# Patient Record
Sex: Female | Born: 1980
Health system: Southern US, Community
[De-identification: ages and names within clinical notes are randomized; demographics above are authoritative.]

## PROBLEM LIST (undated history)

## (undated) DIAGNOSIS — R569 Unspecified convulsions: Secondary | ICD-10-CM

## (undated) DIAGNOSIS — I10 Essential (primary) hypertension: Secondary | ICD-10-CM

## (undated) DIAGNOSIS — G8929 Other chronic pain: Secondary | ICD-10-CM

## (undated) DIAGNOSIS — E039 Hypothyroidism, unspecified: Secondary | ICD-10-CM

## (undated) DIAGNOSIS — G4730 Sleep apnea, unspecified: Secondary | ICD-10-CM

## (undated) DIAGNOSIS — M1990 Unspecified osteoarthritis, unspecified site: Secondary | ICD-10-CM

## (undated) DIAGNOSIS — R002 Palpitations: Secondary | ICD-10-CM

## (undated) DIAGNOSIS — F32A Depression, unspecified: Secondary | ICD-10-CM

## (undated) DIAGNOSIS — M25562 Pain in left knee: Secondary | ICD-10-CM

## (undated) DIAGNOSIS — Z8489 Family history of other specified conditions: Secondary | ICD-10-CM

## (undated) DIAGNOSIS — J302 Other seasonal allergic rhinitis: Secondary | ICD-10-CM

## (undated) DIAGNOSIS — Z9889 Other specified postprocedural states: Secondary | ICD-10-CM

## (undated) DIAGNOSIS — E282 Polycystic ovarian syndrome: Secondary | ICD-10-CM

## (undated) DIAGNOSIS — E119 Type 2 diabetes mellitus without complications: Secondary | ICD-10-CM

## (undated) DIAGNOSIS — K829 Disease of gallbladder, unspecified: Secondary | ICD-10-CM

## (undated) DIAGNOSIS — E559 Vitamin D deficiency, unspecified: Secondary | ICD-10-CM

## (undated) DIAGNOSIS — R112 Nausea with vomiting, unspecified: Secondary | ICD-10-CM

## (undated) DIAGNOSIS — K219 Gastro-esophageal reflux disease without esophagitis: Secondary | ICD-10-CM

## (undated) DIAGNOSIS — F419 Anxiety disorder, unspecified: Secondary | ICD-10-CM

## (undated) DIAGNOSIS — M2550 Pain in unspecified joint: Secondary | ICD-10-CM

## (undated) DIAGNOSIS — M549 Dorsalgia, unspecified: Secondary | ICD-10-CM

## (undated) DIAGNOSIS — E669 Obesity, unspecified: Secondary | ICD-10-CM

## (undated) DIAGNOSIS — F329 Major depressive disorder, single episode, unspecified: Secondary | ICD-10-CM

## (undated) DIAGNOSIS — M109 Gout, unspecified: Secondary | ICD-10-CM

## (undated) HISTORY — PX: ESOPHAGOGASTRODUODENOSCOPY: SHX1529

## (undated) HISTORY — DX: Other chronic pain: G89.29

## (undated) HISTORY — DX: Pain in left knee: M25.562

## (undated) HISTORY — PX: CHOLECYSTECTOMY: SHX55

## (undated) HISTORY — DX: Disease of gallbladder, unspecified: K82.9

## (undated) HISTORY — DX: Vitamin D deficiency, unspecified: E55.9

## (undated) HISTORY — DX: Unspecified osteoarthritis, unspecified site: M19.90

## (undated) HISTORY — DX: Pain in unspecified joint: M25.50

## (undated) HISTORY — DX: Dorsalgia, unspecified: M54.9

---

## 2012-09-10 HISTORY — PX: GANGLION CYST EXCISION: SHX1691

## 2013-08-21 DIAGNOSIS — M19072 Primary osteoarthritis, left ankle and foot: Secondary | ICD-10-CM | POA: Insufficient documentation

## 2013-09-16 DIAGNOSIS — G4700 Insomnia, unspecified: Secondary | ICD-10-CM | POA: Insufficient documentation

## 2013-11-05 DIAGNOSIS — G4721 Circadian rhythm sleep disorder, delayed sleep phase type: Secondary | ICD-10-CM | POA: Insufficient documentation

## 2013-11-30 DIAGNOSIS — G2581 Restless legs syndrome: Secondary | ICD-10-CM | POA: Insufficient documentation

## 2014-09-17 DIAGNOSIS — R1314 Dysphagia, pharyngoesophageal phase: Secondary | ICD-10-CM | POA: Insufficient documentation

## 2014-09-17 DIAGNOSIS — R140 Abdominal distension (gaseous): Secondary | ICD-10-CM | POA: Insufficient documentation

## 2014-09-17 DIAGNOSIS — R194 Change in bowel habit: Secondary | ICD-10-CM | POA: Insufficient documentation

## 2014-09-17 DIAGNOSIS — K219 Gastro-esophageal reflux disease without esophagitis: Secondary | ICD-10-CM | POA: Insufficient documentation

## 2014-09-29 DIAGNOSIS — Z3400 Encounter for supervision of normal first pregnancy, unspecified trimester: Secondary | ICD-10-CM | POA: Insufficient documentation

## 2015-05-27 DIAGNOSIS — Z3490 Encounter for supervision of normal pregnancy, unspecified, unspecified trimester: Secondary | ICD-10-CM | POA: Insufficient documentation

## 2016-09-26 DIAGNOSIS — F32A Depression, unspecified: Secondary | ICD-10-CM | POA: Insufficient documentation

## 2016-11-05 DIAGNOSIS — F509 Eating disorder, unspecified: Secondary | ICD-10-CM | POA: Insufficient documentation

## 2016-12-13 DIAGNOSIS — G4733 Obstructive sleep apnea (adult) (pediatric): Secondary | ICD-10-CM | POA: Insufficient documentation

## 2016-12-13 DIAGNOSIS — G4761 Periodic limb movement disorder: Secondary | ICD-10-CM | POA: Insufficient documentation

## 2017-03-24 ENCOUNTER — Emergency Department
Admission: EM | Admit: 2017-03-24 | Discharge: 2017-03-24 | Disposition: A | Discharge status: Home / Self Care | Payer: 59 | Source: Home / Self Care | Attending: Family Medicine | Admitting: Family Medicine

## 2017-03-24 ENCOUNTER — Encounter: Payer: Self-pay | Admitting: Emergency Medicine

## 2017-03-24 DIAGNOSIS — M79675 Pain in left toe(s): Secondary | ICD-10-CM

## 2017-03-24 HISTORY — DX: Depression, unspecified: F32.A

## 2017-03-24 HISTORY — DX: Anxiety disorder, unspecified: F41.9

## 2017-03-24 HISTORY — DX: Essential (primary) hypertension: I10

## 2017-03-24 HISTORY — DX: Obesity, unspecified: E66.9

## 2017-03-24 HISTORY — DX: Major depressive disorder, single episode, unspecified: F32.9

## 2017-03-24 HISTORY — DX: Type 2 diabetes mellitus without complications: E11.9

## 2017-03-24 MED ORDER — COLCHICINE 0.6 MG PO TABS: ORAL_TABLET | ORAL | 0 refills | Status: DC

## 2017-03-24 NOTE — Discharge Instructions (Signed)
°  You may continue to take ibuprofen (Advil or Motrin) or naproxen (Aleve) as needed for pain along with the colchicine.

## 2017-03-24 NOTE — ED Triage Notes (Signed)
Patient presents to [facility] with C/O pain and redness with slight edema in the left great toe. Since last PM, denies injury.

## 2017-03-24 NOTE — ED Provider Notes (Signed)
CSN: [id number]     Arrival date & time 03/24/17  1124 History   First MD Initiated Contact with Patient 03/24/17 1159     Chief Complaint  Patient presents with  . Toe Pain   (Consider location/radiation/quality/duration/timing/severity/associated sxs/prior Treatment) HPI  Michaela [place] is a 36 y.o. female presenting to UC with c/o sudden onset Left great toe pain that started last night with mild edema, redness, and decrease ROM.  Symptoms have gradually improved after taking ibuprofen but states pain is still present, worse with even light touch.  No prior hx of gout. She is suppose to be taking synthroid but admits to being noncompliant and feels she has not been drinking enough water recently either.  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  She is also concerned for possible stress fracture but less concerned as pain was sudden in onset and already seeming to improve.  Denies known injury. Pt is new to area, requesting resources for PCPs in the area. Denies hx of kidney or liver problems.    Past Medical History:  Diagnosis Date  . Anxiety   . Depression   . Diabetes mellitus without complication (HCC)   . Hypertension   . Obesity    Past Surgical History:  Procedure Laterality Date  . CESAREAN SECTION    . CHOLECYSTECTOMY     History reviewed. No pertinent family history. Social History  Substance Use Topics  . Smoking status: Never Smoker  . Smokeless tobacco: Never Used  . Alcohol use Yes   OB History    No data available     Review of Systems  Constitutional: Negative for chills and fever.  Musculoskeletal: Positive for arthralgias, joint swelling and myalgias.  Skin: Positive for color change. Negative for rash and wound.  Neurological: Negative for weakness and numbness.    Allergies  Sulfa antibiotics and Other  Home Medications   Prior to Admission medications   Medication Sig Start Date End Date Taking? Authorizing Provider  levothyroxine (SYNTHROID,  LEVOTHROID) 25 MCG tablet Take 25 mcg by mouth daily before breakfast.   Yes [provider]  metFORMIN (GLUCOPHAGE) 500 MG tablet Take 1,500 mg by mouth AC breakfast.   Yes [provider]  thyroid (ARMOUR) 120 MG tablet Take 105 mg by mouth daily before breakfast.   Yes [provider]  colchicine 0.6 MG tablet Take 2 tabs (1.2mg ) followed by 1 tab (0.6mg ) 1 hour later. Then 1 tab (0.6mg ) daily until pain resolves 03/24/17   Lurene Shadow, PA-C   Meds Ordered and Administered this Visit  Medications - No data to display  BP 128/80 (BP Location: Left Arm)   Pulse 66   Temp 98.4 F (36.9 C) (Oral)   Resp 18   Ht 5\' 4"  (1.626 m)   Wt 298 lb 4 oz (135.3 kg)   LMP 03/21/2017   SpO2 99%   BMI 51.19 kg/m  No data found.   Physical Exam  Constitutional: She is oriented to person, place, and time. She appears well-developed and well-nourished.  HENT:  Head: Normocephalic and atraumatic.  Eyes: EOM are normal.  Neck: Normal range of motion.  Cardiovascular: Normal rate.   Pulmonary/Chest: Effort normal.  Musculoskeletal: Normal range of motion. She exhibits edema and tenderness.  Left great toe: mild edema, full ROM. Tenderness to light touch of proximal phalanx.   Neurological: She is alert and oriented to person, place, and time.  Skin: Skin is warm and dry.  Capillary refill takes less than 2 seconds. There is erythema.  Left great toe: skin in tact. Faint erythema of toe. Nailbed appears normal.   Psychiatric: She has a normal mood and affect. Her behavior is normal.  Nursing note and vitals reviewed.   Urgent Care Course     Procedures (including critical care time)  Labs Review Labs Reviewed - No data to display  Imaging Review No results found.   MDM   1. Great toe pain, left    Hx and exam most c/w gout. Discussed labs vs symptomatic treatment prior to labs.  Pt would like to try colchicine first. If not improving in about 1 week,  will return for labs and possible x-ray.  Rx: colchicine May take ibuprofen or naproxen as well for pain Resource guide for PCPs also provided     Lurene Shadowhelps, Lloyde Ludlam O, PA-C 03/24/17 1342

## 2017-04-09 ENCOUNTER — Ambulatory Visit (INDEPENDENT_AMBULATORY_CARE_PROVIDER_SITE_OTHER): Payer: 59

## 2017-04-09 ENCOUNTER — Ambulatory Visit (INDEPENDENT_AMBULATORY_CARE_PROVIDER_SITE_OTHER): Payer: 59 | Admitting: Osteopathic Medicine

## 2017-04-09 ENCOUNTER — Encounter: Payer: Self-pay | Admitting: Osteopathic Medicine

## 2017-04-09 VITALS — BP 130/85 | HR 83 | Ht 64.0 in | Wt 294.0 lb

## 2017-04-09 DIAGNOSIS — E282 Polycystic ovarian syndrome: Secondary | ICD-10-CM | POA: Diagnosis not present

## 2017-04-09 DIAGNOSIS — M25562 Pain in left knee: Secondary | ICD-10-CM

## 2017-04-09 DIAGNOSIS — F419 Anxiety disorder, unspecified: Secondary | ICD-10-CM

## 2017-04-09 DIAGNOSIS — K2100 Gastro-esophageal reflux disease with esophagitis, without bleeding: Secondary | ICD-10-CM | POA: Insufficient documentation

## 2017-04-09 DIAGNOSIS — E039 Hypothyroidism, unspecified: Secondary | ICD-10-CM

## 2017-04-09 DIAGNOSIS — G8929 Other chronic pain: Secondary | ICD-10-CM

## 2017-04-09 DIAGNOSIS — Z8669 Personal history of other diseases of the nervous system and sense organs: Secondary | ICD-10-CM | POA: Diagnosis not present

## 2017-04-09 MED ORDER — OMEPRAZOLE 40 MG PO CPDR: 40.0000 mg | DELAYED_RELEASE_CAPSULE | Freq: Every day | ORAL | 3 refills | Status: DC

## 2017-04-09 MED ORDER — CLONAZEPAM 0.5 MG PO TABS: 0.2500 mg | ORAL_TABLET | Freq: Two times a day (BID) | ORAL | 0 refills | Status: DC | PRN

## 2017-04-09 NOTE — Progress Notes (Signed)
HPI: Michaela Holland is a 36 y.o. female  who presents to [facility] today, 04/09/17,  for chief complaint of:  Chief Complaint  Patient presents with  . Establish Care  . Referral    womens, behavioral, sleep med, & endocrin    New patient here to establish care. Has several chronic issues to address and like to establish with specialists. Recently moved to the area, no records available currently.  Sleep medicine - recent diagnosis OSA just prior to moving, has not started yet on CPAP.  Anxiety - would like referral to behavioral health for anxiety issues. The only medication she is taking right now is clonazepam, prescription is written for 3 times a day as needed, she typically takes this once or twice per day. Clonazepam Rx - no data in [facility]  Endocrine - PCOS and Thyroid issues - currently taking metformin and levothyroxine as well as Armour Thyroid. Would like referral to endocrinology And to OB/GYN  GI: History of GERD, recent EGD was concerning for possible esophagitis, she states she would like to follow-up on this. Currently on PPI therapy as noted below. Requests referral to GI  Knee pain - new issue . Location: Deep in left knee . Quality: Soreness, occasional sharp pain . Duration: Has been ongoing on and off for many years, seems worse over the past few months . Context: Previous injuries from falls, sports injury from basketball as a teenager. 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 Assoc signs/symptoms: no instability or fall     Past medical, surgical, social and family history reviewed: There are no active problems to display for this patient.  Past Surgical History:  Procedure Laterality Date  . CESAREAN SECTION    . CHOLECYSTECTOMY     Social History  Substance Use Topics  . Smoking status: Never Smoker  . Smokeless tobacco: Never Used  . Alcohol use Yes   No family  history on file.   Current medication list and allergy/intolerance information reviewed:   Current Outpatient Prescriptions  Medication Sig Dispense Refill  . colchicine 0.6 MG tablet Take 2 tabs (1.2mg ) followed by 1 tab (0.6mg ) 1 hour later. Then 1 tab (0.6mg ) daily until pain resolves 8 tablet 0  . levothyroxine (SYNTHROID, LEVOTHROID) 25 MCG tablet Take 25 mcg by mouth daily before breakfast.    . metFORMIN (GLUCOPHAGE) 500 MG tablet Take 1,500 mg by mouth AC breakfast.    . thyroid (ARMOUR) 120 MG tablet Take 105 mg by mouth daily before breakfast.     No current facility-administered medications for this visit.    Allergies  Allergen Reactions  . Sulfa Antibiotics Hives and Shortness Of Breath  . Other     Nuts severe reaction difficulty breathing , throat swelling      Review of Systems:  Constitutional:  No  fever, no chills, No recent illness, +unintentional weight Gain. No significant fatigue.   HEENT: No  headache, no vision change, no hearing change, No sore throat, No  sinus pressure  Cardiac: No  chest pain, No  pressure, No palpitations,   Respiratory:  No  shortness of breath. No  Cough  Gastrointestinal: No  abdominal pain, No  nausea, No  vomiting,  No  blood in stool, No  diarrhea, No  Constipation, +GERD  Musculoskeletal: No new myalgia/arthralgia  Genitourinary: No  incontinence, No  abnormal genital bleeding, No abnormal  genital discharge  Skin: No  Rash  Hem/Onc: No  easy bruising/bleeding, No  abnormal lymph node  Endocrine: No cold intolerance,  No heat intolerance. No polyuria/polydipsia/polyphagia   Neurologic: No  weakness, No  dizziness,   Psychiatric: No  concerns with depression, No  concerns with anxiety, No sleep problems, No mood problems  Exam:  BP 130/85   Pulse 83   Ht 5\' 4"  (1.626 m)   Wt 294 lb (133.4 kg)   LMP 03/21/2017   BMI 50.46 kg/m   Constitutional: VS see above. General Appearance: alert, well-developed,  well-nourished, NAD  Eyes: Normal lids and conjunctive, non-icteric sclera  Ears, Nose, Mouth, Throat: MMM, Normal external inspection ears/nares/mouth/lips/gums. TM normal bilaterally. Pharynx/tonsils no erythema, no exudate. Nasal mucosa normal.   Neck: No masses, trachea midline. No thyroid enlargement.   Respiratory: Normal respiratory effort. no wheeze, no rhonchi, no rales  Cardiovascular: S1/S2 normal, no murmur, no rub/gallop auscultated. RRR. No lower extremity edema.  Musculoskeletal: Gait normal. No clubbing/cyanosis of digits. Mild crepitus to left knee, no instability on anterior/posterior drawer, varus/valgus, or McMurray test, no increased patellar glide  Neurological: Normal balance/coordination. No tremor.   Skin: warm, dry, intact.  Psychiatric: Normal judgment/insight. Normal mood and affect. Oriented x3.    Dg Knee Complete 4 Views Left  Result Date: 04/09/2017 CLINICAL DATA:  Left knee pain for 4 weeks EXAM: LEFT KNEE - COMPLETE 4+ VIEW COMPARISON:  None. FINDINGS: No acute bony abnormality. Specifically, no fracture, subluxation, or dislocation. Soft tissues are intact. No joint effusion. IMPRESSION: No acute bony abnormality. Electronically Signed   By: Charlett NoseKevin  Dover M.D.   On: 04/09/2017 10:36     ASSESSMENT/PLAN:   History of obstructive sleep apnea - Plan: Ambulatory referral to Sleep Studies  Hypothyroidism, unspecified type - Plan: Ambulatory referral to Endocrinology  PCOS (polycystic ovarian syndrome) - Plan: Ambulatory referral to Obstetrics / Gynecology, Ambulatory referral to Endocrinology  Anxiety - Refill of clonazepam for now, defer further management to psychiatry - Plan: Ambulatory referral to Behavioral Health, clonazePAM (KLONOPIN) 0.5 MG tablet  Chronic pain of left knee - Recommend physical therapy and follow-up with sports medicine if no improvement. Weight loss was also discussed. - Plan: DG Knee Complete 4 Views Left, Ambulatory  referral to Physical Therapy  Gastroesophageal reflux disease with esophagitis - Plan: omeprazole (PRILOSEC) 40 MG capsule, Ambulatory referral to Gastroenterology   Request records from previous PCP/specialists   Visit summary with medication list and pertinent instructions was printed for patient to review. All questions at time of visit were answered - patient instructed to contact office with any additional concerns. ER/RTC precautions were reviewed with the patient. Follow-up plan: Return for ANNUAL PHYSICAL WHEN DUE .

## 2017-04-17 ENCOUNTER — Encounter: Payer: Self-pay | Admitting: Physical Therapy

## 2017-04-17 ENCOUNTER — Ambulatory Visit (INDEPENDENT_AMBULATORY_CARE_PROVIDER_SITE_OTHER): Payer: 59 | Admitting: Physical Therapy

## 2017-04-17 DIAGNOSIS — M6281 Muscle weakness (generalized): Secondary | ICD-10-CM

## 2017-04-17 DIAGNOSIS — G8929 Other chronic pain: Secondary | ICD-10-CM

## 2017-04-17 DIAGNOSIS — R293 Abnormal posture: Secondary | ICD-10-CM | POA: Diagnosis not present

## 2017-04-17 DIAGNOSIS — M25562 Pain in left knee: Secondary | ICD-10-CM | POA: Diagnosis not present

## 2017-04-17 DIAGNOSIS — R2689 Other abnormalities of gait and mobility: Secondary | ICD-10-CM | POA: Diagnosis not present

## 2017-04-18 ENCOUNTER — Encounter: Payer: Self-pay | Admitting: Osteopathic Medicine

## 2017-04-18 NOTE — Patient Instructions (Signed)
[facility]  Quad sets SLR Lt with ER Prone quad stretch S/L hip abduction

## 2017-04-18 NOTE — Therapy (Signed)
[facility] [address] [place], [place], [address] Phone: [phone]   Fax:  [phone]  Physical Therapy Evaluation  Patient Details  Name: Michaela Holland MRN: [id number] Date of Birth: December 19, 1980 Referring Provider: Dr Sunnie Nielsen  Encounter Date: 04/17/2017      PT End of Session - 04/17/17 1417    Visit Number 1   Number of Visits 12   Date for PT Re-Evaluation 05/30/17   PT Start Time 1417   PT Stop Time 1514   PT Time Calculation (min) 57 min   Activity Tolerance Patient tolerated treatment well      Past Medical History:  Diagnosis Date  . Anxiety   . Depression   . Diabetes mellitus without complication (HCC)   . Hypertension   . Obesity     Past Surgical History:  Procedure Laterality Date  . CESAREAN SECTION    . CHOLECYSTECTOMY      There were no vitals filed for this visit.       Subjective Assessment - 04/17/17 1418    Subjective Pt reports she is new to the area, she has a h/o knee pain however it has flared up a couple months ago.  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    How long can you walk comfortably? immediate pain   Diagnostic tests x-ray (-)    Patient Stated Goals find out what is causing the pain and fix it.    Currently in Pain? Yes   Pain Score 3   varies from 1-8/10 during the day.    Pain Location Knee   Pain Orientation Left;Medial;Distal  and posterior   Pain Type Chronic pain   Pain Onset More than a month ago   Pain Frequency Constant   Aggravating Factors  all weight bearing, now interfering with sleep, bending knee   Pain Relieving Factors advil            [facility] PT Assessment - 04/18/17 1149      Assessment   Medical Diagnosis Lt  knee pain   Referring Provider Dr Sunnie Nielsen   Onset Date/Surgical Date 01/15/17   Hand Dominance Right   Next MD Visit PRN   Prior Therapy none     Precautions   Precautions None     Home Environment   Living Environment Private residence   Living Arrangements Children;Spouse/significant other   Home Layout One level  apartment     Prior Function   Level of Independence Independent   Vocation Full time employment   Freight forwarder in a [place]   Leisure play with daughter, walk with her     Observation/Other Assessments   Focus on Therapeutic Outcomes (FOTO)  70% limited     Functional Tests   Functional tests Squat;Step down;Single leg stance     Squat   Comments knees came in front of toes     Single Leg Stance   Comments Lt 2 sec, Rt 7 secs     Posture/Postural Control   Posture/Postural Control Postural limitations   Postural Limitations --  obesity, pes planus     Strength   Strength Assessment Site Hip;Knee;Ankle  Right/Left Hip Left;Right   Right Hip Flexion 5/5   Right Hip Extension 4+/5   Right Hip ABduction 4+/5   Left Hip Extension 5/5   Left Hip External Rotation 4-/5   Left Hip ABduction 4/5   Left Knee Flexion 4-/5  pain posterior knee and distal   Left Knee Extension 5/5     Flexibility   Soft Tissue Assessment /Muscle Length yes   Hamstrings WNL   Quadriceps prone knee flexion tight on Lt side as compared to Rt     Ambulation/Gait   Ambulation/Gait Yes   Ambulation/Gait Assistance 7: Independent   Ambulation Distance (Feet) --  observed in clinic   Assistive device None   Gait Pattern Decreased stance time - left;Antalgic;Lateral hip instability;Wide base of support   Gait velocity decreased            Objective measurements completed on examination: See above findings.          [facility] Adult PT Treatment/Exercise - 04/18/17 1149      Exercises   Exercises Knee/Hip     Knee/Hip Exercises: Stretches    Quad Stretch Both;1 rep;30 seconds  prone with strap     Knee/Hip Exercises: Supine   Quad Sets Strengthening;Left;1 set;10 reps  5 secs   Straight Leg Raise with External Rotation Strengthening;Left;2 sets;10 reps     Knee/Hip Exercises: Sidelying   Hip ABduction Strengthening;Both;2 sets;10 reps     Manual Therapy   Manual Therapy Taping   Kinesiotex Facilitate Muscle  dynamic tape to Lt knee to correct lateral tracking                PT Education - 04/18/17 1156    Education provided Yes   Education Details HEP   Person(s) Educated Patient   Methods Explanation;Demonstration;Handout   Comprehension Returned demonstration;Verbalized understanding             PT Long Term Goals - 04/18/17 1205      PT LONG TERM GOAL #1   Title I with advanced HEP (05/30/17)    Time 6   Period Weeks   Status New     PT LONG TERM GOAL #2   Title demo Lt knee and hip strength =/> 5-/5 to take pressure off knee ( 05/30/17)    Time 6   Period Weeks   Status New     PT LONG TERM GOAL #3   Title report =/> 75% reduction in Lt knee pain ( 05/30/17)    Time 6   Period Weeks   Status New     PT LONG TERM GOAL #4   Title demo normalized gait pattern on even surfaces ( 05/30/17)    Time 6   Period Weeks   Status New     PT LONG TERM GOAL #5   Title improve FOTO =/< 47% limited ( 05/30/17)    Time 6   Period Weeks   Status New                Plan - 04/18/17 1156    Clinical Impression Statement 36 yo female presents with c/o Lt knee pain that has gotten worse since moving to this area and taking a job where she is on her feet a lot more.  She has limited Lt Knee ROM, decreased lower body strength, gait abnormalities, tenderness in the Lt LE muscles and decreased proprioception.  She is morbidly obese and has DM.  Linzee would benefit from orthotics as  she has bilat LE valgus and pes planus.  Referred to sports medicine MD for this. Pt was evaluated 8/818,  documentation completed 04/18/17 due to system being down.    Clinical Presentation Evolving   Clinical Decision Making Low   Rehab Potential Good   PT Frequency 2x / week   PT Duration 6 weeks   PT Treatment/Interventions Moist Heat;Ultrasound;Therapeutic exercise;Dry needling;Taping;Vasopneumatic Device;Manual techniques;Neuromuscular re-education;Cryotherapy;Electrical Stimulation;Iontophoresis 4mg /ml Dexamethasone;Patient/family education   PT Next Visit Plan assess response to dynamic tape for lateral tracking and HEP.  Progress LE strengthening   Consulted and Agree with Plan of Care Patient      Patient will benefit from skilled therapeutic intervention in order to improve the following deficits and impairments:  Abnormal gait, Decreased range of motion, Increased muscle spasms, Pain, Obesity, Decreased strength  Visit Diagnosis: Chronic pain of left knee - Plan: PT plan of care cert/re-cert  Muscle weakness (generalized) - Plan: PT plan of care cert/re-cert  Abnormal posture - Plan: PT plan of care cert/re-cert  Other abnormalities of gait and mobility - Plan: PT plan of care cert/re-cert     Problem List Patient Active Problem List   Diagnosis Date Noted  . History of obstructive sleep apnea 04/09/2017  . Hypothyroidism 04/09/2017  . PCOS (polycystic ovarian syndrome) 04/09/2017  . Anxiety 04/09/2017  . Chronic pain of left knee 04/09/2017  . Gastroesophageal reflux disease with esophagitis 04/09/2017    Roderic ScarceSusan Noele Icenhour PT  04/18/2017, 12:12 PM  [facility] [address] [place], [place], [address] Phone: [phone]   Fax:  [phone]  Name: Garald Baldingnne K Hight MRN: [id number] Date of Birth: September 24, 1980

## 2017-04-23 ENCOUNTER — Encounter: Payer: Self-pay | Admitting: Family Medicine

## 2017-04-23 ENCOUNTER — Ambulatory Visit (INDEPENDENT_AMBULATORY_CARE_PROVIDER_SITE_OTHER): Payer: 59 | Admitting: Family Medicine

## 2017-04-23 ENCOUNTER — Ambulatory Visit (INDEPENDENT_AMBULATORY_CARE_PROVIDER_SITE_OTHER): Payer: 59 | Admitting: Physical Therapy

## 2017-04-23 VITALS — BP 126/84 | HR 76 | Wt 299.0 lb

## 2017-04-23 DIAGNOSIS — G8929 Other chronic pain: Secondary | ICD-10-CM

## 2017-04-23 DIAGNOSIS — M6281 Muscle weakness (generalized): Secondary | ICD-10-CM

## 2017-04-23 DIAGNOSIS — M25562 Pain in left knee: Secondary | ICD-10-CM

## 2017-04-23 DIAGNOSIS — R2689 Other abnormalities of gait and mobility: Secondary | ICD-10-CM

## 2017-04-23 DIAGNOSIS — R293 Abnormal posture: Secondary | ICD-10-CM | POA: Diagnosis not present

## 2017-04-23 MED ORDER — DICLOFENAC SODIUM 1 % TD GEL: 4.0000 g | Freq: Four times a day (QID) | TRANSDERMAL | 11 refills | Status: DC

## 2017-04-23 NOTE — Patient Instructions (Signed)
Short Arc [employer] CompanyQuad    Place a large can or rolled towel under left leg. Straighten leg. Hold _10___ seconds. Repeat __5-10__ times. Do _2-3___ sessions per day.  Straight Leg Raise: With External Leg Rotation    Lie on back with left leg straight, opposite leg bent. Rotate straight leg out and lift __8__ inches. Repeat __10__ times per set. Do ____ sets per session. Do ____ sessions per day.   Abduction: Side Leg Lift (Eccentric) - Side-Lying    Lie on side. Lift top leg slightly higher than shoulder level. Keep top leg straight with body, toes pointing forward. Slowly lower for 3-5 seconds. _10__ reps per set, __2_ sets per day, _3-4__ days per week.  Hamstring Step 1    Straighten knee with foot in strap. Hold __30_ seconds. Relax knee by returning foot to start. Repeat __3_ times.  Calf Stretch    Place hands on wall at shoulder height. Keeping back leg straight, bend front leg, feet pointing forward, heels flat on floor. Lean forward slightly until stretch is felt in calf of back leg. Hold stretch _30__ seconds, breathing slowly in and out. Repeat stretch with other leg back. Do _2__ sessions per day. Variation: Use chair or table for support.  [facility] [address] [place], [place] [address]  [phone] (office) [phone] (fax)

## 2017-04-23 NOTE — Patient Instructions (Signed)
Thank you for coming in today. Take ibuprofen for pain as needed.  Use voltaren gel for pain as needed 4x daily.  Recheck with me a few days after MRI. Continue exercises per PT.   Weight loss will help a lot.    Patellofemoral Pain Syndrome Patellofemoral pain syndrome is a condition that involves a softening or breakdown of the tissue (cartilage) on the underside of your kneecap (patella). This causes pain in the front of the knee. The condition is also called runner's knee or chondromalacia patella. Patellofemoral pain syndrome is most common in young adults who are active in sports. Your knee is the largest joint in your body. The patella covers the front of your knee and is attached to muscles above and below your knee. The underside of the patella is covered with a smooth type of cartilage (synovium). The smooth surface helps the patella glide easily when you move your knee. Patellofemoral pain syndrome causes swelling in the joint linings and bone surfaces in your knee. What are the causes? Patellofemoral pain syndrome can be caused by:  Overuse.  Poor alignment of your knee joints.  Weak leg muscles.  A direct blow to your kneecap.  What increases the risk? You may be at risk for patellofemoral pain syndrome if you:  Do a lot of activities that can wear down your kneecap. These include: ? Running. ? Squatting. ? Climbing stairs.  Start a new physical activity or exercise program.  Wear shoes that do not fit well.  Do not have good leg strength.  Are overweight.  What are the signs or symptoms? Knee pain is the most common symptom of patellofemoral pain syndrome. This may feel like a dull, aching pain underneath your patella, in the front of your knee. There may be a popping or cracking sound when you move your knee. Pain may get worse with:  Exercise.  Climbing stairs.  Running.  Jumping.  Squatting.  Kneeling.  Sitting for a long time.  Moving or  pushing on your patella.  How is this diagnosed? Your health care provider may be able to diagnose patellofemoral pain syndrome from your symptoms and medical history. You may be asked about your recent physical activities and which ones cause knee pain. Your health care provider may do a physical exam with certain tests to confirm the diagnosis. These may include:  Moving your patella back and forth.  Checking your range of knee motion.  Having you squat or jump to see if you have pain.  Checking the strength of your leg muscles.  An MRI of the knee may also be done. How is this treated? Patellofemoral pain syndrome can usually be treated at home with rest, ice, compression, and elevation (RICE). Other treatments may include:  Nonsteroidal anti-inflammatory drugs (NSAIDs).  Physical therapy to stretch and strengthen your leg muscles.  Shoe inserts (orthotics) to take stress off your knee.  A knee brace or knee support.  Surgery to remove damaged cartilage or move the patella to a better position. The need for surgery is rare.  Follow these instructions at home:  Take medicines only as directed by your health care provider.  Rest your knee. ? When resting, keep your knee raised above the level of your heart. ? Avoid activities that cause knee pain.  Apply ice to the injured area: ? Put ice in a plastic bag. ? Place a towel between your skin and the bag. ? Leave the ice on for 20 minutes, 2-3  times a day.  Use splints, braces, knee supports, or walking aids as directed by your health care provider.  Perform stretching and strengthening exercises as directed by your health care provider or physical therapist.  Keep all follow-up visits as directed by your health care provider. This is important. Contact a health care provider if:  Your symptoms get worse.  You are not improving with home care. This information is not intended to replace advice given to you by your  health care provider. Make sure you discuss any questions you have with your health care provider. Document Released: 08/15/2009 Document Revised: 02/02/2016 Document Reviewed: 11/16/2013 Elsevier Interactive Patient Education  2018 [employer].

## 2017-04-23 NOTE — Therapy (Signed)
[facility] [place] [address] [place], [place], [address] Phone: [phone]   Fax:  [phone]  Physical Therapy Treatment  Patient Details  Name: Michaela Holland MRN: [id number] Date of Birth: 02-Feb-1981 Referring Provider: Dr. Sunnie Nielsen  Encounter Date: 04/23/2017      PT End of Session - 04/23/17 1155    Visit Number 2   Number of Visits 12   Date for PT Re-Evaluation 05/30/17   PT Start Time 1155  pt arrived late   PT Stop Time 1250   PT Time Calculation (min) 55 min - icepack last 12 min   Activity Tolerance Patient tolerated treatment well   Behavior During Therapy [facility] for tasks assessed/performed      Past Medical History:  Diagnosis Date  . Anxiety   . Depression   . Diabetes mellitus without complication (HCC)   . Hypertension   . Obesity     Past Surgical History:  Procedure Laterality Date  . CESAREAN SECTION    . CHOLECYSTECTOMY      There were no vitals filed for this visit.      Subjective Assessment - 04/23/17 1200    Subjective Pt reports she continues to wake from pain in Lt knee.     Currently in Pain? Yes   Pain Score 4    Pain Location Knee   Pain Orientation Left   Pain Descriptors / Indicators Tightness;Throbbing;Sore   Aggravating Factors  all weight  bearing   Pain Relieving Factors OTC meds            [facility] PT Assessment - 04/23/17 0001      Assessment   Medical Diagnosis Lt knee pain   Referring Provider Dr. Sunnie Nielsen   Onset Date/Surgical Date 01/15/17   Hand Dominance Right   Next MD Visit PRN   Prior Therapy none     Flexibility   Hamstrings Lt 72 deg; Rt 90 deg   Quadriceps Lt knee 110 deg, Rt 123 deg          [facility] Adult PT Treatment/Exercise - 04/23/17 0001      Knee/Hip Exercises: Stretches   Passive Hamstring Stretch Right;Left;2 reps  45 sec   Passive Hamstring Stretch Limitations trial of seated and standing; had discomfort in LB -  encouraged pt to stretch in supine only.    Quad Stretch 30 seconds;3 reps;Right;Left  prone with strap   Quad Stretch Limitations towel above knee    Hip Flexor Stretch Right;Left;2 reps;20 seconds   Hip Flexor Stretch Limitations some discomfort in LB.    Gastroc Stretch Right;Left;2 reps;30 seconds     Knee/Hip Exercises: Supine   Quad Sets Strengthening;Left;1 set;5 reps  5 sec hold   Short Arc Quad Sets Strengthening;Left;1 set;10 reps  5 sec hold   Straight Leg Raises Left;1 set;10 reps   Straight Leg Raises Limitations VC to slow speed   Straight Leg Raise with External Rotation Left;1 set  8 reps, early fatigue     Knee/Hip Exercises: Sidelying   Hip ABduction Strengthening;Right;Left;1 set;15 reps     Modalities   Modalities Cryotherapy     Cryotherapy   Number Minutes Cryotherapy 12 Minutes   Cryotherapy Location Knee  Lt   Type of Cryotherapy Ice pack     Manual Therapy   Manual Therapy --   Manual therapy comments tape held; pt seeing MD today.    Kinesiotex --  PT Education - 04/23/17 1220    Education provided Yes   Education Details HEP   Person(s) Educated Patient   Methods Explanation   Comprehension Verbalized understanding;Returned demonstration             PT Long Term Goals - 04/23/17 1253      PT LONG TERM GOAL #1   Title I with advanced HEP (05/30/17)    Time 6   Period Weeks   Status On-going     PT LONG TERM GOAL #2   Title demo Lt knee and hip strength =/> 5-/5 to take pressure off knee ( 05/30/17)    Time 6   Period Weeks   Status On-going     PT LONG TERM GOAL #3   Title report =/> 75% reduction in Lt knee pain ( 05/30/17)    Time 6   Period Weeks   Status On-going     PT LONG TERM GOAL #4   Title demo normalized gait pattern on even surfaces ( 05/30/17)    Time 6   Period Weeks   Status On-going     PT LONG TERM GOAL #5   Title improve FOTO =/< 47% limited ( 05/30/17)    Time 6   Period Weeks    Status On-going               Plan - 04/23/17 1243    Clinical Impression Statement Pt had positive response to dynamic tape application to Lt knee; noticeable pain relief. Tape held due to appointment with Dr. Denyse Amassorey; will apply at next visit.   She had improved tolerance with SAQ with hold versus quad sets.  Modifications were made to exercises to avoid increased back discomfort.   Lt hamstring/quads tight compared to RLE; added stretches to HEP.  Progressing towards goals.    Rehab Potential Good   PT Frequency 2x / week   PT Duration 6 weeks   PT Treatment/Interventions Moist Heat;Ultrasound;Therapeutic exercise;Dry needling;Taping;Vasopneumatic Device;Manual techniques;Neuromuscular re-education;Cryotherapy;Electrical Stimulation;Iontophoresis 4mg /ml Dexamethasone;Patient/family education   PT Next Visit Plan Reapply dynamic tape for lateral tracking.  Continue progressive stretching /strengthening LLE.    Consulted and Agree with Plan of Care Patient      Patient will benefit from skilled therapeutic intervention in order to improve the following deficits and impairments:  Abnormal gait, Decreased range of motion, Increased muscle spasms, Pain, Obesity, Decreased strength  Visit Diagnosis: Chronic pain of left knee  Muscle weakness (generalized)  Abnormal posture  Other abnormalities of gait and mobility     Problem List Patient Active Problem List   Diagnosis Date Noted  . History of obstructive sleep apnea 04/09/2017  . Hypothyroidism 04/09/2017  . PCOS (polycystic ovarian syndrome) 04/09/2017  . Anxiety 04/09/2017  . Chronic pain of left knee 04/09/2017  . Gastroesophageal reflux disease with esophagitis 04/09/2017   Mayer CamelJennifer Carlson-Long, PTA 04/23/17 12:59 PM  [facility] [address] [place], [place], [address] Phone: [phone]   Fax:  [phone]  Name: Michaela Holland MRN: [id number] Date  of Birth: June 24, 1981

## 2017-04-23 NOTE — Progress Notes (Addendum)
Subjective:    I'm seeing this patient as a consultation for:  Michaela Nielsen, DO   CC: Left Knee Pain  HPI: Patient has a several year history of left anterior knee pain. Her pain has been worsened over the past several months after a twisting injury. She notes the pain has been worsening recently as well. 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 This job requires lots of walking which seems to exacerbate her pain. She takes ibuprofen or Tylenol most days. She was seen by her primary care provider recently who obtained x-rays which were unremarkable as well as referred to physical therapy. The physical therapy has been somewhat helpful. Her knee pain is complicated by morbid obesity. She knows that she needs to lose weight but has struggled to lose weight in the past.  Past medical history, Surgical history, Family history not pertinant except as noted below, Social history, Allergies, and medications have been entered into the medical record, reviewed, and no changes needed.   Review of Systems: No headache, visual changes, nausea, vomiting, diarrhea, constipation, dizziness, abdominal pain, skin rash, fevers, chills, night sweats, weight loss, swollen lymph nodes, body aches, joint swelling, muscle aches, chest pain, shortness of breath, mood changes, visual or auditory hallucinations.   Objective:    Vitals:   04/23/17 1326  BP: 126/84  Pulse: 76  Body mass index is 51.32 kg/m.  General: Morbidly obese, well nourished, and in no acute distress.  Neuro/Psych: Alert and oriented x3, extra-ocular muscles intact, able to move all 4 extremities, sensation grossly intact. Skin: Warm and dry, no rashes noted.  Respiratory: Not using accessory muscles, speaking in full sentences, trachea midline.  Cardiovascular: Pulses palpable, no extremity edema. Abdomen: Does not appear distended. MSK: Left knee obese difficult to tell effusion based on body habitus. Range of  motion 0-120 with no significant crepitations. Tender to palpation anterior knee along the patella tendon with palpable squeak. Additionally tender to palpation at the medial joint line. Negative anterior posterior drawer test. Negative valgus and varus stress test. Mildly positive lateral McMurray's test negative medial Antalgic gait is present  Study Result   CLINICAL DATA:  Left knee pain for 4 weeks  EXAM: LEFT KNEE - COMPLETE 4+ VIEW  COMPARISON:  None.  FINDINGS: No acute bony abnormality. Specifically, no fracture, subluxation, or dislocation. Soft tissues are intact. No joint effusion.  IMPRESSION: No acute bony abnormality.   Electronically Signed   By: Charlett Nose M.D.   On: 04/09/2017 10:36     Impression and Recommendations:    Assessment and Plan: 36 y.o. female with Left knee pain. Likely multifactorial. Patient certainly has a component of the patellar bursitis based on tenderness at the patella tendon assisted with palpable squeak. Additionally I believe she has patellofemoral pain syndrome. Lastly I'm concerned she has a meniscus injury. She has a positive McMurray exam test as well as is tender to palpation along the medial joint line. She has failed to improve despite adequate conservative management for over 6 weeks. Plan to obtain an MRI to evaluate for meniscus injury. Additionally recommend treatment with topical diclofenac gel. I also agree with physical therapy.  I spent time talking with the patient regarding her potential diagnosis as well as treatment options including weight loss and quadriceps strengthening. I recommended an exercise bike.  Recheck following MRI.   Orders Placed This Encounter  Procedures  . MR Knee Left  Wo Contrast    Standing Status:  Future    Standing Expiration Date:   06/23/2018    Order Specific Question:   What is the patient's sedation requirement?    Answer:   No Sedation    Order Specific Question:    Does the patient have a pacemaker or implanted devices?    Answer:   No    Order Specific Question:   Preferred [facility]?    [facility]    Order Specific Question:   Radiology Contrast Protocol - do NOT remove file path    Answer:   \\[email or web address]   Meds ordered this encounter  Medications  . diclofenac sodium (VOLTAREN) 1 % GEL    Sig: Apply 4 g topically 4 (four) times daily. To affected joint.    Dispense:  100 g    Refill:  11    Discussed warning signs or symptoms. Please see discharge instructions. Patient expresses understanding.

## 2017-04-25 ENCOUNTER — Encounter: Payer: 59 | Admitting: Physical Therapy

## 2017-04-25 ENCOUNTER — Ambulatory Visit (INDEPENDENT_AMBULATORY_CARE_PROVIDER_SITE_OTHER): Payer: 59 | Admitting: Physical Therapy

## 2017-04-25 DIAGNOSIS — R293 Abnormal posture: Secondary | ICD-10-CM

## 2017-04-25 DIAGNOSIS — M6281 Muscle weakness (generalized): Secondary | ICD-10-CM

## 2017-04-25 DIAGNOSIS — M25562 Pain in left knee: Secondary | ICD-10-CM

## 2017-04-25 DIAGNOSIS — G8929 Other chronic pain: Secondary | ICD-10-CM

## 2017-04-25 NOTE — Therapy (Signed)
[facility] [address] [place], [place], [address] Phone: [phone]   Fax:  [phone]  Physical Therapy Treatment  Patient Details  Name: Michaela Holland MRN: [id number] Date of Birth: 26-Apr-1981 Referring Provider: Dr. Sunnie NielsenNatalie Alexander  Encounter Date: 04/25/2017      PT End of Session - 04/25/17 1737    PT Start Time 1530   PT Stop Time 1535   PT Time Calculation (min) 5 min      Past Medical History:  Diagnosis Date  . Anxiety   . Depression   . Diabetes mellitus without complication (HCC)   . Hypertension   . Obesity     Past Surgical History:  Procedure Laterality Date  . CESAREAN SECTION    . CHOLECYSTECTOMY      There were no vitals filed for this visit.      Subjective Assessment - 04/25/17 1734    Subjective Pt reports she has been in pain since prior to last visit.  She is unable to stay for full therapy session due to another appointment.  She is requesting to have her Lt knee taped today since she had some pain relief with last application.    Currently in Pain? Yes   Pain Score 5    Pain Location Knee   Pain Orientation Left   Pain Descriptors / Indicators Throbbing;Tightness   Aggravating Factors  all weight bearing    Pain Relieving Factors OTC meds, tape on knee            [facility] PT Assessment - 04/25/17 0001      Assessment   Medical Diagnosis Lt knee pain   Referring Provider Dr. Sunnie NielsenNatalie Alexander   Onset Date/Surgical Date 01/15/17   Hand Dominance Right   Next MD Visit PRN                     [facility] Treatment/Exercise - 04/25/17 0001      Manual Therapy   Manual therapy comments K shape placed around Lt knee (lateral border) and perpendicular strip to pull patella more medially.    Kinesiotex Facilitate Muscle  dynamic tape to Lt knee to correct lateral tracking                     PT Long Term Goals - 04/23/17 1253      PT  LONG TERM GOAL #1   Title I with advanced HEP (05/30/17)    Time 6   Period Weeks   Status On-going     PT LONG TERM GOAL #2   Title demo Lt knee and hip strength =/> 5-/5 to take pressure off knee ( 05/30/17)    Time 6   Period Weeks   Status On-going     PT LONG TERM GOAL #3   Title report =/> 75% reduction in Lt knee pain ( 05/30/17)    Time 6   Period Weeks   Status On-going     PT LONG TERM GOAL #4   Title demo normalized gait pattern on even surfaces ( 05/30/17)    Time 6   Period Weeks   Status On-going     PT LONG TERM GOAL #5   Title improve FOTO =/< 47% limited ( 05/30/17)    Time 6   Period Weeks   Status On-going               Plan - 04/25/17 1739  Clinical Impression Statement Pt unable to stay for therapy session but tape was applied to Lt knee.  Pt was informed this was performed as a courtesy, but next visit will have to be full session which may include instruction for self - application.    Rehab Potential Good   PT Frequency 2x / week   PT Duration 6 weeks   PT Treatment/Interventions Moist Heat;Ultrasound;Therapeutic exercise;Dry needling;Taping;Vasopneumatic Device;Manual techniques;Neuromuscular re-education;Cryotherapy;Electrical Stimulation;Iontophoresis 4mg /ml Dexamethasone;Patient/family education   Consulted and Agree with Plan of Care Patient      Patient will benefit from skilled therapeutic intervention in order to improve the following deficits and impairments:  Abnormal gait, Decreased range of motion, Increased muscle spasms, Pain, Obesity, Decreased strength  Visit Diagnosis: Chronic pain of left knee  Muscle weakness (generalized)  Abnormal posture     Problem List Patient Active Problem List   Diagnosis Date Noted  . History of obstructive sleep apnea 04/09/2017  . Hypothyroidism 04/09/2017  . PCOS (polycystic ovarian syndrome) 04/09/2017  . Anxiety 04/09/2017  . Chronic pain of left knee 04/09/2017  .  Gastroesophageal reflux disease with esophagitis 04/09/2017   Mayer Camel, PTA 04/25/17 5:42 PM  [facility] [address] [place], [place], [address] Phone: [phone]   Fax:  [phone]  Name: Michaela Holland MRN: [id number] Date of Birth: Jan 25, 1981

## 2017-05-03 ENCOUNTER — Ambulatory Visit (INDEPENDENT_AMBULATORY_CARE_PROVIDER_SITE_OTHER): Payer: 59 | Admitting: Physical Therapy

## 2017-05-03 DIAGNOSIS — M25562 Pain in left knee: Secondary | ICD-10-CM | POA: Diagnosis not present

## 2017-05-03 DIAGNOSIS — M6281 Muscle weakness (generalized): Secondary | ICD-10-CM | POA: Diagnosis not present

## 2017-05-03 DIAGNOSIS — G8929 Other chronic pain: Secondary | ICD-10-CM | POA: Diagnosis not present

## 2017-05-03 NOTE — Therapy (Signed)
[facility] [place] [address] [place], [place], [address] Phone: [phone]   Fax:  [phone]  Physical Therapy Treatment  Patient Details  Name: Michaela Holland MRN: [id number] Date of Birth: [birth year] Referring Provider: Dr. Sunnie Nielsen  Encounter Date: 05/03/2017      PT End of Session - 05/03/17 1353    Visit Number 3   Number of Visits 12   Date for PT Re-Evaluation 05/30/17   PT Start Time 1333   PT Stop Time 1414   PT Time Calculation (min) 41 min      Past Medical History:  Diagnosis Date  . Anxiety   . Depression   . Diabetes mellitus without complication (HCC)   . Hypertension   . Obesity     Past Surgical History:  Procedure Laterality Date  . CESAREAN SECTION    . CHOLECYSTECTOMY      There were no vitals filed for this visit.      Subjective Assessment - 05/03/17 1620    Subjective Pt reports she has noticed the tape applied to knee has given her some pain relief.  She has been working on stretching and a more normal gait.  She would like to know how to apply tape to her knee.  She is having MRI tomorrow.   Pain is no longer waking her at night.    Patient Stated Goals find out what is causing the pain and fix it.    Currently in Pain? Yes   Pain Score 4    Pain Location Knee   Pain Orientation Left   Pain Descriptors / Indicators Sharp;Tightness   Aggravating Factors  weight bearing    Pain Relieving Factors OTC med, tape on knee            [facility] PT Assessment - 05/03/17 0001      Assessment   Medical Diagnosis Lt knee pain   Referring Provider Dr. Sunnie Nielsen   Onset Date/Surgical Date 01/15/17   Hand Dominance Right   Next MD Visit PRN   Prior Therapy none     Flexibility   Hamstrings Lt 82 deg         [facility] Adult PT Treatment/Exercise - 05/03/17 0001      Self-Care   Self-Care Other Self-Care Comments   Other Self-Care Comments  Pt educated on self  application of dynamic tape for improved patella tracking.      Knee/Hip Exercises: Stretches   Passive Hamstring Stretch Left;2 reps;60 seconds   Gastroc Stretch Right;Left;2 reps;30 seconds     Modalities   Modalities Vasopneumatic     Manual Therapy   Manual therapy comments K shape placed around Lt knee (lateral border) and perpendicular strip to pull patella more medially.    Kinesiotex Facilitate Muscle  dynamic tape to Lt knee to correct lateral tracking           PT Long Term Goals - 05/03/17 1624      PT LONG TERM GOAL #1   Title I with advanced HEP (05/30/17)    Time 6   Period Weeks   Status On-going     PT LONG TERM GOAL #2   Title demo Lt knee and hip strength =/> 5-/5 to take pressure off knee ( 05/30/17)    Time 6   Period Weeks   Status On-going     PT LONG TERM GOAL #3   Title report =/> 75% reduction in Lt knee pain (  05/30/17)    Time 6   Period Weeks   Status On-going     PT LONG TERM GOAL #4   Title demo normalized gait pattern on even surfaces ( 05/30/17)    Time 6   Period Weeks   Status On-going     PT LONG TERM GOAL #5   Title improve FOTO =/< 47% limited ( 05/30/17)    Time 6   Period Weeks   Status On-going               Plan - 05/03/17 1409    Clinical Impression Statement Improving Lt hamstring flexibility.  Pt verbalized understanding of application of dynamic tape for improved patella tracking.   Pt making gradual progress towards goals.    Rehab Potential Good   PT Frequency 2x / week   PT Duration 6 weeks   PT Treatment/Interventions Moist Heat;Ultrasound;Therapeutic exercise;Dry needling;Taping;Vasopneumatic Device;Manual techniques;Neuromuscular re-education;Cryotherapy;Electrical Stimulation;Iontophoresis 4mg /ml Dexamethasone;Patient/family education   PT Next Visit Plan Progress LE strengthening.  Assess need for further education regarding tape application.    Consulted and Agree with Plan of Care Patient       Patient will benefit from skilled therapeutic intervention in order to improve the following deficits and impairments:  Abnormal gait, Decreased range of motion, Increased muscle spasms, Pain, Obesity, Decreased strength  Visit Diagnosis: Chronic pain of left knee  Muscle weakness (generalized)     Problem List Patient Active Problem List   Diagnosis Date Noted  . History of obstructive sleep apnea 04/09/2017  . Hypothyroidism 04/09/2017  . PCOS (polycystic ovarian syndrome) 04/09/2017  . Anxiety 04/09/2017  . Chronic pain of left knee 04/09/2017  . Gastroesophageal reflux disease with esophagitis 04/09/2017   Mayer Camel, PTA 05/03/17 4:27 PM  [facility] [place] [address] [place], [place], [address] Phone: [phone]   Fax:  [phone]  Name: Michaela Holland MRN: [id number] Date of Birth: 02-21-1981

## 2017-05-04 ENCOUNTER — Ambulatory Visit (HOSPITAL_BASED_OUTPATIENT_CLINIC_OR_DEPARTMENT_OTHER)
Admission: RE | Admit: 2017-05-04 | Discharge: 2017-05-04 | Disposition: A | Discharge status: Home / Self Care | Payer: 59 | Source: Ambulatory Visit | Attending: Family Medicine | Admitting: Family Medicine

## 2017-05-04 DIAGNOSIS — R600 Localized edema: Secondary | ICD-10-CM | POA: Diagnosis not present

## 2017-05-04 DIAGNOSIS — S83512A Sprain of anterior cruciate ligament of left knee, initial encounter: Secondary | ICD-10-CM | POA: Diagnosis not present

## 2017-05-04 DIAGNOSIS — M94262 Chondromalacia, left knee: Secondary | ICD-10-CM | POA: Diagnosis not present

## 2017-05-04 DIAGNOSIS — M25562 Pain in left knee: Secondary | ICD-10-CM | POA: Insufficient documentation

## 2017-05-04 DIAGNOSIS — X58XXXA Exposure to other specified factors, initial encounter: Secondary | ICD-10-CM | POA: Diagnosis not present

## 2017-05-04 DIAGNOSIS — G8929 Other chronic pain: Secondary | ICD-10-CM

## 2017-05-06 ENCOUNTER — Encounter: Payer: Self-pay | Admitting: Family Medicine

## 2017-05-06 NOTE — Progress Notes (Unsigned)
Pt called left a vm that she needs to schedule an appt for Mri result f/up but wanted to know if there were any restrictions since she has to go to work before she comes for an appointment. Pt sent a message via my chart as well. Thanks

## 2017-05-08 ENCOUNTER — Ambulatory Visit (INDEPENDENT_AMBULATORY_CARE_PROVIDER_SITE_OTHER): Payer: 59 | Admitting: Family Medicine

## 2017-05-08 ENCOUNTER — Telehealth: Payer: Self-pay | Admitting: Osteopathic Medicine

## 2017-05-08 VITALS — BP 128/93 | HR 83 | Temp 97.7°F | Wt 297.0 lb

## 2017-05-08 DIAGNOSIS — S83519A Sprain of anterior cruciate ligament of unspecified knee, initial encounter: Secondary | ICD-10-CM | POA: Insufficient documentation

## 2017-05-08 DIAGNOSIS — E669 Obesity, unspecified: Secondary | ICD-10-CM | POA: Insufficient documentation

## 2017-05-08 DIAGNOSIS — M23001 Cystic meniscus, unspecified lateral meniscus, left knee: Secondary | ICD-10-CM | POA: Diagnosis not present

## 2017-05-08 DIAGNOSIS — S83512A Sprain of anterior cruciate ligament of left knee, initial encounter: Secondary | ICD-10-CM

## 2017-05-08 DIAGNOSIS — M25562 Pain in left knee: Secondary | ICD-10-CM

## 2017-05-08 DIAGNOSIS — G8929 Other chronic pain: Secondary | ICD-10-CM

## 2017-05-08 NOTE — Patient Instructions (Signed)
Thank you for coming in today. Continue PT.  I recommend Dr Dalbert GarnetBeasley for weight loss.  In 1 month if not significantly better we will refer to Orthopedics.  I would recommend Dr Magnus IvanBlackman.  Recheck with me as needed.  Call or go to the ER if you develop a large red swollen joint with extreme pain or oozing puss.

## 2017-05-08 NOTE — Telephone Encounter (Signed)
Pt stopped by check out. She wants to know if she should be fitted for Orthotics due the pain she's been having. Please advise.  Thank you

## 2017-05-08 NOTE — Progress Notes (Signed)
Michaela Holland is a 36 y.o. female who presents to [facility] today for left knee pain follow-up. Patient has left knee pain now for several months. She was seen on the 14th and had MRI to evaluate for potential meniscus injury. She's here today to follow-up the MRI results. She notes continued pain. She's had a few episodes of physical therapy which been mildly helpful. She notes that she benefited from kinesiotaping but is allergic to tape.   Past Medical History:  Diagnosis Date  . Anxiety   . Depression   . Diabetes mellitus without complication (HCC)   . Hypertension   . Obesity    Past Surgical History:  Procedure Laterality Date  . CESAREAN SECTION    . CHOLECYSTECTOMY     Social History  Substance Use Topics  . Smoking status: Never Smoker  . Smokeless tobacco: Never Used  . Alcohol use Yes     ROS:  As above   Medications: Current Outpatient Prescriptions  Medication Sig Dispense Refill  . clonazePAM (KLONOPIN) 0.5 MG tablet Take 0.5-1 tablets (0.25-0.5 mg total) by mouth 2 (two) times daily as needed for anxiety (use sparingly to prevent tolerance/dependence). 30 tablet 0  . colchicine 0.6 MG tablet Take 2 tabs (1.2mg ) followed by 1 tab (0.6mg ) 1 hour later. Then 1 tab (0.6mg ) daily until pain resolves 8 tablet 0  . diclofenac sodium (VOLTAREN) 1 % GEL Apply 4 g topically 4 (four) times daily. To affected joint. 100 g 11  . levothyroxine (SYNTHROID, LEVOTHROID) 25 MCG tablet Take 25 mcg by mouth daily before breakfast.    . metFORMIN (GLUCOPHAGE) 500 MG tablet Take 1,500 mg by mouth at bedtime.     Marland Kitchen omeprazole (PRILOSEC) 40 MG capsule Take 1 capsule (40 mg total) by mouth daily. 30 capsule 3  . thyroid (ARMOUR) 120 MG tablet Take 105 mg by mouth daily before breakfast.     No current facility-administered medications for this visit.    Allergies  Allergen Reactions  . Sulfa Antibiotics Hives and Shortness Of Breath    . Other     Nuts severe reaction difficulty breathing , throat swelling     Exam:  BP (!) 128/93 (BP Location: Left Arm, Patient Position: Sitting, Cuff Size: Large)   Pulse 83   Temp 97.7 F (36.5 C) (Oral)   Wt 297 lb (134.7 kg)   SpO2 98%   BMI 50.98 kg/m  General: Well Developed, well nourished, and in no acute distress.  Neuro/Psych: Alert and oriented x3, extra-ocular muscles intact, able to move all 4 extremities, sensation grossly intact. Skin: Warm and dry, no rashes noted.  Respiratory: Not using accessory muscles, speaking in full sentences, trachea midline.  Cardiovascular: Pulses palpable, no extremity edema. Abdomen: Does not appear distended. MSK:  Left knee no erythema or effusion. Nontender normal motion. Stable ligamentous exam. Negative anterior drawer test.   Procedure: Real-time Ultrasound Guided Injection of left knee Device: GE Logiq E  Images permanently stored and available for review in the ultrasound unit. Verbal informed consent obtained. Discussed risks and benefits of procedure. Warned about infection bleeding damage to structures skin hypopigmentation and fat atrophy among others. Patient expresses understanding and agreement Time-out conducted.  Noted no overlying erythema, induration, or other signs of local infection.  Skin prepped in a sterile fashion.  Local anesthesia: Topical Ethyl chloride.  With sterile technique and under real time ultrasound guidance: 40mg  kenalog and 4ml marcaine injected easily.  Completed without difficulty  Pain immediately resolved suggesting accurate placement of the medication.  Advised to call if fevers/chills, erythema, induration, drainage, or persistent bleeding.  Images permanently stored and available for review in the ultrasound unit.  Impression: Technically successful ultrasound guided injection.   Study Result   CLINICAL DATA:  Diffuse left knee pain x2 months after walking  on uneven ground.  EXAM: MRI OF THE LEFT KNEE WITHOUT CONTRAST  TECHNIQUE: Multiplanar, multisequence MR imaging of the knee was performed. No intravenous contrast was administered.  COMPARISON:  None.  FINDINGS: MENISCI  Medial meniscus:  Intact.  Lateral meniscus: There is a small cluster of cysts adjacent to the anteromesial corner of the lateral meniscus, likely representing parameniscal cysts and inferring a meniscal tear, likely off the anterior horn no discrete tear is not definitively identified in this location given the presence normal meniscal fascicles.  LIGAMENTS  Cruciates: Intrasubstance tear of the proximal fibers of the ACL, series 9, image 10 and series 5, image 11.  Collaterals: Medial collateral ligament is intact. Lateral collateral ligament complex is intact.  CARTILAGE  Patellofemoral: Mild fraying of the cartilage overlying the lateral patellar facet.  Medial: Mild partial-thickness cartilage loss of the medial femorotibial compartment.  Lateral: Moderate-to-marked chondromalacia of the lateral femorotibial compartment down to bone.  Joint:  No joint effusion. Normal Hoffa's fat. No plical thickening.  Popliteal Fossa: Trace fluid in the expected location of a Baker's cyst. Intact popliteus tendon.  Extensor Mechanism:  Intact quadriceps tendon and patellar tendon.  Bones:  Marrow edema of the anterior tibial plateau.  Other: Prepatellar tendon soft tissue edema.  IMPRESSION: 1. Partial intrasubstance tear of the proximal fibers of the ACL. 2. Anteromesial cluster of parameniscal cysts adjacent to the anterior horn of the lateral meniscus inferring a tear. Given the presence of meniscal fascicles, a discrete tear is difficult to identify. 3. Bone marrow edema of the anteromesial tibial plateau. 4. Chondromalacia of the femorotibial compartment more so laterally.   Electronically Signed   By: Tollie Ethavid  Kwon  M.D.   On: 05/04/2017 20:11      Assessment and Plan: 36 y.o. female with Left knee pain. Patient has multiple abnormalities on MRI. I think the majority of the pain is medial to patellofemoral which she does not have a significant amount of abnormalities on her MRI in these areas. We discussed options. Plan for trial of conservative management with steroid injection and continue physical therapy. Additionally we'll recommend a patellar stabilizer brace. Will check back in a month and if not better will refer for surgery.  Additionally recommend weight loss and quad strengthening. Patient will start with Dr. Dalbert GarnetBeasley.    No orders of the defined types were placed in this encounter.  No orders of the defined types were placed in this encounter.   Discussed warning signs or symptoms. Please see discharge instructions. Patient expresses understanding.

## 2017-05-08 NOTE — Telephone Encounter (Signed)
I'm not sure orthotics are in a helped very much for the knee pain. They may help for foot pain. I'm happy to make them regardless if she liked. Please bring tennis shoes that you wear at work and regular shoes when we make orthotics.

## 2017-05-09 ENCOUNTER — Ambulatory Visit (INDEPENDENT_AMBULATORY_CARE_PROVIDER_SITE_OTHER): Payer: 59 | Admitting: Physical Therapy

## 2017-05-09 DIAGNOSIS — M25562 Pain in left knee: Secondary | ICD-10-CM

## 2017-05-09 DIAGNOSIS — R293 Abnormal posture: Secondary | ICD-10-CM

## 2017-05-09 DIAGNOSIS — G8929 Other chronic pain: Secondary | ICD-10-CM

## 2017-05-09 DIAGNOSIS — M6281 Muscle weakness (generalized): Secondary | ICD-10-CM | POA: Diagnosis not present

## 2017-05-09 DIAGNOSIS — R2689 Other abnormalities of gait and mobility: Secondary | ICD-10-CM | POA: Diagnosis not present

## 2017-05-09 NOTE — Therapy (Signed)
[facility] [address] [place], [place], [address] Phone: [phone]   Fax:  [phone]  Physical Therapy Treatment  Patient Details  Name: Michaela Holland K Klinck MRN: [id number] Date of Birth: [birth year] Referring Provider: Dr. Sunnie NielsenNatalie Alexander   Encounter Date: 05/09/2017      PT End of Session - 05/09/17 1408    Visit Number 4   Number of Visits 12   Date for PT Re-Evaluation 05/30/17   PT Start Time 1408   PT Stop Time 1446   PT Time Calculation (min) 38 min   Activity Tolerance Patient tolerated treatment well   Behavior During Therapy [facility] for tasks assessed/performed      Past Medical History:  Diagnosis Date  . Anxiety   . Depression   . Diabetes mellitus without complication (HCC)   . Hypertension   . Obesity     Past Surgical History:  Procedure Laterality Date  . CESAREAN SECTION    . CHOLECYSTECTOMY      There were no vitals filed for this visit.      Subjective Assessment - 05/09/17 1410    Subjective Pt reports she had a reaction to the tape applied to knee last session. She received results from MRI. She also had injection from Dr. Denyse Amassorey for knee, this has reduced pain.   She would like to continue PT to increase strength in LE and reduce pain.    Currently in Pain? Yes   Pain Score 2    Pain Location Knee   Pain Orientation Left;Posterior   Pain Descriptors / Indicators Tightness   Aggravating Factors  stairs, prolonged walking   Pain Relieving Factors ??            [facility] PT Assessment - 05/09/17 0001      Assessment   Medical Diagnosis Lt knee pain   Referring Provider Dr. Sunnie NielsenNatalie Alexander    Onset Date/Surgical Date 01/15/17   Hand Dominance Right   Next MD Visit PRN   Prior Therapy none     Flexibility   Hamstrings Lt 87 deg.    Quadriceps Lt knee 114 deg,            [facility] Adult PT Treatment/Exercise - 05/09/17 0001      Knee/Hip Exercises: Stretches   Passive  Hamstring Stretch Left;2 reps;60 seconds   Quad Stretch Right;Left;2 reps;60 seconds   Gastroc Stretch Right;Left;2 reps;30 seconds   Soleus Stretch Right;Left;2 reps;30 seconds     Knee/Hip Exercises: Standing   Wall Squat 5 reps;5 seconds  limited depth   Other Standing Knee Exercises warrior 2 pose isometric hold x 30 sec, then small pulses x 10 sec - 2 reps      Knee/Hip Exercises: Supine   Straight Leg Raises Left;2 sets;10 reps  VC to keep knee straight     Modalities   Modalities --  declined, will ice at home due time constraints     Manual Therapy   Manual therapy comments 2- I strips of sensitive skin rock tape placed with 50% stretch on Lt knee, one piece medial joint to fat pad, then other piece distal to patella on lateral portion of knee.  All scab areas from previous irritation were avoided.                 PT Education - 05/09/17 1446    Education provided Yes   Education Details HEP    Person(s) Educated Patient   Methods Explanation;Handout;Demonstration;Verbal  cues   Comprehension Verbalized understanding;Returned demonstration             PT Long Term Goals - 05/09/17 1455      PT LONG TERM GOAL #1   Title I with advanced HEP (05/30/17)    Time 6   Period Weeks   Status On-going     PT LONG TERM GOAL #2   Title demo Lt knee and hip strength =/> 5-/5 to take pressure off knee ( 05/30/17)    Time 6   Period Weeks   Status On-going     PT LONG TERM GOAL #3   Title report =/> 75% reduction in Lt knee pain ( 05/30/17)    Time 6   Period Weeks   Status On-going     PT LONG TERM GOAL #4   Title demo normalized gait pattern on even surfaces ( 05/30/17)    Time 6   Period Weeks   Status On-going  improved since injection     PT LONG TERM GOAL #5   Title improve FOTO =/< 47% limited ( 05/30/17)    Time 6   Period Weeks   Status On-going               Plan - 05/09/17 1802    Clinical Impression Statement Pt demonstrated  improved Lt hamstring quad and hamstring flexibility.  Her overall pain level has decreased and gait has visible improved since injection.  Pt making gradual progress towards goals.    Rehab Potential Good   PT Frequency 2x / week   PT Duration 6 weeks   PT Treatment/Interventions Moist Heat;Ultrasound;Therapeutic exercise;Dry needling;Taping;Vasopneumatic Device;Manual techniques;Neuromuscular re-education;Cryotherapy;Electrical Stimulation;Iontophoresis 4mg /ml Dexamethasone;Patient/family education   PT Next Visit Plan assess response to new HEP and new tape application.    Consulted and Agree with Plan of Care Patient      Patient will benefit from skilled therapeutic intervention in order to improve the following deficits and impairments:  Abnormal gait, Decreased range of motion, Increased muscle spasms, Pain, Obesity, Decreased strength  Visit Diagnosis: Chronic pain of left knee  Muscle weakness (generalized)  Abnormal posture  Other abnormalities of gait and mobility     Problem List Patient Active Problem List   Diagnosis Date Noted  . Cyst of lateral meniscus of left knee 05/08/2017  . Anterior cruciate ligament sprain 05/08/2017  . Morbid obesity (HCC) 05/08/2017  . History of obstructive sleep apnea 04/09/2017  . Hypothyroidism 04/09/2017  . PCOS (polycystic ovarian syndrome) 04/09/2017  . Anxiety 04/09/2017  . Chronic pain of left knee 04/09/2017  . Gastroesophageal reflux disease with esophagitis 04/09/2017   Mayer Camel, PTA 05/09/17 6:04 PM  [facility] [place] [address] [place], [place], [address] Phone: [phone]   Fax:  [phone]  Name: DAYLAN BOGGESS MRN: [id number] Date of Birth: 07-24-1981

## 2017-05-09 NOTE — Patient Instructions (Signed)
Calf Stretch    Place hands on wall at shoulder height. Keeping back leg straight, bend front leg, feet pointing forward, heels flat on floor. Lean forward slightly until stretch is felt in calf of back leg. Hold stretch __30_ seconds, breathing slowly in and out, 2 sets. Repeat stretch with other leg back. Do _2_ sessions per day. Variation: Use chair or table for support.  Hamstring Step 1    Straighten left knee. Lift leg until a stretch is felt. Hold _30__ seconds. Relax knee by returning foot to start. Repeat _2__ times.  Strengthening: Wall Slide    Leaning on wall, slowly lower buttocks until thighs are parallel to floor. Hold __10__ seconds. Tighten thigh muscles and return. Repeat __5__ times per set. Do _1-2___ sets per session. Do __3__ sessions per week.   Warrior II Pose    In wide stance, turn left foot and bend knee. Extend right leg to left, foot facing forward. Extend arms at shoulder level, palms down. Turn head to the left, keep torso facing forward. Hold for _15-30__ seconds. Repeat on other side. Repeat _5__ times, alternating sides. Do __1_ times per day.  KNEE: Quadriceps - Prone    Place strap around ankle. Bring ankle toward buttocks. Press hip into surface. Hold _30-60__ seconds. _2__ reps per set, _1__ sets per day, _7__ days per week  [facility] [address] [place], [place] [address]  [phone] (office) [phone] (fax)

## 2017-05-10 ENCOUNTER — Ambulatory Visit: Payer: 59 | Admitting: Family Medicine

## 2017-05-14 ENCOUNTER — Ambulatory Visit (INDEPENDENT_AMBULATORY_CARE_PROVIDER_SITE_OTHER): Payer: 59 | Admitting: Physical Therapy

## 2017-05-14 DIAGNOSIS — G8929 Other chronic pain: Secondary | ICD-10-CM

## 2017-05-14 DIAGNOSIS — R293 Abnormal posture: Secondary | ICD-10-CM | POA: Diagnosis not present

## 2017-05-14 DIAGNOSIS — M6281 Muscle weakness (generalized): Secondary | ICD-10-CM | POA: Diagnosis not present

## 2017-05-14 DIAGNOSIS — M25562 Pain in left knee: Secondary | ICD-10-CM

## 2017-05-14 NOTE — Therapy (Signed)
[facility] [place], [place], [address] Phone: [phone]   Fax:  [phone]  Physical Therapy Treatment  Patient Details  Name: Michaela Holland MRN: [id number] Date of Birth: [birth year] Referring Provider: Dr. Emeterio Reeve  Encounter Date: 05/14/2017      PT End of Session - 05/14/17 1121    Visit Number 5   Number of Visits 12   Date for PT Re-Evaluation 05/30/17   PT Start Time 1111  pt arrived late   PT Stop Time 1147   PT Time Calculation (min) 36 min   Activity Tolerance Patient tolerated treatment well   Behavior During Therapy [facility] for tasks assessed/performed      Past Medical History:  Diagnosis Date  . Anxiety   . Depression   . Diabetes mellitus without complication ([place])   . Hypertension   . Obesity     Past Surgical History:  Procedure Laterality Date  . CESAREAN SECTION    . CHOLECYSTECTOMY      There were no vitals filed for this visit.      Subjective Assessment - 05/14/17 1114    Subjective Pt reports one piece of the tape rolled off a few hours later, the other piece lasted 4 days.  She feels she is no longer limping. She still has had relief from shot.    Currently in Pain? No/denies   Pain Score 0-No pain            [facility] PT Assessment - 05/14/17 0001      Assessment   Medical Diagnosis Lt knee pain   Referring Provider Dr. Emeterio Reeve   Onset Date/Surgical Date 01/15/17   Hand Dominance Right   Next MD Visit PRN     Strength   Right/Left Hip Right;Left   Left Hip Flexion 5/5   Left Hip Extension 5/5   Left Hip ABduction --  5-/5   Left Knee Flexion 5/5   Left Knee Extension 5/5     Flexibility   Quadriceps Lt knee 115, Rt knee 124           [facility] Adult PT Treatment/Exercise - 05/14/17 0001      Knee/Hip Exercises: Stretches   Passive Hamstring Stretch Right;Left;2 reps;30 seconds   Quad Stretch Right;Left;2 reps;30 seconds   Gastroc Stretch Right;Left;30 seconds     Knee/Hip Exercises: Aerobic   Recumbent Bike L2: 4.5 min      Knee/Hip Exercises: Standing   Heel Raises 10 reps;Both;2 sets   Wall Squat 5 seconds;10 reps  limited depth   Other Standing Knee Exercises warrior 2 pose isometric hold x 30 sec, then small pulses x 10 sec - 4 reps - each side     Cryotherapy   Number Minutes Cryotherapy --   Cryotherapy Location --   Type of Cryotherapy --     Manual Therapy   Manual therapy comments 2- I strips of regular rock tape placed with 50% stretch on Lt knee, one piece medial joint to fat pad, then other piece distal to patella on lateral portion of knee.  All scab areas from previous irritation were avoided.                      PT Long Term Goals - 05/14/17 1126      PT LONG TERM GOAL #1   Title I with advanced HEP (05/30/17)    Time 6   Period Weeks  Status On-going     PT LONG TERM GOAL #2   Title demo Lt knee and hip strength =/> 5-/5 to take pressure off knee ( 05/30/17)    Time 6   Period Weeks   Status On-going     PT LONG TERM GOAL #3   Title report =/> 75% reduction in Lt knee pain ( 05/30/17)    Time 6   Period Weeks   Status Achieved  reports 75% reduction     PT LONG TERM GOAL #4   Title demo normalized gait pattern on even surfaces ( 05/30/17)    Time 6   Period Weeks   Status Achieved     PT LONG TERM GOAL #5   Title improve FOTO =/< 47% limited ( 05/30/17)    Time 6   Period Weeks   Status On-going               Plan - 05/14/17 1129    Clinical Impression Statement Pt continues to have pain relief from recent injection; 75% reduction.  Strength has improved; pt reported she's no longer scared of leg motion being painful.  She tolerated all exercised well, with no symptoms other than fatigue.  she has met LTG#3 and 4.   PT Treatment/Interventions Moist Heat;Ultrasound;Therapeutic exercise;Dry needling;Taping;Vasopneumatic Device;Manual  techniques;Neuromuscular re-education;Cryotherapy;Electrical Stimulation;Iontophoresis 61m/ml Dexamethasone;Patient/family education   PT Next Visit Plan Assess response to reg Rock tape application; progress HEP.    Consulted and Agree with Plan of Care Patient      Patient will benefit from skilled therapeutic intervention in order to improve the following deficits and impairments:  Abnormal gait, Decreased range of motion, Increased muscle spasms, Pain, Obesity, Decreased strength  Visit Diagnosis: Chronic pain of left knee  Muscle weakness (generalized)  Abnormal posture     Problem List Patient Active Problem List   Diagnosis Date Noted  . Cyst of lateral meniscus of left knee 05/08/2017  . Anterior cruciate ligament sprain 05/08/2017  . Morbid obesity ([facility] 05/08/2017  . History of obstructive sleep apnea 04/09/2017  . Hypothyroidism 04/09/2017  . PCOS (polycystic ovarian syndrome) 04/09/2017  . Anxiety 04/09/2017  . Chronic pain of left knee 04/09/2017  . Gastroesophageal reflux disease with esophagitis 04/09/2017   JKerin Perna PTA 05/14/17 12:43 PM  [facility] [address]Phone: [phone]  Fax:  [phone] Name: Michaela WAIGHTMRN: [id number] of Birth: 908-06-1981

## 2017-05-22 ENCOUNTER — Encounter: Payer: 59 | Admitting: Physical Therapy

## 2017-05-29 ENCOUNTER — Ambulatory Visit (INDEPENDENT_AMBULATORY_CARE_PROVIDER_SITE_OTHER): Payer: 59 | Admitting: Neurology

## 2017-05-29 ENCOUNTER — Ambulatory Visit (INDEPENDENT_AMBULATORY_CARE_PROVIDER_SITE_OTHER): Payer: 59 | Admitting: Physical Therapy

## 2017-05-29 ENCOUNTER — Encounter: Payer: Self-pay | Admitting: Neurology

## 2017-05-29 VITALS — BP 129/89 | HR 76 | Ht 64.0 in | Wt 298.0 lb

## 2017-05-29 DIAGNOSIS — G2581 Restless legs syndrome: Secondary | ICD-10-CM

## 2017-05-29 DIAGNOSIS — M6281 Muscle weakness (generalized): Secondary | ICD-10-CM | POA: Diagnosis not present

## 2017-05-29 DIAGNOSIS — G4733 Obstructive sleep apnea (adult) (pediatric): Secondary | ICD-10-CM

## 2017-05-29 DIAGNOSIS — G4719 Other hypersomnia: Secondary | ICD-10-CM | POA: Diagnosis not present

## 2017-05-29 DIAGNOSIS — G8929 Other chronic pain: Secondary | ICD-10-CM

## 2017-05-29 DIAGNOSIS — M25562 Pain in left knee: Secondary | ICD-10-CM | POA: Diagnosis not present

## 2017-05-29 DIAGNOSIS — G478 Other sleep disorders: Secondary | ICD-10-CM | POA: Diagnosis not present

## 2017-05-29 DIAGNOSIS — G4761 Periodic limb movement disorder: Secondary | ICD-10-CM | POA: Diagnosis not present

## 2017-05-29 NOTE — Patient Instructions (Addendum)
HIP / KNEE: Extension - Sit to Stand    Sitting, lean chest forward, raise hips up from surface. Straighten hips and knees. Weight bear equally on left and right sides. Backs of legs should not push off surface.  SLOWLY LOWERING _10__ reps per set, __2_ sets per day, _5__ days per week Use assistive device as needed.Strengthening: Straight Leg Raise (Phase 3)    Resting on hands, tighten muscles on front of left thigh, then lift leg _2___ inches from surface, keeping knee locked. Repeat __5__ times per set. Do __1-3__ sets per session. Do _3___ sessions per week. Strengthening: Wall Slide    Leaning on wall, slowly lower buttocks until thighs are parallel to floor. Hold __3-5__ seconds. Tighten thigh muscles and return.  Can hold weight in/out at waist level.- no more than 5 lbs.  Repeat __5-10__ times per set. Do _2__ sets per session. Do __3__ sessions per week.   Balance: Unilateral    Attempt to balance on left leg, eyes open. Hold _30___ seconds. Repeat ___2_ times per set. Do _1___ sets per session. Do __1-2__ sessions per day. Perform exercise with eyes closed.[facility] [address] [place], [place] [address]  [phone] (office) [phone] (fax)

## 2017-05-29 NOTE — Therapy (Signed)
[facility], [place], [address] Phone: [phone]   Fax:  [phone]  Physical Therapy Treatment  Patient Details  Name: Michaela Holland MRN: [id number] Date of Birth: 07/16/1981 Referring Provider: Dr. Emeterio Reeve  Encounter Date: 05/29/2017      PT End of Session - 05/29/17 1443    Visit Number 6   Number of Visits 12   Date for PT Re-Evaluation 05/30/17   PT Start Time 1435   PT Stop Time 8366   PT Time Calculation (min) 42 min   Activity Tolerance Patient tolerated treatment well;No increased pain   Behavior During Therapy WFL for tasks assessed/performed      Past Medical History:  Diagnosis Date  . Anxiety   . Depression   . Diabetes mellitus without complication ([facility])   . Hypertension   . Obesity     Past Surgical History:  Procedure Laterality Date  . CESAREAN SECTION    . CHOLECYSTECTOMY      There were no vitals filed for this visit.      Subjective Assessment - 05/29/17 1440    Subjective Pt reports she has not been doing her exercises (was on vacation), but she has been more active with family.  She continues to have pain relief from injection.  she'd like to review some exercises, then be placed on hold for 2 wks while she continues to work on [employer].    Currently in Pain? No/denies   Pain Score 0-No pain            [facility] PT Assessment - 05/29/17 0001      Assessment   Medical Diagnosis Lt knee pain   Referring Provider Dr. Emeterio Reeve   Onset Date/Surgical Date 01/15/17   Hand Dominance Right   Next MD Visit PRN   Prior Therapy none     Observation/Other Assessments   Focus on Therapeutic Outcomes (FOTO)  28% limited     Single Leg Stance   Comments Lt 40 sec, Rt 24 sec      Strength   Left Hip Flexion 5/5   Left Hip Extension 5/5   Left Hip ABduction 5/5   Left Knee Flexion --  5-/5           [facility] Adult PT Treatment/Exercise -  05/29/17 0001      Knee/Hip Exercises: Stretches   Passive Hamstring Stretch Right;Left;2 reps;60 seconds   Quad Stretch Right;Left;2 reps;60 seconds   Gastroc Stretch Right;Left;30 seconds;2 reps     Knee/Hip Exercises: Standing   Wall Squat 10 reps;3 seconds  limited depth. 5# weight in/out from waist.    SLS Rt/Lt SLS x 2 trials each leg.      Knee/Hip Exercises: Seated   Sit to Sand 10 reps;without UE support  eccentric lowering      Long sitting SLR x 5 reps each side (very challenging) Reviewed all previous HEP exercises verbally.       PT Long Term Goals - 05/29/17 1444      PT LONG TERM GOAL #1   Title I with advanced HEP (05/30/17)    Time 6   Period Weeks   Status On-going     PT LONG TERM GOAL #2   Title demo Lt knee and hip strength =/> 5-/5 to take pressure off knee ( 05/30/17)    Time 6   Period Weeks Achieved     PT LONG TERM GOAL #3  Title report =/> 75% reduction in Lt knee pain ( 05/30/17)    Time 6   Period Weeks   Status Achieved     PT LONG TERM GOAL #4   Title demo normalized gait pattern on even surfaces ( 05/30/17)    Time 6   Period Weeks   Status Achieved     PT LONG TERM GOAL #5   Title improve FOTO =/< 47% limited ( 05/30/17)    Time 6   Period Weeks   Status Achieved  28% limited               Plan - 05/29/17 1701    Clinical Impression Statement Pt continues to have pain relief from injection.  She fatigued quickly with exercise today, but no pain.  Her LE strength has improved.  She is interested in holding therapy for 2 wks while she works on [employer].  She partially met her goals.    Rehab Potential Good   PT Frequency 2x / week   PT Duration 6 weeks   PT Treatment/Interventions Moist Heat;Ultrasound;Therapeutic exercise;Dry needling;Taping;Vasopneumatic Device;Manual techniques;Neuromuscular re-education;Cryotherapy;Electrical Stimulation;Iontophoresis 70m/ml Dexamethasone;Patient/family education   PT Next Visit Plan Will  hold for 2 wks, until 10/4.  If pt doesn't return, will d/c to HEP.    Consulted and Agree with Plan of Care Patient      Patient will benefit from skilled therapeutic intervention in order to improve the following deficits and impairments:  Abnormal gait, Decreased range of motion, Increased muscle spasms, Pain, Obesity, Decreased strength  Visit Diagnosis: Chronic pain of left knee  Muscle weakness (generalized)     Problem List Patient Active Problem List   Diagnosis Date Noted  . Cyst of lateral meniscus of left knee 05/08/2017  . Anterior cruciate ligament sprain 05/08/2017  . Morbid obesity ([place] 05/08/2017  . History of obstructive sleep apnea 04/09/2017  . Hypothyroidism 04/09/2017  . PCOS (polycystic ovarian syndrome) 04/09/2017  . Anxiety 04/09/2017  . Chronic pain of left knee 04/09/2017  . Gastroesophageal reflux disease with esophagitis 04/09/2017   JKerin Perna PTA 05/29/17 5:06 PM  [address] [address]Phone: [phone]  Fax:  [phone] Name: Michaela LICHTMRN: [id number] of Birth: 9May 15, 1982

## 2017-05-29 NOTE — Progress Notes (Signed)
Subjective:    Patient ID: Michaela Holland is a 36 y.o. female.  HPI     Michaela Foley, MD, PhD [facility] [address] [address] [place], [place] [address]  Dear Dr. Lyn Hollingshead,   I saw your patient, Michaela Holland, upon your kind request in my neurologic clinic today for initial consultation of her sleep disorder, in particular, her recent diagnosis of OSA. The patient is unaccompanied today. As you know, Michaela Holland is a 36 year old right-handed woman with an underlying medical history of hypothyroidism, PCO S, anxiety, left knee pain, reflux disease, and morbid obesity with a BMI of over 50, who was recently diagnosed with obstructive sleep apnea. She had a sleep study out of state in [place]. I reviewed her sleep study results from 12/10/2016. Sleep efficiency was 86%, sleep latency 9.5 minutes, REM percentage 19%. Total AHI was 7 per hour, supine AHI 20.6 per hour, average oxygen saturation 94%, nadir was 89%. She was noted to snore mildly. PLM index was 58 per hour with an associated arousal index of 15.4 per hour. I reviewed your office note from 04/09/2017. Her Epworth sleepiness score is 8 out of 24, fatigue score is 44 out of 63. She works at [employer]. She lives with her husband, they have 1 child. She is a nonsmoker, does not drink alcohol on a regular basis, does not use illicit drugs and drinks caffeine in the form of coffee, 1-2 cups a day. She had restless leg syndrome quite significantly during her pregnancy and was also diagnosed with iron deficiency at the time. She is not aware of her leg movements at night but did have severe PLMS during her sleep study interestingly in April of this year. 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 She has 37-year-old daughter and her husband has a flexible schedule thankfully. Bedtime is around 11 and wake up time during workdays is 5:15 AM, otherwise 7 or 7:30 AM. She does not have night to night nocturia. She does not  typically have morning headaches. She does not have a family history of sleep apnea but suspects that her father may have it. Her original complaint that prompted sleep study testing was daytime somnolence and nonrestorative sleep.   Her Past Medical History Is Significant For: Past Medical History:  Diagnosis Date  . Anxiety   . Depression   . Diabetes mellitus without complication ([facility])   . Hypertension   . Obesity     Her Past Surgical History Is Significant For: Past Surgical History:  Procedure Laterality Date  . CESAREAN SECTION    . CHOLECYSTECTOMY      Her Family History Is Significant For: No family history on file.  Her Social History Is Significant For: Social History   Social History  . Marital status: Married    Spouse name: N/A  . Number of children: N/A  . Years of education: N/A   Social History Main Topics  . Smoking status: Never Smoker  . Smokeless tobacco: Never Used  . Alcohol use Yes  . Drug use: No  . Sexual activity: Not Asked   Other Topics Concern  . None   Social History Narrative  . None    Her Allergies Are:  Allergies  Allergen Reactions  . Sulfa Antibiotics Hives and Shortness Of Breath  . Other     Nuts severe reaction difficulty breathing , throat swelling  :   Her Current Medications Are:  Outpatient Encounter Prescriptions  as of 05/29/2017  Medication Sig  . clonazePAM (KLONOPIN) 0.5 MG tablet Take 0.5-1 tablets (0.25-0.5 mg total) by mouth 2 (two) times daily as needed for anxiety (use sparingly to prevent tolerance/dependence).  . colchicine 0.6 MG tablet Take 2 tabs (1.2mg ) followed by 1 tab (0.6mg ) 1 hour later. Then 1 tab (0.6mg ) daily until pain resolves  . diclofenac sodium (VOLTAREN) 1 % GEL Apply 4 g topically 4 (four) times daily. To affected joint.  Marland Kitchen levothyroxine (SYNTHROID, LEVOTHROID) 25 MCG tablet Take 25 mcg by mouth daily before breakfast.  . metFORMIN (GLUCOPHAGE) 500 MG tablet Take 1,500 mg by mouth at  bedtime.   Marland Kitchen omeprazole (PRILOSEC) 40 MG capsule Take 1 capsule (40 mg total) by mouth daily.  . Thyroid (LEVOTHYROXINE-LIOTHYRONINE) 15 MG TABS Take 15 mg by mouth daily.  . Thyroid (LEVOTHYROXINE-LIOTHYRONINE) 90 MG TABS Take 90 mg by mouth daily.  . [DISCONTINUED] thyroid (ARMOUR) 120 MG tablet Take 105 mg by mouth daily before breakfast.   No facility-administered encounter medications on file as of 05/29/2017.   :  Review of Systems:  Out of a complete 14 point review of systems, all are reviewed and negative with the exception of these symptoms as listed below: Review of Systems  Neurological:       Pt says that she had a sleep study done in the Spring of 2018 in [place], [place]. Results were positive for sleep apnea, but she moved before getting set up on any type of CPAP.   Epworth Sleepiness Scale 0= would never doze 1= slight chance of dozing 2= moderate chance of dozing 3= high chance of dozing  Sitting and reading:1 Watching TV:1 Sitting inactive in a public place (ex. Theater or meeting):1 As a passenger in a car for an hour without a break:1 Lying down to rest in the afternoon:3 Sitting and talking to someone:0 Sitting quietly after lunch (no alcohol):1 In a car, while stopped in traffic:0 Total: 8     Objective:  Neurological Exam  Physical Exam Physical Examination:   Vitals:   05/29/17 1606  BP: 129/89  Pulse: 76    General Examination: The patient is a very pleasant 36 y.o. female in no acute distress. She appears well-developed and well-nourished and well groomed.   HEENT: Normocephalic, atraumatic, pupils are equal, round and reactive to light and accommodation. Funduscopic exam is normal with sharp disc margins noted. Extraocular tracking is good without limitation to gaze excursion or nystagmus noted. Normal smooth pursuit is noted. Hearing is grossly intact. Tympanic membranes are clear bilaterally. Face is symmetric with normal facial animation and  normal facial sensation. Speech is clear with no dysarthria noted. There is no hypophonia. There is no lip, neck/head, jaw or voice tremor. Neck is supple with full range of passive and active motion. There are no carotid bruits on auscultation. Oropharynx exam reveals: mild mouth dryness, good dental hygiene and moderate airway crowding, due to smaller airway, larger uvula, tonsils of 1+. Mallampati is class II. Tongue protrudes centrally and palate elevates symmetrically. Neck size is 15 1/8 inches.   Chest: Clear to auscultation without wheezing, rhonchi or crackles noted.  Heart: S1+S2+0, regular and normal without murmurs, rubs or gallops noted.   Abdomen: Soft, non-tender and non-distended with normal bowel sounds appreciated on auscultation.  Extremities: There is no pitting edema in the distal lower extremities bilaterally. Pedal pulses are intact.  Skin: Warm and dry without trophic changes noted.  Musculoskeletal: exam reveals no obvious joint deformities, tenderness  or joint swelling or erythema.   Neurologically:  Mental status: The patient is awake, alert and oriented in all 4 spheres. Her immediate and remote memory, attention, language skills and fund of knowledge are appropriate. There is no evidence of aphasia, agnosia, apraxia or anomia. Speech is clear with normal prosody and enunciation. Thought process is linear. Mood is normal and affect is normal.  Cranial nerves II - XII are as described above under HEENT exam. In addition: shoulder shrug is normal with equal shoulder height noted. Motor exam: Normal bulk, strength and tone is noted. There is no drift, tremor or rebound. Romberg is negative. Reflexes are 1-2+ throughout. Fine motor skills and coordination: intact with normal finger taps, normal hand movements, normal rapid alternating patting, normal foot taps and normal foot agility.  Cerebellar testing: No dysmetria or intention tremor. There is no truncal or gait ataxia.   Sensory exam: intact to light touch in the upper and lower extremities.  Gait, station and balance: She stands easily. No veering to one side is noted. No leaning to one side is noted. Posture is age-appropriate and stance is narrow based. Gait shows normal stride length and normal pace. No problems turning are noted. Tandem walk is unremarkable.   Assessment and Plan:  In summary, Michaela Holland is a very pleasant 36 y.o.-year old female with an underlying medical history of hypothyroidism, PCO S, anxiety, left knee pain, reflux disease, and morbid obesity with a BMI of over 50, who was diagnosed with mild to moderate obstructive sleep apnea with a baseline sleep study out of state in April 2018. She also had significant PLMS with arousals during the study, has a history of restless leg syndrome as well.  I had a long chat with the patient about my findings and the diagnosis of OSA, its prognosis and treatment options. We talked about medical treatments, surgical interventions and non-pharmacological approaches. I explained in particular the risks and ramifications of untreated moderate to severe OSA, especially with respect to developing cardiovascular disease down the Road, including congestive heart failure, difficult to treat hypertension, cardiac arrhythmias, or stroke. Even type 2 diabetes has, in part, been linked to untreated OSA. Symptoms of untreated OSA include daytime sleepiness, memory problems, mood irritability and mood disorder such as depression and anxiety, lack of energy, as well as recurrent headaches, especially morning headaches. We talked about trying to maintain a healthy lifestyle in general, as well as the importance of weight control. I encouraged the patient to eat healthy, exercise daily and keep well hydrated, to keep a scheduled bedtime and wake time routine, to not skip any meals and eat healthy snacks in between meals. I advised the patient not to drive when feeling sleepy. I  recommended the following at this time: sleep study with CPAP titration. (We will score hypopneas at 3%).   I explained the sleep test procedure to the patient and also outlined possible surgical and non-surgical treatment options of OSA, including the use of a custom-made dental device (which would require a referral to a specialist dentist or oral surgeon), upper airway surgical options, such as pillar implants, radiofrequency surgery, tongue base surgery, and UPPP (which would involve a referral to an ENT surgeon). Rarely, jaw surgery such as mandibular advancement may be considered.  I also explained the CPAP treatment option to the patient, who indicated that she would be willing to use CPAP. I explained the importance of being compliant with PAP treatment, not only for insurance purposes but  primarily to improve Her symptoms, and for the patient's long term health benefit, including to reduce Her cardiovascular risks. I answered all her questions today and the patient was in agreement. I would like to see her back after the sleep study is completed and encouraged her to call with any interim questions, concerns, problems or updates.   Thank you very much for allowing me to participate in the care of this nice patient. If I can be of any further assistance to you please do not hesitate to call me at [phone].  Sincerely,   Star Age, MD, PhD

## 2017-05-29 NOTE — Patient Instructions (Addendum)
Thank you for choosing [facility] for your sleep related care! It was nice to meet you today! I appreciate that you entrust me with your sleep related healthcare concerns. I hope, I was able to address at least some of your concerns today, and that I can help you feel reassured and also get better.    Here is what we discussed today and what we came up with as our plan for you:   I would like for you to consider CPAP therapy.  We will arrange for a CPAP titration sleep study.   Please remember, the risks and ramifications of moderate to severe obstructive sleep apnea or OSA are: Cardiovascular disease, including congestive heart failure, stroke, difficult to control hypertension, arrhythmias, and even type 2 diabetes has been linked to untreated OSA. Sleep apnea causes disruption of sleep and sleep deprivation in most cases, which, in turn, can cause recurrent headaches, problems with memory, mood, concentration, focus, and vigilance. Most people with untreated sleep apnea report excessive daytime sleepiness, which can affect their ability to drive. Please do not drive if you feel sleepy.   Our sleep lab administrative assistant, Alvis Lemmings will call you to schedule your sleep study. If you don't hear back from her by about 2 weeks from now, please feel free to call her at [phone]. You can leave a message with your phone number and concerns, if you get the voicemail box. She will call back as soon as possible.

## 2017-06-04 ENCOUNTER — Encounter: Payer: Self-pay | Admitting: Internal Medicine

## 2017-06-10 ENCOUNTER — Encounter: Payer: Self-pay | Admitting: Osteopathic Medicine

## 2017-06-10 ENCOUNTER — Ambulatory Visit (INDEPENDENT_AMBULATORY_CARE_PROVIDER_SITE_OTHER): Payer: 59 | Admitting: Family Medicine

## 2017-06-10 ENCOUNTER — Encounter: Payer: Self-pay | Admitting: Family Medicine

## 2017-06-10 VITALS — BP 140/79 | HR 76 | Wt 284.0 lb

## 2017-06-10 DIAGNOSIS — S83512A Sprain of anterior cruciate ligament of left knee, initial encounter: Secondary | ICD-10-CM | POA: Diagnosis not present

## 2017-06-10 DIAGNOSIS — M23001 Cystic meniscus, unspecified lateral meniscus, left knee: Secondary | ICD-10-CM

## 2017-06-10 DIAGNOSIS — M25562 Pain in left knee: Secondary | ICD-10-CM

## 2017-06-10 NOTE — Patient Instructions (Addendum)
Thank you for coming in today. You should hear from [facility] soon about appointment.  You can also try calling yourself.   I can do a shot anytime if you just are miserable.

## 2017-06-11 ENCOUNTER — Ambulatory Visit: Payer: Self-pay | Admitting: Family Medicine

## 2017-06-11 NOTE — Progress Notes (Signed)
Michaela Holland is a 36 y.o. female who presents to [facility] today for severe left knee pain. Michaela Holland is seen today for left anterior medial knee pain. She was seen last about a month ago for left knee pain thought to be related to degenerative joint disease and meniscal cysts with likely chronic meniscal tear. She had excellent results with a steroid injection and physical therapy. She was in her normal state of health when she was walking and felt a pop and had immediate onset of severe pain over the weekend. She notes that the pain is improving slowly but still quite bad. The patient is currently interfering with her ability to work.  Additionally patient has managed to lose over 10 pounds in the last month with increased exercise.    Past Medical History:  Diagnosis Date  . Anxiety   . Depression   . Diabetes mellitus without complication (HCC)   . Hypertension   . Obesity    Past Surgical History:  Procedure Laterality Date  . CESAREAN SECTION    . CHOLECYSTECTOMY     Social History  Substance Use Topics  . Smoking status: Never Smoker  . Smokeless tobacco: Never Used  . Alcohol use Yes     ROS:  As above   Medications: Current Outpatient Prescriptions  Medication Sig Dispense Refill  . clonazePAM (KLONOPIN) 0.5 MG tablet Take 0.5-1 tablets (0.25-0.5 mg total) by mouth 2 (two) times daily as needed for anxiety (use sparingly to prevent tolerance/dependence). 30 tablet 0  . colchicine 0.6 MG tablet Take 2 tabs (1.2mg ) followed by 1 tab (0.6mg ) 1 hour later. Then 1 tab (0.6mg ) daily until pain resolves 8 tablet 0  . diclofenac sodium (VOLTAREN) 1 % GEL Apply 4 g topically 4 (four) times daily. To affected joint. 100 g 11  . levothyroxine (SYNTHROID, LEVOTHROID) 25 MCG tablet Take 25 mcg by mouth daily before breakfast.    . metFORMIN (GLUCOPHAGE) 500 MG tablet Take 1,500 mg by mouth at bedtime.     Marland Kitchen omeprazole (PRILOSEC) 40  MG capsule Take 1 capsule (40 mg total) by mouth daily. 30 capsule 3  . Thyroid (LEVOTHYROXINE-LIOTHYRONINE) 15 MG TABS Take 15 mg by mouth daily.    . Thyroid (LEVOTHYROXINE-LIOTHYRONINE) 90 MG TABS Take 90 mg by mouth daily.     No current facility-administered medications for this visit.    Allergies  Allergen Reactions  . Sulfa Antibiotics Hives and Shortness Of Breath  . Other     Nuts severe reaction difficulty breathing , throat swelling     Exam:  BP 140/79   Pulse 76   Wt 284 lb (128.8 kg)   BMI 48.75 kg/m   Wt Readings from Last 5 Encounters:  06/10/17 284 lb (128.8 kg)  05/29/17 298 lb (135.2 kg)  05/08/17 297 lb (134.7 kg)  04/23/17 299 lb (135.6 kg)  04/09/17 294 lb (133.4 kg)    General: Well Developed, well nourished, and in no acute distress.  Neuro/Psych: Alert and oriented x3, extra-ocular muscles intact, able to move all 4 extremities, sensation grossly intact. Skin: Warm and dry, no rashes noted.  Respiratory: Not using accessory muscles, speaking in full sentences, trachea midline.  Cardiovascular: Pulses palpable, no extremity edema. Abdomen: Does not appear distended. MSK: Left knee no effusion. Range of motion 0-120. Tender palpation medial and lateral joint line. Negative anterior posterior drawer test. Negative valgus and varus stress test. Pain but no palpable pop with  McMurray's test. Intact flexion and extension strength.  Study Result   CLINICAL DATA:  Diffuse left knee pain x2 months after walking on uneven ground.  EXAM: MRI OF THE LEFT KNEE WITHOUT CONTRAST  TECHNIQUE: Multiplanar, multisequence MR imaging of the knee was performed. No intravenous contrast was administered.  COMPARISON:  None.  FINDINGS: MENISCI  Medial meniscus:  Intact.  Lateral meniscus: There is a small cluster of cysts adjacent to the anteromesial corner of the lateral meniscus, likely representing parameniscal cysts and inferring a meniscal  tear, likely off the anterior horn no discrete tear is not definitively identified in this location given the presence normal meniscal fascicles.  LIGAMENTS  Cruciates: Intrasubstance tear of the proximal fibers of the ACL, series 9, image 10 and series 5, image 11.  Collaterals: Medial collateral ligament is intact. Lateral collateral ligament complex is intact.  CARTILAGE  Patellofemoral: Mild fraying of the cartilage overlying the lateral patellar facet.  Medial: Mild partial-thickness cartilage loss of the medial femorotibial compartment.  Lateral: Moderate-to-marked chondromalacia of the lateral femorotibial compartment down to bone.  Joint:  No joint effusion. Normal Hoffa's fat. No plical thickening.  Popliteal Fossa: Trace fluid in the expected location of a Baker's cyst. Intact popliteus tendon.  Extensor Mechanism:  Intact quadriceps tendon and patellar tendon.  Bones:  Marrow edema of the anterior tibial plateau.  Other: Prepatellar tendon soft tissue edema.  IMPRESSION: 1. Partial intrasubstance tear of the proximal fibers of the ACL. 2. Anteromesial cluster of parameniscal cysts adjacent to the anterior horn of the lateral meniscus inferring a tear. Given the presence of meniscal fascicles, a discrete tear is difficult to identify. 3. Bone marrow edema of the anteromesial tibial plateau. 4. Chondromalacia of the femorotibial compartment more so laterally.   Electronically Signed   By: Tollie Eth M.D.   On: 05/04/2017 20:11        No results found for this or any previous visit (from the past 48 hour(s)). No results found.    Assessment and Plan: 36 y.o. female with left knee pain. Patient had sudden onset of pain that is now improving. I suspect she may have dislodged a loose body or pinched one of the meniscal cyst seen on MRI. I think there is a good chance that the pain settles down and she resumes her normal activity. I also  think there is a good chance that her pain and potential mechanical symptoms persist. Because her symptoms are bad enough that they interfere with her ability to work at time and she's had what we consider an adequate trial of conservative management I think it's reasonable to refer to orthopedic surgery to evaluate for arthroscopic debridement.  Work note provided refer to orthopedics. Reschedule anytime for injection if needed.    Orders Placed This Encounter  Procedures  . Ambulatory referral to Orthopedic Surgery    Referral Priority:   Routine    Referral Type:   Surgical    Referral Reason:   Specialty Services Required    Requested Specialty:   Orthopedic Surgery    Number of Visits Requested:   1   No orders of the defined types were placed in this encounter.   Discussed warning signs or symptoms. Please see discharge instructions. Patient expresses understanding.

## 2017-06-14 ENCOUNTER — Encounter: Payer: Self-pay | Admitting: Gastroenterology

## 2017-06-14 ENCOUNTER — Encounter: Payer: Self-pay | Admitting: Family Medicine

## 2017-06-14 ENCOUNTER — Ambulatory Visit (INDEPENDENT_AMBULATORY_CARE_PROVIDER_SITE_OTHER): Payer: 59 | Admitting: Family Medicine

## 2017-06-14 VITALS — BP 136/89 | HR 75

## 2017-06-14 DIAGNOSIS — M25562 Pain in left knee: Secondary | ICD-10-CM | POA: Diagnosis not present

## 2017-06-14 NOTE — Progress Notes (Signed)
Patient presents to clinic today for previously arranged left knee steroid injection. She has a follow-up appointment with Dr. Magnus Ivan for surgical evaluation on Monday. She notes the knee pain has been quite bad and she would like an injection today that was offered on Monday.  Procedure: Real-time Ultrasound Guided Injection of left knee  Device: GE Logiq E  Images permanently stored and available for review in the ultrasound unit. Verbal informed consent obtained. Discussed risks and benefits of procedure. Warned about infection bleeding damage to structures skin hypopigmentation and fat atrophy among others. Patient expresses understanding and agreement Time-out conducted.  Noted no overlying erythema, induration, or other signs of local infection.  Skin prepped in a sterile fashion.  Local anesthesia: Topical Ethyl chloride.  With sterile technique and under real time ultrasound guidance:  kenalog and 4ml marcaine injected easily.  Completed without difficulty  Pain immediately resolved suggesting accurate placement of the medication.  Advised to call if fevers/chills, erythema, induration, drainage, or persistent bleeding.  Images permanently stored and available for review in the ultrasound unit.  Impression: Technically successful ultrasound guided injection.

## 2017-06-17 ENCOUNTER — Ambulatory Visit (INDEPENDENT_AMBULATORY_CARE_PROVIDER_SITE_OTHER): Payer: 59 | Admitting: Orthopaedic Surgery

## 2017-06-17 DIAGNOSIS — S83282A Other tear of lateral meniscus, current injury, left knee, initial encounter: Secondary | ICD-10-CM

## 2017-06-17 DIAGNOSIS — M23007 Cystic meniscus, unspecified meniscus, left knee: Secondary | ICD-10-CM | POA: Diagnosis not present

## 2017-06-17 DIAGNOSIS — M25562 Pain in left knee: Secondary | ICD-10-CM

## 2017-06-17 NOTE — Progress Notes (Signed)
Office Visit Note   Patient: Michaela Holland           Date of Birth: 1981-02-08           MRN: [id number] Visit Date: 06/17/2017              Requested by: Rodolph Bong, MD [address] [place], [place] [address] PCP: Sunnie Nielsen, DO   Assessment & Plan: Visit Diagnoses:  1. Acute pain of left knee   2. Meniscal cyst, left   3. Other tear of lateral meniscus of left knee as current injury, initial encounter     Plan: I went over a knee model with her and talked her in detail about the possibility of arthroscopic intervention for her knee and she agrees with this as well. Given the meniscal cyst and the mechanical symptoms the lateral compartment that is indicative of a meniscal tear we would proceed with a knee arthroscopy to decompress the cyst and to take care of any meniscal tearing as well as assessment lateral compartment cartilage. I showed her knee modeling spent in detail with the surgery involves as well as a discussion of the risk and benefits of surgery. 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 She may bring his short-term disability have her to fill out. Preoperatively we may keep her out of work as well depending on how far out our surgery is booked and she understands this as well. I still talked about quad strength and exercises and weight loss. We will see her back in one week postoperative for suture removal. All questions and concerns were answered and addressed.  Meniscal cyst as well as mechanical symptoms  Follow-Up Instructions: Return for 1 week post-op.   Orders:  No orders of the defined types were placed in this encounter.  No orders of the defined types were placed in this encounter.     Procedures: No procedures performed   Clinical Data: No additional findings.   Subjective: No chief complaint on file. 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 She said acute on chronic knee problems with her left knee. She actually has plain films and MRI for me to review. She had an injection earlier this year of steroid in the left knee that helped greatly. She then had a traumatic event about a week or so ago when the knee popped and she had difficulty putting weight on her knee and got another injection. Her knee has not felt much better since that injection. She points to her left knee as the source for pain and does feel like the knee is unstable to her. She does work 12 hour shifts had a call out last week due to her knee discomfort.  HPI  Review of Systems She denies any headache, chest pain, short of breath, fever, chills, nausea, vomiting.  Objective: Vital Signs: There were no vitals taken for this visit.  Physical Exam She is Lavoie 3 and in no acute distress. She does walk with a limp. Ortho Exam On examination both knees hyperextend. She does have lateral joint line tenderness on the left knee and it hurts quite a bit with flexion past 90. The knee feels ligamentously stable. There is slight valgus malalignment when she stands but now when she sits. She  has a positive Murray sign the lateral compartment. Specialty Comments:  No specialty comments available.  Imaging: No results found. I was able to independently review plain films and the MRI of her left knee. On plain films or joint space is well-maintained. On MRI there is a large lateral para-meniscal cyst anteriorly indicative of possibly a lateral meniscal tear. There is areas of moderate cartilage loss in the lateral compartment of the knee as well. There is evidence that she may have had a partial anterior cruciate ligament tear at some point.  PMFS History: Patient Active Problem List   Diagnosis Date Noted  . Acute pain of left knee 06/17/2017  . Meniscal cyst, left 06/17/2017  . Cyst of lateral meniscus of  left knee 05/08/2017  . Anterior cruciate ligament sprain 05/08/2017  . Morbid obesity (HCC) 05/08/2017  . History of obstructive sleep apnea 04/09/2017  . Hypothyroidism 04/09/2017  . PCOS (polycystic ovarian syndrome) 04/09/2017  . Anxiety 04/09/2017  . Chronic pain of left knee 04/09/2017  . Gastroesophageal reflux disease with esophagitis 04/09/2017   Past Medical History:  Diagnosis Date  . Anxiety   . Depression   . Diabetes mellitus without complication (HCC)   . Hypertension   . Obesity     No family history on file.  Past Surgical History:  Procedure Laterality Date  . CESAREAN SECTION    . CHOLECYSTECTOMY     Social History   Occupational History  . Not on file.   Social History Main Topics  . Smoking status: Never Smoker  . Smokeless tobacco: Never Used  . Alcohol use Yes  . Drug use: No  . Sexual activity: Not on file

## 2017-06-21 NOTE — Progress Notes (Signed)
Please place orders in EPIC as patient is being scheduled for a pre-op appointment! Thank you! 

## 2017-06-24 ENCOUNTER — Other Ambulatory Visit (INDEPENDENT_AMBULATORY_CARE_PROVIDER_SITE_OTHER): Payer: Self-pay | Admitting: Orthopaedic Surgery

## 2017-06-24 ENCOUNTER — Telehealth: Payer: Self-pay | Admitting: Osteopathic Medicine

## 2017-06-24 ENCOUNTER — Encounter (INDEPENDENT_AMBULATORY_CARE_PROVIDER_SITE_OTHER): Payer: Self-pay | Admitting: Orthopaedic Surgery

## 2017-06-24 DIAGNOSIS — S83282A Other tear of lateral meniscus, current injury, left knee, initial encounter: Secondary | ICD-10-CM

## 2017-06-24 NOTE — Telephone Encounter (Signed)
Received [employer] forms, I assume these are to get her time off due to surgery. She needs to make sure her surgeon has these forms, I am not the appropriate person to complete this paperwork.

## 2017-06-24 NOTE — Progress Notes (Signed)
ECHO 01-13-16 epic care everywhere

## 2017-06-24 NOTE — Patient Instructions (Addendum)
Ashanta Amoroso Freelove  06/24/2017   Your procedure is scheduled on: 06-28-17  Report to [facility] Main  Entrance    Report to admitting at 11:45AM   Call this number if you have problems the morning of surgery  [phone]   Remember: ONLY 1 PERSON MAY GO WITH YOU TO SHORT STAY TO GET  READY MORNING OF YOUR SURGERY.  Do not eat food After Midnight. You may have clear liquids from midnight until 8:15am day of surgery. Nothing by mouth after 8:15am!!     Take these medicines the morning of surgery with A SIP OF WATER: clonazepam(klonopin) if needed, levothyroxine, thyroid armour ,                                 You may not have any metal on your body including hair pins and              piercings  Do not wear jewelry, make-up, lotions, powders or perfumes, deodorant             Do not wear nail polish.  Do not shave  48 hours prior to surgery.          Do not bring valuables to the hospital. [facility] IS NOT             RESPONSIBLE   FOR VALUABLES.  Contacts, dentures or bridgework may not be worn into surgery.      Patients discharged the day of surgery will not be allowed to drive home.  Name and phone number of your driver:  Special Instructions: N/A              Please read over the following fact sheets you were given: _____________________________________________________________________   CLEAR LIQUID DIET   Foods Allowed                                                                     Foods Excluded  Coffee and tea, regular and decaf                             liquids that you cannot  Plain Jell-O in any flavor                                             see through such as: Fruit ices (not with fruit pulp)                                     milk, soups, orange juice  Iced Popsicles                                    All solid food Carbonated beverages, regular and diet  Cranberry, grape and apple  juices Sports drinks like Gatorade Lightly seasoned clear broth or consume(fat free) Sugar, honey syrup  Sample Menu Breakfast                                Lunch                                     Supper Cranberry juice                    Beef broth                            Chicken broth Jell-O                                     Grape juice                           Apple juice Coffee or tea                        Jell-O                                      Popsicle                                                Coffee or tea                        Coffee or tea  _____________________________________________________________________   [facility] - Preparing for Surgery Before surgery, you can play an important role.  Because skin is not sterile, your skin needs to be as free of germs as possible.  You can reduce the number of germs on your skin by washing with CHG (chlorahexidine gluconate) soap before surgery.  CHG is an antiseptic cleaner which kills germs and bonds with the skin to continue killing germs even after washing. Please DO NOT use if you have an allergy to CHG or antibacterial soaps.  If your skin becomes reddened/irritated stop using the CHG and inform your nurse when you arrive at Short Stay. Do not shave (including legs and underarms) for at least 48 hours prior to the first CHG shower.  You may shave your face/neck. Please follow these instructions carefully:  1.  Shower with CHG Soap the night before surgery and the  morning of Surgery.  2.  If you choose to wash your hair, wash your hair first as usual with your  normal  shampoo.  3.  After you shampoo, rinse your hair and body thoroughly to remove the  shampoo.                           4.  Use CHG as you would any other liquid soap.  You can apply chg directly  to the skin and wash  Gently with a scrungie or clean washcloth.  5.  Apply the CHG Soap to your body ONLY FROM THE NECK DOWN.   Do not  use on face/ open                           Wound or open sores. Avoid contact with eyes, ears mouth and genitals (private parts).                       Wash face,  Genitals (private parts) with your normal soap.             6.  Wash thoroughly, paying special attention to the area where your surgery  will be performed.  7.  Thoroughly rinse your body with warm water from the neck down.  8.  DO NOT shower/wash with your normal soap after using and rinsing off  the CHG Soap.                9.  Pat yourself dry with a clean towel.            10.  Wear clean pajamas.            11.  Place clean sheets on your bed the night of your first shower and do not  sleep with pets. Day of Surgery : Do not apply any lotions/deodorants the morning of surgery.  Please wear clean clothes to the hospital/surgery center.  FAILURE TO FOLLOW THESE INSTRUCTIONS MAY RESULT IN THE CANCELLATION OF YOUR SURGERY PATIENT SIGNATURE_________________________________  NURSE SIGNATURE__________________________________  ________________________________________________________________________

## 2017-06-24 NOTE — Telephone Encounter (Signed)
Patient has been informed. Tamara Monteith,CMA  

## 2017-06-25 ENCOUNTER — Encounter (HOSPITAL_COMMUNITY): Payer: Self-pay

## 2017-06-25 ENCOUNTER — Other Ambulatory Visit (INDEPENDENT_AMBULATORY_CARE_PROVIDER_SITE_OTHER): Payer: Self-pay | Admitting: Orthopaedic Surgery

## 2017-06-25 ENCOUNTER — Encounter (HOSPITAL_COMMUNITY)
Admission: RE | Admit: 2017-06-25 | Discharge: 2017-06-25 | Disposition: A | Discharge status: Home / Self Care | Payer: 59 | Source: Ambulatory Visit | Attending: Orthopaedic Surgery | Admitting: Orthopaedic Surgery

## 2017-06-25 ENCOUNTER — Other Ambulatory Visit: Payer: Self-pay

## 2017-06-25 DIAGNOSIS — Z79899 Other long term (current) drug therapy: Secondary | ICD-10-CM | POA: Diagnosis not present

## 2017-06-25 DIAGNOSIS — F419 Anxiety disorder, unspecified: Secondary | ICD-10-CM | POA: Diagnosis not present

## 2017-06-25 DIAGNOSIS — E039 Hypothyroidism, unspecified: Secondary | ICD-10-CM | POA: Diagnosis not present

## 2017-06-25 DIAGNOSIS — G4730 Sleep apnea, unspecified: Secondary | ICD-10-CM | POA: Diagnosis not present

## 2017-06-25 DIAGNOSIS — I10 Essential (primary) hypertension: Secondary | ICD-10-CM | POA: Diagnosis not present

## 2017-06-25 DIAGNOSIS — Z6841 Body Mass Index (BMI) 40.0 and over, adult: Secondary | ICD-10-CM | POA: Diagnosis not present

## 2017-06-25 DIAGNOSIS — M94262 Chondromalacia, left knee: Secondary | ICD-10-CM | POA: Diagnosis not present

## 2017-06-25 DIAGNOSIS — M659 Synovitis and tenosynovitis, unspecified: Secondary | ICD-10-CM | POA: Diagnosis not present

## 2017-06-25 DIAGNOSIS — Z7984 Long term (current) use of oral hypoglycemic drugs: Secondary | ICD-10-CM | POA: Diagnosis not present

## 2017-06-25 DIAGNOSIS — F329 Major depressive disorder, single episode, unspecified: Secondary | ICD-10-CM | POA: Diagnosis not present

## 2017-06-25 DIAGNOSIS — M109 Gout, unspecified: Secondary | ICD-10-CM | POA: Diagnosis not present

## 2017-06-25 DIAGNOSIS — K219 Gastro-esophageal reflux disease without esophagitis: Secondary | ICD-10-CM | POA: Diagnosis not present

## 2017-06-25 DIAGNOSIS — M25562 Pain in left knee: Secondary | ICD-10-CM | POA: Diagnosis present

## 2017-06-25 HISTORY — DX: Hypothyroidism, unspecified: E03.9

## 2017-06-25 HISTORY — DX: Other specified postprocedural states: R11.2

## 2017-06-25 HISTORY — DX: Gout, unspecified: M10.9

## 2017-06-25 HISTORY — DX: Unspecified convulsions: R56.9

## 2017-06-25 HISTORY — DX: Family history of other specified conditions: Z84.89

## 2017-06-25 HISTORY — DX: Other seasonal allergic rhinitis: J30.2

## 2017-06-25 HISTORY — DX: Gastro-esophageal reflux disease without esophagitis: K21.9

## 2017-06-25 HISTORY — DX: Other specified postprocedural states: Z98.890

## 2017-06-25 HISTORY — DX: Polycystic ovarian syndrome: E28.2

## 2017-06-25 HISTORY — DX: Palpitations: R00.2

## 2017-06-25 HISTORY — DX: Sleep apnea, unspecified: G47.30

## 2017-06-25 LAB — CBC
HCT: 41.2 % (ref 36.0–46.0)
Hemoglobin: 13.2 g/dL (ref 12.0–15.0)
MCH: 27.6 pg (ref 26.0–34.0)
MCHC: 32 g/dL (ref 30.0–36.0)
MCV: 86.2 fL (ref 78.0–100.0)
PLATELETS: 327 10*3/uL (ref 150–400)
RBC: 4.78 MIL/uL (ref 3.87–5.11)
RDW: 13.2 % (ref 11.5–15.5)
WBC: 8.4 10*3/uL (ref 4.0–10.5)

## 2017-06-25 LAB — BASIC METABOLIC PANEL
Anion gap: 6 (ref 5–15)
BUN: 9 mg/dL (ref 6–20)
CO2: 29 mmol/L (ref 22–32)
CREATININE: 0.59 mg/dL (ref 0.44–1.00)
Calcium: 8.9 mg/dL (ref 8.9–10.3)
Chloride: 104 mmol/L (ref 101–111)
GFR calc Af Amer: >60 mL/min (ref >60)
GLUCOSE: 92 mg/dL (ref 65–99)
POTASSIUM: 4.2 mmol/L (ref 3.5–5.1)
Sodium: 139 mmol/L (ref 135–145)

## 2017-06-25 LAB — HCG, SERUM, QUALITATIVE: Preg, Serum: NEGATIVE

## 2017-06-26 ENCOUNTER — Ambulatory Visit (INDEPENDENT_AMBULATORY_CARE_PROVIDER_SITE_OTHER): Payer: Self-pay | Admitting: Psychiatry

## 2017-06-26 ENCOUNTER — Encounter (HOSPITAL_COMMUNITY): Payer: Self-pay | Admitting: Psychiatry

## 2017-06-26 VITALS — BP 128/74 | HR 88 | Ht 64.0 in | Wt 300.6 lb

## 2017-06-26 DIAGNOSIS — M549 Dorsalgia, unspecified: Secondary | ICD-10-CM

## 2017-06-26 DIAGNOSIS — R4584 Anhedonia: Secondary | ICD-10-CM

## 2017-06-26 DIAGNOSIS — K5900 Constipation, unspecified: Secondary | ICD-10-CM

## 2017-06-26 DIAGNOSIS — R632 Polyphagia: Secondary | ICD-10-CM

## 2017-06-26 DIAGNOSIS — F411 Generalized anxiety disorder: Secondary | ICD-10-CM

## 2017-06-26 DIAGNOSIS — F4010 Social phobia, unspecified: Secondary | ICD-10-CM

## 2017-06-26 DIAGNOSIS — Z566 Other physical and mental strain related to work: Secondary | ICD-10-CM

## 2017-06-26 DIAGNOSIS — R450 Nervousness: Secondary | ICD-10-CM

## 2017-06-26 DIAGNOSIS — R5383 Other fatigue: Secondary | ICD-10-CM

## 2017-06-26 DIAGNOSIS — G4710 Hypersomnia, unspecified: Secondary | ICD-10-CM

## 2017-06-26 DIAGNOSIS — F331 Major depressive disorder, recurrent, moderate: Secondary | ICD-10-CM

## 2017-06-26 DIAGNOSIS — R4581 Low self-esteem: Secondary | ICD-10-CM

## 2017-06-26 DIAGNOSIS — R12 Heartburn: Secondary | ICD-10-CM

## 2017-06-26 DIAGNOSIS — R5381 Other malaise: Secondary | ICD-10-CM

## 2017-06-26 DIAGNOSIS — R454 Irritability and anger: Secondary | ICD-10-CM

## 2017-06-26 MED ORDER — FLUOXETINE HCL 10 MG PO CAPS: 10.0000 mg | ORAL_CAPSULE | Freq: Every day | ORAL | 0 refills | Status: DC

## 2017-06-26 MED ORDER — FLUOXETINE HCL 20 MG PO CAPS: 20.0000 mg | ORAL_CAPSULE | Freq: Every day | ORAL | 1 refills | Status: DC

## 2017-06-26 MED FILL — FLUoxetine HCL 10 MG CAPS: 10 | 7 days supply | Qty: 7 | Fill #0

## 2017-06-26 MED FILL — FLUoxetine HCL 20 MG CAPS: 20 | 90 days supply | Qty: 90 | Fill #0

## 2017-06-26 NOTE — Progress Notes (Signed)
Psychiatric Initial Adult Assessment   Patient Identification: Michaela Holland MRN:  [id number] Date of Evaluation:  06/26/2017 Referral Source: self Chief Complaint:  stress eating, anxiety, depression Visit Diagnosis:    ICD-10-CM   1. Moderate episode of recurrent major depressive disorder (HCC) F33.1 FLUoxetine (PROZAC) 10 MG capsule    FLUoxetine (PROZAC) 20 MG capsule  2. Generalized anxiety disorder F41.1   3. 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  She is interested in therapy as well.  Reports that she has suffered with bouts of depression several times over the years. Has had a fair response to bupropion in the past but it tended to exacerbated anxiety. Reports that the past 6 months she has been more down, poor self esteem, low energy, rumination, and depression.  Tends to feel more irritable towards husband.  Has bouts of stress eating when she is more upset, more sleep when down.   She denies any current SI, but has had passive SI in the past.  She has no prior attempts. She has tried to increase her exercise, listen to music, spend time with family, go on walks, but her mood has continued to decline.  She attributes some of her mood to adjustment related to new job, recent move from [place], and adjusting to managing full time work and mom to 36 year old daughter.  She has associated worry and period symptoms of panic and increased anxiety. These tend to be predictable and associated with work stressors.    Spent time discussing medication management options and education patient on benefits of individual therapy.  Of note, she admits approximately 30 % adherence to her Thyroid supplementation. Education her on thyroid disease and how this can mimic mdd symptoms as well.  She was receptive to suggestions on improving adherence.  Associated Signs/Symptoms: Depression  Symptoms:  depressed mood, anhedonia, hypersomnia, psychomotor retardation, fatigue, difficulty concentrating, hopelessness, anxiety, weight gain, increased appetite, (Hypo) Manic Symptoms:  Irritable Mood, Anxiety Symptoms:  Excessive Worry, Social Anxiety, Psychotic Symptoms:  none PTSD Symptoms: Negative  Past Psychiatric History: no past hospitalizations, previously psychiatric med management in [place]   Previous Psychotropic Medications: Yes   Substance Abuse History in the last 12 months:  No.  Consequences of Substance Abuse: Negative  Past Medical History:  Past Medical History:  Diagnosis Date  . Anxiety   . Depression   . Diabetes mellitus without complication (HCC)    denies this diagnosis   . Family history of adverse reaction to anesthesia    per patient ; father was having abdominal surgery and had to be placed on medicine to keep his blood pressure up; he never had any issues with low BP before the surgery "   . GERD (gastroesophageal reflux disease)   . Gout    believes only occurred once in feet;   . Hypertension    denies this diagnosis   . Hypothyroidism   . Obesity   . Palpitations   . PCOS (polycystic ovarian syndrome)   . PONV (postoperative nausea and vomiting)    with c section; no issues with followwing procedures   . Seasonal allergies   . Seizures (HCC)    has had "pre-seizure" activity in the brain but deneis any actual seizures   . Sleep apnea    does not currently use due to insurance  Past Surgical History:  Procedure Laterality Date  . CESAREAN SECTION    . CHOLECYSTECTOMY    . ESOPHAGOGASTRODUODENOSCOPY  2001 and 2018  . GANGLION CYST EXCISION Left 2014   foot    Family Psychiatric History: none  Family History: History reviewed. No pertinent family history.  Social History:   Social History   Social History  . Marital status: Married    Spouse name: N/A  . Number of children: N/A  . Years of education: N/A    Social History Main Topics  . Smoking status: Never Smoker  . Smokeless tobacco: Never Used  . Alcohol use Yes     Comment: occasioanlly   . Drug use: No  . Sexual activity: Yes    Birth control/ protection: None   Other Topics Concern  . None   Social History Narrative  . None    Additional Social History: RN as Administrator, Civil Service, husband is a Education officer, environmental, 77 year old daughter, family lives in [place]   Allergies:   Allergies  Allergen Reactions  . Sulfa Antibiotics Hives and Shortness Of Breath  . Other     Nuts severe reaction difficulty breathing , throat swelling; mostly tree nuts but not all tree nuts     Metabolic Disorder Labs: No results found for: HGBA1C, MPG No results found for: PROLACTIN No results found for: CHOL, TRIG, HDL, CHOLHDL, VLDL, LDLCALC   Current Medications: Current Outpatient Prescriptions  Medication Sig Dispense Refill  . clonazePAM (KLONOPIN) 0.5 MG tablet Take 0.5-1 tablets (0.25-0.5 mg total) by mouth 2 (two) times daily as needed for anxiety (use sparingly to prevent tolerance/dependence). (Patient taking differently: Take 0.5 mg by mouth 2 (two) times daily as needed for anxiety (use sparingly to prevent tolerance/dependence). ) 30 tablet 0  . colchicine 0.6 MG tablet Take 2 tabs (1.2mg ) followed by 1 tab (0.6mg ) 1 hour later. Then 1 tab (0.6mg ) daily until pain resolves (Patient taking differently: Take 0.6-1.2 mg by mouth 2 (two) times daily as needed (for gout flare up). ) 8 tablet 0  . diclofenac sodium (VOLTAREN) 1 % GEL Apply 4 g topically 4 (four) times daily. To affected joint. (Patient taking differently: Apply 4 g topically 4 (four) times daily as needed (for joint pain.). ) 100 g 11  . ibuprofen (ADVIL,MOTRIN) 200 MG tablet Take 800 mg by mouth every 8 (eight) hours as needed (for pain.).    Marland Kitchen levothyroxine (SYNTHROID, LEVOTHROID) 50 MCG tablet Take 50 mcg by mouth daily before breakfast.    . metFORMIN (GLUCOPHAGE-XR) 500 MG 24 hr  tablet Take 1,500 mg by mouth at bedtime.    Marland Kitchen omeprazole (PRILOSEC) 40 MG capsule Take 1 capsule (40 mg total) by mouth daily. (Patient taking differently: Take 40 mg by mouth every evening. ) 30 capsule 3  . ranitidine (ZANTAC) 150 MG tablet Take 150 mg by mouth daily as needed for heartburn.     . thyroid (ARMOUR THYROID) 90 MG tablet Take 90 mg by mouth daily.    Marland Kitchen thyroid (ARMOUR) 15 MG tablet Take 15 mg by mouth daily.    Marland Kitchen FLUoxetine (PROZAC) 10 MG capsule Take 1 capsule (10 mg total) by mouth daily. 10 mg daily x 1 week, then increase to 20 mg 7 capsule 0  . FLUoxetine (PROZAC) 20 MG capsule Take 1 capsule (20 mg total) by mouth daily. 90 capsule 1   No current facility-administered medications for this visit.     Neurologic: Headache: Negative Seizure: Negative Paresthesias:Negative  Musculoskeletal: Strength & Muscle Tone: within normal limits Gait & Station: normal Patient leans: N/A  Psychiatric Specialty Exam: Review of Systems  Constitutional: Positive for malaise/fatigue.  HENT: Negative.   Eyes: Negative.   Respiratory: Negative.   Cardiovascular: Negative.   Gastrointestinal: Positive for constipation and heartburn.  Musculoskeletal: Positive for back pain.  Neurological: Negative.   Endo/Heme/Allergies: Negative.   Psychiatric/Behavioral: Positive for depression. The patient is nervous/anxious.     Blood pressure 128/74, pulse 88, height 5\' 4"  (1.626 m), weight (!) 300 lb 9.6 oz (136.4 kg), last menstrual period 06/12/2017.Body mass index is 51.6 kg/m.  General Appearance: Casual and Fairly Groomed  Eye Contact:  Fair  Speech:  Clear and Coherent  Volume:  Normal  Mood:  Anxious and Depressed  Affect:  Congruent and Depressed  Thought Process:  Goal Directed and Descriptions of Associations: Intact  Orientation:  Full (Time, Place, and Person)  Thought Content:  Logical  Suicidal Thoughts:  No  Homicidal Thoughts:  No  Memory:  Recent;   Good   Judgement:  Fair  Insight:  Fair  Psychomotor Activity:  Normal  Concentration:  Attention Span: Good  Recall:  Fair  Fund of Knowledge:Good  Language: Good  Akathisia:  Negative  Handed:  Right  AIMS (if indicated):  0  Assets:  Communication Skills Desire for Improvement Financial Resources/Insurance Housing Intimacy Leisure Time Resilience Social Support Talents/Skills Transportation Vocational/Educational  ADL's:  Intact  Cognition: WNL  Sleep:  7-10 hours    Treatment Plan Summary: Michaela Holland is a 36 year old female with thyroid disease, nonadherent to thyroid supplementation, and a history of mdd.  Presents with worsening depression over the past 6 months in the context of recent move, job change, and adjusting to full time work +parenting. She does not have any acute safety issues. I spent time today educating her on medication management, SSRI therapy, thyroid disease and depression interaction, and discussing therapy options.  She agrees to proceed as below and RTC in 8 weeks.  1. Moderate episode of recurrent major depressive disorder (HCC)   2. Generalized anxiety disorder   3. Overeating     Status of current problems: gradually worsening  Labs Ordered: No orders of the defined types were placed in this encounter.  Labs Reviewed:  Lab Results  Component Value Date   WBC 8.4 06/25/2017   HGB 13.2 06/25/2017   HCT 41.2 06/25/2017   MCV 86.2 06/25/2017   PLT 327 06/25/2017     Collateral Obtained/Records Reviewed: none  Plan:  Initiate prozac 10 mg x 1 week, then increase to 20 mg. Reviewed common side effects including GI, headache, dry mouth Encourage adherance to thyroid therapies Discourage clonazepam use RTC 8 weeks  I spent 45 minutes with the patient in direct face-to-face clinical care.  Greater than 50% of this time was spent in counseling and coordination of care with the patient.   Burnard Leigh, MD 10/17/20182:57 PM

## 2017-06-27 ENCOUNTER — Telehealth (INDEPENDENT_AMBULATORY_CARE_PROVIDER_SITE_OTHER): Payer: Self-pay | Admitting: Orthopaedic Surgery

## 2017-06-27 MED ORDER — CEFAZOLIN SODIUM 10 G IJ SOLR
3.0000 g | INTRAMUSCULAR | Status: DC
  Filled 2017-06-27: qty 3000

## 2017-06-27 NOTE — Telephone Encounter (Signed)
Patient lmvm stating she signed release for records to be [facility] and needs records faxed as soon as possible. I called her back and lmvm advising that I do not have a release from her to fax records. Said that if she had forms, the forms and the auth she signed [facility]. Left her ph#  to contact [facility]

## 2017-06-28 ENCOUNTER — Encounter (HOSPITAL_COMMUNITY)
Admission: RE | Disposition: A | Discharge status: Home / Self Care | Payer: Self-pay | Source: Ambulatory Visit | Attending: Orthopaedic Surgery

## 2017-06-28 ENCOUNTER — Ambulatory Visit (HOSPITAL_COMMUNITY)
Admission: RE | Admit: 2017-06-28 | Discharge: 2017-06-28 | Disposition: A | Discharge status: Home / Self Care | Payer: 59 | Source: Ambulatory Visit | Attending: Orthopaedic Surgery | Admitting: Orthopaedic Surgery

## 2017-06-28 ENCOUNTER — Encounter (HOSPITAL_COMMUNITY): Payer: Self-pay | Admitting: Anesthesiology

## 2017-06-28 ENCOUNTER — Ambulatory Visit (HOSPITAL_COMMUNITY): Payer: 59 | Admitting: Anesthesiology

## 2017-06-28 DIAGNOSIS — F329 Major depressive disorder, single episode, unspecified: Secondary | ICD-10-CM | POA: Insufficient documentation

## 2017-06-28 DIAGNOSIS — Z6841 Body Mass Index (BMI) 40.0 and over, adult: Secondary | ICD-10-CM | POA: Insufficient documentation

## 2017-06-28 DIAGNOSIS — M659 Synovitis and tenosynovitis, unspecified: Secondary | ICD-10-CM | POA: Diagnosis not present

## 2017-06-28 DIAGNOSIS — M23007 Cystic meniscus, unspecified meniscus, left knee: Secondary | ICD-10-CM | POA: Diagnosis not present

## 2017-06-28 DIAGNOSIS — E039 Hypothyroidism, unspecified: Secondary | ICD-10-CM | POA: Diagnosis not present

## 2017-06-28 DIAGNOSIS — F419 Anxiety disorder, unspecified: Secondary | ICD-10-CM | POA: Insufficient documentation

## 2017-06-28 DIAGNOSIS — K219 Gastro-esophageal reflux disease without esophagitis: Secondary | ICD-10-CM | POA: Diagnosis not present

## 2017-06-28 DIAGNOSIS — Z79899 Other long term (current) drug therapy: Secondary | ICD-10-CM | POA: Insufficient documentation

## 2017-06-28 DIAGNOSIS — G4730 Sleep apnea, unspecified: Secondary | ICD-10-CM | POA: Insufficient documentation

## 2017-06-28 DIAGNOSIS — Z7984 Long term (current) use of oral hypoglycemic drugs: Secondary | ICD-10-CM | POA: Insufficient documentation

## 2017-06-28 DIAGNOSIS — R269 Unspecified abnormalities of gait and mobility: Secondary | ICD-10-CM | POA: Diagnosis not present

## 2017-06-28 DIAGNOSIS — S83282A Other tear of lateral meniscus, current injury, left knee, initial encounter: Secondary | ICD-10-CM

## 2017-06-28 DIAGNOSIS — K210 Gastro-esophageal reflux disease with esophagitis: Secondary | ICD-10-CM | POA: Diagnosis not present

## 2017-06-28 DIAGNOSIS — I10 Essential (primary) hypertension: Secondary | ICD-10-CM | POA: Insufficient documentation

## 2017-06-28 DIAGNOSIS — M94262 Chondromalacia, left knee: Secondary | ICD-10-CM | POA: Insufficient documentation

## 2017-06-28 DIAGNOSIS — M23001 Cystic meniscus, unspecified lateral meniscus, left knee: Secondary | ICD-10-CM | POA: Diagnosis not present

## 2017-06-28 DIAGNOSIS — M109 Gout, unspecified: Secondary | ICD-10-CM | POA: Insufficient documentation

## 2017-06-28 HISTORY — PX: KNEE ARTHROSCOPY: SHX127

## 2017-06-28 LAB — GLUCOSE, CAPILLARY: Glucose-Capillary: 82 mg/dL (ref 65–99)

## 2017-06-28 SURGERY — ARTHROSCOPY, KNEE
Anesthesia: General | Site: Knee | Laterality: Left

## 2017-06-28 MED ORDER — HYDROMORPHONE HCL-NACL 0.5-0.9 MG/ML-% IV SOSY: 0.2500 mg | PREFILLED_SYRINGE | INTRAVENOUS | Status: DC | PRN

## 2017-06-28 MED ORDER — BUPIVACAINE HCL (PF) 0.5 % IJ SOLN
INTRAMUSCULAR | Status: DC | PRN
  Administered 2017-06-28: 30 mL

## 2017-06-28 MED ORDER — LIDOCAINE 2% (20 MG/ML) 5 ML SYRINGE
INTRAMUSCULAR | Status: DC | PRN
  Administered 2017-06-28: 100 mg via INTRAVENOUS

## 2017-06-28 MED ORDER — MORPHINE SULFATE (PF) 4 MG/ML IV SOLN
INTRAVENOUS | Status: DC | PRN
  Administered 2017-06-28: 4 mg via INTRAMUSCULAR

## 2017-06-28 MED ORDER — PROPOFOL 10 MG/ML IV BOLUS
INTRAVENOUS | Status: DC | PRN
  Administered 2017-06-28: 200 mg via INTRAVENOUS
  Administered 2017-06-28: 100 mg via INTRAVENOUS

## 2017-06-28 MED ORDER — HYDROCODONE-ACETAMINOPHEN 7.5-325 MG PO TABS: 1.0000 | ORAL_TABLET | Freq: Once | ORAL | Status: DC | PRN

## 2017-06-28 MED ORDER — SCOPOLAMINE 1 MG/3DAYS TD PT72
1.0000 | MEDICATED_PATCH | Freq: Once | TRANSDERMAL | Status: DC
  Administered 2017-06-28: 1.5 mg via TRANSDERMAL

## 2017-06-28 MED ORDER — MEPERIDINE HCL 50 MG/ML IJ SOLN: 6.2500 mg | INTRAMUSCULAR | Status: DC | PRN

## 2017-06-28 MED ORDER — DEXAMETHASONE SODIUM PHOSPHATE 10 MG/ML IJ SOLN
INTRAMUSCULAR | Status: DC | PRN
  Administered 2017-06-28: 10 mg via INTRAVENOUS

## 2017-06-28 MED ORDER — ONDANSETRON HCL 4 MG/2ML IJ SOLN
INTRAMUSCULAR | Status: DC | PRN
  Administered 2017-06-28: 4 mg via INTRAVENOUS

## 2017-06-28 MED ORDER — OXYCODONE-ACETAMINOPHEN 5-325 MG PO TABS: 1.0000 | ORAL_TABLET | ORAL | 0 refills | Status: DC | PRN

## 2017-06-28 MED ORDER — OXYCODONE HCL 5 MG PO TABS
5.0000 mg | ORAL_TABLET | ORAL | Status: AC | PRN
  Administered 2017-06-28 (×2): 5 mg via ORAL
  Filled 2017-06-28: qty 1

## 2017-06-28 MED ORDER — SCOPOLAMINE 1 MG/3DAYS TD PT72
MEDICATED_PATCH | TRANSDERMAL | Status: AC
  Filled 2017-06-28: qty 1

## 2017-06-28 MED ORDER — MORPHINE SULFATE (PF) 4 MG/ML IV SOLN
INTRAVENOUS | Status: AC
  Filled 2017-06-28: qty 1

## 2017-06-28 MED ORDER — OXYCODONE HCL 5 MG PO TABS
ORAL_TABLET | ORAL | Status: AC
  Filled 2017-06-28: qty 1

## 2017-06-28 MED ORDER — PROPOFOL 10 MG/ML IV BOLUS
INTRAVENOUS | Status: AC
  Filled 2017-06-28: qty 40

## 2017-06-28 MED ORDER — MIDAZOLAM HCL 5 MG/5ML IJ SOLN
INTRAMUSCULAR | Status: DC | PRN
  Administered 2017-06-28: 2 mg via INTRAVENOUS

## 2017-06-28 MED ORDER — LACTATED RINGERS IV SOLN
INTRAVENOUS | Status: DC
  Administered 2017-06-28: 1000 mL via INTRAVENOUS

## 2017-06-28 MED ORDER — MIDAZOLAM HCL 2 MG/2ML IJ SOLN
INTRAMUSCULAR | Status: AC
  Filled 2017-06-28: qty 2

## 2017-06-28 MED ORDER — FENTANYL CITRATE (PF) 100 MCG/2ML IJ SOLN
INTRAMUSCULAR | Status: AC
  Filled 2017-06-28: qty 2

## 2017-06-28 MED ORDER — DEXAMETHASONE SODIUM PHOSPHATE 10 MG/ML IJ SOLN
INTRAMUSCULAR | Status: AC
  Filled 2017-06-28: qty 1

## 2017-06-28 MED ORDER — METOCLOPRAMIDE HCL 5 MG/ML IJ SOLN: 10.0000 mg | Freq: Once | INTRAMUSCULAR | Status: DC | PRN

## 2017-06-28 MED ORDER — ONDANSETRON HCL 4 MG/2ML IJ SOLN
INTRAMUSCULAR | Status: AC
  Filled 2017-06-28: qty 2

## 2017-06-28 MED ORDER — LIDOCAINE 2% (20 MG/ML) 5 ML SYRINGE
INTRAMUSCULAR | Status: AC
  Filled 2017-06-28: qty 5

## 2017-06-28 MED ORDER — FENTANYL CITRATE (PF) 100 MCG/2ML IJ SOLN
INTRAMUSCULAR | Status: DC | PRN
  Administered 2017-06-28: 50 ug via INTRAVENOUS
  Administered 2017-06-28: 25 ug via INTRAVENOUS
  Administered 2017-06-28 (×2): 50 ug via INTRAVENOUS
  Administered 2017-06-28: 25 ug via INTRAVENOUS

## 2017-06-28 MED ORDER — LACTATED RINGERS IV SOLN
INTRAVENOUS | Status: DC | PRN
  Administered 2017-06-28: 12:00:00 via INTRAVENOUS

## 2017-06-28 MED ORDER — BUPIVACAINE HCL (PF) 0.5 % IJ SOLN
INTRAMUSCULAR | Status: AC
  Filled 2017-06-28: qty 30

## 2017-06-28 MED FILL — OXYCOD/ACETAMINOPHEN 5-325M: 5-325 | 5 days supply | Qty: 60 | Fill #0

## 2017-06-28 SURGICAL SUPPLY — 30 items
BANDAGE ACE 6X5 VEL STRL LF (GAUZE/BANDAGES/DRESSINGS) ×2 IMPLANT
BLADE CUDA SHAVER 3.5 (BLADE) ×2 IMPLANT
BNDG GAUZE ELAST 4 BULKY (GAUZE/BANDAGES/DRESSINGS) ×2 IMPLANT
COVER SURGICAL LIGHT HANDLE (MISCELLANEOUS) ×2 IMPLANT
CUFF TOURN SGL QUICK 34 (TOURNIQUET CUFF)
CUFF TRNQT CYL 34X4X40X1 (TOURNIQUET CUFF) IMPLANT
DRAPE U-SHAPE 47X51 STRL (DRAPES) ×2 IMPLANT
DRSG PAD ABDOMINAL 8X10 ST (GAUZE/BANDAGES/DRESSINGS) ×2 IMPLANT
DURAPREP 26ML APPLICATOR (WOUND CARE) ×2 IMPLANT
GAUZE SPONGE 4X4 12PLY STRL (GAUZE/BANDAGES/DRESSINGS) ×2 IMPLANT
GAUZE XEROFORM 1X8 LF (GAUZE/BANDAGES/DRESSINGS) ×2 IMPLANT
GLOVE BIO SURGEON STRL SZ7.5 (GLOVE) ×2 IMPLANT
GLOVE BIOGEL PI IND STRL 8 (GLOVE) ×2 IMPLANT
GLOVE BIOGEL PI INDICATOR 8 (GLOVE) ×2
GLOVE ECLIPSE 8.0 STRL XLNG CF (GLOVE) ×2 IMPLANT
GOWN STRL REUS W/TWL XL LVL3 (GOWN DISPOSABLE) ×4 IMPLANT
IV LACTATED RINGER IRRG 3000ML (IV SOLUTION) ×2
IV LR IRRIG 3000ML ARTHROMATIC (IV SOLUTION) ×2 IMPLANT
KIT BASIN OR (CUSTOM PROCEDURE TRAY) ×2 IMPLANT
MANIFOLD NEPTUNE II (INSTRUMENTS) ×2 IMPLANT
NEEDLE HYPO 22GX1.5 SAFETY (NEEDLE) ×2 IMPLANT
PACK ARTHROSCOPY WL (CUSTOM PROCEDURE TRAY) ×2 IMPLANT
PAD ABD 8X10 STRL (GAUZE/BANDAGES/DRESSINGS) ×2 IMPLANT
PADDING CAST COTTON 6X4 STRL (CAST SUPPLIES) ×2 IMPLANT
SUT ETHILON 4 0 PS 2 18 (SUTURE) ×2 IMPLANT
SYR CONTROL 10ML LL (SYRINGE) ×2 IMPLANT
TOWEL OR 17X26 10 PK STRL BLUE (TOWEL DISPOSABLE) ×2 IMPLANT
TUBING ARTHRO INFLOW-ONLY STRL (TUBING) ×2 IMPLANT
WAND HAND CNTRL MULTIVAC 90 (MISCELLANEOUS) IMPLANT
WRAP KNEE MAXI GEL POST OP (GAUZE/BANDAGES/DRESSINGS) ×2 IMPLANT

## 2017-06-28 NOTE — Anesthesia Postprocedure Evaluation (Signed)
Anesthesia Post Note  Patient: Michaela Holland  Procedure(s) Performed: LEFT KNEE ARTHROSCOPY with debridement (Left Knee)     Patient location during evaluation: PACU Anesthesia Type: General Level of consciousness: awake and alert Pain management: pain level controlled Vital Signs Assessment: post-procedure vital signs reviewed and stable Respiratory status: spontaneous breathing, nonlabored ventilation and respiratory function stable Cardiovascular status: blood pressure returned to baseline and stable Postop Assessment: no apparent nausea or vomiting Anesthetic complications: no    Last Vitals:  Vitals:   06/28/17 1445 06/28/17 1505  BP: 139/84 (!) 136/101  Pulse: 79 81  Resp: 13 16  Temp:  36.6 C  SpO2: 100% 100%    Last Pain:  Vitals:   06/28/17 1516  TempSrc:   PainSc: 5                  Sherryll Skoczylas A.

## 2017-06-28 NOTE — H&P (Signed)
Michaela Holland is an 36 y.o. female.   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  With the failure of conservative treatment including activity modification and a steroid injection, A MRI was obtained of the left knee showing a lateral parameniscal cyst indicative of a lateral meniscal tear.  Surgery at this point has been recommended.  Past Medical History:  Diagnosis Date  . Anxiety   . Depression   . Diabetes mellitus without complication (HCC)    denies this diagnosis   . Family history of adverse reaction to anesthesia    per patient ; father was having abdominal surgery and had to be placed on medicine to keep his blood pressure up; he never had any issues with low BP before the surgery "   . GERD (gastroesophageal reflux disease)   . Gout    believes only occurred once in feet;   . Hypertension    denies this diagnosis   . Hypothyroidism   . Obesity   . Palpitations   . PCOS (polycystic ovarian syndrome)   . PONV (postoperative nausea and vomiting)    with c section; no issues with followwing procedures   . Seasonal allergies   . Seizures (HCC)    has had "pre-seizure" activity in the brain but deneis any actual seizures   . Sleep apnea    does not currently use due to insurance     Past Surgical History:  Procedure Laterality Date  . CESAREAN SECTION    . CHOLECYSTECTOMY    . ESOPHAGOGASTRODUODENOSCOPY  2001 and 2018  . GANGLION CYST EXCISION Left 2014   foot    History reviewed. No pertinent family history. Social History:  reports that she has never smoked. She has never used smokeless tobacco. She reports that she drinks alcohol. She reports that she does not use drugs.  Allergies:  Allergies  Allergen Reactions  . Sulfa Antibiotics Hives and Shortness Of Breath  . Other     Nuts severe reaction difficulty breathing , throat swelling; mostly tree  nuts but not all tree nuts     Medications Prior to Admission  Medication Sig Dispense Refill  . clonazePAM (KLONOPIN) 0.5 MG tablet Take 0.5-1 tablets (0.25-0.5 mg total) by mouth 2 (two) times daily as needed for anxiety (use sparingly to prevent tolerance/dependence). (Patient taking differently: Take 0.5 mg by mouth 2 (two) times daily as needed for anxiety (use sparingly to prevent tolerance/dependence). ) 30 tablet 0  . diclofenac sodium (VOLTAREN) 1 % GEL Apply 4 g topically 4 (four) times daily. To affected joint. (Patient taking differently: Apply 4 g topically 4 (four) times daily as needed (for joint pain.). ) 100 g 11  . FLUoxetine (PROZAC) 10 MG capsule Take 1 capsule (10 mg total) by mouth daily. 10 mg daily x 1 week, then increase to 20 mg 7 capsule 0  . ibuprofen (ADVIL,MOTRIN) 200 MG tablet Take 800 mg by mouth every 8 (eight) hours as needed (for pain.).    Marland Kitchen levothyroxine (SYNTHROID, LEVOTHROID) 50 MCG tablet Take 50 mcg by mouth daily before breakfast.    . omeprazole (PRILOSEC) 40 MG capsule Take 1 capsule (40 mg total) by mouth daily. (Patient taking differently: Take 40 mg by mouth every evening. ) 30 capsule 3  . ranitidine (ZANTAC) 150 MG tablet Take 150 mg by mouth daily as needed for  heartburn.     . thyroid (ARMOUR THYROID) 90 MG tablet Take 90 mg by mouth daily.    Marland Kitchen. thyroid (ARMOUR) 15 MG tablet Take 15 mg by mouth daily.    . colchicine 0.6 MG tablet Take 2 tabs (1.2mg ) followed by 1 tab (0.6mg ) 1 hour later. Then 1 tab (0.6mg ) daily until pain resolves (Patient taking differently: Take 0.6-1.2 mg by mouth 2 (two) times daily as needed (for gout flare up). ) 8 tablet 0  . FLUoxetine (PROZAC) 20 MG capsule Take 1 capsule (20 mg total) by mouth daily. 90 capsule 1  . metFORMIN (GLUCOPHAGE-XR) 500 MG 24 hr tablet Take 1,500 mg by mouth at bedtime.      No results found for this or any previous visit (from the past 48 hour(s)). No results found.  Review of Systems   Musculoskeletal: Positive for joint pain.  All other systems reviewed and are negative.   Blood pressure 137/87, pulse 79, resp. rate 18, height 5\' 4"  (1.626 m), weight 299 lb (135.6 kg), last menstrual period 06/12/2017, SpO2 100 %. Physical Exam  Constitutional: She is oriented to person, place, and time. She appears well-developed and well-nourished.  HENT:  Head: Normocephalic and atraumatic.  Eyes: Pupils are equal, round, and reactive to light. EOM are normal.  Neck: Normal range of motion. Neck supple.  Cardiovascular: Normal rate and regular rhythm.   Respiratory: Effort normal and breath sounds normal.  GI: Soft. Bowel sounds are normal.  Musculoskeletal:       Left knee: She exhibits decreased range of motion, swelling, effusion and abnormal alignment. Tenderness found. Lateral joint line tenderness noted.  Neurological: She is alert and oriented to person, place, and time.  Skin: Skin is warm and dry.  Psychiatric: She has a normal mood and affect.     Assessment/Plan Left knee with valgus malalignment and a lateral meniscal tear with a parameniscal cyst  1)  To the OR for a left knee arthroscopy with a partial lateral meniscectomy.  Risks and benefits of surgery have been discussed in detail and informed consent is obtained.  Kathryne Hitchhristopher Y Blackman, MD 06/28/2017, 1:04 PM

## 2017-06-28 NOTE — Anesthesia Procedure Notes (Signed)
Procedure Name: LMA Insertion Date/Time: 06/28/2017 1:32 PM Performed by: Ciaira Natividad Pre-anesthesia Checklist: Patient identified, Emergency Drugs available, Suction available, Patient being monitored and Timeout performed Patient Re-evaluated:Patient Re-evaluated prior to induction Oxygen Delivery Method: Circle system utilized Preoxygenation: Pre-oxygenation with 100% oxygen Induction Type: IV induction Ventilation: Mask ventilation without difficulty LMA: LMA inserted LMA Size: 4.0 Tube size: 4.0 mm Number of attempts: 1 Placement Confirmation: positive ETCO2 and breath sounds checked- equal and bilateral Tube secured with: Tape Dental Injury: Teeth and Oropharynx as per pre-operative assessment

## 2017-06-28 NOTE — Anesthesia Preprocedure Evaluation (Signed)
Anesthesia Evaluation  Patient identified by MRN, date of birth, ID band Patient awake    Reviewed: Allergy & Precautions, NPO status , Patient's Chart, lab work & pertinent test results  History of Anesthesia Complications (+) PONV, Family history of anesthesia reaction and history of anesthetic complications  Airway Mallampati: III  TM Distance: >3 FB Neck ROM: Full    Dental no notable dental hx. (+) Teeth Intact   Pulmonary sleep apnea ,    Pulmonary exam normal breath sounds clear to auscultation       Cardiovascular hypertension, Normal cardiovascular exam Rhythm:Regular Rate:Normal     Neuro/Psych Seizures -, Well Controlled,  PSYCHIATRIC DISORDERS Anxiety Depression    GI/Hepatic Neg liver ROS, GERD  Medicated and Controlled,  Endo/Other  diabetes, Well Controlled, Type 2, Oral Hypoglycemic AgentsHypothyroidism Morbid obesity  Renal/GU negative Renal ROS  negative genitourinary   Musculoskeletal Lateral meniscus tear left knee Para meniscal cyst left knee   Abdominal (+) + obese,   Peds  Hematology negative hematology ROS (+)   Anesthesia Other Findings   Reproductive/Obstetrics                             Anesthesia Physical Anesthesia Plan  ASA: III  Anesthesia Plan: General   Post-op Pain Management:    Induction: Intravenous  PONV Risk Score and Plan: 4 or greater and Ondansetron, Dexamethasone, Midazolam, Scopolamine patch - Pre-op and Propofol infusion  Airway Management Planned: LMA  Additional Equipment:   Intra-op Plan:   Post-operative Plan: Extubation in OR  Informed Consent: I have reviewed the patients History and Physical, chart, labs and discussed the procedure including the risks, benefits and alternatives for the proposed anesthesia with the patient or authorized representative who has indicated his/her understanding and acceptance.   Dental advisory  given  Plan Discussed with: Anesthesiologist, CRNA and Surgeon  Anesthesia Plan Comments:         Anesthesia Quick Evaluation

## 2017-06-28 NOTE — Transfer of Care (Signed)
Immediate Anesthesia Transfer of Care Note  Patient: Michaela Holland  Procedure(s) Performed: Procedure(s): LEFT KNEE ARTHROSCOPY with debridement (Left)  Patient Location: PACU  Anesthesia Type:General  Level of Consciousness:  sedated, patient cooperative and responds to stimulation  Airway & Oxygen Therapy:Patient Spontanous Breathing and Patient connected to face mask oxgen  Post-op Assessment:  Report given to PACU RN and Post -op Vital signs reviewed and stable  Post vital signs:  Reviewed and stable  Last Vitals:  Vitals:   06/28/17 1227 06/28/17 1228  BP:  137/87  Pulse: 79   Resp: 18   SpO2: 100%     Complications: No apparent anesthesia complications

## 2017-06-28 NOTE — Brief Op Note (Signed)
06/28/2017  2:11 PM  PATIENT:  Michaela Holland  36 y.o. female  PRE-OPERATIVE DIAGNOSIS:  left knee parameniscal cyst and lateral meniscal tear  POST-OPERATIVE DIAGNOSIS:  left knee parameniscal cyst and lateral meniscal tear  PROCEDURE:  Procedure(s): LEFT KNEE ARTHROSCOPY with debridement (Left)  SURGEON:  Surgeon(s) and Role:    Kathryne Hitch* Infant Zink Y, MD - Primary  PHYSICIAN ASSISTANT: Rexene EdisonGil Clark, PA-C  ANESTHESIA:   local and general  EBL:  minimal  COUNTS:  YES  TOURNIQUET:  none  DICTATION: .Other Dictation: Dictation Number [id number]  PLAN OF CARE: Discharge to home after PACU  PATIENT DISPOSITION:  PACU - hemodynamically stable.   Delay start of Pharmacological VTE agent (>24hrs) due to surgical blood loss or risk of bleeding: no

## 2017-06-28 NOTE — Discharge Instructions (Signed)
Increase your activities as comfort allows. Ice and elevation as needed for swelling. You can remove your dressings in 24 hours and get your incisions wet daily in the shower. Place band-aids over your incisions daily. Full weight bearing as tolerated. You can take a 325 mg aspirin daily for one week. Pump your feet occasionally through out the day.    General Anesthesia, Adult, Care After These instructions provide you with information about caring for yourself after your procedure. Your health care provider may also give you more specific instructions. Your treatment has been planned according to current medical practices, but problems sometimes occur. Call your health care provider if you have any problems or questions after your procedure. What can I expect after the procedure? After the procedure, it is common to have:  Vomiting.  A sore throat.  Mental slowness.  It is common to feel:  Nauseous.  Cold or shivery.  Sleepy.  Tired.  Sore or achy, even in parts of your body where you did not have surgery.  Follow these instructions at home: For at least 24 hours after the procedure:  Do not: ? Participate in activities where you could fall or become injured. ? Drive. ? Use heavy machinery. ? Drink alcohol. ? Take sleeping pills or medicines that cause drowsiness. ? Make important decisions or sign legal documents. ? Take care of children on your own.  Rest. Eating and drinking  If you vomit, drink water, juice, or soup when you can drink without vomiting.  Drink enough fluid to keep your urine clear or pale yellow.  Make sure you have little or no nausea before eating solid foods.  Follow the diet recommended by your health care provider. General instructions  Have a responsible adult stay with you until you are awake and alert.  Return to your normal activities as told by your health care provider. Ask your health care provider what activities are safe  for you.  Take over-the-counter and prescription medicines only as told by your health care provider.  If you smoke, do not smoke without supervision.  Keep all follow-up visits as told by your health care provider. This is important. Contact a health care provider if:  You continue to have nausea or vomiting at home, and medicines are not helpful.  You cannot drink fluids or start eating again.  You cannot urinate after 8-12 hours.  You develop a skin rash.  You have fever.  You have increasing redness at the site of your procedure. Get help right away if:  You have difficulty breathing.  You have chest pain.  You have unexpected bleeding.  You feel that you are having a life-threatening or urgent problem. This information is not intended to replace advice given to you by your health care provider. Make sure you discuss any questions you have with your health care provider. Document Released: 12/03/2000 Document Revised: 01/30/2016 Document Reviewed: 08/11/2015 Elsevier Interactive Patient Education  2018 [employer].   Scopolamine skin patches What is this medicine? SCOPOLAMINE (skoe POL a meen) is used to prevent nausea and vomiting caused by motion sickness, anesthesia and surgery. This medicine may be used for other purposes; ask your health care provider or pharmacist if you have questions. COMMON BRAND NAME(S): Transderm Scop What should I tell my health care provider before I take this medicine? They need to know if you have any of these conditions: -glaucoma -kidney or liver disease -an unusual or allergic reaction (especially skin allergy) to scopolamine,  atropine, other medicines, foods, dyes, or preservatives -pregnant or trying to get pregnant -breast-feeding How should I use this medicine? This medicine is for external use only. Follow the directions on the prescription label. One patch contains enough medicine to prevent motion sickness for up to 3  days. Apply the patch at least 4 hours before you need it and only wear one disc at a time. Choose an area behind the ear, that is clean, dry, hairless and free from any cuts or irritation. Wipe the area with a clean dry tissue. Peel off the plastic backing of the skin patch, trying not to touch the adhesive side with your hands. Do not cut the patches. Firmly apply to the area you have chosen, with the metallic side of the patch to the skin and the tan-colored side showing. Once firmly in place, wash your hands well with soap and water. Remove the disc after 3 days, or sooner if you no longer need it. After removing the patch, wash your hands and the area behind your ear thoroughly with soap and water. The patch will still contain some medicine after use. To avoid accidental contact or ingestion by children or pets, fold the used patch in half with the sticky side together and throw away in the trash out of the reach of children and pets. If you need to use a second patch after you remove the first, place it behind the other ear. Talk to your pediatrician regarding the use of this medicine in children. Special care may be needed. Overdosage: If you think you have taken too much of this medicine contact a poison control center or emergency room at once. NOTE: This medicine is only for you. Do not share this medicine with others. What if I miss a dose? Make sure you apply the patch at least 4 hours before you need it. You can apply it the night before traveling. What may interact with this medicine? -benztropine -bethanechol -medicines for anxiety or sleeping problems like diazepam or temazepam -medicines for hay fever and other allergies -medicines for mental depression -muscle relaxants This list may not describe all possible interactions. Give your health care provider a list of all the medicines, herbs, non-prescription drugs, or dietary supplements you use. Also tell them if you smoke, drink  alcohol, or use illegal drugs. Some items may interact with your medicine. What should I watch for while using this medicine? Keep the patch dry, if possible, to prevent it from falling off. Limited contact with water, however, as in bathing or swimming, will not affect the system. If the patch falls off, throw it away and put a new one behind the other ear. You may get drowsy or dizzy. Do not drive, use machinery, or do anything that needs mental alertness until you know how this medicine affects you. Do not stand or sit up quickly, especially if you are an older patient. This reduces the risk of dizzy or fainting spells. Alcohol may interfere with the effect of this medicine. Avoid alcoholic drinks. Your mouth may get dry. Chewing sugarless gum or sucking hard candy, and drinking plenty of water may help. Contact your doctor if the problem does not go away or is severe. This medicine may cause dry eyes and blurred vision. If you wear contact lenses you may feel some discomfort. Lubricating drops may help. See your eye doctor if the problem does not go away or is severe. If you are going to have a magnetic resonance imaging (  MRI) procedure, tell your MRI technician if you have this patch on your body. It must be removed before a MRI. What side effects may I notice from receiving this medicine? Side effects that you should report to your doctor or health care professional as soon as possible: -agitation, nervousness, confusion -blurred vision and other eye problems -dizziness, drowsiness -eye pain or redness in the whites of the eye -hallucinations -pain or difficulty passing urine -skin rash, itching -vomiting Side effects that usually do not require medical attention (report to your doctor or health care professional if they continue or are bothersome): -headache -nausea This list may not describe all possible side effects. Call your doctor for medical advice about side effects. You may report  side effects to FDA at 1-800-FDA-1088. Where should I keep my medicine? Keep out of the reach of children. Store at room temperature between 20 and 25 degrees C (68 and 77 degrees F). Throw away any unused medicine after the expiration date. When you remove a patch, fold it and throw it in the trash as described above. NOTE: This sheet is a summary. It may not cover all possible information. If you have questions about this medicine, talk to your doctor, pharmacist, or health care provider.  2018 Elsevier/Gold Standard (2012-01-24 13:31:48)

## 2017-06-29 ENCOUNTER — Encounter (HOSPITAL_COMMUNITY): Payer: Self-pay | Admitting: Orthopaedic Surgery

## 2017-06-29 NOTE — Op Note (Signed)
NAMVena Rua:  Snedeker, Norell                  ACCOUNT NO.:  [id number]  MEDICAL RECORD NO.:  [id number]  LOCATION:  PERIO                        [facility]  PHYSICIAN:  Vanita PandaChristopher Y. Magnus IvanBlackman, M.D.DATE OF BIRTH:  07-26-1981  DATE OF PROCEDURE:  06/28/2017 DATE OF DISCHARGE:                              OPERATIVE REPORT   PREOPERATIVE DIAGNOSIS:  Left knee with lateral parameniscal cyst concerning for lateral meniscal tear with synovitis and chondromalacia.  POSTOPERATIVE DIAGNOSES:  Synovitis and chondromalacia only, left knee with no meniscal tear.  PROCEDURE:  Left knee arthroscopy with debridement and partial synovectomy and chondroplasty of the medial femoral condyle.  SURGEON:  Vanita PandaChristopher Y. Magnus IvanBlackman, M.D.  ASSISTANT:  Richardean CanalGilbert Clark, PA-C.  ANESTHESIA: 1. General. 2. Local with a mixture of morphine and Marcaine.  BLOOD LOSS:  Minimal.  COMPLICATIONS:  None.  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  She has tried and failed all forms of conservative treatment.  She is a moderately obese individual, and after failure of conservative treatment, did wish to proceed with an arthroscopic intervention once we had obtained an MRI showing a lateral parameniscal cyst concerning for lateral meniscal tear and some chondromalacia in the knee.  Given the failure of conservative treatment and considerable pain she was having in her knee, an arthroscopic intervention was warranted at this point.  The risks and benefits of surgery were explained to her in detail and she did wish to proceed.  PROCEDURE DESCRIPTION:  After informed consent was obtained, appropriate left knee was marked.  She was brought to the operating room, placed supine on the operating table.  General anesthesia was then obtained. Her left thigh, knee, and leg and ankle were prepped and draped with DuraPrep and sterile drapes and a sterile  stockinette.  We then had the bed raised and a lateral leg post utilized.  A time-out was called, and she was identified as correct patient and correct left knee.  I then made an anterolateral arthroscopy portal, inserted a cannula in the knee, and did not find any significant effusion at all.  I went to the medial compartment, and I made an anteromedial incision.  Right away, we found area of grade 2 chondromalacia on the medial femoral condyle, but not on the large weightbearing surface.  I was able to easily reduce back to a stable margin.  There was no exposed bone.  The medial meniscus was assessed and found to be intact.  The ACL and PCL were also intact and pristine.  There was definitely inflamed Hoffa fat pad, and we debrided this back as well.  With the knee in a flexed position and extended position, the ACL was pristine and intact.  Finally with the knee in a figure-of-4 position, we assessed the lateral compartment and did not find a lateral meniscal tear.  We found normal cartilage on the lateral compartment and a normal popliteus tendon.  Finally with the knee in extended position, we did find a medial plica which we debrided and some synovitis in this area, but overall the cartilage looked really good in her knee especially at  the patellofemoral joint as well.  We then allowed fluid to lavage the knee and then drained all fluid from the knee.  We closed the portal sites with interrupted nylon suture and inserted a mixture of morphine and Marcaine into her knee joint.  She was awakened, extubated, and taken to the recovery room in stable condition.  All final counts were correct, and there were no complications noted.  Postoperatively, we allowed her to weight bear as tolerated and discharged her from the PACU.  We will see her back in the office in a week.     Vanita Panda. Magnus Ivan, M.D.     CYB/MEDQ  D:  06/28/2017  T:  06/29/2017  Job:  [id number]

## 2017-07-02 ENCOUNTER — Ambulatory Visit (HOSPITAL_COMMUNITY): Payer: Self-pay | Admitting: Licensed Clinical Social Worker

## 2017-07-02 ENCOUNTER — Telehealth (INDEPENDENT_AMBULATORY_CARE_PROVIDER_SITE_OTHER): Payer: Self-pay | Admitting: Orthopaedic Surgery

## 2017-07-02 NOTE — Telephone Encounter (Signed)
06/28/2017 OP Note faxed to [employer] [phone]

## 2017-07-04 ENCOUNTER — Telehealth: Payer: Self-pay | Admitting: Neurology

## 2017-07-04 NOTE — Telephone Encounter (Signed)
We have attempted to call the patient 2 times to schedule sleep study. Patient has been unavailable at the phone numbers we have on file and has not returned our calls. At this point we will send a letter asking pt to please contact the sleep lab to schedule their sleep study. If patient calls back we will schedule them for their sleep study. ° °

## 2017-07-08 ENCOUNTER — Ambulatory Visit (INDEPENDENT_AMBULATORY_CARE_PROVIDER_SITE_OTHER): Payer: 59 | Admitting: Orthopaedic Surgery

## 2017-07-08 DIAGNOSIS — Z9889 Other specified postprocedural states: Secondary | ICD-10-CM

## 2017-07-08 NOTE — Progress Notes (Signed)
[name] is just several weeks status post a left knee arthroscopy.  We found some chondromalacia of the medial femoral condyle and were able to decompress a lateral meniscal cyst but fortunately not find any meniscal tears of her knee.  Her ACL and PCL were pristine and the cartilage throughout her knee looked great except for a little area on the medial femoral condyle.  She also had a little bit of effusion in the knee.  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  She does work 12-hour shifts.  We have kept her out of work since the surgery and will likely keep her out of work for at least 1-2 more weeks as she recovers.  On exam her incisions look great to remove the sutures.  There is no significant knee effusion she had excellent range of motion of his knee in a different type of pain that she had preoperative is what she reports.  She says she is working on weight loss and quad strengthening exercises.  I went over arthroscopy pictures.  I do feel that she is a candidate for a ironic acid in the future but certainly trying medications such as glucosamine or generic be helpful.  At this point I will give her a note to return to work starting Friday, November 9 but 1/2 shifts only the first 3 weeks.  She can return to full shift starting December 1st.  We will see her back in 4 weeks to see how she is doing overall.

## 2017-07-10 ENCOUNTER — Encounter (INDEPENDENT_AMBULATORY_CARE_PROVIDER_SITE_OTHER): Payer: Self-pay | Admitting: Orthopaedic Surgery

## 2017-07-11 ENCOUNTER — Encounter: Payer: Self-pay | Admitting: Endocrinology

## 2017-07-11 ENCOUNTER — Ambulatory Visit (INDEPENDENT_AMBULATORY_CARE_PROVIDER_SITE_OTHER): Payer: 59 | Admitting: Endocrinology

## 2017-07-11 VITALS — BP 122/78 | HR 79 | Wt 302.6 lb

## 2017-07-11 DIAGNOSIS — E282 Polycystic ovarian syndrome: Secondary | ICD-10-CM | POA: Diagnosis not present

## 2017-07-11 DIAGNOSIS — E039 Hypothyroidism, unspecified: Secondary | ICD-10-CM

## 2017-07-11 LAB — BASIC METABOLIC PANEL
BUN: 12 mg/dL (ref 6–23)
CHLORIDE: 102 meq/L (ref 96–112)
CO2: 28 meq/L (ref 19–32)
CREATININE: 0.53 mg/dL (ref 0.40–1.20)
Calcium: 9.2 mg/dL (ref 8.4–10.5)
GFR: 138.63 mL/min (ref >60.00)
Glucose, Bld: 92 mg/dL (ref 70–99)
Potassium: 4.1 mEq/L (ref 3.5–5.1)
Sodium: 137 mEq/L (ref 135–145)

## 2017-07-11 LAB — T3, FREE: T3, Free: 3.7 pg/mL (ref 2.3–4.2)

## 2017-07-11 LAB — HEMOGLOBIN A1C: Hgb A1c MFr Bld: 5.1 % (ref 4.6–6.5)

## 2017-07-11 LAB — T4, FREE: FREE T4: 0.81 ng/dL (ref 0.60–1.60)

## 2017-07-11 LAB — TSH: TSH: 1.94 u[IU]/mL (ref 0.35–4.50)

## 2017-07-11 MED ORDER — METFORMIN HCL ER 500 MG PO TB24: 500.0000 mg | ORAL_TABLET | Freq: Every day | ORAL | 3 refills | Status: DC

## 2017-07-11 MED ORDER — LEVOTHYROXINE SODIUM 175 MCG PO TABS: 175.0000 ug | ORAL_TABLET | Freq: Every day | ORAL | 3 refills | Status: DC

## 2017-07-11 NOTE — Progress Notes (Signed)
Subjective:    Patient ID: Michaela Holland, female    DOB: 19-Feb-1981, 36 y.o.   MRN: [id number]  HPI Pt is referred by Dr Lyn HollingsheadAlexander, for polycystic ovary syndrome.  Pt had menarche at age 36.  She had irregular menses as teenager. She is G1P1 (2016--she did not require infertility rx).  She reports as a teenager, she developed moderate hair on the face, and assoc fatigue.  She wants another pregnancy.  She intermittently took oral contraceptives from age [age].  Hypothyroidism was dx'ed as a teenager.  She has taken metformin since age 36, but has not recently taken (she did not tolerate more than 500 mg qd).  She also took aldactone x a few years, as a teenager.  She takes armour thyroid and levothyroxine, but often misses.   Past Medical History:  Diagnosis Date  . Anxiety   . Depression   . Diabetes mellitus without complication (HCC)    denies this diagnosis   . Family history of adverse reaction to anesthesia    per patient ; father was having abdominal surgery and had to be placed on medicine to keep his blood pressure up; he never had any issues with low BP before the surgery "   . GERD (gastroesophageal reflux disease)   . Gout    believes only occurred once in feet;   . Hypertension    denies this diagnosis   . Hypothyroidism   . Obesity   . Palpitations   . PCOS (polycystic ovarian syndrome)   . PONV (postoperative nausea and vomiting)    with c section; no issues with followwing procedures   . Seasonal allergies   . Seizures (HCC)    has had "pre-seizure" activity in the brain but deneis any actual seizures   . Sleep apnea    does not currently use due to insurance     Past Surgical History:  Procedure Laterality Date  . CESAREAN SECTION    . CHOLECYSTECTOMY    . ESOPHAGOGASTRODUODENOSCOPY  2001 and 2018  . GANGLION CYST EXCISION Left 2014   foot  . KNEE ARTHROSCOPY Left 06/28/2017   Procedure: LEFT KNEE ARTHROSCOPY with debridement;  Surgeon: Kathryne HitchBlackman, Christopher  Y, MD;  Location: [facility];  Service: Orthopedics;  Laterality: Left;    Social History   Social History  . Marital status: Married    Spouse name: N/A  . Number of children: N/A  . Years of education: N/A   Occupational History  . Not on file.   Social History Main Topics  . Smoking status: Never Smoker  . Smokeless tobacco: Never Used  . Alcohol use Yes     Comment: occasioanlly   . Drug use: No  . Sexual activity: Yes    Birth control/ protection: None   Other Topics Concern  . Not on file   Social History Narrative  . No narrative on file    Current Outpatient Prescriptions on File Prior to Visit  Medication Sig Dispense Refill  . clonazePAM (KLONOPIN) 0.5 MG tablet Take 0.5-1 tablets (0.25-0.5 mg total) by mouth 2 (two) times daily as needed for anxiety (use sparingly to prevent tolerance/dependence). (Patient taking differently: Take 0.5 mg by mouth 2 (two) times daily as needed for anxiety (use sparingly to prevent tolerance/dependence). ) 30 tablet 0  . colchicine 0.6 MG tablet Take 2 tabs (1.2mg ) followed by 1 tab (0.6mg ) 1 hour later. Then 1 tab (0.6mg ) daily until pain resolves (Patient taking differently: Take 0.6-1.2  mg by mouth 2 (two) times daily as needed (for gout flare up). ) 8 tablet 0  . diclofenac sodium (VOLTAREN) 1 % GEL Apply 4 g topically 4 (four) times daily. To affected joint. (Patient taking differently: Apply 4 g topically 4 (four) times daily as needed (for joint pain.). ) 100 g 11  . FLUoxetine (PROZAC) 10 MG capsule Take 1 capsule (10 mg total) by mouth daily. 10 mg daily x 1 week, then increase to 20 mg 7 capsule 0  . FLUoxetine (PROZAC) 20 MG capsule Take 1 capsule (20 mg total) by mouth daily. 90 capsule 1  . ibuprofen (ADVIL,MOTRIN) 200 MG tablet Take 800 mg by mouth every 8 (eight) hours as needed (for pain.).    Marland Kitchen omeprazole (PRILOSEC) 40 MG capsule Take 1 capsule (40 mg total) by mouth daily. (Patient taking differently: Take 40 mg by mouth  every evening. ) 30 capsule 3  . oxyCODONE-acetaminophen (ROXICET) 5-325 MG tablet Take 1-2 tablets by mouth every 4 (four) hours as needed. 60 tablet 0  . ranitidine (ZANTAC) 150 MG tablet Take 150 mg by mouth daily as needed for heartburn.      No current facility-administered medications on file prior to visit.     Allergies  Allergen Reactions  . Sulfa Antibiotics Hives and Shortness Of Breath  . Other     Nuts severe reaction difficulty breathing , throat swelling; mostly tree nuts but not all tree nuts     Family History  Problem Relation Age of Onset  . Polycystic ovary syndrome Mother   . Hypothyroidism Mother   . Polycystic ovary syndrome Sister   . Hypothyroidism Sister     BP 122/78   Pulse 79   Wt (!) 302 lb 9.6 oz (137.3 kg)   LMP 06/12/2017 (Exact Date)   SpO2 98%   BMI 51.94 kg/m    Review of Systems Denies galactorrhea, sob, excessive sweating, hair loss on the head, diplopia, edema, cold intolerance, numbness, voice change, fever, insomnia, weight gain, easy bruising, pelvic cramps, and dry skin. She has darkening of skin on the back.  She has decreased libido, chronic anxiety, and depression.       Objective:   Physical Exam VS: see vs page GEN: no distress HEAD: head: no deformity.  Moderate terminal hair on the face. eyes: no periorbital swelling, no proptosis external nose and ears are normal mouth: no lesion seen NECK: supple, thyroid is not enlarged CHEST WALL: no deformity LUNGS: clear to auscultation CV: reg rate and rhythm, no murmur ABD: abdomen is soft, nontender.  no hepatosplenomegaly.  not distended.  no hernia.  No striae.   MUSCULOSKELETAL: muscle bulk and strength are grossly normal.  no obvious joint swelling.  gait is normal and steady EXTEMITIES: no deformity.  no ulcer on the feet.  feet are of normal color and temp.  no edema PULSES: dorsalis pedis intact bilat.  no carotid bruit NEURO:  cn 2-12 grossly intact.   readily moves  all 4's.  sensation is intact to touch on the feet SKIN:  Normal texture and temperature.  No rash or suspicious lesion is visible.  Normal skin tone.  No acne NODES:  None palpable at the neck PSYCH: alert, well-oriented.  Does not appear anxious nor depressed.  I have reviewed outside records, and summarized: Pt was noted to have PCO, and referred here.  She was noted to be noncompliant with medication.    Lab Results  Component Value Date  TSH 1.94 07/11/2017   Lab Results  Component Value Date   TESTOSTERONE 17 07/11/2017      Assessment & Plan:  PCO, new to me Hypothyroidism: levothyroxine is favored, especially in a pregnancy Obesity: not thyroid-related.   Patient Instructions  blood tests are requested for you today.  We'll let you know about the results. Please resume the metformin, as it helps PCO, and reduces your risk of diabetes.  I have sent a prescription to your pharmacy.   I have also sent a prescription to your pharmacy, to change both thyroid medications to levothyroxine.   Please see a weight loss specialist.  you will receive a phone call, about a day and time for an appointment Please come back for a follow-up appointment in 6 months.

## 2017-07-11 NOTE — Patient Instructions (Addendum)
blood tests are requested for you today.  We'll let you know about the results. Please resume the metformin, as it helps PCO, and reduces your risk of diabetes.  I have sent a prescription to your pharmacy.   I have also sent a prescription to your pharmacy, to change both thyroid medications to levothyroxine.   Please see a weight loss specialist.  you will receive a phone call, about a day and time for an appointment Please come back for a follow-up appointment in 6 months.

## 2017-07-12 ENCOUNTER — Telehealth (INDEPENDENT_AMBULATORY_CARE_PROVIDER_SITE_OTHER): Payer: Self-pay | Admitting: Orthopaedic Surgery

## 2017-07-12 ENCOUNTER — Encounter (INDEPENDENT_AMBULATORY_CARE_PROVIDER_SITE_OTHER): Payer: Self-pay | Admitting: Orthopaedic Surgery

## 2017-07-12 LAB — TESTOSTERONE,FREE AND TOTAL
TESTOSTERONE FREE: 1.2 pg/mL (ref 0.0–4.2)
Testosterone: 17 ng/dL (ref 8–48)

## 2017-07-12 LAB — PROLACTIN: Prolactin: 5.2 ng/mL

## 2017-07-12 NOTE — Telephone Encounter (Signed)
07/08/2017 OV NOTE FAXED TO [employer] [phone]

## 2017-07-17 ENCOUNTER — Encounter (INDEPENDENT_AMBULATORY_CARE_PROVIDER_SITE_OTHER): Payer: Self-pay | Admitting: Physician Assistant

## 2017-07-17 ENCOUNTER — Ambulatory Visit (INDEPENDENT_AMBULATORY_CARE_PROVIDER_SITE_OTHER): Payer: 59 | Admitting: Physician Assistant

## 2017-07-17 DIAGNOSIS — Z9889 Other specified postprocedural states: Secondary | ICD-10-CM

## 2017-07-18 ENCOUNTER — Encounter (INDEPENDENT_AMBULATORY_CARE_PROVIDER_SITE_OTHER): Payer: Self-pay | Admitting: Physician Assistant

## 2017-07-18 DIAGNOSIS — Z9889 Other specified postprocedural states: Secondary | ICD-10-CM | POA: Insufficient documentation

## 2017-07-18 NOTE — Progress Notes (Signed)
[name] is now 3 weeks s/p left knee arthroscopy. Returns today due to increased pain with activity. States after Trick or Treating with her daughter her knee pain became severe.  Wondering if she can have a injection in the knee today.  Physical exam: Left knee port sites are well-healed.  No signs of infection.  No abnormal warmth.  She has overall good range of motion of the knee.  No effusion.  Impression: 3 weeks status post left knee arthroscopy  Plan: She will continue to work on quad strengthening knee.  Talked to her about possible physical therapy she has been to therapy in the past for knee really feel she has a good concept of what she needs to be doing her home as far as exercises.  Offered her a cortisone injection in the knee she only wants it done after ultrasound.  Therefore she will contact her physician who is injected her knee in the past with ultrasound.  We will see her back in 1 month.

## 2017-07-18 NOTE — Telephone Encounter (Signed)
Patient would like a response this afternoon.  Thank you.

## 2017-07-19 ENCOUNTER — Encounter (INDEPENDENT_AMBULATORY_CARE_PROVIDER_SITE_OTHER): Payer: Self-pay

## 2017-07-22 ENCOUNTER — Ambulatory Visit (INDEPENDENT_AMBULATORY_CARE_PROVIDER_SITE_OTHER): Payer: 59 | Admitting: Family Medicine

## 2017-07-22 VITALS — BP 142/88 | Ht 64.0 in | Wt 309.0 lb

## 2017-07-22 DIAGNOSIS — M25562 Pain in left knee: Secondary | ICD-10-CM

## 2017-07-22 NOTE — Patient Instructions (Addendum)
Thank you for coming in today. Call or go to the ER if you develop a large red swollen joint with extreme pain or oozing puss.  Recheck as needed.  Return to work Friday.  Let me know if the return to work is not going to happen like we planned.

## 2017-07-22 NOTE — Progress Notes (Signed)
Michaela Holland is a 36 y.o. female who presents to [facility] today for left knee pain. [name] has had persistent pain of her left knee since August. She's had 2 injections one of which made any difference. She subsequently had an MRI which showed concern for a meniscus tear with meniscus cysts. She's had subsequent arthroscopic surgery on October 19. Unfortunately her pain is been worsening recently just before she scheduled to return to work. She was seen by the PA at orthopedics on November 7 who offered a steroid injection. She would like to have this done at my office with ultrasound guidance. She notes persistent pain in his quite frustrated with how her knee is doing.   Past Medical History:  Diagnosis Date  . Anxiety   . Depression   . Diabetes mellitus without complication (HCC)    denies this diagnosis   . Family history of adverse reaction to anesthesia    per patient ; father was having abdominal surgery and had to be placed on medicine to keep his blood pressure up; he never had any issues with low BP before the surgery "   . GERD (gastroesophageal reflux disease)   . Gout    believes only occurred once in feet;   . Hypertension    denies this diagnosis   . Hypothyroidism   . Obesity   . Palpitations   . PCOS (polycystic ovarian syndrome)   . PONV (postoperative nausea and vomiting)    with c section; no issues with followwing procedures   . Seasonal allergies   . Seizures (HCC)    has had "pre-seizure" activity in the brain but deneis any actual seizures   . Sleep apnea    does not currently use due to insurance    Past Surgical History:  Procedure Laterality Date  . CESAREAN SECTION    . CHOLECYSTECTOMY    . ESOPHAGOGASTRODUODENOSCOPY  2001 and 2018  . GANGLION CYST EXCISION Left 2014   foot   Social History   Tobacco Use  . Smoking status: Never Smoker  . Smokeless tobacco: Never Used  Substance Use Topics  .  Alcohol use: Yes    Comment: occasioanlly      ROS:  As above   Medications: Current Outpatient Medications  Medication Sig Dispense Refill  . clonazePAM (KLONOPIN) 0.5 MG tablet Take 0.5-1 tablets (0.25-0.5 mg total) by mouth 2 (two) times daily as needed for anxiety (use sparingly to prevent tolerance/dependence). (Patient taking differently: Take 0.5 mg by mouth 2 (two) times daily as needed for anxiety (use sparingly to prevent tolerance/dependence). ) 30 tablet 0  . colchicine 0.6 MG tablet Take 2 tabs (1.2mg ) followed by 1 tab (0.6mg ) 1 hour later. Then 1 tab (0.6mg ) daily until pain resolves (Patient taking differently: Take 0.6-1.2 mg by mouth 2 (two) times daily as needed (for gout flare up). ) 8 tablet 0  . diclofenac sodium (VOLTAREN) 1 % GEL Apply 4 g topically 4 (four) times daily. To affected joint. (Patient taking differently: Apply 4 g topically 4 (four) times daily as needed (for joint pain.). ) 100 g 11  . ibuprofen (ADVIL,MOTRIN) 200 MG tablet Take 800 mg by mouth every 8 (eight) hours as needed (for pain.).    Marland Kitchen. metFORMIN (GLUCOPHAGE-XR) 500 MG 24 hr tablet Take 1 tablet (500 mg total) by mouth at bedtime. 90 tablet 3  . omeprazole (PRILOSEC) 40 MG capsule Take 1 capsule (40 mg total) by mouth  daily. (Patient taking differently: Take 40 mg by mouth every evening. ) 30 capsule 3  . oxyCODONE-acetaminophen (ROXICET) 5-325 MG tablet Take 1-2 tablets by mouth every 4 (four) hours as needed. 60 tablet 0  . ranitidine (ZANTAC) 150 MG tablet Take 150 mg by mouth daily as needed for heartburn.     Marland Kitchen. FLUoxetine (PROZAC) 10 MG capsule Take 1 capsule (10 mg total) by mouth daily. 10 mg daily x 1 week, then increase to 20 mg 7 capsule 0  . FLUoxetine (PROZAC) 20 MG capsule Take 1 capsule (20 mg total) by mouth daily. 90 capsule 1  . levothyroxine (SYNTHROID, LEVOTHROID) 175 MCG tablet Take 1 tablet (175 mcg total) by mouth daily before breakfast. 90 tablet 3   No current  facility-administered medications for this visit.    Allergies  Allergen Reactions  . Sulfa Antibiotics Hives and Shortness Of Breath  . Other     Nuts severe reaction difficulty breathing , throat swelling; mostly tree nuts but not all tree nuts      Exam:  BP (!) 142/88   Ht 5\' 4"  (1.626 m)   Wt (!) 309 lb (140.2 kg)   BMI 53.04 kg/m  General: Well Developed, well nourished, and in no acute distress.  Neuro/Psych: Alert and oriented x3, extra-ocular muscles intact, able to move all 4 extremities, sensation grossly intact. Skin: Warm and dry, no rashes noted.  Respiratory: Not using accessory muscles, speaking in full sentences, trachea midline.  Cardiovascular: Pulses palpable, no extremity edema. Abdomen: Does not appear distended. MSK: Left knee no significant effusion well-appearing portal scars that are nontender. Range of motion 0-120 with no retropatellar crepitations. Tender palpation lateral and medial joint lines. Stable ligamentous exam.  Procedure: Real-time Ultrasound Guided Injection of left knee  Device: GE Logiq E  Images permanently stored and available for review in the ultrasound unit. Verbal informed consent obtained. Discussed risks and benefits of procedure. Warned about infection bleeding damage to structures skin hypopigmentation and fat atrophy among others. Patient expresses understanding and agreement Time-out conducted.  Noted no overlying erythema, induration, or other signs of local infection.  Skin prepped in a sterile fashion.  Local anesthesia: Topical Ethyl chloride.  With sterile technique and under real time ultrasound guidance: 80mg  depomedrol and 4ml marcaine injected easily.  Completed without difficulty  Pain immediately resolved suggesting accurate placement of the medication.  Advised to call if fevers/chills, erythema, induration, drainage, or persistent bleeding.  Images permanently stored and available for review in the  ultrasound unit.  Impression: Technically successful ultrasound guided injection.      No results found for this or any previous visit (from the past 48 hour(s)). No results found.    Assessment and Plan: 36 y.o. female with left knee pain unclear etiology likely chondromalacia. Status post injection today. I'm hopeful this will provide good pain relief. I have adjusted her return to work date until Friday. Recheck as needed.    No orders of the defined types were placed in this encounter.  No orders of the defined types were placed in this encounter.   Discussed warning signs or symptoms. Please see discharge instructions. Patient expresses understanding.

## 2017-07-23 ENCOUNTER — Ambulatory Visit (HOSPITAL_COMMUNITY): Payer: 59 | Admitting: Licensed Clinical Social Worker

## 2017-07-23 ENCOUNTER — Encounter (INDEPENDENT_AMBULATORY_CARE_PROVIDER_SITE_OTHER): Payer: Self-pay

## 2017-07-23 ENCOUNTER — Encounter (HOSPITAL_COMMUNITY): Payer: Self-pay | Admitting: Licensed Clinical Social Worker

## 2017-07-23 DIAGNOSIS — F411 Generalized anxiety disorder: Secondary | ICD-10-CM

## 2017-07-23 DIAGNOSIS — F331 Major depressive disorder, recurrent, moderate: Secondary | ICD-10-CM

## 2017-07-23 MED FILL — OMEPRAZOLE DR 40 MG CAPSULE: 40 | 30 days supply | Qty: 30 | Fill #0

## 2017-07-23 NOTE — Progress Notes (Signed)
Comprehensive Clinical Assessment (CCA) Note  07/23/2017 Michaela Holland [id number]  Visit Diagnosis:      ICD-10-CM   1. Moderate episode of recurrent major depressive disorder (HCC) F33.1   2. Generalized anxiety disorder F41.1       CCA Part One  Part One has been completed on paper by the patient.  (See scanned document in Chart Review)  CCA Part Two A  Intake/Chief Complaint:  CCA Intake With Chief Complaint CCA Part Two Date: 07/23/17 CCA Part Two Time: 1514 Chief Complaint/Presenting Problem: Pt is referred by Dr. Rene Kocher for depression. Pt has weight issues, issues with medication compliance.  Pt jsut moved from [place] in April. Has issues with self worth.  Stress  Collateral Involvement: Dr. Gaspar Skeeters Individual's Strengths: desire to feel better, motivated Individual's Preferences: prefers to feel better Individual's Abilities: ability to work a program of recovery Type of Services Patient Feels Are Needed: outpatient services  Mental Health Symptoms Depression:  Depression: Change in energy/activity, Difficulty Concentrating, Fatigue, Increase/decrease in appetite, Irritability, Weight gain/loss  Mania:     Anxiety:   Anxiety: Difficulty concentrating, Fatigue, Irritability, Restlessness, Sleep, Tension, Worrying  Psychosis:     Trauma:     Obsessions:     Compulsions:  Compulsions: Intended to reduce stress or prevent another outcome(eating)  Inattention:     Hyperactivity/Impulsivity:     Oppositional/Defiant Behaviors:     Borderline Personality:  Emotional Irregularity: Unstable self-image  Other Mood/Personality Symptoms:      Mental Status Exam Appearance and self-care  Stature:  Stature: Average  Weight:  Weight: Average weight  Clothing:  Clothing: Casual  Grooming:  Grooming: Normal  Cosmetic use:  Cosmetic Use: None  Posture/gait:  Posture/Gait: Normal  Motor activity:  Motor Activity: Not Remarkable  Sensorium  Attention:  Attention: Distractible,  Inattentive, Persistent  Concentration:  Concentration: Anxiety interferes, Scattered  Orientation:  Orientation: X5  Recall/memory:  Recall/Memory: Defective in short-term  Affect and Mood  Affect:  Affect: Depressed  Mood:  Mood: Anxious(hopeful)  Relating  Eye contact:  Eye Contact: Normal  Facial expression:  Facial Expression: Anxious  Attitude toward examiner:  Attitude Toward Examiner: Cooperative  Thought and Language  Speech flow: Speech Flow: Normal  Thought content:  Thought Content: Appropriate to mood and circumstances  Preoccupation:     Hallucinations:     Organization:     Company secretary of Knowledge:  Fund of Knowledge: Average  Intelligence:  Intelligence: Above Average  Abstraction:  Abstraction: Normal  Judgement:  Judgement: Normal  Reality Testing:  Reality Testing: Realistic  Insight:  Insight: Good  Decision Making:  Decision Making: Normal  Social Functioning  Social Maturity:  Social Maturity: Responsible  Social Judgement:  Social Judgement: Normal  Stress  Stressors:  Stressors: Work, Transitions, Arts administrator, Illness, Grief/losses, Family conflict  Coping Ability:  Coping Ability: Deficient supports, Designer, jewellery, Building surveyor Deficits:     Supports:      Family and Psychosocial History: Family history Marital status: Married Number of Years Married: 4 What types of issues is patient dealing with in the relationship?: due to stress fighting more than usual, disagreements, frustrated with each other Does patient have children?: Yes How many children?: 1 How is patient's relationship with their children?: 25 years old  Childhood History:  Childhood History By whom was/is the patient raised?: Both parents Additional childhood history information: healthy happy home, financial stress weight issues when i was a child Description of patient's relationship with caregiver  when they were a child: good relationship with parents as a  child Patient's description of current relationship with people who raised him/her: really good relationship, live in [place] How were you disciplined when you got in trouble as a child/adolescent?: spanked, privileges taken away Does patient have siblings?: Yes Number of Siblings: 2 Description of patient's current relationship with siblings: strained with brother (he's withdrawn and intraverted) [place]. Strained relationship lately with sister due to contentious relationship with pt's husband Did patient suffer any verbal/emotional/physical/sexual abuse as a child?: No Did patient suffer from severe childhood neglect?: No Has patient ever been sexually abused/assaulted/raped as an adolescent or adult?: No Was the patient ever a victim of a crime or a disaster?: No Witnessed domestic violence?: No Has patient been effected by domestic violence as an adult?: No  CCA Part Two B  Employment/Work Situation: Employment / Work Situation Employment situation: Employed Where is patient currently employed?: [employer] 5th floor How 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 Patient's job has been impacted by current illness: Yes(stress level is worse due to work) 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, teacher  Has patient ever been in the [employer] and Companymilitary?: No Are There Guns or Other Weapons in Your Home?: No  Education: Education Last Grade Completed: 16 Did Garment/textile technologistYou Graduate From [employer]?: Yes Did Theme park managerYou Attend College?: Yes What Type of College Degree Do you Have?: Associates Nursing, BS education Did Designer, television/film setYou Attend Graduate School?: No  Religion: Religion/Spirituality Are You A Religious Person?: Yes What is Your Religious Affiliation?: ChiropodistChristian  Leisure/Recreation: Leisure / Recreation Leisure and Hobbies: netflix, spend time with family, pedicure, massage  Exercise/Diet: Exercise/Diet Do You Exercise?: No Have You Gained or Lost A  Significant Amount of Weight in the Past Six Months?: Yes-Gained Number of Pounds Gained: 25 Do You Follow a Special Diet?: No Do You Have Any Trouble Sleeping?: No  CCA Part Two C  Alcohol/Drug Use: Alcohol / Drug Use History of alcohol / drug use?: No history of alcohol / drug abuse Longest period of sobriety (when/how long): used alcohol as stress coping tool 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 didn't seek help,                      CCA Part Three  ASAM's:  Six Dimensions of Multidimensional Assessment  Dimension 1:  Acute Intoxication and/or Withdrawal Potential:     Dimension 2:  Biomedical Conditions and Complications:     Dimension 3:  Emotional, Behavioral, or Cognitive Conditions and Complications:     Dimension 4:  Readiness to Change:     Dimension 5:  Relapse, Continued use, or Continued Problem Potential:     Dimension 6:  Recovery/Living Environment:      Substance use Disorder (SUD)    Social Function:  Social Functioning Social Maturity: Responsible Social Judgement: Normal  Stress:  Stress Stressors: Work, Transitions, Arts administratorMoney, Illness, Grief/losses, Family conflict Coping Ability: Deficient supports, Designer, jewelleryxhausted, Overwhelmed Patient Takes Medications The Way The Doctor Instructed?: No Priority Risk: Low Acuity  Risk Assessment- Self-Harm Potential: Risk Assessment For Self-Harm Potential Thoughts of Self-Harm: No current thoughts Method: No plan Availability of Means: No access/NA  Risk Assessment -Dangerous to Others Potential: Risk Assessment For Dangerous to Others Potential Method: No Plan Availability of Means: No access or NA Intent: Vague intent or NA  DSM5 Diagnoses: Patient Active Problem List  Diagnosis Date Noted  . S/P arthroscopy of left knee 07/18/2017  . Acute pain of left knee 06/17/2017  . Meniscal cyst, left 06/17/2017  . Cyst of lateral meniscus of left knee 05/08/2017  . Anterior cruciate ligament  sprain 05/08/2017  . Morbid obesity (HCC) 05/08/2017  . History of obstructive sleep apnea 04/09/2017  . Hypothyroidism 04/09/2017  . PCOS (polycystic ovarian syndrome) 04/09/2017  . Anxiety 04/09/2017  . Chronic pain of left knee 04/09/2017  . Gastroesophageal reflux disease with esophagitis 04/09/2017    Patient Centered Plan: Patient is on the following Treatment Plan(s):  Depression and anxiety   Recommendations for Services/Supports/Treatments: Recommendations for Services/Supports/Treatments Recommendations For Services/Supports/Treatments: Individual Therapy, Medication Management  Treatment Plan Summary:    Referrals to Alternative Service(s): Referred to Alternative Service(s):   Place:   Date:   Time:    Referred to Alternative Service(s):   Place:   Date:   Time:    Referred to Alternative Service(s):   Place:   Date:   Time:    Referred to Alternative Service(s):   Place:   Date:   Time:     Vernona RiegerMACKENZIE,Josip Merolla S

## 2017-08-07 ENCOUNTER — Ambulatory Visit (INDEPENDENT_AMBULATORY_CARE_PROVIDER_SITE_OTHER): Payer: 59 | Admitting: Physician Assistant

## 2017-08-07 ENCOUNTER — Encounter (INDEPENDENT_AMBULATORY_CARE_PROVIDER_SITE_OTHER): Payer: Self-pay | Admitting: Orthopaedic Surgery

## 2017-08-07 ENCOUNTER — Encounter: Payer: Self-pay | Admitting: Physical Therapy

## 2017-08-07 DIAGNOSIS — Z9889 Other specified postprocedural states: Secondary | ICD-10-CM

## 2017-08-07 NOTE — Progress Notes (Signed)
[name] returns today follow-up of her left knee status post knee arthroscopy 06/28/2017.  She did undergo a cortisone injection of the left knee under ultrasounds to help for about 2 days.  She is now having stiffness and soreness in the knee.  She is back at work working half shifts and states she is doing okay but knee is definitely throbbing and tight by the end of shift.  Physical exam: Left knee no effusion no abnormal warmth.  Full range of motion of the knee.  No instability.  She ambulates without any assistive device.  Impression: Status post left knee arthroscopy  Plan: This point time we will send her to physical therapy for range of motion strengthening modalities home exercise program.  We will keep her half days at work until December 17 and then return to full duties.  We will see her back in a month from today for reevaluation.  May consider supplemental injection in the future.

## 2017-08-08 ENCOUNTER — Ambulatory Visit (INDEPENDENT_AMBULATORY_CARE_PROVIDER_SITE_OTHER): Payer: 59 | Admitting: Orthopaedic Surgery

## 2017-08-08 ENCOUNTER — Encounter (HOSPITAL_COMMUNITY): Payer: Self-pay | Admitting: Psychiatry

## 2017-08-08 ENCOUNTER — Encounter (HOSPITAL_COMMUNITY): Payer: Self-pay | Admitting: Licensed Clinical Social Worker

## 2017-08-08 ENCOUNTER — Ambulatory Visit (INDEPENDENT_AMBULATORY_CARE_PROVIDER_SITE_OTHER): Payer: 59 | Admitting: Licensed Clinical Social Worker

## 2017-08-08 DIAGNOSIS — F331 Major depressive disorder, recurrent, moderate: Secondary | ICD-10-CM | POA: Diagnosis not present

## 2017-08-08 NOTE — Progress Notes (Signed)
   THERAPIST PROGRESS NOTE  Session Time: 11:10-12pm  Participation Level: Active  Behavioral Response: CasualAlertAnxious  Type of Therapy: Individual Therapy  Treatment Goals addressed: Anxiety  Interventions: CBT  Summary: Garald Baldingnne K Dragoo is a 36 y.o. female who presents for her first individual counseling session. Spent a considerable amount of time building a trusting therapeutic relationship. Pt is a Engineer, civil (consulting)nurse at [employer] and is out on STD due to knee replacement surgery. She is back at work half days (6 hours), 3 days per week. Her husband stays at home with their 2yo daughter and tutors online. Pt is anxiously trying to decide about stressors in her life: return to work full time, whether she can continue to work in the same capacity, her husband's stress level, financial problems, long term work capabilities with knee. Explained purpose process and practice of mindfulness techniques. Suggested to pt she continue the mindfulness practice at home  Suicidal/Homicidal: Nowithout intent/plan  Therapist Response:  Assessed pt's current functioning and reviewed progress. Assisted pt building a trusting therapeutic relationship. Assisted pt processing for the management of stressors.  Plan: Return again in 2 weeks.  Diagnosis: Axis I: Moderate episode of recurrent major depressive disorder    Yamilka Lopiccolo S, LCAS 08/08/2017

## 2017-08-09 ENCOUNTER — Encounter: Payer: Self-pay | Admitting: Rehabilitative and Restorative Service Providers"

## 2017-08-09 ENCOUNTER — Encounter: Payer: Self-pay | Admitting: Gastroenterology

## 2017-08-09 ENCOUNTER — Ambulatory Visit (HOSPITAL_COMMUNITY): Payer: Self-pay | Admitting: Psychiatry

## 2017-08-09 ENCOUNTER — Ambulatory Visit (INDEPENDENT_AMBULATORY_CARE_PROVIDER_SITE_OTHER): Payer: 59 | Admitting: Rehabilitative and Restorative Service Providers"

## 2017-08-09 ENCOUNTER — Encounter (INDEPENDENT_AMBULATORY_CARE_PROVIDER_SITE_OTHER): Payer: Self-pay

## 2017-08-09 ENCOUNTER — Ambulatory Visit (INDEPENDENT_AMBULATORY_CARE_PROVIDER_SITE_OTHER): Payer: 59 | Admitting: Gastroenterology

## 2017-08-09 ENCOUNTER — Ambulatory Visit (HOSPITAL_COMMUNITY): Payer: 59 | Admitting: Psychiatry

## 2017-08-09 VITALS — BP 124/90 | HR 80 | Ht 64.0 in | Wt 304.2 lb

## 2017-08-09 DIAGNOSIS — K219 Gastro-esophageal reflux disease without esophagitis: Secondary | ICD-10-CM

## 2017-08-09 DIAGNOSIS — G8929 Other chronic pain: Secondary | ICD-10-CM

## 2017-08-09 DIAGNOSIS — R1319 Other dysphagia: Secondary | ICD-10-CM

## 2017-08-09 DIAGNOSIS — M6281 Muscle weakness (generalized): Secondary | ICD-10-CM | POA: Diagnosis not present

## 2017-08-09 DIAGNOSIS — R293 Abnormal posture: Secondary | ICD-10-CM

## 2017-08-09 DIAGNOSIS — K449 Diaphragmatic hernia without obstruction or gangrene: Secondary | ICD-10-CM | POA: Diagnosis not present

## 2017-08-09 DIAGNOSIS — R2689 Other abnormalities of gait and mobility: Secondary | ICD-10-CM | POA: Diagnosis not present

## 2017-08-09 DIAGNOSIS — M25562 Pain in left knee: Secondary | ICD-10-CM

## 2017-08-09 DIAGNOSIS — R1310 Dysphagia, unspecified: Secondary | ICD-10-CM | POA: Diagnosis not present

## 2017-08-09 NOTE — Progress Notes (Signed)
[facility] Consult Note:  History: Michaela Holland 08/09/2017  Referring physician: Sunnie Nielsen, DO  Reason for consult/chief complaint: Gastroesophageal Reflux (S/P Egd in [place]. Procedure performed in April) and Hiatal Hernia   Subjective  HPI:  This is a 36 year old woman referred by primary care noted above to establish care for reflux and dysphagia. We have some records from her most recent evaluation in [place] in March 2018. Phylisha reports that she began having frequent heartburn symptoms followed by dysphagia about 6 or 7 years ago.  She underwent an upper endoscopy with a dilation in 2012 and was then put on a regular dose of PPI.  Those records are not available for review. She was apparently doing well for several years, then had frequent heartburn symptoms and some dysphagia during pregnancy about 2 years ago.  She was unable to undergo endoscopy at that time.  January 2018 she underwent upper endoscopy with dilation of an esophageal ring, and biopsies apparently showed eosinophils in the distal but not proximal esophagus.  She was brought back for repeat upper endoscopy March 2018, and I reviewed that clinic note as well.  Upper endoscopy did not show mucosal signs of EoE, and biopsy showed low-grade eosinophils of 20-25 in the distal esophagus, but none in the proximal.  A widely patent esophageal ring was found that did not require dilation.  She has been doing well these days with generally good control of reflux taking her PPI 3 times a week, using Zantac as needed or Tums for breakthrough symptoms.  If she is careful about eating, she has rare dysphagia. [name] expresses frustration over difficulty losing weight, and is aware how that affects her reflux.  ROS:  Review of Systems  Constitutional: Negative for appetite change and unexpected weight change.  HENT: Negative for mouth sores and voice change.   Eyes: Negative for pain and  redness.  Respiratory: Negative for cough and shortness of breath.   Cardiovascular: Negative for chest pain and palpitations.  Genitourinary: Negative for dysuria and hematuria.  Musculoskeletal: Positive for arthralgias. Negative for myalgias.  Skin: Negative for pallor and rash.  Neurological: Negative for weakness and headaches.  Hematological: Negative for adenopathy.     Past Medical History: Past Medical History:  Diagnosis Date  . Anxiety   . Depression   . Diabetes mellitus without complication (HCC)    denies this diagnosis   . Family history of adverse reaction to anesthesia    per patient ; father was having abdominal surgery and had to be placed on medicine to keep his blood pressure up; he never had any issues with low BP before the surgery "   . GERD (gastroesophageal reflux disease)   . Gout    believes only occurred once in feet;   . Hypertension    denies this diagnosis   . Hypothyroidism   . Obesity   . Palpitations   . PCOS (polycystic ovarian syndrome)   . PONV (postoperative nausea and vomiting)    with c section; no issues with followwing procedures   . Seasonal allergies   . Seizures (HCC)    has had "pre-seizure" activity in the brain but deneis any actual seizures   . Sleep apnea    does not currently use due to insurance      Past Surgical History: Past Surgical History:  Procedure Laterality Date  . CESAREAN SECTION    . CHOLECYSTECTOMY    . ESOPHAGOGASTRODUODENOSCOPY  2001  and 2018  . GANGLION CYST EXCISION Left 2014   foot  . KNEE ARTHROSCOPY Left 06/28/2017   Procedure: LEFT KNEE ARTHROSCOPY with debridement;  Surgeon: Kathryne HitchBlackman, Christopher Y, MD;  Location: [facility];  Service: Orthopedics;  Laterality: Left;     Family History: Family History  Problem Relation Age of Onset  . Polycystic ovary syndrome Mother   . Hypothyroidism Mother   . Polycystic ovary syndrome Sister   . Hypothyroidism Sister     Social History: Social  History   Socioeconomic History  . Marital status: Married    Spouse name: None  . Number of children: None  . Years of education: None  . Highest education level: None  Social Needs  . Financial resource strain: None  . Food insecurity - worry: None  . Food insecurity - inability: None  . Transportation needs - medical: None  . Transportation needs - non-medical: None  Occupational History  . None  Tobacco Use  . Smoking status: Never Smoker  . Smokeless tobacco: Never Used  Substance and Sexual Activity  . Alcohol use: Yes    Comment: occasioanlly   . Drug use: No  . Sexual activity: Yes    Birth control/protection: None  Other Topics Concern  . None  Social History Narrative  . None    Allergies: Allergies  Allergen Reactions  . Sulfa Antibiotics Hives and Shortness Of Breath  . Other     Nuts severe reaction difficulty breathing , throat swelling; mostly tree nuts but not all tree nuts    She is a nurse  Outpatient Meds: Current Outpatient Medications  Medication Sig Dispense Refill  . clonazePAM (KLONOPIN) 0.5 MG tablet Take 0.5-1 tablets (0.25-0.5 mg total) by mouth 2 (two) times daily as needed for anxiety (use sparingly to prevent tolerance/dependence). (Patient taking differently: Take 0.5 mg by mouth 2 (two) times daily as needed for anxiety (use sparingly to prevent tolerance/dependence). ) 30 tablet 0  . colchicine 0.6 MG tablet Take 2 tabs (1.2mg ) followed by 1 tab (0.6mg ) 1 hour later. Then 1 tab (0.6mg ) daily until pain resolves (Patient taking differently: Take 0.6-1.2 mg by mouth 2 (two) times daily as needed (for gout flare up). ) 8 tablet 0  . diclofenac sodium (VOLTAREN) 1 % GEL Apply 4 g topically 4 (four) times daily. To affected joint. (Patient taking differently: Apply 4 g topically 4 (four) times daily as needed (for joint pain.). ) 100 g 11  . FLUoxetine (PROZAC) 10 MG capsule Take 1 capsule (10 mg total) by mouth daily. 10 mg daily x 1 week,  then increase to 20 mg 7 capsule 0  . FLUoxetine (PROZAC) 20 MG capsule Take 1 capsule (20 mg total) by mouth daily. 90 capsule 1  . ibuprofen (ADVIL,MOTRIN) 200 MG tablet Take 800 mg by mouth every 8 (eight) hours as needed (for pain.).    Marland Kitchen. metFORMIN (GLUCOPHAGE-XR) 500 MG 24 hr tablet Take 1 tablet (500 mg total) by mouth at bedtime. 90 tablet 3  . omeprazole (PRILOSEC) 40 MG capsule Take 1 capsule (40 mg total) by mouth daily. (Patient taking differently: Take 40 mg by mouth every evening. ) 30 capsule 3  . ranitidine (ZANTAC) 150 MG tablet Take 150 mg by mouth daily as needed for heartburn.      No current facility-administered medications for this visit.       ___________________________________________________________________ Objective   Exam:  BP 124/90   Pulse 80   Ht 5\' 4"  (1.626  m)   Wt (!) 304 lb 4 oz (138 kg)   BMI 52.22 kg/m    General: this is a(n) well-appearing and morbidly obese woman with normal vocal quality  Eyes: sclera anicteric, no redness  ENT: oral mucosa moist without lesions, no cervical or supraclavicular lymphadenopathy, good dentition  CV: RRR without murmur, S1/S2, no JVD, no peripheral edema  Resp: clear to auscultation bilaterally, normal RR and effort noted  GI: soft, no tenderness, with active bowel sounds. No guarding or palpable organomegaly noted.  Skin; warm and dry, no rash or jaundice noted  Neuro: awake, alert and oriented x 3. Normal gross motor function and fluent speech  Labs:  Previous records as noted above  Assessment: Encounter Diagnoses  Name Primary?  . Gastroesophageal reflux disease without esophagitis Yes  . Esophageal dysphagia   . Hiatal hernia   . Morbid obesity (HCC)     She seems to have PPI responsive EOE or just GERD causing these mucosal changes.  It seems as if she has responded well to dilation of the esophageal ring when needed.  We reviewed GERD in the usual medicine as well as diet and  lifestyle changes.  She is doing her best with these things.  She asked my opinion about how best to handle the medications.  I agree entirely with taking the PPI every other day, and then as needed use of something like Zantac or Pepcid Complete. She does not appear to be in need of an endoscopy at this point, but she will contact me if her dysphagia is becoming frequent.  I will plan to see her in a year or sooner as needed.   Thank you for the courtesy of this consult.  Please call me with any questions or concerns.  Charlie PitterHenry L Danis III  CC: Sunnie NielsenAlexander, Natalie, DO

## 2017-08-09 NOTE — Progress Notes (Signed)
Do you want

## 2017-08-09 NOTE — Patient Instructions (Signed)
Gluteal Sets    Tighten buttocks while pressing pelvis to floor. Hold _10___ seconds. Repeat _10___ times per set. Do __2-3__ sets per session. Do __1__ sessions per day.   HIP: Hamstrings - Supine  Place strap around foot. Raise leg up, keeping knee straight.  Bend opposite knee to protect back if indicated. Hold 30 seconds. 3 reps per set, 2-3 sets per day  Outer Hip Stretch: Reclined IT Band Stretch (Strap)   Strap around one foot, pull leg across body until you feel a pull or stretch in the outside of your hip, with shoulders on mat. Hold for 30 seconds. Repeat 3 times each leg. 2-3 times/day.   Calf Stretch    Place hands on wall at shoulder height. Keeping back leg straight, bend front leg, feet pointing forward, heels flat on floor. Lean forward slightly until stretch is felt in calf of back leg. Hold stretch ___ seconds, breathing slowly in and out. Repeat stretch with other leg back. Do ___ sessions per day. Variation: Use chair or table for support.  Achilles / Soleus, Standing    Stand, right foot behind, heel on floor and turned slightly out. Lower hips and bend knees. Hold _30__ seconds. Repeat _3__ times per session. Do _2-3__ sessions per day.   Quads / HF, Prone KNEE: Quadriceps - Prone    Place strap around ankle. Bring ankle toward buttocks. Press hip into surface. Hold 30 seconds. Repeat 3 times per session. Do 2-3 sessions per day.  SIT TO STAND: No Device    Sit with feet shoulder-width apart, on floor. Lean chest forward, raise hips up from surface. Straighten hips and knees. Weight bear equally on left and right sides. _10__ reps per set, _2-3__ sets per day  TENS UNIT: This is helpful for muscle pain and spasm.   Search and Purchase a TENS 7000 2nd edition at www.tenspros.com. It should be less than $30.     TENS unit instructions: Do not shower or bathe with the unit on Turn the unit off before removing electrodes or batteries If  the electrodes lose stickiness add a drop of water to the electrodes after they are disconnected from the unit and place on plastic sheet. If you continued to have difficulty, call the TENS unit company to purchase more electrodes. Do not apply lotion on the skin area prior to use. Make sure the skin is clean and dry as this will help prolong the life of the electrodes. After use, always check skin for unusual red areas, rash or other skin difficulties. If there are any skin problems, does not apply electrodes to the same area. Never remove the electrodes from the unit by pulling the wires. Do not use the TENS unit or electrodes other than as directed. Do not change electrode placement without consultating your therapist or physician. Keep 2 fingers with between each electrode.

## 2017-08-09 NOTE — Therapy (Signed)
[facility] [address] [place], [place], [address] Phone: [phone]   Fax:  [phone]  Physical Therapy Evaluation  Patient Details  Name: Michaela Holland MRN: [id number] Date of Birth: 30-Sep-1980 Referring Provider: Dr Doneen Poissonhristopher Blackman   Encounter Date: 08/09/2017    Past Medical History:  Diagnosis Date  . Anxiety   . Depression   . Diabetes mellitus without complication (HCC)    denies this diagnosis   . Family history of adverse reaction to anesthesia    per patient ; father was having abdominal surgery and had to be placed on medicine to keep his blood pressure up; he never had any issues with low BP before the surgery "   . GERD (gastroesophageal reflux disease)   . Gout    believes only occurred once in feet;   . Hypertension    denies this diagnosis   . Hypothyroidism   . Obesity   . Palpitations   . PCOS (polycystic ovarian syndrome)   . PONV (postoperative nausea and vomiting)    with c section; no issues with followwing procedures   . Seasonal allergies   . Seizures (HCC)    has had "pre-seizure" activity in the brain but deneis any actual seizures   . Sleep apnea    does not currently use due to insurance     Past Surgical History:  Procedure Laterality Date  . CESAREAN SECTION    . CHOLECYSTECTOMY    . ESOPHAGOGASTRODUODENOSCOPY  2001 and 2018  . GANGLION CYST EXCISION Left 2014   foot  . KNEE ARTHROSCOPY Left 06/28/2017   Procedure: LEFT KNEE ARTHROSCOPY with debridement;  Surgeon: Kathryne HitchBlackman, Christopher Y, MD;  Location: [facility];  Service: Orthopedics;  Laterality: Left;    There were no vitals filed for this visit.   Subjective Assessment - 08/09/17 1347    Subjective  Patient reports that she has had Lt knee pain on and off for years. Sh einjured the Lt knee in the spring and symptoms became constant. She was seen in PT for ~6 weeks and received injections with no change in  symtpoms. She elected to undergo Lt knee scope 06/28/17. She now has improved knee pain - less "raw" but she continues to have a throbbing pain in the Lt knee on a daily basis. Medication does not help the pain. She has gained weight since she is less active. She as been back to work for ~ 3 weeks with increased symptoms Lt knee.     Pertinent History  obesity; arthritis bilat knees and feet     Diagnostic tests  xrays; MRI     Patient Stated Goals  to help increase strength and endurance     Currently in Pain?  Yes    Pain Score  4     Pain Location  Knee    Pain Orientation  Left    Pain Descriptors / Indicators  Sore;Throbbing;Sharp    Pain Type  Surgical pain    Pain Onset  More than a month ago    Pain Frequency  Intermittent    Aggravating Factors   standing; walking; squatting; sleeping     Pain Relieving Factors  rest; ice; elevation; medication          [facility] PT Assessment - 08/09/17 0001      Assessment   Medical Diagnosis  Lt knee scope    Referring Provider  Dr Doneen Poissonhristopher Blackman    Onset Date/Surgical Date  06/28/17    Hand Dominance  Right    Next MD Visit  12/18    Prior Therapy  yes here for knee       Precautions   Precautions  None      Restrictions   Weight Bearing Restrictions  No      Balance Screen   Has the patient fallen in the past 6 months  No    Has the patient had a decrease in activity level because of a fear of falling?   No    Is the patient reluctant to leave their home because of a fear of falling?   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; walkiing; bending; lifting; squatting - currently on limted RTW schedule - 6 hours/3-4 days/wk     Leisure  25 year old daughter; household chores; cooking; Pharmacologist       Observation/Other Assessments   Focus on Therapeutic Outcomes (FOTO)   64% limitation       Sensation    Additional Comments  WFL's per pt report       Posture/Postural Control   Posture Comments  forward posture       AROM   Right Knee Extension  4    Right Knee Flexion  113    Left Knee Extension  -1    Left Knee Flexion  107      Strength   Right Hip Flexion  5/5    Right Hip Extension  5/5    Right Hip ABduction  5/5    Right Hip ADduction  5/5    Left Hip Flexion  4+/5    Left Hip Extension  4+/5    Left Hip ABduction  4+/5    Left Hip ADduction  5/5    Right Knee Flexion  5/5    Right Knee Extension  5/5    Left Knee Flexion  4+/5    Left Knee Extension  4+/5      Flexibility   Hamstrings  Rt ~ 90 deg Lt 80 deg    Quadriceps  Rt 112 Lt 97    ITB  WFL's bilat     Piriformis  tight Lt       Palpation   Palpation comment  tender medial knee joint line Lt       Ambulation/Gait   Gait Comments  ambulates without assistive device; limp onLt LE with weight bearing on Lt; Lt LE in ER in standing and with gait              Objective measurements completed on examination: See above findings.      [facility] Adult PT Treatment/Exercise - 08/09/17 0001      Knee/Hip Exercises: Stretches   Passive Hamstring Stretch  3 reps;30 seconds supine with strap     Quad Stretch  3 reps;30 seconds prone with strap     ITB Stretch  2 reps;30 seconds supine with strap     Piriformis Stretch  2 reps;30 seconds supine with strap travell     Gastroc Stretch  2 reps;30 seconds    Soleus Stretch  2 reps;30 seconds      Knee/Hip Exercises: Prone   Other Prone Exercises  glut set 10 sec x 10       Electrical  Stimulation   Electrical Stimulation Location  Lt knee    Electrical Stimulation Action  IFC    Electrical Stimulation Parameters  to tolerance    Electrical Stimulation Goals  Pain      Vasopneumatic   Number Minutes Vasopneumatic   15 minutes    Vasopnuematic Location   Knee Lt     Vasopneumatic Pressure  Low    Vasopneumatic Temperature   34 deg               PT Education - 08/09/17 1422    Education provided  Yes    Education Details  HEP TENS     Person(s) Educated  Patient    Methods  Explanation;Demonstration;Tactile cues;Handout;Verbal cues    Comprehension  Verbalized understanding;Returned demonstration;Verbal cues required;Tactile cues required          PT Long Term Goals - 08/09/17 1427      PT LONG TERM GOAL #1   Title  I with advanced HEP (09/20/17)    Time  6    Period  Weeks    Status  New      PT LONG TERM GOAL #2   Title  Increase Lt knee and hip strength =/> 5-/5 (09/20/17)    Status  New      PT LONG TERM GOAL #3   Title  Patient to report =/> 75% reduction in Lt knee pain (09/20/17)     Time  6    Period  Weeks    Status  New      PT LONG TERM GOAL #4   Title  Patient to demonstrate normalized gait pattern on even surfaces (09/20/17)     Time  6    Period  Weeks    Status  New      PT LONG TERM GOAL #5   Title  improve FOTO =/< 46% limited (09/20/17)     Time  6    Period  Weeks    Status  New             Plan - 08/09/17 1423    Clinical Impression Statement  Michaela Holland presents s/p Lt knee arthroscopic debridement 06/28/17. She has continued pain on a daily basis; limited ROM; decreased Lt LE strength; abnormal gait pattern; limited functional activity tolerance. She will benefit from PT to address problems identified.     Clinical Presentation  Stable    Clinical Decision Making  Low    Rehab Potential  Good    PT Frequency  2x / week    PT Duration  6 weeks    PT Treatment/Interventions  Patient/family education;ADLs/Self Care Home Management;Cryotherapy;Electrical Stimulation;Moist Heat;Iontophoresis 4mg /ml Dexamethasone;Ultrasound;Gait training;Functional mobility training;Therapeutic activities;Therapeutic exercise;Dry needling;Manual techniques    PT Next Visit Plan  review HEP; progress with stretching and strengthening Lt LE; gait training; modalitites as indicated     Consulted  and Agree with Plan of Care  Patient       Patient will benefit from skilled therapeutic intervention in order to improve the following deficits and impairments:  Postural dysfunction, Improper body mechanics, Abnormal gait, Decreased activity tolerance, Decreased range of motion, Decreased mobility, Decreased strength  Visit Diagnosis: Chronic pain of left knee - Plan: PT plan of care cert/re-cert  Muscle weakness (generalized) - Plan: PT plan of care cert/re-cert  Abnormal posture - Plan: PT plan of care cert/re-cert  Other abnormalities of gait and mobility - Plan: PT plan of care cert/re-cert     Problem  List Patient Active Problem List   Diagnosis Date Noted  . S/P arthroscopy of left knee 07/18/2017  . Acute pain of left knee 06/17/2017  . Meniscal cyst, left 06/17/2017  . Cyst of lateral meniscus of left knee 05/08/2017  . Anterior cruciate ligament sprain 05/08/2017  . Morbid obesity (HCC) 05/08/2017  . History of obstructive sleep apnea 04/09/2017  . Hypothyroidism 04/09/2017  . PCOS (polycystic ovarian syndrome) 04/09/2017  . Anxiety 04/09/2017  . Chronic pain of left knee 04/09/2017  . Gastroesophageal reflux disease with esophagitis 04/09/2017    Arisbel Maione Rober Minion PT, MPH 08/09/2017, 2:47 PM  [facility] [address] [place], [place], [address] Phone: [phone]   Fax:  [phone]  Name: Michaela Holland MRN: [id number] Date of Birth: April 12, 1981

## 2017-08-09 NOTE — Patient Instructions (Signed)
If you are age 36 or older, your body mass index should be between 23-30. Your Body mass index is 52.22 kg/m. If this is out of the aforementioned range listed, please consider follow up with your Primary Care Provider.  If you are age 36 or younger, your body mass index should be between 19-25. Your Body mass index is 52.22 kg/m. If this is out of the aformentioned range listed, please consider follow up with your Primary Care Provider.   Please follow up one year ar as needed.   Thank you for choosing [facility]  Dr Amada JupiterHenry Danis III

## 2017-08-13 ENCOUNTER — Encounter: Payer: Self-pay | Admitting: Physical Therapy

## 2017-08-13 ENCOUNTER — Ambulatory Visit: Payer: Self-pay | Admitting: Rehabilitative and Restorative Service Providers"

## 2017-08-13 ENCOUNTER — Ambulatory Visit (INDEPENDENT_AMBULATORY_CARE_PROVIDER_SITE_OTHER): Payer: 59 | Admitting: Physical Therapy

## 2017-08-13 DIAGNOSIS — R293 Abnormal posture: Secondary | ICD-10-CM | POA: Diagnosis not present

## 2017-08-13 DIAGNOSIS — R2689 Other abnormalities of gait and mobility: Secondary | ICD-10-CM

## 2017-08-13 DIAGNOSIS — G8929 Other chronic pain: Secondary | ICD-10-CM

## 2017-08-13 DIAGNOSIS — M25562 Pain in left knee: Secondary | ICD-10-CM

## 2017-08-13 DIAGNOSIS — M6281 Muscle weakness (generalized): Secondary | ICD-10-CM

## 2017-08-13 NOTE — Therapy (Signed)
[place], [place], [address] Phone: [phone]   Fax:  [phone]  Physical Therapy Treatment  Patient Details  Name: Michaela Holland MRN: [id number] Date of Birth: 03/08/1981 Referring Provider: Dr. Jean Rosenthal   Encounter Date: 08/13/2017  PT End of Session - 08/13/17 1016    Visit Number  2    Number of Visits  12    PT Start Time  0941 pt arrived late    PT Stop Time  1030    PT Time Calculation (min)  49 min       Past Medical History:  Diagnosis Date  . Anxiety   . Depression   . Diabetes mellitus without complication ([place])    denies this diagnosis   . Family history of adverse reaction to anesthesia    per patient ; father was having abdominal surgery and had to be placed on medicine to keep his blood pressure up; he never had any issues with low BP before the surgery "   . GERD (gastroesophageal reflux disease)   . Gout    believes only occurred once in feet;   . Hypertension    denies this diagnosis   . Hypothyroidism   . Obesity   . Palpitations   . PCOS (polycystic ovarian syndrome)   . PONV (postoperative nausea and vomiting)    with c section; no issues with followwing procedures   . Seasonal allergies   . Seizures ([place])    has had "pre-seizure" activity in the brain but deneis any actual seizures   . Sleep apnea    does not currently use due to insurance     Past Surgical History:  Procedure Laterality Date  . CESAREAN SECTION    . CHOLECYSTECTOMY    . ESOPHAGOGASTRODUODENOSCOPY  2001 and 2018  . GANGLION CYST EXCISION Left 2014   foot  . KNEE ARTHROSCOPY Left 06/28/2017   Procedure: LEFT KNEE ARTHROSCOPY with debridement;  Surgeon: Mcarthur Rossetti, MD;  Location: [facility];  Service: Orthopedics;  Laterality: Left;    There were no vitals filed for this visit.  Subjective Assessment - 08/13/17 0946    Subjective  Pt reports she is doing 1/2  shifts at work through next wk; resume normal shifts 12/16. She has been doing HEP and icing, and this has helped. She has noticed more crunching in Lt knee since surgery.      Patient Stated Goals  to help increase strength and endurance     Currently in Pain?  Yes    Pain Score  3     Pain Location  Knee    Pain Orientation  Left    Pain Descriptors / Indicators  Sore    Aggravating Factors   prolonged standing     Pain Relieving Factors  rest, ice, medication          [facility] PT Assessment - 08/13/17 0001      Assessment   Medical Diagnosis  Lt knee scope    Referring Provider  Dr. Jean Rosenthal    Onset Date/Surgical Date  06/28/17    Hand Dominance  Right    Next MD Visit  needs to schedule at end of dec.        [facility] Adult PT Treatment/Exercise - 08/13/17 0001      Knee/Hip Exercises: Stretches   Passive Hamstring Stretch  3 reps;30 seconds supine with strap     Sports administrator  3 reps;30 seconds prone with strap     ITB Stretch  Left;2 reps;20 seconds standing; supine version bothered back.     Gastroc Stretch  Left;2 reps;30 seconds heel off step; improved tolerance    Soleus Stretch  Left;2 reps;30 seconds runner's stretch      Knee/Hip Exercises: Aerobic   Nustep  L4: 5 min (slow speed)      Knee/Hip Exercises: Standing   Step Down  Right;1 set;10 reps and step up backwards with LLE. slow controlled motion      Knee/Hip Exercises: Supine   Straight Leg Raises  Left;10 reps      Electrical Stimulation   Electrical Stimulation Location  Lt knee     Electrical Stimulation Action  IFC    Electrical Stimulation Parameters  to tolerance    Electrical Stimulation Goals  Pain      Vasopneumatic   Number Minutes Vasopneumatic   15 minutes    Vasopnuematic Location   Knee Lt     Vasopneumatic Pressure  Low    Vasopneumatic Temperature   34 deg       Manual Therapy   Manual Therapy  Taping    Manual therapy comments  I strip of sensitive skin Rock tape placed  with 15% stretch over medial Lt knee to decrease pain, decompress tissue, increase proprioception, assist with scar management.                   PT Long Term Goals - 08/09/17 1427      PT LONG TERM GOAL #1   Title  I with advanced HEP (09/20/17)    Time  6    Period  Weeks    Status  New      PT LONG TERM GOAL #2   Title  Increase Lt knee and hip strength =/> 5-/5 (09/20/17)    Status  New      PT LONG TERM GOAL #3   Title  Patient to report =/> 75% reduction in Lt knee pain (09/20/17)     Time  6    Period  Weeks    Status  New      PT LONG TERM GOAL #4   Title  Patient to demonstrate normalized gait pattern on even surfaces (09/20/17)     Time  6    Period  Weeks    Status  New      PT LONG TERM GOAL #5   Title  improve FOTO =/< 46% limited (09/20/17)     Time  6    Period  Weeks    Status  New            Plan - 08/13/17 1307    Clinical Impression Statement  HEP exercises modified to improve compliance and comfort.  Pt reported reduction of pain wiht use of Estim/vaso at end of session.  No goals met yet; only 2nd visit.     Rehab Potential  Good    PT Frequency  2x / week    PT Duration  6 weeks    PT Treatment/Interventions  Patient/family education;ADLs/Self Care Home Management;Cryotherapy;Electrical Stimulation;Moist Heat;Iontophoresis 65m/ml Dexamethasone;Ultrasound;Gait training;Functional mobility training;Therapeutic activities;Therapeutic exercise;Dry needling;Manual techniques    PT Next Visit Plan  review HEP; progress with stretching and strengthening Lt LE; gait training; modalitites as indicated. assess response to Rock tape.     Consulted and Agree with Plan of Care  Patient       Patient will benefit  from skilled therapeutic intervention in order to improve the following deficits and impairments:  Postural dysfunction, Improper body mechanics, Abnormal gait, Decreased activity tolerance, Decreased range of motion, Decreased mobility,  Decreased strength  Visit Diagnosis: Chronic pain of left knee  Muscle weakness (generalized)  Abnormal posture  Other abnormalities of gait and mobility     Problem List Patient Active Problem List   Diagnosis Date Noted  . S/P arthroscopy of left knee 07/18/2017  . Acute pain of left knee 06/17/2017  . Meniscal cyst, left 06/17/2017  . Cyst of lateral meniscus of left knee 05/08/2017  . Anterior cruciate ligament sprain 05/08/2017  . Morbid obesity ([place]) 05/08/2017  . History of obstructive sleep apnea 04/09/2017  . Hypothyroidism 04/09/2017  . PCOS (polycystic ovarian syndrome) 04/09/2017  . Anxiety 04/09/2017  . Chronic pain of left knee 04/09/2017  . Gastroesophageal reflux disease with esophagitis 04/09/2017   Kerin Perna, PTA 08/13/17 1:08 PM  [facility] [place], [place], [address] Phone: [phone]   Fax:  [phone]  Name: Michaela Holland MRN: [id number] Date of Birth: [birth year]

## 2017-08-14 ENCOUNTER — Ambulatory Visit (INDEPENDENT_AMBULATORY_CARE_PROVIDER_SITE_OTHER): Payer: 59 | Admitting: Licensed Clinical Social Worker

## 2017-08-14 DIAGNOSIS — F331 Major depressive disorder, recurrent, moderate: Secondary | ICD-10-CM

## 2017-08-15 ENCOUNTER — Encounter: Payer: Self-pay | Admitting: Physical Therapy

## 2017-08-16 ENCOUNTER — Encounter (INDEPENDENT_AMBULATORY_CARE_PROVIDER_SITE_OTHER): Payer: Self-pay | Admitting: Orthopaedic Surgery

## 2017-08-16 ENCOUNTER — Ambulatory Visit (INDEPENDENT_AMBULATORY_CARE_PROVIDER_SITE_OTHER): Payer: 59 | Admitting: Physical Therapy

## 2017-08-16 ENCOUNTER — Encounter: Payer: Self-pay | Admitting: Rehabilitative and Restorative Service Providers"

## 2017-08-16 ENCOUNTER — Encounter: Payer: Self-pay | Admitting: Physical Therapy

## 2017-08-16 ENCOUNTER — Encounter (HOSPITAL_COMMUNITY): Payer: Self-pay | Admitting: Licensed Clinical Social Worker

## 2017-08-16 DIAGNOSIS — R293 Abnormal posture: Secondary | ICD-10-CM

## 2017-08-16 DIAGNOSIS — G8929 Other chronic pain: Secondary | ICD-10-CM | POA: Diagnosis not present

## 2017-08-16 DIAGNOSIS — R2689 Other abnormalities of gait and mobility: Secondary | ICD-10-CM

## 2017-08-16 DIAGNOSIS — M25562 Pain in left knee: Secondary | ICD-10-CM | POA: Diagnosis not present

## 2017-08-16 DIAGNOSIS — M6281 Muscle weakness (generalized): Secondary | ICD-10-CM

## 2017-08-16 NOTE — Therapy (Signed)
[facility] [address] [place], [place], [address] Phone: [phone]   Fax:  [phone]  Physical Therapy Treatment  Patient Details  Name: Michaela Holland MRN: [id number] Date of Birth: March 12, 1981 Referring Provider: Dr. Doneen Poissonhristopher Blackman   Encounter Date: 08/16/2017  PT End of Session - 08/16/17 1105    Visit Number  3    Number of Visits  12    PT Start Time  1019 pt arrived late    PT Stop Time  1112    PT Time Calculation (min)  53 min       Past Medical History:  Diagnosis Date  . Anxiety   . Depression   . Diabetes mellitus without complication ([facility])    denies this diagnosis   . Family history of adverse reaction to anesthesia    per patient ; father was having abdominal surgery and had to be placed on medicine to keep his blood pressure up; he never had any issues with low BP before the surgery "   . GERD (gastroesophageal reflux disease)   . Gout    believes only occurred once in feet;   . Hypertension    denies this diagnosis   . Hypothyroidism   . Obesity   . Palpitations   . PCOS (polycystic ovarian syndrome)   . PONV (postoperative nausea and vomiting)    with c section; no issues with followwing procedures   . Seasonal allergies   . Seizures ([facility])    has had "pre-seizure" activity in the brain but deneis any actual seizures   . Sleep apnea    does not currently use due to insurance     Past Surgical History:  Procedure Laterality Date  . CESAREAN SECTION    . CHOLECYSTECTOMY    . ESOPHAGOGASTRODUODENOSCOPY  2001 and 2018  . GANGLION CYST EXCISION Left 2014   foot  . KNEE ARTHROSCOPY Left 06/28/2017   Procedure: LEFT KNEE ARTHROSCOPY with debridement;  Surgeon: Kathryne HitchBlackman, Christopher Y, MD;  Location: [facility];  Service: Orthopedics;  Laterality: Left;    There were no vitals filed for this visit.  Subjective Assessment - 08/16/17 1024    Subjective  Pt reports the Rock tape helped  some.  She is 2 days into her work schedule.  she reports her ganglion cyst in Lt foot feels like it is back.  She has been doing the stretches daily.     Patient Stated Goals  to help increase strength and endurance     Currently in Pain?  Yes    Pain Score  3     Pain Location  Knee    Pain Orientation  Left    Aggravating Factors   prolonged standing     Pain Relieving Factors  rest, ice, medication         [facility] PT Assessment - 08/16/17 0001      Assessment   Medical Diagnosis  Lt knee scope    Referring Provider  Dr. Doneen Poissonhristopher Blackman    Onset Date/Surgical Date  06/28/17    Hand Dominance  Right    Next MD Visit  needs to schedule at end of dec.       Strength   Left Hip Extension  -- 5-/5    Left Hip ABduction  4+/5      Flexibility   Hamstrings  85 deg LLE    Quadriceps  Lt 105 deg, Rt 120 deg  Palpation   Palpation comment  tender medial knee joint line Lt         [facility] Adult PT Treatment/Exercise - 08/16/17 0001      Knee/Hip Exercises: Stretches   Passive Hamstring Stretch  30 seconds;Right;Left;2 reps supine with strap     Quad Stretch  3 reps;30 seconds prone with strap     ITB Stretch  Left;2 reps;20 seconds standing, leaning to hip    Piriformis Stretch  Left;1 rep;60 seconds modified pigeon pose with RLE off of table.     Gastroc Stretch  Left;2 reps;30 seconds;Right heel off step      Knee/Hip Exercises: Aerobic   Recumbent Bike  L1: 6 min       Knee/Hip Exercises: Sidelying   Hip ABduction  Strengthening;Left;2 sets;10 reps    Clams  1 set of 10 LLE      Electrical Stimulation   Electrical Stimulation Location  Lt knee     Electrical Stimulation Action  IFC    Electrical Stimulation Parameters  to tolerance     Electrical Stimulation Goals  Pain      Vasopneumatic   Number Minutes Vasopneumatic   15 minutes    Vasopnuematic Location   Knee Lt     Vasopneumatic Pressure  Low    Vasopneumatic Temperature   34 deg       Manual  Therapy   Manual Therapy  Taping    Manual therapy comments  I strip of sensitive skin Rock tape placed with 15% stretch over medial Lt knee to decrease pain, decompress tissue, increase proprioception, assist with scar management.                   PT Long Term Goals - 08/16/17 1058      PT LONG TERM GOAL #1   Title  I with advanced HEP (09/20/17)    Time  6    Period  Weeks    Status  On-going      PT LONG TERM GOAL #2   Title  Increase Lt knee and hip strength =/> 5-/5 (09/20/17)    Time  6    Period  Weeks    Status  On-going      PT LONG TERM GOAL #3   Title  Patient to report =/> 75% reduction in Lt knee pain (09/20/17)     Time  6    Period  Weeks    Status  On-going      PT LONG TERM GOAL #4   Title  Patient to demonstrate normalized gait pattern on even surfaces (09/20/17)     Time  6    Period  Weeks    Status  On-going      PT LONG TERM GOAL #5   Title  improve FOTO =/< 46% limited (09/20/17)     Time  6    Period  Weeks    Status  On-going            Plan - 08/16/17 1041    Clinical Impression Statement  Pt demonstrated improvement in Lt hamstring and quad flexibility.  Lt hip abductors continue to be weaker than RLE. Pt tolerated treatment well, without increase in pain.  She reported reduction of symptoms after vaso/estim/ Rock tape application. Progressing towards goals.     Rehab Potential  Good    PT Frequency  2x / week    PT Duration  6 weeks    PT Treatment/Interventions  Patient/family education;ADLs/Self Care Home Management;Cryotherapy;Electrical Stimulation;Moist Heat;Iontophoresis 4mg /ml Dexamethasone;Ultrasound;Gait training;Functional mobility training;Therapeutic activities;Therapeutic exercise;Dry needling;Manual techniques    PT Next Visit Plan  progress with stretching and strengthening Lt LE; gait training; modalitites as indicated    Consulted and Agree with Plan of Care  Patient       Patient will benefit from skilled  therapeutic intervention in order to improve the following deficits and impairments:  Postural dysfunction, Improper body mechanics, Abnormal gait, Decreased activity tolerance, Decreased range of motion, Decreased mobility, Decreased strength  Visit Diagnosis: Chronic pain of left knee  Muscle weakness (generalized)  Abnormal posture  Other abnormalities of gait and mobility     Problem List Patient Active Problem List   Diagnosis Date Noted  . S/P arthroscopy of left knee 07/18/2017  . Acute pain of left knee 06/17/2017  . Meniscal cyst, left 06/17/2017  . Cyst of lateral meniscus of left knee 05/08/2017  . Anterior cruciate ligament sprain 05/08/2017  . Morbid obesity ([facility]) 05/08/2017  . History of obstructive sleep apnea 04/09/2017  . Hypothyroidism 04/09/2017  . PCOS (polycystic ovarian syndrome) 04/09/2017  . Anxiety 04/09/2017  . Chronic pain of left knee 04/09/2017  . Gastroesophageal reflux disease with esophagitis 04/09/2017   Mayer CamelJennifer Carlson-Long, PTA 08/16/17 12:48 PM  [facility] [address] [place], [place], [address] Phone: [phone]   Fax:  [phone]  Name: Michaela Holland MRN: [id number] Date of Birth: 11-08-1980

## 2017-08-19 ENCOUNTER — Encounter: Payer: Self-pay | Admitting: Rehabilitative and Restorative Service Providers"

## 2017-08-21 ENCOUNTER — Encounter: Payer: Self-pay | Admitting: Physical Therapy

## 2017-08-21 ENCOUNTER — Encounter (HOSPITAL_COMMUNITY): Payer: Self-pay | Admitting: Licensed Clinical Social Worker

## 2017-08-21 ENCOUNTER — Ambulatory Visit (INDEPENDENT_AMBULATORY_CARE_PROVIDER_SITE_OTHER): Payer: 59 | Admitting: Licensed Clinical Social Worker

## 2017-08-21 DIAGNOSIS — F331 Major depressive disorder, recurrent, moderate: Secondary | ICD-10-CM

## 2017-08-21 NOTE — Progress Notes (Signed)
   THERAPIST PROGRESS NOTE  Session Time: 2:10-3pm  Participation Level: Active  Behavioral Response: CasualAlertAnxious  Type of Therapy: Individual Therapy  Treatment Goals addressed: Anxiety  Interventions: CBT  Summary: Makelle K HanksGarald Balding is a 36 y.o. female who presents for her individual counseling session. Pt reports she and her husband have been working together to change their communication style. Pt reports they have been working on using I statements and also have discussed in length the fair fighting rules. They have both reviewed the safe conversations workbook but have not begun working on it jointly as her husband is sick. Pt goes back to work fulltime next week. Processed her fears with pt. Taught pt how journaling and meditation may assist her when working 12 hour days. Pt reports she is using the deep breathing and self compassion mindfulness skills taught in session.  Suicidal/Homicidal: Nowithout intent/plan  Therapist Response:  Assessed pt's current functioning and reviewed progress. Assisted pt effective communication skills, return to work, Orthoptistjournaling, meditation. Assisted pt processing for the management of stressors.  Plan: Return again in 2 weeks.  Diagnosis: Axis I: Moderate episode of recurrent major depressive disorder    MACKENZIE,LISBETH S, LCAS 08/21/2017

## 2017-08-22 NOTE — Progress Notes (Signed)
   THERAPIST PROGRESS NOTE  Session Time: 2:10-3pm  Participation Level: Active  Behavioral Response: CasualAlertAnxious  Type of Therapy: Individual Therapy  Treatment Goals addressed: Anxiety  Interventions: CBT  Summary: Michaela Holland is a 36 y.o. female who presents for her individual counseling session. Pt present tearful today. She is struggling with the pain of her knee, financial probs due to working a limited schedule and marital arguments. Processed with pt her feelings around her stressors. Pt is most concerned about her communication skills with her spouse. Taught pt effective communication skills and role played. Discussed with pt fair fighting rules and gave her a copy for she and her spouse to discuss. Taught pt the premise on [employer] Conversations workbook. Gave pt workbook to take home and work with her husband.       Suicidal/Homicidal: Nowithout intent/plan  Therapist Response:  Assessed pt's current functioning and reviewed progress. Assisted pt processing communication skills, fair fighting rules, safe conversations workbook. Assisted pt processing for the management of stressors.  Plan: Return again in 2 weeks.  Diagnosis: Axis I: Moderate episode of recurrent major depressive disorder    Jafeth Mustin S, LCAS 08/14/17

## 2017-08-23 ENCOUNTER — Ambulatory Visit (INDEPENDENT_AMBULATORY_CARE_PROVIDER_SITE_OTHER): Payer: 59 | Admitting: Physical Therapy

## 2017-08-23 DIAGNOSIS — M25562 Pain in left knee: Secondary | ICD-10-CM

## 2017-08-23 DIAGNOSIS — R293 Abnormal posture: Secondary | ICD-10-CM

## 2017-08-23 DIAGNOSIS — G8929 Other chronic pain: Secondary | ICD-10-CM

## 2017-08-23 DIAGNOSIS — M6281 Muscle weakness (generalized): Secondary | ICD-10-CM | POA: Diagnosis not present

## 2017-08-23 DIAGNOSIS — R2689 Other abnormalities of gait and mobility: Secondary | ICD-10-CM | POA: Diagnosis not present

## 2017-08-23 NOTE — Therapy (Signed)
[facility] [place] [address] [place], [place], [address] Phone: [phone]   Fax:  [phone]  Physical Therapy Treatment  Patient Details  Name: Michaela Holland MRN: [id number] Date of Birth: 07/28/1981 Referring Provider: Dr. Doneen Poisson   Encounter Date: 08/23/2017  PT End of Session - 08/23/17 1112    Visit Number  4    Number of Visits  12    PT Start Time  1110 pt arrived late    PT Stop Time  1207    PT Time Calculation (min)  57 min       Past Medical History:  Diagnosis Date  . Anxiety   . Depression   . Diabetes mellitus without complication ([facility])    denies this diagnosis   . Family history of adverse reaction to anesthesia    per patient ; father was having abdominal surgery and had to be placed on medicine to keep his blood pressure up; he never had any issues with low BP before the surgery "   . GERD (gastroesophageal reflux disease)   . Gout    believes only occurred once in feet;   . Hypertension    denies this diagnosis   . Hypothyroidism   . Obesity   . Palpitations   . PCOS (polycystic ovarian syndrome)   . PONV (postoperative nausea and vomiting)    with c section; no issues with followwing procedures   . Seasonal allergies   . Seizures ([facility])    has had "pre-seizure" activity in the brain but deneis any actual seizures   . Sleep apnea    does not currently use due to insurance     Past Surgical History:  Procedure Laterality Date  . CESAREAN SECTION    . CHOLECYSTECTOMY    . ESOPHAGOGASTRODUODENOSCOPY  2001 and 2018  . GANGLION CYST EXCISION Left 2014   foot  . KNEE ARTHROSCOPY Left 06/28/2017   Procedure: LEFT KNEE ARTHROSCOPY with debridement;  Surgeon: Kathryne Hitch, MD;  Location: [facility];  Service: Orthopedics;  Laterality: Left;    There were no vitals filed for this visit.  Subjective Assessment - 08/23/17 1112    Subjective  Pt reports she had to work 4  shifts last wk.  She is tearful regarding ongoing pain in Lt knee.  She has been doing stretches, which have helped. She has had reoccuring episodes of Lt knee buckling which has concerned her.     Currently in Pain?  Yes    Pain Score  3     Pain Location  Knee    Pain Orientation  Left    Pain Descriptors / Indicators  Sore         [facility] PT Assessment - 08/23/17 0001      Assessment   Medical Diagnosis  Lt knee scope    Referring Provider  Dr. Doneen Poisson    Onset Date/Surgical Date  06/28/17    Hand Dominance  Right    Next MD Visit  needs to schedule at end of dec.       Flexibility   Quadriceps  Lt 115 deg      Palpation   Palpation comment  tender medial knee joint line Lt         [facility] Adult PT Treatment/Exercise - 08/23/17 0001      Knee/Hip Exercises: Stretches   Quad Stretch  3 reps;30 seconds prone with strap, towel above knee  ITB Stretch  Left;2 reps;20 seconds standing, leaning to hip    Gastroc Stretch  Left;2 reps;30 seconds;Right heel off step      Knee/Hip Exercises: Aerobic   Nustep  L5: 5 min       Knee/Hip Exercises: Standing   Forward Step Up  Left;1 set;10 reps;Hand Hold: 2 3" step; eccentric control      Modalities   Modalities  Moist Heat      Moist Heat Therapy   Number Minutes Moist Heat  10 Minutes during vaso    Moist Heat Location  Lumbar Spine      Electrical Stimulation   Electrical Stimulation Location  Lt knee    Electrical Stimulation Action  IFC    Electrical Stimulation Parameters  to tolerance     Electrical Stimulation Goals  Pain      Vasopneumatic   Number Minutes Vasopneumatic   15 minutes    Vasopnuematic Location   Knee Lt     Vasopneumatic Pressure  Low    Vasopneumatic Temperature   34 deg       Manual Therapy   Manual Therapy  Taping;Soft tissue mobilization    Manual therapy comments  I strip of sensitive skin Rock tape placed with 15% stretch over medial Lt knee to decrease pain, decompress  tissue, increase proprioception, assist with scar management.  Additional piece placed on lateral portion of knee to assist in improved tracking.     Soft tissue mobilization  STM, including muscle stripping to Lt quad and distal ITB; pt very sensitive and tender, palpable tightness in mid belly of quad.                   PT Long Term Goals - 08/16/17 1058      PT LONG TERM GOAL #1   Title  I with advanced HEP (09/20/17)    Time  6    Period  Weeks    Status  On-going      PT LONG TERM GOAL #2   Title  Increase Lt knee and hip strength =/> 5-/5 (09/20/17)    Time  6    Period  Weeks    Status  On-going      PT LONG TERM GOAL #3   Title  Patient to report =/> 75% reduction in Lt knee pain (09/20/17)     Time  6    Period  Weeks    Status  On-going      PT LONG TERM GOAL #4   Title  Patient to demonstrate normalized gait pattern on even surfaces (09/20/17)     Time  6    Period  Weeks    Status  On-going      PT LONG TERM GOAL #5   Title  improve FOTO =/< 46% limited (09/20/17)     Time  6    Period  Weeks    Status  On-going            Plan - 08/23/17 1248    Clinical Impression Statement  Palpable fascial tightness found in pt's distal to mid quad, very tender with manual therapy. Encouraged pt to initiate self massage in this area.  pt tolerated all exercises well, without increased pain. Pt demonstrated improved quad flexibility compared to last visit. Pt progressing towards goals.     Rehab Potential  Good    PT Frequency  2x / week    PT Duration  6 weeks  PT Treatment/Interventions  Patient/family education;ADLs/Self Care Home Management;Cryotherapy;Electrical Stimulation;Moist Heat;Iontophoresis 4mg /ml Dexamethasone;Ultrasound;Gait training;Functional mobility training;Therapeutic activities;Therapeutic exercise;Dry needling;Manual techniques    PT Next Visit Plan  Manual therapy to Lt quad, taping with sensitive skin Rock tape for tracking; cont quad  eccentric strengthening.     Consulted and Agree with Plan of Care  Patient       Patient will benefit from skilled therapeutic intervention in order to improve the following deficits and impairments:  Postural dysfunction, Improper body mechanics, Abnormal gait, Decreased activity tolerance, Decreased range of motion, Decreased mobility, Decreased strength  Visit Diagnosis: Chronic pain of left knee  Muscle weakness (generalized)  Abnormal posture  Other abnormalities of gait and mobility     Problem List Patient Active Problem List   Diagnosis Date Noted  . S/P arthroscopy of left knee 07/18/2017  . Acute pain of left knee 06/17/2017  . Meniscal cyst, left 06/17/2017  . Cyst of lateral meniscus of left knee 05/08/2017  . Anterior cruciate ligament sprain 05/08/2017  . Morbid obesity ([facility]) 05/08/2017  . History of obstructive sleep apnea 04/09/2017  . Hypothyroidism 04/09/2017  . PCOS (polycystic ovarian syndrome) 04/09/2017  . Anxiety 04/09/2017  . Chronic pain of left knee 04/09/2017  . Gastroesophageal reflux disease with esophagitis 04/09/2017   Mayer CamelJennifer Carlson-Long, PTA 08/23/17 12:52 PM  [facility] [address] [place], [place], [address] Phone: [phone]   Fax:  [phone]  Name: Garald Baldingnne K Regino MRN: [id number] Date of Birth: 1981-02-01

## 2017-08-26 ENCOUNTER — Encounter: Payer: Self-pay | Admitting: Physical Therapy

## 2017-08-29 ENCOUNTER — Ambulatory Visit (INDEPENDENT_AMBULATORY_CARE_PROVIDER_SITE_OTHER): Payer: 59 | Admitting: Osteopathic Medicine

## 2017-08-29 ENCOUNTER — Encounter: Payer: Self-pay | Admitting: Physical Therapy

## 2017-08-29 ENCOUNTER — Ambulatory Visit (HOSPITAL_COMMUNITY): Payer: Self-pay | Admitting: Licensed Clinical Social Worker

## 2017-08-29 ENCOUNTER — Encounter: Payer: Self-pay | Admitting: Osteopathic Medicine

## 2017-08-29 VITALS — BP 130/75 | HR 80 | Temp 97.9°F | Wt 306.9 lb

## 2017-08-29 DIAGNOSIS — J069 Acute upper respiratory infection, unspecified: Secondary | ICD-10-CM | POA: Diagnosis not present

## 2017-08-29 MED ORDER — PREDNISONE 20 MG PO TABS: 20.0000 mg | ORAL_TABLET | Freq: Two times a day (BID) | ORAL | 0 refills | Status: DC

## 2017-08-29 MED ORDER — IPRATROPIUM BROMIDE 0.06 % NA SOLN
2.0000 | Freq: Four times a day (QID) | NASAL | 1 refills | Status: DC
Start: 2017-08-29 — End: 2017-10-22

## 2017-08-29 MED ORDER — AMOXICILLIN-POT CLAVULANATE 875-125 MG PO TABS: 1.0000 | ORAL_TABLET | Freq: Two times a day (BID) | ORAL | 0 refills | Status: DC

## 2017-08-29 MED FILL — predniSONE 20 MG TABS: 20 | 5 days supply | Qty: 10 | Fill #0

## 2017-08-29 MED FILL — AMOX TR-K CLV 875-125 MG TA: 875-125 | 7 days supply | Qty: 14 | Fill #0

## 2017-08-29 NOTE — Patient Instructions (Signed)
Over-the-Counter Medications & Home Remedies for Upper Respiratory Illness  Note: the following list assumes no pregnancy, normal liver & kidney function and no other drug interactions. Dr. Roschelle Calandra has highlighted medications which are safe for you to use, but these may not be appropriate for everyone. Always ask a pharmacist or qualified medical provider if you have any questions!   Aches/Pains, Fever, Headache Acetaminophen (Tylenol) 500 mg tablets - take max 2 tablets (1000 mg) every 6 hours (4 times per day)  Ibuprofen (Motrin) 200 mg tablets - take max 4 tablets (800 mg) every 6 hours*  Sinus Congestion Prescription Atrovent as directed Nasal Saline if desired Oxymetolazone (Afrin, others) sparing use due to rebound congestion, NEVER use in kids Phenylephrine (Sudafed) 10 mg tablets every 4 hours (or the 12-hour formulation)* Diphenhydramine (Benadryl) 25 mg tablets - take max 2 tablets every 4 hours  Cough & Sore Throat Prescription cough pills or syrups as directed Dextromethorphan (Robitussin, others) - cough suppressant Guaifenesin (Robitussin, Mucinex, others) - expectorant (helps cough up mucus) (Dextromethorphan and Guaifenesin also come in a combination tablet) Lozenges w/ Benzocaine + Menthol (Cepacol) Honey - as much as you want! Teas which "coat the throat" - look for ingredients Elm Bark, Licorice Root, Marshmallow Root  Other Antibiotics if these are prescribed - take ALL, even if you're feeling better  Zinc Lozenges within 24 hours of symptoms onset - mixed evidence this shortens the duration of the common cold Don't waste your money on Vitamin C or Echinacea  *Caution in patients with high blood pressure   

## 2017-08-29 NOTE — Progress Notes (Signed)
HPI: Michaela Holland is a 36 y.o. female who  has a past medical history of Anxiety, Depression, Diabetes mellitus without complication (HCC), Family history of adverse reaction to anesthesia, GERD (gastroesophageal reflux disease), Gout, Hypertension, Hypothyroidism, Obesity, Palpitations, PCOS (polycystic ovarian syndrome), PONV (postoperative nausea and vomiting), Seasonal allergies, Seizures (HCC), and Sleep apnea.  she presents to [facility] today, 08/29/17,  for chief complaint of:  Chief Complaint  Patient presents with  . URI    Cough and sinus congestion x5 days now, getting a little better but worried about progressing -she reports that typically she will experience cold/viral URI type symptoms and this will evolve into sinus/bronchitis issues.  This time, most bothersome symptom is sinus congestion and ear fullness bilaterally but a little bit worse on the left.  OTC medications have not made much difference.   Past medical, surgical, social and family history reviewed:  Patient Active Problem List   Diagnosis Date Noted  . S/P arthroscopy of left knee 07/18/2017  . Acute pain of left knee 06/17/2017  . Meniscal cyst, left 06/17/2017  . Cyst of lateral meniscus of left knee 05/08/2017  . Anterior cruciate ligament sprain 05/08/2017  . Morbid obesity (HCC) 05/08/2017  . History of obstructive sleep apnea 04/09/2017  . Hypothyroidism 04/09/2017  . PCOS (polycystic ovarian syndrome) 04/09/2017  . Anxiety 04/09/2017  . Chronic pain of left knee 04/09/2017  . Gastroesophageal reflux disease with esophagitis 04/09/2017    Past Surgical History:  Procedure Laterality Date  . CESAREAN SECTION    . CHOLECYSTECTOMY    . ESOPHAGOGASTRODUODENOSCOPY  2001 and 2018  . GANGLION CYST EXCISION Left 2014   foot  . KNEE ARTHROSCOPY Left 06/28/2017   Procedure: LEFT KNEE ARTHROSCOPY with debridement;  Surgeon: Kathryne Hitch, MD;  Location: [facility];  Service: Orthopedics;  Laterality: Left;    Social History   Tobacco Use  . Smoking status: Never Smoker  . Smokeless tobacco: Never Used  Substance Use Topics  . Alcohol use: Yes    Comment: occasioanlly     Family History  Problem Relation Age of Onset  . Polycystic ovary syndrome Mother   . Hypothyroidism Mother   . Polycystic ovary syndrome Sister   . Hypothyroidism Sister      Current medication list and allergy/intolerance information reviewed:    Current Outpatient Medications  Medication Sig Dispense Refill  . clonazePAM (KLONOPIN) 0.5 MG tablet Take 0.5-1 tablets (0.25-0.5 mg total) by mouth 2 (two) times daily as needed for anxiety (use sparingly to prevent tolerance/dependence). (Patient taking differently: Take 0.5 mg by mouth 2 (two) times daily as needed for anxiety (use sparingly to prevent tolerance/dependence). ) 30 tablet 0  . colchicine 0.6 MG tablet Take 2 tabs (1.2mg ) followed by 1 tab (0.6mg ) 1 hour later. Then 1 tab (0.6mg ) daily until pain resolves (Patient taking differently: Take 0.6-1.2 mg by mouth 2 (two) times daily as needed (for gout flare up). ) 8 tablet 0  . diclofenac sodium (VOLTAREN) 1 % GEL Apply 4 g topically 4 (four) times daily. To affected joint. (Patient taking differently: Apply 4 g topically 4 (four) times daily as needed (for joint pain.). ) 100 g 11  . FLUoxetine (PROZAC) 10 MG capsule Take 1 capsule (10 mg total) by mouth daily. 10 mg daily x 1 week, then increase to 20 mg 7 capsule 0  . FLUoxetine (PROZAC) 20 MG capsule Take 1 capsule (20 mg total) by mouth daily.  90 capsule 1  . ibuprofen (ADVIL,MOTRIN) 200 MG tablet Take 800 mg by mouth every 8 (eight) hours as needed (for pain.).    Marland Kitchen. metFORMIN (GLUCOPHAGE-XR) 500 MG 24 hr tablet Take 1 tablet (500 mg total) by mouth at bedtime. 90 tablet 3  . omeprazole (PRILOSEC) 40 MG capsule Take 1 capsule (40 mg total) by mouth daily. (Patient taking differently: Take 40 mg by mouth every  evening. ) 30 capsule 3  . ranitidine (ZANTAC) 150 MG tablet Take 150 mg by mouth daily as needed for heartburn.      No current facility-administered medications for this visit.     Allergies  Allergen Reactions  . Sulfa Antibiotics Hives and Shortness Of Breath  . Other     Nuts severe reaction difficulty breathing , throat swelling; mostly tree nuts but not all tree nuts       Review of Systems:  Constitutional:  No  fever, +chills, +recent illness, No unintentional weight changes. +significant fatigue.   HEENT: No  headache, no vision change, no hearing change, +sore throat, +sinus pressure  Cardiac: No  chest pain, No  pressure, No palpitations, No  Orthopnea  Respiratory:  No  shortness of breath. +Cough  Gastrointestinal: No  abdominal pain, No  nausea, No  vomiting,   Musculoskeletal: No new myalgia/arthralgia  Skin: No  Rash   Exam:  BP 130/75   Pulse 80   Temp 97.9 F (36.6 C)   Wt (!) 306 lb 14.4 oz (139.2 kg)   BMI 52.68 kg/m   Constitutional: VS see above. General Appearance: alert, well-developed, well-nourished, NAD  Eyes: Normal lids and conjunctive, non-icteric sclera  Ears, Nose, Mouth, Throat: MMM, Normal external inspection ears/nares/mouth/lips/gums. TM normal bilaterally. Pharynx/tonsils +erythema, no exudate. Nasal mucosa normal.   Neck: No masses, trachea midline. No thyroid enlargement. No tenderness/mass appreciated. No lymphadenopathy  Respiratory: Normal respiratory effort. no wheeze, no rhonchi, no rales  Cardiovascular: S1/S2 normal, no murmur, no rub/gallop auscultated. RRR.     ASSESSMENT/PLAN:   Viral URI   Meds ordered this encounter  Medications  . predniSONE (DELTASONE) 20 MG tablet    Sig: Take 1 tablet (20 mg total) by mouth 2 (two) times daily with a meal.    Dispense:  10 tablet    Refill:  0  . ipratropium (ATROVENT) 0.06 % nasal spray    Sig: Place 2 sprays into both nostrils 4 (four) times daily.     Dispense:  15 mL    Refill:  1  . amoxicillin-clavulanate (AUGMENTIN) 875-125 MG tablet    Sig: Take 1 tablet by mouth 2 (two) times daily. Fill if sinus pressure/pain worse. Expires 09/09/17    Dispense:  14 tablet    Refill:  0     Patient Instructions  Over-the-Counter Medications & Home Remedies for Upper Respiratory Illness  Note: the following list assumes no pregnancy, normal liver & kidney function and no other drug interactions. Dr. Lyn HollingsheadAlexander has highlighted medications which are safe for you to use, but these may not be appropriate for everyone. Always ask a pharmacist or qualified medical provider if you have any questions!   Aches/Pains, Fever, Headache Acetaminophen (Tylenol) 500 mg tablets - take max 2 tablets (1000 mg) every 6 hours (4 times per day)  Ibuprofen (Motrin) 200 mg tablets - take max 4 tablets (800 mg) every 6 hours*  Sinus Congestion Prescription Atrovent as directed Nasal Saline if desired Oxymetolazone (Afrin, others) sparing use due to rebound congestion,  NEVER use in kids Phenylephrine (Sudafed) 10 mg tablets every 4 hours (or the 12-hour formulation)* Diphenhydramine (Benadryl) 25 mg tablets - take max 2 tablets every 4 hours  Cough & Sore Throat Prescription cough pills or syrups as directed Dextromethorphan (Robitussin, others) - cough suppressant Guaifenesin (Robitussin, Mucinex, others) - expectorant (helps cough up mucus) (Dextromethorphan and Guaifenesin also come in a combination tablet) Lozenges w/ Benzocaine + Menthol (Cepacol) Honey - as much as you want! Teas which "coat the throat" - look for ingredients Elm Bark, Licorice Root, Marshmallow Root  Other Antibiotics if these are prescribed - take ALL, even if you're feeling better  Zinc Lozenges within 24 hours of symptoms onset - mixed evidence this shortens the duration of the common cold Don't waste your money on Vitamin C or Echinacea  *Caution in patients with high blood  pressure       Visit summary with medication list and pertinent instructions was printed for patient to review. All questions at time of visit were answered - patient instructed to contact office with any additional concerns. ER/RTC precautions were reviewed with the patient.   Follow-up plan: Return if symptoms worsen or fail to improve.  Note: Total time spent 25 minutes, greater than 50% of the visit was spent face-to-face counseling and coordinating care for the following: The encounter diagnosis was Viral URI.Marland Kitchen.  Please note: voice recognition software was used to produce this document, and typos may escape review. Please contact Dr. Lyn HollingsheadAlexander for any needed clarifications.

## 2017-08-30 ENCOUNTER — Encounter: Payer: Self-pay | Admitting: Physical Therapy

## 2017-09-04 ENCOUNTER — Encounter: Payer: Self-pay | Admitting: Osteopathic Medicine

## 2017-09-04 ENCOUNTER — Ambulatory Visit (INDEPENDENT_AMBULATORY_CARE_PROVIDER_SITE_OTHER): Payer: 59 | Admitting: Osteopathic Medicine

## 2017-09-04 VITALS — BP 132/84 | HR 88

## 2017-09-04 DIAGNOSIS — M25562 Pain in left knee: Secondary | ICD-10-CM | POA: Diagnosis not present

## 2017-09-04 NOTE — Progress Notes (Signed)
HPI: Michaela Holland is a 36 y.o. female who  has a past medical history of Anxiety, Depression, Diabetes mellitus without complication (HCC), Family history of adverse reaction to anesthesia, GERD (gastroesophageal reflux disease), Gout, Hypertension, Hypothyroidism, Obesity, Palpitations, PCOS (polycystic ovarian syndrome), PONV (postoperative nausea and vomiting), Seasonal allergies, Seizures (HCC), and Sleep apnea.  she presents to [facility]  today, 09/04/17,  for chief complaint of:  Chief Complaint  Patient presents with  . Knee Pain    left    Chronic knee issues, following with ortho and has been told nothing else can really be done surgically at this point. She was doing okay, participating in PT, but then went back to working full shifts and has been having severe knee pain.     Past medical, surgical, social and family history reviewed:  Patient Active Problem List   Diagnosis Date Noted  . S/P arthroscopy of left knee 07/18/2017  . Acute pain of left knee 06/17/2017  . Meniscal cyst, left 06/17/2017  . Cyst of lateral meniscus of left knee 05/08/2017  . Anterior cruciate ligament sprain 05/08/2017  . Morbid obesity (HCC) 05/08/2017  . History of obstructive sleep apnea 04/09/2017  . Hypothyroidism 04/09/2017  . PCOS (polycystic ovarian syndrome) 04/09/2017  . Anxiety 04/09/2017  . Chronic pain of left knee 04/09/2017  . Gastroesophageal reflux disease with esophagitis 04/09/2017    Past Surgical History:  Procedure Laterality Date  . CESAREAN SECTION    . CHOLECYSTECTOMY    . ESOPHAGOGASTRODUODENOSCOPY  2001 and 2018  . GANGLION CYST EXCISION Left 2014   foot  . KNEE ARTHROSCOPY Left 06/28/2017   Procedure: LEFT KNEE ARTHROSCOPY with debridement;  Surgeon: Kathryne HitchBlackman, Christopher Y, MD;  Location: [facility];  Service: Orthopedics;  Laterality: Left;    Social History   Tobacco Use  . Smoking status: Never Smoker  . Smokeless  tobacco: Never Used  Substance Use Topics  . Alcohol use: Yes    Comment: occasioanlly     Family History  Problem Relation Age of Onset  . Polycystic ovary syndrome Mother   . Hypothyroidism Mother   . Polycystic ovary syndrome Sister   . Hypothyroidism Sister      Current medication list and allergy/intolerance information reviewed:    Current Outpatient Medications  Medication Sig Dispense Refill  . amoxicillin-clavulanate (AUGMENTIN) 875-125 MG tablet Take 1 tablet by mouth 2 (two) times daily. Fill if sinus pressure/pain worse. Expires 09/09/17 14 tablet 0  . clonazePAM (KLONOPIN) 0.5 MG tablet Take 0.5-1 tablets (0.25-0.5 mg total) by mouth 2 (two) times daily as needed for anxiety (use sparingly to prevent tolerance/dependence). (Patient taking differently: Take 0.5 mg by mouth 2 (two) times daily as needed for anxiety (use sparingly to prevent tolerance/dependence). ) 30 tablet 0  . colchicine 0.6 MG tablet Take 2 tabs (1.2mg ) followed by 1 tab (0.6mg ) 1 hour later. Then 1 tab (0.6mg ) daily until pain resolves (Patient taking differently: Take 0.6-1.2 mg by mouth 2 (two) times daily as needed (for gout flare up). ) 8 tablet 0  . diclofenac sodium (VOLTAREN) 1 % GEL Apply 4 g topically 4 (four) times daily. To affected joint. (Patient taking differently: Apply 4 g topically 4 (four) times daily as needed (for joint pain.). ) 100 g 11  . FLUoxetine (PROZAC) 10 MG capsule Take 1 capsule (10 mg total) by mouth daily. 10 mg daily x 1 week, then increase to 20 mg 7 capsule 0  . FLUoxetine (  PROZAC) 20 MG capsule Take 1 capsule (20 mg total) by mouth daily. 90 capsule 1  . ibuprofen (ADVIL,MOTRIN) 200 MG tablet Take 800 mg by mouth every 8 (eight) hours as needed (for pain.).    Marland Kitchen. ipratropium (ATROVENT) 0.06 % nasal spray Place 2 sprays into both nostrils 4 (four) times daily. 15 mL 1  . metFORMIN (GLUCOPHAGE-XR) 500 MG 24 hr tablet Take 1 tablet (500 mg total) by mouth at bedtime. 90  tablet 3  . omeprazole (PRILOSEC) 40 MG capsule Take 1 capsule (40 mg total) by mouth daily. (Patient taking differently: Take 40 mg by mouth every evening. ) 30 capsule 3  . ranitidine (ZANTAC) 150 MG tablet Take 150 mg by mouth daily as needed for heartburn.      No current facility-administered medications for this visit.     Allergies  Allergen Reactions  . Sulfa Antibiotics Hives and Shortness Of Breath  . Other     Nuts severe reaction difficulty breathing , throat swelling; mostly tree nuts but not all tree nuts       Review of Systems:  Constitutional:  No  fever, no chills, No recent illness  HEENT: No  headache, no vision change  Cardiac: No  chest pain, No  pressure  Respiratory:  No  shortness of breath  Musculoskeletal: + myalgia/arthralgia  Hem/Onc: No  easy bruising/bleeding   Exam:  BP 132/84   Pulse 88   Constitutional: VS see above. General Appearance: alert, well-developed, well-nourished, NAD  Eyes: Normal lids and conjunctive, non-icteric sclera  Ears, Nose, Mouth, Throat: MMM, Normal external inspection ears/nares/mouth/lips/gums.   Neck: No masses, trachea midline.   Respiratory: Normal respiratory effort.   Musculoskeletal: Gait favoring L knee . No clubbing/cyanosis of digits. No knee crepitus or effusion, no meniscal signs lat/med,   Neurological: Normal balance/coordination. No tremor.   Skin: warm, dry, intact. No rash/ulcer. Marland Kitchen.    Psychiatric: Normal judgment/insight. Normal mood and affect. Oriented x3.      ASSESSMENT/PLAN:   Acute pain of left knee - at this point, patient is advised to follow-up with orthopedics but she states she could not get an appointment until January, she has an appointment with Dr. Denyse Amassorey early next week. This is unlike much else we can do at this point other than right her out of work for now, work note was provided to the patient. She declines refill on pain medications at this time.     Visit  summary with medication list and pertinent instructions was printed for patient to review. All questions at time of visit were answered - patient instructed to contact office with any additional concerns. ER/RTC precautions were reviewed with the patient.   Follow-up plan: Return for recheck with Dr. Denyse Amassorey 09/09/17.  Note: Total time spent 25 minutes, greater than 50% of the visit was spent face-to-face counseling and coordinating care for the following: The encounter diagnosis was Acute pain of left knee..  Please note: voice recognition software was used to produce this document, and typos may escape review. Please contact Dr. Lyn HollingsheadAlexander for any needed clarifications.

## 2017-09-05 ENCOUNTER — Encounter: Payer: Self-pay | Admitting: Osteopathic Medicine

## 2017-09-06 ENCOUNTER — Ambulatory Visit (INDEPENDENT_AMBULATORY_CARE_PROVIDER_SITE_OTHER): Payer: 59 | Admitting: Family Medicine

## 2017-09-06 ENCOUNTER — Ambulatory Visit (INDEPENDENT_AMBULATORY_CARE_PROVIDER_SITE_OTHER): Payer: 59 | Admitting: Physical Therapy

## 2017-09-06 ENCOUNTER — Encounter: Payer: Self-pay | Admitting: Osteopathic Medicine

## 2017-09-06 ENCOUNTER — Ambulatory Visit (HOSPITAL_COMMUNITY): Payer: Self-pay | Admitting: Licensed Clinical Social Worker

## 2017-09-06 ENCOUNTER — Encounter: Payer: Self-pay | Admitting: Family Medicine

## 2017-09-06 VITALS — BP 140/88 | HR 76 | Wt 304.0 lb

## 2017-09-06 DIAGNOSIS — F419 Anxiety disorder, unspecified: Secondary | ICD-10-CM | POA: Diagnosis not present

## 2017-09-06 DIAGNOSIS — M25562 Pain in left knee: Secondary | ICD-10-CM | POA: Diagnosis not present

## 2017-09-06 DIAGNOSIS — G8929 Other chronic pain: Secondary | ICD-10-CM | POA: Diagnosis not present

## 2017-09-06 DIAGNOSIS — R293 Abnormal posture: Secondary | ICD-10-CM

## 2017-09-06 DIAGNOSIS — M6281 Muscle weakness (generalized): Secondary | ICD-10-CM

## 2017-09-06 MED ORDER — CLONAZEPAM 0.5 MG PO TABS: 0.2500 mg | ORAL_TABLET | Freq: Two times a day (BID) | ORAL | 0 refills | Status: DC | PRN

## 2017-09-06 MED ORDER — FLUCONAZOLE 150 MG PO TABS: ORAL_TABLET | ORAL | 1 refills | Status: DC

## 2017-09-06 MED ORDER — GABAPENTIN 300 MG PO CAPS: ORAL_CAPSULE | ORAL | 3 refills | Status: DC

## 2017-09-06 MED ORDER — TRAMADOL HCL 50 MG PO TABS: 50.0000 mg | ORAL_TABLET | Freq: Three times a day (TID) | ORAL | 0 refills | Status: DC | PRN

## 2017-09-06 NOTE — Therapy (Signed)
[place] [place], [place], [address] Phone: [phone]   Fax:  [phone]  Physical Therapy Treatment  Patient Details  Name: Michaela Holland MRN: [id number] Date of Birth: [birth year] Referring Provider: Dr. Jean Rosenthal   Encounter Date: 09/06/2017  PT End of Session - 09/06/17 0943    Visit Number  5    Number of Visits  12    PT Start Time  0943    PT Stop Time  1025    PT Time Calculation (min)  42 min    Activity Tolerance  Patient tolerated treatment well    Behavior During Therapy  [facility] for tasks assessed/performed       Past Medical History:  Diagnosis Date  . Anxiety   . Depression   . Diabetes mellitus without complication ([place])    denies this diagnosis   . Family history of adverse reaction to anesthesia    per patient ; father was having abdominal surgery and had to be placed on medicine to keep his blood pressure up; he never had any issues with low BP before the surgery "   . GERD (gastroesophageal reflux disease)   . Gout    believes only occurred once in feet;   . Hypertension    denies this diagnosis   . Hypothyroidism   . Obesity   . Palpitations   . PCOS (polycystic ovarian syndrome)   . PONV (postoperative nausea and vomiting)    with c section; no issues with followwing procedures   . Seasonal allergies   . Seizures ([place])    has had "pre-seizure" activity in the brain but deneis any actual seizures   . Sleep apnea    does not currently use due to insurance     Past Surgical History:  Procedure Laterality Date  . CESAREAN SECTION    . CHOLECYSTECTOMY    . ESOPHAGOGASTRODUODENOSCOPY  2001 and 2018  . GANGLION CYST EXCISION Left 2014   foot  . KNEE ARTHROSCOPY Left 06/28/2017   Procedure: LEFT KNEE ARTHROSCOPY with debridement;  Surgeon: Mcarthur Rossetti, MD;  Location: [facility];  Service: Orthopedics;  Laterality: Left;    There were no vitals filed  for this visit.  Subjective Assessment - 09/06/17 0944    Subjective  Pt ambulates into therapy with RW.  Pt tearful as she explains she tried to work her 12 hour shifts and the pain has increased immensly.  She was doing stretches and using a roller on LE to decrease tightness.  She has tried to "rest her leg" for the last 4 days, but it has remained painful.   She is worried about losing her job, unsure if she can tolerate standing that long anymore.    Pertinent History  obesity; arthritis bilat knees and feet     Patient Stated Goals  to help increase strength and endurance     Currently in Pain?  Yes    Pain Score  8     Pain Location  Leg    Pain Orientation  Left    Pain Descriptors / Indicators  Throbbing;Tightness;Sore    Aggravating Factors   prolonged standing, bending knee     Pain Relieving Factors  ice/heat.          [facility] PT Assessment - 09/06/17 0001      Assessment   Medical Diagnosis  Lt knee scope    Referring Provider  Dr. Jean Rosenthal  Onset Date/Surgical Date  06/28/17    Hand Dominance  Right    Next MD Visit  PRN      Palpation   Palpation comment  point tender in Lt bicep femoris (midbelly and lateral distal portion, and Lt quad        [facility] Adult PT Treatment/Exercise - 09/06/17 0001      Knee/Hip Exercises: Standing   Gait Training  Gait training with RW:  Step to pattern, VC to relax LLE, allow Lt foot to roll through toes during toe off and allow knee to bend during swing through; slow pace.  Quality improved with increased distance and VC (80 ft total)      Moist Heat Therapy   Number Minutes Moist Heat  12 Minutes    Moist Heat Location  Other (comment) Lt hamstring and quad      Electrical Stimulation   Electrical Stimulation Location  Lt quad and hamstring     Electrical Stimulation Action  IFC    Electrical Stimulation Parameters  to tolerance     Electrical Stimulation Goals  Pain;Tone      Manual Therapy   Soft tissue  mobilization  STM including muscle stripping to Lt hamstring, calf, and quad, to tolerance.                    PT Long Term Goals - 09/06/17 1431      PT LONG TERM GOAL #1   Title  I with advanced HEP (09/20/17)    Time  6    Period  Weeks    Status  On-going      PT LONG TERM GOAL #2   Title  Increase Lt knee and hip strength =/> 5-/5 (09/20/17)    Time  6    Period  Weeks    Status  On-going      PT LONG TERM GOAL #3   Title  Patient to report =/> 75% reduction in Lt knee pain (09/20/17)     Time  6    Period  Weeks    Status  On-going      PT LONG TERM GOAL #4   Title  Patient to demonstrate normalized gait pattern on even surfaces (09/20/17)     Time  6    Period  Weeks    Status  On-going      PT LONG TERM GOAL #5   Title  improve FOTO =/< 46% limited (09/20/17)     Time  6    Period  Weeks    Status  On-going            Plan - 09/06/17 1433    Clinical Impression Statement  pt had flare up in symptoms once returning to 12 hr shifts at work. She was very tender and had palpable tightness in her Lt hamstring (mid and distal biceps femoris) and Lt distal quad.  Pt encouraged to take gait slow to allow full toe off and knee bend with swing through (she was keeping LLE rigid with gait).  Pt reported reduction of pain by 3-4 points at end of session.  No new goals met due to flare up.  Pt to see MD after session today; limited time in session.     Rehab Potential  Good    PT Frequency  2x / week    PT Duration  6 weeks    PT Treatment/Interventions  Patient/family education;ADLs/Self Care Home Management;Cryotherapy;Electrical Stimulation;Moist Heat;Iontophoresis 66m/ml  Dexamethasone;Ultrasound;Gait training;Functional mobility training;Therapeutic activities;Therapeutic exercise;Dry needling;Manual techniques    PT Next Visit Plan  ROM/ strengthening LLE.  Gait training.  Manual therapy to LLE.     Consulted and Agree with Plan of Care  Patient        Patient will benefit from skilled therapeutic intervention in order to improve the following deficits and impairments:  Postural dysfunction, Improper body mechanics, Abnormal gait, Decreased activity tolerance, Decreased range of motion, Decreased mobility, Decreased strength  Visit Diagnosis: Chronic pain of left knee  Muscle weakness (generalized)  Abnormal posture     Problem List Patient Active Problem List   Diagnosis Date Noted  . S/P arthroscopy of left knee 07/18/2017  . Acute pain of left knee 06/17/2017  . Meniscal cyst, left 06/17/2017  . Cyst of lateral meniscus of left knee 05/08/2017  . Anterior cruciate ligament sprain 05/08/2017  . Morbid obesity ([place]) 05/08/2017  . History of obstructive sleep apnea 04/09/2017  . Hypothyroidism 04/09/2017  . PCOS (polycystic ovarian syndrome) 04/09/2017  . Anxiety 04/09/2017  . Chronic pain of left knee 04/09/2017  . Gastroesophageal reflux disease with esophagitis 04/09/2017   Kerin Perna, PTA 09/06/17 3:07 PM  [place] [place], [place], [address] Phone: [phone]   Fax:  [phone]  Name: ALEXXIS MACKERT MRN: [id number] Date of Birth: 30-Dec-1980

## 2017-09-06 NOTE — Patient Instructions (Signed)
Thank you for coming in today. Continue PT.  Start gabapentin.  Titrate to 3x daily over a few days or weeks.  Use tramadol sparingly.  As for work we will continue 3-4 6 hour shifts per week.  I think however finding a more sedentary position is better.

## 2017-09-06 NOTE — Progress Notes (Signed)
Michaela Holland is a 36 y.o. female who presents to [facility] today for left knee pain.  Defne notes continued pain in her left knee and leg.  She is transition back to work partially.  She notes the pain is present with prolonged standing.  She is able to manage 3-4 6-hour shifts per week but when she tried going to 12-hour shifts full-time her pain worsened dramatically.  She had severe pain and she is having difficulty walking and using a walker.  She continues with physical therapy notes that it somewhat helpful.  Additionally she has followed up with orthopedic surgery who does not think there is any more surgical options at this time.   Past Medical History:  Diagnosis Date  . Anxiety   . Depression   . Diabetes mellitus without complication (HCC)    denies this diagnosis   . Family history of adverse reaction to anesthesia    per patient ; father was having abdominal surgery and had to be placed on medicine to keep his blood pressure up; he never had any issues with low BP before the surgery "   . GERD (gastroesophageal reflux disease)   . Gout    believes only occurred once in feet;   . Hypertension    denies this diagnosis   . Hypothyroidism   . Obesity   . Palpitations   . PCOS (polycystic ovarian syndrome)   . PONV (postoperative nausea and vomiting)    with c section; no issues with followwing procedures   . Seasonal allergies   . Seizures (HCC)    has had "pre-seizure" activity in the brain but deneis any actual seizures   . Sleep apnea    does not currently use due to insurance    Past Surgical History:  Procedure Laterality Date  . CESAREAN SECTION    . CHOLECYSTECTOMY    . ESOPHAGOGASTRODUODENOSCOPY  2001 and 2018  . GANGLION CYST EXCISION Left 2014   foot  . KNEE ARTHROSCOPY Left 06/28/2017   Procedure: LEFT KNEE ARTHROSCOPY with debridement;  Surgeon: Kathryne Hitch, MD;  Location: [facility];  Service:  Orthopedics;  Laterality: Left;   Social History   Tobacco Use  . Smoking status: Never Smoker  . Smokeless tobacco: Never Used  Substance Use Topics  . Alcohol use: Yes    Comment: occasioanlly      ROS:  As above   Medications: Current Outpatient Medications  Medication Sig Dispense Refill  . amoxicillin-clavulanate (AUGMENTIN) 875-125 MG tablet Take 1 tablet by mouth 2 (two) times daily. Fill if sinus pressure/pain worse. Expires 09/09/17 14 tablet 0  . clonazePAM (KLONOPIN) 0.5 MG tablet Take 0.5-1 tablets (0.25-0.5 mg total) by mouth 2 (two) times daily as needed for anxiety (#30 for 90 days.). 30 tablet 0  . colchicine 0.6 MG tablet Take 2 tabs (1.2mg ) followed by 1 tab (0.6mg ) 1 hour later. Then 1 tab (0.6mg ) daily until pain resolves (Patient taking differently: Take 0.6-1.2 mg by mouth 2 (two) times daily as needed (for gout flare up). ) 8 tablet 0  . diclofenac sodium (VOLTAREN) 1 % GEL Apply 4 g topically 4 (four) times daily. To affected joint. (Patient taking differently: Apply 4 g topically 4 (four) times daily as needed (for joint pain.). ) 100 g 11  . fluconazole (DIFLUCAN) 150 MG tablet Take 1 tablet by mouth now and repeat dose in 72 hours if symptoms of yeast infection persist 2 tablet  1  . FLUoxetine (PROZAC) 10 MG capsule Take 1 capsule (10 mg total) by mouth daily. 10 mg daily x 1 week, then increase to 20 mg 7 capsule 0  . FLUoxetine (PROZAC) 20 MG capsule Take 1 capsule (20 mg total) by mouth daily. 90 capsule 1  . ibuprofen (ADVIL,MOTRIN) 200 MG tablet Take 800 mg by mouth every 8 (eight) hours as needed (for pain.).    Marland Kitchen. ipratropium (ATROVENT) 0.06 % nasal spray Place 2 sprays into both nostrils 4 (four) times daily. 15 mL 1  . metFORMIN (GLUCOPHAGE-XR) 500 MG 24 hr tablet Take 1 tablet (500 mg total) by mouth at bedtime. 90 tablet 3  . omeprazole (PRILOSEC) 40 MG capsule Take 1 capsule (40 mg total) by mouth daily. (Patient taking differently: Take 40 mg by  mouth every evening. ) 30 capsule 3  . ranitidine (ZANTAC) 150 MG tablet Take 150 mg by mouth daily as needed for heartburn.     . traMADol (ULTRAM) 50 MG tablet Take 1 tablet (50 mg total) by mouth every 8 (eight) hours as needed for severe pain. 30 tablet 0  . gabapentin (NEURONTIN) 300 MG capsule One tab PO qHS for a week, then BID for a week, then TID. May double weekly to a max of 3,600mg /day 180 capsule 3   No current facility-administered medications for this visit.    Allergies  Allergen Reactions  . Sulfa Antibiotics Hives and Shortness Of Breath  . Other     Nuts severe reaction difficulty breathing , throat swelling; mostly tree nuts but not all tree nuts      Exam:  BP 140/88   Pulse 76   Wt (!) 304 lb (137.9 kg)   BMI 52.18 kg/m  General: Well Developed, well nourished, and in no acute distress.  Neuro/Psych: Alert and oriented x3, extra-ocular muscles intact, able to move all 4 extremities, sensation grossly intact. Skin: Warm and dry, no rashes noted.  Respiratory: Not using accessory muscles, speaking in full sentences, trachea midline.  Cardiovascular: Pulses palpable, no extremity edema. Abdomen: Does not appear distended. MSK: Left knee well-appearing with no effusion or erythema.  Diffusely mildly tender.  Normal motion.   Antalgic gait present.    No results found for this or any previous visit (from the past 48 hour(s)). No results found.    Assessment and Plan: 36 y.o. female with left knee pain.  Likely guarding and myofascial disruption.  This should improve with physical therapy.  Additionally I think is reasonable to try treating with gabapentin and will use low-dose tramadol.  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  I think a more sedentary job is going to be better for her.  We spent a great deal of time talking about that today.  Klonopin and  fluconazole clarified and refilled per PCP.    No orders of the defined types were placed in this encounter.  Meds ordered this encounter  Medications  . gabapentin (NEURONTIN) 300 MG capsule    Sig: One tab PO qHS for a week, then BID for a week, then TID. May double weekly to a max of 3,600mg /day    Dispense:  180 capsule    Refill:  3  . traMADol (ULTRAM) 50 MG tablet    Sig: Take 1 tablet (50 mg total) by mouth every 8 (eight) hours as needed for severe pain.  Dispense:  30 tablet    Refill:  0  . clonazePAM (KLONOPIN) 0.5 MG tablet    Sig: Take 0.5-1 tablets (0.25-0.5 mg total) by mouth 2 (two) times daily as needed for anxiety (#30 for 90 days.).    Dispense:  30 tablet    Refill:  0  . fluconazole (DIFLUCAN) 150 MG tablet    Sig: Take 1 tablet by mouth now and repeat dose in 72 hours if symptoms of yeast infection persist    Dispense:  2 tablet    Refill:  1    Discussed warning signs or symptoms. Please see discharge instructions. Patient expresses understanding.  I spent 25 minutes with this patient, greater than 50% was face-to-face time counseling regarding ddx and treatment plan.

## 2017-09-09 ENCOUNTER — Ambulatory Visit: Payer: Self-pay | Admitting: Family Medicine

## 2017-09-11 ENCOUNTER — Ambulatory Visit (INDEPENDENT_AMBULATORY_CARE_PROVIDER_SITE_OTHER): Payer: 59 | Admitting: Physical Therapy

## 2017-09-11 ENCOUNTER — Encounter: Payer: Self-pay | Admitting: Family Medicine

## 2017-09-11 DIAGNOSIS — M6281 Muscle weakness (generalized): Secondary | ICD-10-CM

## 2017-09-11 DIAGNOSIS — M25562 Pain in left knee: Secondary | ICD-10-CM | POA: Diagnosis not present

## 2017-09-11 DIAGNOSIS — R2689 Other abnormalities of gait and mobility: Secondary | ICD-10-CM

## 2017-09-11 DIAGNOSIS — G8929 Other chronic pain: Secondary | ICD-10-CM | POA: Diagnosis not present

## 2017-09-11 DIAGNOSIS — R293 Abnormal posture: Secondary | ICD-10-CM | POA: Diagnosis not present

## 2017-09-11 NOTE — Therapy (Signed)
[facility] [place] [address] [place], [place], [address] Phone: [phone]   Fax:  [phone]  Physical Therapy Treatment  Patient Details  Name: Michaela Holland MRN: [id number] Date of Birth: [birth year] Referring Provider: Dr. Doneen Poisson    Encounter Date: 09/11/2017  PT End of Session - 09/11/17 1035    Visit Number  6    Number of Visits  12    Date for PT Re-Evaluation  09/20/17    PT Start Time  1018    PT Stop Time  1117    PT Time Calculation (min)  59 min    Activity Tolerance  Patient limited by pain;Patient tolerated treatment well    Behavior During Therapy  [facility] for tasks assessed/performed       Past Medical History:  Diagnosis Date  . Anxiety   . Depression   . Diabetes mellitus without complication (HCC)    denies this diagnosis   . Family history of adverse reaction to anesthesia    per patient ; father was having abdominal surgery and had to be placed on medicine to keep his blood pressure up; he never had any issues with low BP before the surgery "   . GERD (gastroesophageal reflux disease)   . Gout    believes only occurred once in feet;   . Hypertension    denies this diagnosis   . Hypothyroidism   . Obesity   . Palpitations   . PCOS (polycystic ovarian syndrome)   . PONV (postoperative nausea and vomiting)    with c section; no issues with followwing procedures   . Seasonal allergies   . Seizures (HCC)    has had "pre-seizure" activity in the brain but deneis any actual seizures   . Sleep apnea    does not currently use due to insurance     Past Surgical History:  Procedure Laterality Date  . CESAREAN SECTION    . CHOLECYSTECTOMY    . ESOPHAGOGASTRODUODENOSCOPY  2001 and 2018  . GANGLION CYST EXCISION Left 2014   foot  . KNEE ARTHROSCOPY Left 06/28/2017   Procedure: LEFT KNEE ARTHROSCOPY with debridement;  Surgeon: Kathryne Hitch, MD;  Location: [facility];  Service:  Orthopedics;  Laterality: Left;    There were no vitals filed for this visit.  Subjective Assessment - 09/11/17 1037    Subjective  Pt ambulates into therapy witout assistive device, "I forgot to bring my walker with me". She is accompanied by her mom and sister who are visiting from out of state; they have been helping with child care and house work.  She has many concerns regarding return to work; she would like to but is unsure if her body will tolerate it.      Patient Stated Goals  to help increase strength and endurance     Currently in Pain?  Yes    Pain Score  6     Pain Location  Knee    Pain Orientation  Left    Pain Descriptors / Indicators  Tightness;Sore;Sharp    Aggravating Factors   walking, bending knee    Pain Relieving Factors  ice/heat         [facility] PT Assessment - 09/11/17 0001      Assessment   Medical Diagnosis  Lt knee scope    Referring Provider  Dr. Doneen Poisson     Onset Date/Surgical Date  06/28/17    Hand Dominance  Right  Next MD Visit  PRN      AROM   Left Knee Extension  0      Flexibility   Quadriceps  Lt 95 deg                  [facility] Adult PT Treatment/Exercise - 09/11/17 0001      Knee/Hip Exercises: Stretches   Passive Hamstring Stretch  Left;3 reps;60 seconds    Quad Stretch  Left;3 reps;60 seconds    ITB Stretch  Left;2 reps;30 seconds tactile cues for improved form; supine with strap      Knee/Hip Exercises: Aerobic   Nustep  L2: 6 min (slow speed) PTA present to discuss progress      Knee/Hip Exercises: Standing   Gait Training  Gait training with SPC:  slow, Step through pattern, VC to relax LLE, allow Lt foot to roll through toes during toe off and allow knee to bend during swing through; slow pace.  freq VC regarding placement of SPC and weight shift.       Knee/Hip Exercises: Seated   Long Arc Quad  Strengthening;Left;1 set;10 reps 5 sec hold in ext      Knee/Hip Exercises: Supine   Quad Sets   Strengthening;Left;1 set;5 reps      Modalities   Modalities  Cryotherapy;Electrical Stimulation;Moist Heat      Moist Heat Therapy   Number Minutes Moist Heat  15 Minutes    Moist Heat Location  Lumbar Spine hamstring       Electrical Stimulation   Electrical Stimulation Location  Lt knee (ant/post)    Electrical Stimulation Action  IFC    Electrical Stimulation Parameters  to tolerance     Electrical Stimulation Goals  Pain;Tone      Manual Therapy   Manual therapy comments  I strip of  regular [place] tape placed with 15% stretch over medial Lt knee to decrease pain, decompress tissue, increase proprioception, assist with scar management.  Additional piece placed on lateral portion of knee to assist in improved tracking.     Soft tissue mobilization  STM to Lt quad              PT Education - 09/11/17 1042    Education provided  Yes    Education Details  encouraged pt to look into pool exercise.     Person(s) Educated  Patient    Methods  Explanation    Comprehension  Verbalized understanding          PT Long Term Goals - 09/11/17 1043      PT LONG TERM GOAL #1   Title  I with advanced HEP (09/20/17)    Time  6    Period  Weeks    Status  On-going      PT LONG TERM GOAL #2   Title  Increase Lt knee and hip strength =/> 5-/5 (09/20/17)    Time  6    Period  Weeks    Status  On-going      PT LONG TERM GOAL #3   Title  Patient to report =/> 75% reduction in Lt knee pain (09/20/17)     Status  On-going      PT LONG TERM GOAL #4   Title  Patient to demonstrate normalized gait pattern on even surfaces (09/20/17)     Time  6    Period  Weeks    Status  On-going      PT LONG  TERM GOAL #5   Title  improve FOTO =/< 46% limited (09/20/17)     Time  6    Period  Weeks    Status  On-going            Plan - 09/11/17 1325    Clinical Impression Statement  Pt able to complete more exercise than last visit.  Her gait, while still antalgic, is less guarded  than last visit.  Her ROM continues to be limited since flare up a week ago.  Pt making gradual gains towards goals since flare up in symptoms.     Rehab Potential  Good    PT Frequency  2x / week    PT Duration  6 weeks    PT Treatment/Interventions  Patient/family education;ADLs/Self Care Home Management;Cryotherapy;Electrical Stimulation;Moist Heat;Iontophoresis 4mg /ml Dexamethasone;Ultrasound;Gait training;Functional mobility training;Therapeutic activities;Therapeutic exercise;Dry needling;Manual techniques    PT Next Visit Plan  ROM/ strengthening LLE.  Gait training.  Manual therapy to LLE.  MD note.     Consulted and Agree with Plan of Care  Patient       Patient will benefit from skilled therapeutic intervention in order to improve the following deficits and impairments:  Postural dysfunction, Improper body mechanics, Abnormal gait, Decreased activity tolerance, Decreased range of motion, Decreased mobility, Decreased strength  Visit Diagnosis: Chronic pain of left knee  Muscle weakness (generalized)  Abnormal posture  Other abnormalities of gait and mobility     Problem List Patient Active Problem List   Diagnosis Date Noted  . S/P arthroscopy of left knee 07/18/2017  . Acute pain of left knee 06/17/2017  . Meniscal cyst, left 06/17/2017  . Cyst of lateral meniscus of left knee 05/08/2017  . Anterior cruciate ligament sprain 05/08/2017  . Morbid obesity (HCC) 05/08/2017  . History of obstructive sleep apnea 04/09/2017  . Hypothyroidism 04/09/2017  . PCOS (polycystic ovarian syndrome) 04/09/2017  . Anxiety 04/09/2017  . Chronic pain of left knee 04/09/2017  . Gastroesophageal reflux disease with esophagitis 04/09/2017   Mayer Camel, PTA 09/11/17 1:27 PM  [facility] [place] [address] [place], [place], [address] Phone: [phone]   Fax:  [phone]  Name: Michaela Holland MRN: [id number] Date of  Birth: 29-Aug-1981

## 2017-09-12 ENCOUNTER — Encounter: Payer: Self-pay | Admitting: Family Medicine

## 2017-09-13 ENCOUNTER — Ambulatory Visit (INDEPENDENT_AMBULATORY_CARE_PROVIDER_SITE_OTHER): Payer: 59 | Admitting: Physical Therapy

## 2017-09-13 DIAGNOSIS — M25562 Pain in left knee: Secondary | ICD-10-CM | POA: Diagnosis not present

## 2017-09-13 DIAGNOSIS — R2689 Other abnormalities of gait and mobility: Secondary | ICD-10-CM

## 2017-09-13 DIAGNOSIS — M6281 Muscle weakness (generalized): Secondary | ICD-10-CM

## 2017-09-13 DIAGNOSIS — R293 Abnormal posture: Secondary | ICD-10-CM

## 2017-09-13 DIAGNOSIS — G8929 Other chronic pain: Secondary | ICD-10-CM

## 2017-09-13 NOTE — Therapy (Signed)
[facility] [place] [address] [place], [place], [address] Phone: [phone]   Fax:  [phone]  Physical Therapy Treatment  Patient Details  Name: OCIA SIMEK MRN: [id number] Date of Birth: 07/04/1981 Referring Provider: Dr. Doneen Poisson   Encounter Date: 09/13/2017  PT End of Session - 09/13/17 1454    Visit Number  7    Number of Visits  12    Date for PT Re-Evaluation  09/20/17    PT Start Time  1450    PT Stop Time  1547    PT Time Calculation (min)  57 min       Past Medical History:  Diagnosis Date  . Anxiety   . Depression   . Diabetes mellitus without complication (HCC)    denies this diagnosis   . Family history of adverse reaction to anesthesia    per patient ; father was having abdominal surgery and had to be placed on medicine to keep his blood pressure up; he never had any issues with low BP before the surgery "   . GERD (gastroesophageal reflux disease)   . Gout    believes only occurred once in feet;   . Hypertension    denies this diagnosis   . Hypothyroidism   . Obesity   . Palpitations   . PCOS (polycystic ovarian syndrome)   . PONV (postoperative nausea and vomiting)    with c section; no issues with followwing procedures   . Seasonal allergies   . Seizures (HCC)    has had "pre-seizure" activity in the brain but deneis any actual seizures   . Sleep apnea    does not currently use due to insurance     Past Surgical History:  Procedure Laterality Date  . CESAREAN SECTION    . CHOLECYSTECTOMY    . ESOPHAGOGASTRODUODENOSCOPY  2001 and 2018  . GANGLION CYST EXCISION Left 2014   foot  . KNEE ARTHROSCOPY Left 06/28/2017   Procedure: LEFT KNEE ARTHROSCOPY with debridement;  Surgeon: Kathryne Hitch, MD;  Location: [facility];  Service: Orthopedics;  Laterality: Left;    There were no vitals filed for this visit.  Subjective Assessment - 09/13/17 1454    Subjective  Pt  ambulates into therapy without assistive device. She performed all her knee exercises yesterday, including stretches and leg lifts.  "The pain is there but different."  Her hamstring is less contracted. Her knee is constant and throbby when at rest, which is new.     Currently in Pain?  Yes    Pain Score  7     Pain Location  Knee    Pain Orientation  Left    Pain Descriptors / Indicators  Throbbing;Sharp    Aggravating Factors   walking, bending knee     Pain Relieving Factors  ice/heat         [facility] PT Assessment - 09/13/17 0001      Assessment   Medical Diagnosis  Lt knee scope    Referring Provider  Dr. Doneen Poisson    Onset Date/Surgical Date  06/28/17    Hand Dominance  Right      Observation/Other Assessments   Focus on Therapeutic Outcomes (FOTO)   70% limited      AROM   Right Knee Flexion  124    Left Knee Extension  0    Left Knee Flexion  111      Strength   Left Hip Flexion  --  5-/5    Left Hip Extension  -- 5-/5    Left Hip ABduction  4+/5    Left Hip ADduction  -- 5-/5      Flexibility   Hamstrings  LLE 69 deg     Quadriceps  89 deg        [facility] Adult PT Treatment/Exercise - 09/13/17 0001      Knee/Hip Exercises: Stretches   Passive Hamstring Stretch  Left;3 reps;60 seconds    Quad Stretch  Left;3 reps;60 seconds prone with strap      Knee/Hip Exercises: Standing   Gait Training  Gait training ~200 ft with RW: VC for decreased stride length of LLE, increased knee flexion during swing through.  Improved with VC and increased distance.       Knee/Hip Exercises: Seated   Long Arc Quad  Strengthening;Left;1 set;10 reps 5 sec hold in ext      Knee/Hip Exercises: Supine   Heel Slides  Left;1 set;5 reps very challenging.     Bridges  Limitations;1 set;10 reps isometric, no lift in buttocks, 5-8 sec    Bridges with [employer]  Both;1 set;10 reps 5 sec isometric      Electrical Stimulation   Electrical Stimulation Location  Lt knee (ant/post)     Statistician Action  IFC    Electrical Stimulation Parameters  to tolerance     Electrical Stimulation Goals  Pain      Vasopneumatic   Number Minutes Vasopneumatic   15 minutes    Vasopnuematic Location   Knee Lt     Vasopneumatic Pressure  Low    Vasopneumatic Temperature   34 deg       Manual Therapy   Manual therapy comments  I strip of  regular [place] tape placed with 15% stretch over medial Lt knee to decrease pain, decompress tissue, increase proprioception, assist with scar management.  Additional piece placed on lateral portion of knee to assist in improved tracking.     Soft tissue mobilization  STM to Lt lateral hamstring and quad                   PT Long Term Goals - 09/11/17 1043      PT LONG TERM GOAL #1   Title  I with advanced HEP (09/20/17)    Time  6    Period  Weeks    Status  On-going      PT LONG TERM GOAL #2   Title  Increase Lt knee and hip strength =/> 5-/5 (09/20/17)    Time  6    Period  Weeks    Status  On-going      PT LONG TERM GOAL #3   Title  Patient to report =/> 75% reduction in Lt knee pain (09/20/17)     Status  On-going      PT LONG TERM GOAL #4   Title  Patient to demonstrate normalized gait pattern on even surfaces (09/20/17)     Time  6    Period  Weeks    Status  On-going      PT LONG TERM GOAL #5   Title  improve FOTO =/< 46% limited (09/20/17)     Time  6    Period  Weeks    Status  On-going            Plan - 09/13/17 1627    Clinical Impression Statement  Jessieca continues to be slowly recovering  from recent flare up of Lt knee symptoms.  Her Lt hip strength was strong with MMT, however functionally she demonstrates weakness in LLE with activities like gait and transfers.  Her FOTO score is reflective of recent flare up  (70% limited).  Flexibility has decreased.   Lt knee ROM limited by 10 degrees of flexion compared to RLE.  Pain has been a limiting factor to progress.  Pt encouraged to try aquatic  program for improving knee function in unweighted environment.  Pt progressing gradually towards established goals.     Rehab Potential  Good    PT Frequency  2x / week    PT Duration  6 weeks    PT Treatment/Interventions  Patient/family education;ADLs/Self Care Home Management;Cryotherapy;Electrical Stimulation;Moist Heat;Iontophoresis 4mg /ml Dexamethasone;Ultrasound;Gait training;Functional mobility training;Therapeutic activities;Therapeutic exercise;Dry needling;Manual techniques    PT Next Visit Plan  continue gait training, ROM, and strengthening of LLE.     Consulted and Agree with Plan of Care  Patient       Patient will benefit from skilled therapeutic intervention in order to improve the following deficits and impairments:  Postural dysfunction, Improper body mechanics, Abnormal gait, Decreased activity tolerance, Decreased range of motion, Decreased mobility, Decreased strength  Visit Diagnosis: Chronic pain of left knee  Muscle weakness (generalized)  Abnormal posture  Other abnormalities of gait and mobility     Problem List Patient Active Problem List   Diagnosis Date Noted  . S/P arthroscopy of left knee 07/18/2017  . Acute pain of left knee 06/17/2017  . Meniscal cyst, left 06/17/2017  . Cyst of lateral meniscus of left knee 05/08/2017  . Anterior cruciate ligament sprain 05/08/2017  . Morbid obesity (HCC) 05/08/2017  . History of obstructive sleep apnea 04/09/2017  . Hypothyroidism 04/09/2017  . PCOS (polycystic ovarian syndrome) 04/09/2017  . Anxiety 04/09/2017  . Chronic pain of left knee 04/09/2017  . Gastroesophageal reflux disease with esophagitis 04/09/2017   Mayer CamelJennifer Carlson-Long, PTA 09/13/17 4:35 PM  [facility] [place] [address] [place], [place], [address] Phone: [phone]   Fax:  [phone]  Name: Garald Baldingnne K Mcphatter MRN: [id number] Date of Birth: October 28, 1980

## 2017-09-16 ENCOUNTER — Encounter (HOSPITAL_COMMUNITY): Payer: Self-pay | Admitting: Psychiatry

## 2017-09-16 DIAGNOSIS — F331 Major depressive disorder, recurrent, moderate: Secondary | ICD-10-CM

## 2017-09-17 ENCOUNTER — Ambulatory Visit (INDEPENDENT_AMBULATORY_CARE_PROVIDER_SITE_OTHER): Payer: 59 | Admitting: Physical Therapy

## 2017-09-17 DIAGNOSIS — M6281 Muscle weakness (generalized): Secondary | ICD-10-CM

## 2017-09-17 DIAGNOSIS — G8929 Other chronic pain: Secondary | ICD-10-CM

## 2017-09-17 DIAGNOSIS — R293 Abnormal posture: Secondary | ICD-10-CM

## 2017-09-17 DIAGNOSIS — R2689 Other abnormalities of gait and mobility: Secondary | ICD-10-CM | POA: Diagnosis not present

## 2017-09-17 DIAGNOSIS — M25562 Pain in left knee: Secondary | ICD-10-CM | POA: Diagnosis not present

## 2017-09-17 NOTE — Patient Instructions (Signed)
Hip Extension    Lying face down, raise leg just off floor. Keep leg straight. Hold 1 count. Lower leg to floor. Repeat ____ times each leg. Optional: Place small pillow under abdomen.  Leg Flexion    Inhale. While exhaling, lift one ankle toward buttocks, keeping knees together. Slowly return to starting position. Repeat _10___ times each leg. Do _1-2___ sets per session. Do __1__ sessions per day.   [facility] [address] [place], [place] [address]  [phone] (office) [phone] (fax)

## 2017-09-17 NOTE — Therapy (Signed)
[facility] [address] [place], [place], [address] Phone: [phone]   Fax:  [phone]  Physical Therapy Treatment  Patient Details  Name: Michaela Baldingnne K Ragle MRN: [id number] Date of Birth: 1980/09/27 Referring Provider: Dr. Doneen Poissonhristopher Blackman   Encounter Date: 09/17/2017  PT End of Session - 09/17/17 1115    Visit Number  8    Number of Visits  12    Date for PT Re-Evaluation  09/20/17    PT Start Time  1107    PT Stop Time  1203    PT Time Calculation (min)  56 min    Activity Tolerance  Patient tolerated treatment well;No increased pain    Behavior During Therapy  WFL for tasks assessed/performed       Past Medical History:  Diagnosis Date  . Anxiety   . Depression   . Diabetes mellitus without complication (HCC)    denies this diagnosis   . Family history of adverse reaction to anesthesia    per patient ; father was having abdominal surgery and had to be placed on medicine to keep his blood pressure up; he never had any issues with low BP before the surgery "   . GERD (gastroesophageal reflux disease)   . Gout    believes only occurred once in feet;   . Hypertension    denies this diagnosis   . Hypothyroidism   . Obesity   . Palpitations   . PCOS (polycystic ovarian syndrome)   . PONV (postoperative nausea and vomiting)    with c section; no issues with followwing procedures   . Seasonal allergies   . Seizures (HCC)    has had "pre-seizure" activity in the brain but deneis any actual seizures   . Sleep apnea    does not currently use due to insurance     Past Surgical History:  Procedure Laterality Date  . CESAREAN SECTION    . CHOLECYSTECTOMY    . ESOPHAGOGASTRODUODENOSCOPY  2001 and 2018  . GANGLION CYST EXCISION Left 2014   foot  . KNEE ARTHROSCOPY Left 06/28/2017   Procedure: LEFT KNEE ARTHROSCOPY with debridement;  Surgeon: Kathryne HitchBlackman, Christopher Y, MD;  Location: [facility];  Service:  Orthopedics;  Laterality: Left;    There were no vitals filed for this visit.  Subjective Assessment - 09/17/17 1210    Subjective  Pt reports she is positive about slow improvement in Lt knee.  She is not working right now.  Awating MD visit prior to returning to work  She has looked into [employer] membership; she likes swimming and knows this will help.     Patient Stated Goals  to help increase strength and endurance     Currently in Pain?  Yes    Pain Score  5     Pain Location  Knee    Pain Orientation  Left    Pain Descriptors / Indicators  Tightness;Aching    Aggravating Factors   Lt leg weight bearing; prolonged walking    Pain Relieving Factors  ice/Heat         [facility] PT Assessment - 09/17/17 0001      Assessment   Medical Diagnosis  Lt knee scope    Referring Provider  Dr. Doneen Poissonhristopher Blackman    Onset Date/Surgical Date  06/28/17    Hand Dominance  Right    Next MD Visit  09/18/17      Flexibility   Hamstrings  83 deg LLE  [facility] Adult PT Treatment/Exercise - 09/17/17 0001      Knee/Hip Exercises: Stretches   Passive Hamstring Stretch  Right;Left;2 reps;30 seconds      Knee/Hip Exercises: Standing   Hip Abduction  Left;1 set;10 reps;Knee straight    Abduction Limitations  trial of Lt leg WB for Rt hip abduction; increased knee pain, stopped after 3 reps    Hip Extension  Stengthening;Left;1 set;10 reps;Knee straight toe pointed, UE on RW.     Gait Training  Gait training ~200 ft with RW: VC for decreased stride length of LLE, increased knee flexion during swing through, increased heel strike.  Improved with VC and increased distance.     Other Standing Knee Exercises  Lt hamstring curls x 10 reps with RW      Knee/Hip Exercises: Seated   Long Arc Quad  Strengthening;Left;1 set;10 reps;Limitations 5 sec hold in ext    [place] Limitations  2nd set, 2# at ankle.       Knee/Hip Exercises: Supine   Bridges  Limitations;1 set;10 reps isometric, no lift in  buttocks, 5-8 sec    Bridges Limitations  first rep with lifting hips, Rt hamstring cramped - stretched Rt hamstring to resolve. 4      Knee/Hip Exercises: Sidelying   Hip ABduction  Strengthening;Left;10 reps;2 sets      Knee/Hip Exercises: Prone   Straight Leg Raises  Strengthening;Right;Left;1 set;10 reps challenging; VC to not hold breath      Moist Heat Therapy   Number Minutes Moist Heat  15 Minutes    Moist Heat Location  Lumbar Spine      Electrical Stimulation   Electrical Stimulation Location  Lt knee (ant/post)    Electrical Stimulation Action  IFC    Electrical Stimulation Parameters   to tolerance    Electrical Stimulation Goals  Pain      Vasopneumatic   Number Minutes Vasopneumatic   15 minutes    Vasopnuematic Location   Knee Lt     Vasopneumatic Pressure  Low    Vasopneumatic Temperature   34 deg       Manual Therapy   Manual therapy comments  pt issued 2 strips of Rock tape and was instructed on application principles.              PT Education - 09/17/17 1125    Education provided  Yes    Education Details  HEP     Person(s) Educated  Patient    Methods  Explanation;Handout;Demonstration    Comprehension  Verbalized understanding;Returned demonstration          PT Long Term Goals - 09/11/17 1043      PT LONG TERM GOAL #1   Title  I with advanced HEP (09/20/17)    Time  6    Period  Weeks    Status  On-going      PT LONG TERM GOAL #2   Title  Increase Lt knee and hip strength =/> 5-/5 (09/20/17)    Time  6    Period  Weeks    Status  On-going      PT LONG TERM GOAL #3   Title  Patient to report =/> 75% reduction in Lt knee pain (09/20/17)     Status  On-going      PT LONG TERM GOAL #4   Title  Patient to demonstrate normalized gait pattern on even surfaces (09/20/17)     Time  6  Period  Weeks    Status  On-going      PT LONG TERM GOAL #5   Title  improve FOTO =/< 46% limited (09/20/17)     Time  6    Period  Weeks    Status   On-going            Plan - 09/17/17 1207    Clinical Impression Statement  Pt observed with improved gait pattern with RW this visit.  She was able to tolerate all exercises well, with exception of attempts to bear full weight in LLE in standing to abdct Rt hip, this was painful in Lt knee.  Pt's hamstring flexibility has improved from last visit.  Pt actively seeking pool membership this week.  Pt making gradual progress towards established goals.     Rehab Potential  Good    PT Frequency  2x / week    PT Duration  6 weeks    PT Treatment/Interventions  Patient/family education;ADLs/Self Care Home Management;Cryotherapy;Electrical Stimulation;Moist Heat;Iontophoresis 4mg /ml Dexamethasone;Ultrasound;Gait training;Functional mobility training;Therapeutic activities;Therapeutic exercise;Dry needling;Manual techniques    PT Next Visit Plan  continue gait training, ROM, and strengthening of LLE.     Consulted and Agree with Plan of Care  Patient       Patient will benefit from skilled therapeutic intervention in order to improve the following deficits and impairments:  Postural dysfunction, Improper body mechanics, Abnormal gait, Decreased activity tolerance, Decreased range of motion, Decreased mobility, Decreased strength  Visit Diagnosis: Chronic pain of left knee  Muscle weakness (generalized)  Abnormal posture  Other abnormalities of gait and mobility     Problem List Patient Active Problem List   Diagnosis Date Noted  . S/P arthroscopy of left knee 07/18/2017  . Acute pain of left knee 06/17/2017  . Meniscal cyst, left 06/17/2017  . Cyst of lateral meniscus of left knee 05/08/2017  . Anterior cruciate ligament sprain 05/08/2017  . Morbid obesity (HCC) 05/08/2017  . History of obstructive sleep apnea 04/09/2017  . Hypothyroidism 04/09/2017  . PCOS (polycystic ovarian syndrome) 04/09/2017  . Anxiety 04/09/2017  . Chronic pain of left knee 04/09/2017  . Gastroesophageal  reflux disease with esophagitis 04/09/2017   Mayer Camel, PTA 09/17/17 12:15 PM  [facility] [address] [place], [place], [address] Phone: [phone]   Fax:  [phone]  Name: Michaela Holland MRN: [id number] Date of Birth: 1981-03-15

## 2017-09-18 ENCOUNTER — Ambulatory Visit (INDEPENDENT_AMBULATORY_CARE_PROVIDER_SITE_OTHER): Payer: 59 | Admitting: Orthopaedic Surgery

## 2017-09-18 ENCOUNTER — Encounter (INDEPENDENT_AMBULATORY_CARE_PROVIDER_SITE_OTHER): Payer: Self-pay | Admitting: Orthopaedic Surgery

## 2017-09-18 DIAGNOSIS — Z9889 Other specified postprocedural states: Secondary | ICD-10-CM

## 2017-09-18 NOTE — Progress Notes (Signed)
Michaela Holland is returning for continued follow-up of her left knee.  We performed arthroscopic surgery on that knee on 06/28/2017 and found area chondromalacia of the medial femoral condyle.  The MRI and noted a lateral meniscal tear but we found the lateral meniscus to be intact.  Her knee does hyperextend and she had a significant amount of pain still with mobility and weightbearing.  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  She is back to walking with a walker and going to physical therapy trying to work on strengthening her knee as well as balance and coordination.  On examination of her left knee both knees hyperextend.  There is no effusion at all.  Her knee is tender laterally and medially.  At this point I do feel is essential we rest her knee completely in terms of being out of work and off of her knee is much as possible however I do want her to continue physical therapy to work on her balance and gait training coordination.  They are talking about potentially even aquatic therapy.  We will see her back in 4 weeks to see how she is doing overall.  I would like an AP and lateral of her left knee at that visit.

## 2017-09-19 ENCOUNTER — Encounter: Payer: Self-pay | Admitting: Physical Therapy

## 2017-09-19 MED ORDER — FLUOXETINE HCL 10 MG PO CAPS: 10.0000 mg | ORAL_CAPSULE | Freq: Every day | ORAL | 0 refills | Status: DC

## 2017-09-20 ENCOUNTER — Ambulatory Visit (INDEPENDENT_AMBULATORY_CARE_PROVIDER_SITE_OTHER): Payer: 59 | Admitting: Physical Therapy

## 2017-09-20 ENCOUNTER — Telehealth (INDEPENDENT_AMBULATORY_CARE_PROVIDER_SITE_OTHER): Payer: Self-pay | Admitting: Orthopaedic Surgery

## 2017-09-20 DIAGNOSIS — M25562 Pain in left knee: Secondary | ICD-10-CM | POA: Diagnosis not present

## 2017-09-20 DIAGNOSIS — R2689 Other abnormalities of gait and mobility: Secondary | ICD-10-CM

## 2017-09-20 DIAGNOSIS — M6281 Muscle weakness (generalized): Secondary | ICD-10-CM

## 2017-09-20 DIAGNOSIS — G8929 Other chronic pain: Secondary | ICD-10-CM | POA: Diagnosis not present

## 2017-09-20 DIAGNOSIS — R293 Abnormal posture: Secondary | ICD-10-CM | POA: Diagnosis not present

## 2017-09-20 NOTE — Telephone Encounter (Signed)
Pt called and needs progress notes DOS 09/18/17  [employer]  Fax # [phone]   Claim number  [id number]

## 2017-09-20 NOTE — Therapy (Addendum)
[facility] [address] [place], [place], [address] Phone: [phone]   Fax:  [phone]  Physical Therapy Treatment  Patient Details  Name: Michaela Holland MRN: [id number] Date of Birth: 27-Feb-1981 Referring Provider: Dr. Doneen Poisson   Encounter Date: 09/20/2017  PT End of Session - 09/20/17 1018    Visit Number  9    Number of Visits  12    Date for PT Re-Evaluation  09/20/17    PT Start Time  0935    PT Stop Time  1033    PT Time Calculation (min)  58 min       Past Medical History:  Diagnosis Date  . Anxiety   . Depression   . Diabetes mellitus without complication (HCC)    denies this diagnosis   . Family history of adverse reaction to anesthesia    per patient ; father was having abdominal surgery and had to be placed on medicine to keep his blood pressure up; he never had any issues with low BP before the surgery "   . GERD (gastroesophageal reflux disease)   . Gout    believes only occurred once in feet;   . Hypertension    denies this diagnosis   . Hypothyroidism   . Obesity   . Palpitations   . PCOS (polycystic ovarian syndrome)   . PONV (postoperative nausea and vomiting)    with c section; no issues with followwing procedures   . Seasonal allergies   . Seizures (HCC)    has had "pre-seizure" activity in the brain but deneis any actual seizures   . Sleep apnea    does not currently use due to insurance     Past Surgical History:  Procedure Laterality Date  . CESAREAN SECTION    . CHOLECYSTECTOMY    . ESOPHAGOGASTRODUODENOSCOPY  2001 and 2018  . GANGLION CYST EXCISION Left 2014   foot  . KNEE ARTHROSCOPY Left 06/28/2017   Procedure: LEFT KNEE ARTHROSCOPY with debridement;  Surgeon: Kathryne Hitch, MD;  Location: [facility];  Service: Orthopedics;  Laterality: Left;    There were no vitals filed for this visit.  Subjective Assessment - 09/20/17 0939    Subjective  Pt  visited MD; "he said I need to rest and rehab knee for another month; will re-evaluate in a month".   She got a groupon for local pool at church; will start using that today. She will be out of work for a month.     Patient Stated Goals  to help increase strength and endurance     Currently in Pain?  Yes    Pain Score  5  no pain medicine yet    Pain Location  Knee    Pain Orientation  Left    Aggravating Factors   prolonged walking    Pain Relieving Factors  ice         [facility] PT Assessment - 09/20/17 0001      Assessment   Medical Diagnosis  Lt knee scope    Referring Provider  Dr. Doneen Poisson    Onset Date/Surgical Date  06/28/17    Hand Dominance  Right    Next MD Visit  10/16/17      [facility]   Right Knee Flexion  134    Left Knee Extension  0    Left Knee Flexion  118      Strength   Left Hip Flexion  -- 5-/5,  with discomfort    Left Hip Extension  5/5    Left Hip ABduction  -- 5-/5      Flexibility   Quadriceps  93       [facility] Adult PT Treatment/Exercise - 09/20/17 0001      Knee/Hip Exercises: Stretches   Passive Hamstring Stretch  Right;Left;2 reps;30 seconds    Gastroc Stretch  Left;Right;2 reps;30 seconds    Soleus Stretch  Right;Left;2 reps;30 seconds      Knee/Hip Exercises: Aerobic   Other Aerobic  Laps around gym with RW - to warm up. 360 ft.       Knee/Hip Exercises: Standing   Heel Raises  Both;10 reps    Hip Extension  Stengthening;1 set;10 reps;Knee straight;Left;Right toe pointed, UE on RW.     Other Standing Knee Exercises  Lt/Rt hamstring curls x 10 reps with RW      Knee/Hip Exercises: Seated   Long Arc Quad  Strengthening;Left;2 sets;10 reps;Weights    Long Arc Quad Weight  3 lbs.    Sit to Sand  2 sets;5 reps;without UE support      Knee/Hip Exercises: Supine   Bridges  Strengthening;1 set;5 reps isometric hold without lifting, 10 sec hold      Moist Heat Therapy   Number Minutes Moist Heat  15 Minutes    Moist Heat Location   Lumbar Spine      Electrical Stimulation   Electrical Stimulation Location  Lt knee (ant/post)    Electrical Stimulation Action  IFC    Electrical Stimulation Parameters  to tolerance    Electrical Stimulation Goals  Pain      Vasopneumatic   Number Minutes Vasopneumatic   15 minutes    Vasopnuematic Location   Knee Lt    Vasopneumatic Pressure  Low    Vasopneumatic Temperature   34 deg       Manual Therapy   Manual therapy comments  I strip of  regular [place] tape placed with 15% stretch over medial Lt knee to decrease pain, decompress tissue, increase proprioception, assist with scar management.  Additional piece placed on lateral portion of knee to assist in improved tracking.                   PT Long Term Goals - 09/20/17 1305      PT LONG TERM GOAL #1   Title  I with advanced HEP (11/01/17)    Time  12    Period  Weeks    Status  Revised      PT LONG TERM GOAL #2   Title  Increase Lt knee and hip strength =/> 5-/5 (11/01/17)    Time  12    Period  Weeks    Status  Revised      PT LONG TERM GOAL #3   Title  Patient to report =/> 75% reduction in Lt knee pain (11/01/17)     Time  12    Period  Weeks    Status  Revised improving      PT LONG TERM GOAL #4   Title  Patient to demonstrate normalized gait pattern on even surfaces without assistive device  (11/01/17)     Time  12    Period  Weeks    Status  Revised improving; currently using RW      PT LONG TERM GOAL #5   Title  improve FOTO =/< 46% limited (11/01/17)     Time  12  Period  Weeks    Status  Revised      PT LONG TERM GOAL #6   Title  Patient to tolerate walking for 30-60 min with minimal to no increase in knee pain (11/01/17)    Time  6    Period  Weeks    Status  New            Plan - 09/20/17 1252    Clinical Impression Statement  Pt observed with improved gait pattern, improved gait speed with RW.  Pt able to tolerate increased exercise today; improved since flare up 2 wks  ago. She is making gains towards goals once again. Pt has joined a gym and will start using pool today.  Pt remains motivated to progress towards goals and will benefit from continued PT intervention to maximize functional mobility and return to work.      Rehab Potential  Good    PT Frequency  2x / week    PT Duration  6 weeks    PT Treatment/Interventions  Patient/family education;ADLs/Self Care Home Management;Cryotherapy;Electrical Stimulation;Moist Heat;Iontophoresis 4mg /ml Dexamethasone;Ultrasound;Gait training;Functional mobility training;Therapeutic activities;Therapeutic exercise;Dry needling;Manual techniques    PT Next Visit Plan  continue gait training, ROM, and strengthening of LLE.     Consulted and Agree with Plan of Care  Patient       Patient will benefit from skilled therapeutic intervention in order to improve the following deficits and impairments:  Postural dysfunction, Improper body mechanics, Abnormal gait, Decreased activity tolerance, Decreased range of motion, Decreased mobility, Decreased strength  Visit Diagnosis: Chronic pain of left knee - Plan: PT plan of care cert/re-cert  Muscle weakness (generalized) - Plan: PT plan of care cert/re-cert  Abnormal posture - Plan: PT plan of care cert/re-cert  Other abnormalities of gait and mobility - Plan: PT plan of care cert/re-cert     Problem List Patient Active Problem List   Diagnosis Date Noted  . S/P arthroscopy of left knee 07/18/2017  . Acute pain of left knee 06/17/2017  . Meniscal cyst, left 06/17/2017  . Cyst of lateral meniscus of left knee 05/08/2017  . Anterior cruciate ligament sprain 05/08/2017  . Morbid obesity (HCC) 05/08/2017  . History of obstructive sleep apnea 04/09/2017  . Hypothyroidism 04/09/2017  . PCOS (polycystic ovarian syndrome) 04/09/2017  . Anxiety 04/09/2017  . Chronic pain of left knee 04/09/2017  . Gastroesophageal reflux disease with esophagitis 04/09/2017   Mayer CamelJennifer  Carlson-Long, PTA 09/20/17 3:19 PM  Celyn P. Leonor LivHolt PT, MPH 09/20/17 3:19 PM   [facility] [address] [place], [place], [address] Phone: [phone]   Fax:  [phone]  Name: Garald Baldingnne K Runion MRN: [id number] Date of Birth: [birth year]

## 2017-09-20 NOTE — Addendum Note (Signed)
Addended by: Val RilesHOLT, Drucella Karbowski P on: 09/20/2017 03:19 PM   Modules accepted: Orders

## 2017-09-23 ENCOUNTER — Encounter: Payer: Self-pay | Admitting: Physical Therapy

## 2017-09-23 NOTE — Telephone Encounter (Signed)
Faxed to provided number  

## 2017-09-25 ENCOUNTER — Ambulatory Visit (INDEPENDENT_AMBULATORY_CARE_PROVIDER_SITE_OTHER): Payer: 59 | Admitting: Physical Therapy

## 2017-09-25 DIAGNOSIS — R293 Abnormal posture: Secondary | ICD-10-CM

## 2017-09-25 DIAGNOSIS — G8929 Other chronic pain: Secondary | ICD-10-CM | POA: Diagnosis not present

## 2017-09-25 DIAGNOSIS — R2689 Other abnormalities of gait and mobility: Secondary | ICD-10-CM | POA: Diagnosis not present

## 2017-09-25 DIAGNOSIS — M6281 Muscle weakness (generalized): Secondary | ICD-10-CM

## 2017-09-25 DIAGNOSIS — M25562 Pain in left knee: Secondary | ICD-10-CM | POA: Diagnosis not present

## 2017-09-25 NOTE — Therapy (Signed)
[facility] [address] [place], [place], [address] Phone: [phone]   Fax:  [phone]  Physical Therapy Treatment  Patient Details  Name: Michaela Holland MRN: [id number] Date of Birth: [birth year] Referring Provider: Dr. Doneen Poissonhristopher Blackman   Encounter Date: 09/25/2017  PT End of Session - 09/25/17 1114    Visit Number  10    Number of Visits  22    Date for PT Re-Evaluation  11/01/17    PT Start Time  1108    PT Stop Time  1205    PT Time Calculation (min)  57 min    Activity Tolerance  Patient tolerated treatment well    Behavior During Therapy  [facility] for tasks assessed/performed       Past Medical History:  Diagnosis Date  . Anxiety   . Depression   . Diabetes mellitus without complication (HCC)    denies this diagnosis   . Family history of adverse reaction to anesthesia    per patient ; father was having abdominal surgery and had to be placed on medicine to keep his blood pressure up; he never had any issues with low BP before the surgery "   . GERD (gastroesophageal reflux disease)   . Gout    believes only occurred once in feet;   . Hypertension    denies this diagnosis   . Hypothyroidism   . Obesity   . Palpitations   . PCOS (polycystic ovarian syndrome)   . PONV (postoperative nausea and vomiting)    with c section; no issues with followwing procedures   . Seasonal allergies   . Seizures (HCC)    has had "pre-seizure" activity in the brain but deneis any actual seizures   . Sleep apnea    does not currently use due to insurance     Past Surgical History:  Procedure Laterality Date  . CESAREAN SECTION    . CHOLECYSTECTOMY    . ESOPHAGOGASTRODUODENOSCOPY  2001 and 2018  . GANGLION CYST EXCISION Left 2014   foot  . KNEE ARTHROSCOPY Left 06/28/2017   Procedure: LEFT KNEE ARTHROSCOPY with debridement;  Surgeon: Kathryne HitchBlackman, Christopher Y, MD;  Location: [facility];  Service: Orthopedics;  Laterality:  Left;    There were no vitals filed for this visit.  Subjective Assessment - 09/25/17 1116    Subjective  Pt wasn't able to use pool due to limited pool hours, but plans to go tomorrow.  She continues to work on HEP at home. She forgot to bring her RW with her to appt.    Pertinent History  obesity; arthritis bilat knees and feet     Patient Stated Goals  to help increase strength and endurance     Currently in Pain?  Yes    Pain Score  5  no pain medicine prior to treatment    Pain Location  Knee    Pain Orientation  Left    Aggravating Factors   prolonged walking    Pain Relieving Factors  ice/ [place] LovelyVaso         [facility] PT Assessment - 09/25/17 0001      Assessment   Medical Diagnosis  Lt knee scope    Referring Provider  Dr. Doneen Poissonhristopher Blackman    Onset Date/Surgical Date  06/28/17    Hand Dominance  Right    Next MD Visit  10/16/17      AROM   Left Knee Flexion  124  [facility] Adult PT Treatment/Exercise - 09/25/17 0001      Knee/Hip Exercises: Stretches   Passive Hamstring Stretch  Left;2 reps;60 seconds    Quad Stretch  Left;2 reps;30 seconds    Gastroc Stretch  Left;2 reps;60 seconds supine with strap      Knee/Hip Exercises: Standing   Heel Raises  Both;1 set;10 reps    Forward Step Up  Left;1 set;Hand Hold: 2;5 reps slow, controlled - 3" step    Other Standing Knee Exercises  weight shifts in semi-tandem stance to encourage TKE of Lt knee x 10 reps       Knee/Hip Exercises: Seated   Sit to Sand  1 set;5 reps;without UE support Lt foot back, Rt foot forward, slow speed      Knee/Hip Exercises: Supine   Bridges  Both;1 set;5 reps      Knee/Hip Exercises: Prone   Hamstring Curl  1 set;5 reps 2#    Straight Leg Raises  Strengthening;Left;1 set;10 reps    Other Prone Exercises  opp arm/leg x 10 reps each side with core engaged.       Programme researcher, broadcasting/film/video Location  Lt knee (ant/post)    Engineer, manufacturing  IFC    Electrical  Stimulation Parameters  to tolerance     Electrical Stimulation Goals  Pain      Vasopneumatic   Number Minutes Vasopneumatic   15 minutes    Vasopnuematic Location   Knee Lt    Vasopneumatic Pressure  Low    Vasopneumatic Temperature   34 deg              PT Education - 09/25/17 1218    Education provided  Yes    Education Details  HEP- pool exercise handout, and prone opp arm/leg    Person(s) Educated  Patient    Methods  Explanation;Handout;Demonstration;Tactile cues;Verbal cues    Comprehension  Verbalized understanding;Returned demonstration          PT Long Term Goals - 09/20/17 1305      PT LONG TERM GOAL #1   Title  I with advanced HEP (11/01/17)    Time  12    Period  Weeks    Status  Revised      PT LONG TERM GOAL #2   Title  Increase Lt knee and hip strength =/> 5-/5 (11/01/17)    Time  12    Period  Weeks    Status  Revised      PT LONG TERM GOAL #3   Title  Patient to report =/> 75% reduction in Lt knee pain (11/01/17)     Time  12    Period  Weeks    Status  Revised improving      PT LONG TERM GOAL #4   Title  Patient to demonstrate normalized gait pattern on even surfaces without assistive device  (11/01/17)     Time  12    Period  Weeks    Status  Revised improving; currently using RW      PT LONG TERM GOAL #5   Title  improve FOTO =/< 46% limited (11/01/17)     Time  12    Period  Weeks    Status  Revised      PT LONG TERM GOAL #6   Title  Patient to tolerate walking for 30-60 min with minimal to no increase in knee pain (11/01/17)    Time  6  Period  Weeks    Status  New            Plan - 09/25/17 1213    Clinical Impression Statement  Pt ambulated without RW today; observed slight gait deviations (including delayed toe off LLE).  Her Lt knee ROM has improved today.  She tolerated exercise well including standing LE strengthening, with minimal increase in pain. Limited number of repetitions of standing exercises to avoid flare  up. Pt's HEP will be mostly pool based exercise. Pt progressing towards established goals.     Rehab Potential  Good    PT Frequency  2x / week    PT Duration  6 weeks    PT Treatment/Interventions  Patient/family education;ADLs/Self Care Home Management;Cryotherapy;Electrical Stimulation;Moist Heat;Iontophoresis 4mg /ml Dexamethasone;Ultrasound;Gait training;Functional mobility training;Therapeutic activities;Therapeutic exercise;Dry needling;Manual techniques    PT Next Visit Plan  continue gait training, ROM, and strengthening of LLE.     Consulted and Agree with Plan of Care  Patient       Patient will benefit from skilled therapeutic intervention in order to improve the following deficits and impairments:  Postural dysfunction, Improper body mechanics, Abnormal gait, Decreased activity tolerance, Decreased range of motion, Decreased mobility, Decreased strength  Visit Diagnosis: Chronic pain of left knee  Muscle weakness (generalized)  Abnormal posture  Other abnormalities of gait and mobility     Problem List Patient Active Problem List   Diagnosis Date Noted  . S/P arthroscopy of left knee 07/18/2017  . Acute pain of left knee 06/17/2017  . Meniscal cyst, left 06/17/2017  . Cyst of lateral meniscus of left knee 05/08/2017  . Anterior cruciate ligament sprain 05/08/2017  . Morbid obesity (HCC) 05/08/2017  . History of obstructive sleep apnea 04/09/2017  . Hypothyroidism 04/09/2017  . PCOS (polycystic ovarian syndrome) 04/09/2017  . Anxiety 04/09/2017  . Chronic pain of left knee 04/09/2017  . Gastroesophageal reflux disease with esophagitis 04/09/2017   Mayer Camel, PTA 09/25/17 12:22 PM  [facility] [place] [address] [place], [place], [address] Phone: [phone]   Fax:  [phone]  Name: Michaela Holland MRN: [id number] Date of Birth: Dec 18, 1980

## 2017-09-27 ENCOUNTER — Ambulatory Visit (INDEPENDENT_AMBULATORY_CARE_PROVIDER_SITE_OTHER): Payer: 59 | Admitting: Physical Therapy

## 2017-09-27 DIAGNOSIS — G8929 Other chronic pain: Secondary | ICD-10-CM | POA: Diagnosis not present

## 2017-09-27 DIAGNOSIS — M6281 Muscle weakness (generalized): Secondary | ICD-10-CM | POA: Diagnosis not present

## 2017-09-27 DIAGNOSIS — M25562 Pain in left knee: Secondary | ICD-10-CM

## 2017-09-27 NOTE — Therapy (Signed)
[facility] [address] [place], [place], [address] Phone: [phone]   Fax:  [phone]  Physical Therapy Treatment  Patient Details  Name: Michaela Holland MRN: [id number] Date of Birth: 1981-02-02 Referring Provider: Dr. Doneen Poissonchristopher Blackman   Encounter Date: 09/27/2017  PT End of Session - 09/27/17 1115    Visit Number  11    Number of Visits  22    Date for PT Re-Evaluation  11/01/17    PT Start Time  1106    PT Stop Time  1205    PT Time Calculation (min)  59 min       Past Medical History:  Diagnosis Date  . Anxiety   . Depression   . Diabetes mellitus without complication (HCC)    denies this diagnosis   . Family history of adverse reaction to anesthesia    per patient ; father was having abdominal surgery and had to be placed on medicine to keep his blood pressure up; he never had any issues with low BP before the surgery "   . GERD (gastroesophageal reflux disease)   . Gout    believes only occurred once in feet;   . Hypertension    denies this diagnosis   . Hypothyroidism   . Obesity   . Palpitations   . PCOS (polycystic ovarian syndrome)   . PONV (postoperative nausea and vomiting)    with c section; no issues with followwing procedures   . Seasonal allergies   . Seizures (HCC)    has had "pre-seizure" activity in the brain but deneis any actual seizures   . Sleep apnea    does not currently use due to insurance     Past Surgical History:  Procedure Laterality Date  . CESAREAN SECTION    . CHOLECYSTECTOMY    . ESOPHAGOGASTRODUODENOSCOPY  2001 and 2018  . GANGLION CYST EXCISION Left 2014   foot  . KNEE ARTHROSCOPY Left 06/28/2017   Procedure: LEFT KNEE ARTHROSCOPY with debridement;  Surgeon: Kathryne HitchBlackman, Christopher Y, MD;  Location: [facility];  Service: Orthopedics;  Laterality: Left;    There were no vitals filed for this visit.  Subjective Assessment - 09/27/17 1116    Subjective  Pt went  to pool yesterday and "worked hard for 45 min."  She has not been using her RW.      Pertinent History  obesity; arthritis bilat knees and feet     Patient Stated Goals  to help increase strength and endurance     Currently in Pain?  Yes    Pain Score  4     Pain Location  Knee    Pain Orientation  Left    Pain Descriptors / Indicators  Aching    Aggravating Factors   prolonged walking     Pain Relieving Factors  ice/vaso         [facility] - 09/27/17 0001      Assessment   Medical Diagnosis  Lt knee scope    Referring Provider  Dr. Doneen Poissonchristopher Blackman    Onset Date/Surgical Date  06/28/17    Hand Dominance  Right    Next MD Visit  10/16/17        [facility] Treatment/Exercise - 09/27/17 0001      Knee/Hip Exercises: Stretches   Passive Hamstring Stretch  Left;2 reps;60 seconds    Quad Stretch  Left;2 reps;30 seconds    Gastroc Stretch  Left;2 reps;60 seconds  supine with strap      Knee/Hip Exercises: Standing   Forward Step Up  Left;1 set;Hand Hold: 2;5 reps slow, controlled - 3" step    SLS  mulitple attempts for Lt SLS with light to no UE support - able to tolerate 2-3 seconds.      Other Standing Knee Exercises  Rt toe taps to 13" step x 8 reps with light UE support - Lt knee       Knee/Hip Exercises: Seated   Long Arc Quad  Strengthening;Left;2 sets;10 reps;Weights    Long Arc Quad Weight  4 lbs.    Sit to Sand  1 set;5 reps;without UE support Lt foot back, Rt foot forward, slow speed      Knee/Hip Exercises: Supine   Bridges  Both;5 reps;2 sets able to lift hips without increased knee/back pain.       Knee/Hip Exercises: Sidelying   Hip ABduction  Strengthening;Left;1 set;10 reps      Knee/Hip Exercises: Prone   Hamstring Curl  1 set;15 reps 4#    Other Prone Exercises  opp arm/leg x 10 reps each side with core engaged.       Moist Heat Therapy   Number Minutes Moist Heat  15 Minutes    Moist Heat Location  Lumbar Spine      Electrical  Stimulation   Electrical Stimulation Location  Lt knee (ant/post)    Electrical Stimulation Action  IFC    Electrical Stimulation Parameters  to tolerance     Electrical Stimulation Goals  Pain      Vasopneumatic   Number Minutes Vasopneumatic   15 minutes    Vasopnuematic Location   Knee Lt    Vasopneumatic Pressure  Low    Vasopneumatic Temperature   34 deg       Manual Therapy   Manual therapy comments  pt issued strips of Rock tape to apply to knee after pool.              PT Education - 09/27/17 1138    Education provided  Yes    Education Details  reissued HEP from last session (HEP2go)    Person(s) Educated  Patient    Methods  Explanation;Handout    Comprehension  Verbalized understanding          PT Long Term Goals - 09/20/17 1305      PT LONG TERM GOAL #1   Title  I with advanced HEP (11/01/17)    Time  12    Period  Weeks    Status  Revised      PT LONG TERM GOAL #2   Title  Increase Lt knee and hip strength =/> 5-/5 (11/01/17)    Time  12    Period  Weeks    Status  Revised      PT LONG TERM GOAL #3   Title  Patient to report =/> 75% reduction in Lt knee pain (11/01/17)     Time  12    Period  Weeks    Status  Revised improving      PT LONG TERM GOAL #4   Title  Patient to demonstrate normalized gait pattern on even surfaces without assistive device  (11/01/17)     Time  12    Period  Weeks    Status  Revised improving; currently using RW      PT LONG TERM GOAL #5   Title  improve FOTO =/< 46% limited (11/01/17)  Time  12    Period  Weeks    Status  Revised      PT LONG TERM GOAL #6   Title  Patient to tolerate walking for 30-60 min with minimal to no increase in knee pain (11/01/17)    Time  6    Period  Weeks    Status  New            Plan - 09/27/17 1139    Clinical Impression Statement  Pt had limited standing exercise tolerance today.   She was able to tolerate increased resistance with seated and supine exercise.  Gait  observed with deviations and antalgic; encouraged pt to continue using RW in community with good gait pattern.  Pt progressing gradually towards goals.     Rehab Potential  Good    PT Frequency  2x / week    PT Duration  6 weeks    PT Treatment/Interventions  Patient/family education;ADLs/Self Care Home Management;Cryotherapy;Electrical Stimulation;Moist Heat;Iontophoresis 4mg /ml Dexamethasone;Ultrasound;Gait training;Functional mobility training;Therapeutic activities;Therapeutic exercise;Dry needling;Manual techniques    PT Next Visit Plan  continue ROM, and strengthening of LLE.     Consulted and Agree with Plan of Care  Patient       Patient will benefit from skilled therapeutic intervention in order to improve the following deficits and impairments:  Postural dysfunction, Improper body mechanics, Abnormal gait, Decreased activity tolerance, Decreased range of motion, Decreased mobility, Decreased strength  Visit Diagnosis: Chronic pain of left knee  Muscle weakness (generalized)     Problem List Patient Active Problem List   Diagnosis Date Noted  . S/P arthroscopy of left knee 07/18/2017  . Acute pain of left knee 06/17/2017  . Meniscal cyst, left 06/17/2017  . Cyst of lateral meniscus of left knee 05/08/2017  . Anterior cruciate ligament sprain 05/08/2017  . Morbid obesity (HCC) 05/08/2017  . History of obstructive sleep apnea 04/09/2017  . Hypothyroidism 04/09/2017  . PCOS (polycystic ovarian syndrome) 04/09/2017  . Anxiety 04/09/2017  . Chronic pain of left knee 04/09/2017  . Gastroesophageal reflux disease with esophagitis 04/09/2017   Michaela Holland, Michaela Holland 09/27/17 12:48 PM  [facility] [place] [address] [place], [place], [address] Phone: [phone]   Fax:  [phone]  Name: Michaela Holland MRN: [id number] Date of Birth: 21-Nov-1980

## 2017-09-30 ENCOUNTER — Encounter: Payer: Self-pay | Admitting: Family Medicine

## 2017-10-01 ENCOUNTER — Ambulatory Visit (INDEPENDENT_AMBULATORY_CARE_PROVIDER_SITE_OTHER): Payer: 59 | Admitting: Physical Therapy

## 2017-10-01 ENCOUNTER — Telehealth: Payer: Self-pay

## 2017-10-01 DIAGNOSIS — G8929 Other chronic pain: Secondary | ICD-10-CM | POA: Diagnosis not present

## 2017-10-01 DIAGNOSIS — R2689 Other abnormalities of gait and mobility: Secondary | ICD-10-CM

## 2017-10-01 DIAGNOSIS — M25562 Pain in left knee: Secondary | ICD-10-CM | POA: Diagnosis not present

## 2017-10-01 DIAGNOSIS — M6281 Muscle weakness (generalized): Secondary | ICD-10-CM

## 2017-10-01 DIAGNOSIS — R293 Abnormal posture: Secondary | ICD-10-CM | POA: Diagnosis not present

## 2017-10-01 NOTE — Telephone Encounter (Signed)
Michaela Holland I believe this is for you and it was sent to me

## 2017-10-01 NOTE — Telephone Encounter (Signed)
[employer] Rep Lissa HoardSonia called stating that correspondence of date of treatment does not coincide with medical records sent. Requesting a call back to [phone] for additional information & for pre-existing review.

## 2017-10-01 NOTE — Therapy (Signed)
[facility] [place] [address] [place], [place], [address] Phone: [phone]   Fax:  [phone]  Physical Therapy Treatment  Patient Details  Name: Michaela Holland MRN: [id number] Date of Birth: 1981/03/26 Referring Provider: Dr. Doneen Poisson   Encounter Date: 10/01/2017  PT End of Session - 10/01/17 1015    Visit Number  12    Number of Visits  22    Date for PT Re-Evaluation  11/01/17    PT Start Time  0931    PT Stop Time  1029    PT Time Calculation (min)  58 min    Activity Tolerance  Patient tolerated treatment well    Behavior During Therapy  [facility] for tasks assessed/performed       Past Medical History:  Diagnosis Date  . Anxiety   . Depression   . Diabetes mellitus without complication (HCC)    denies this diagnosis   . Family history of adverse reaction to anesthesia    per patient ; father was having abdominal surgery and had to be placed on medicine to keep his blood pressure up; he never had any issues with low BP before the surgery "   . GERD (gastroesophageal reflux disease)   . Gout    believes only occurred once in feet;   . Hypertension    denies this diagnosis   . Hypothyroidism   . Obesity   . Palpitations   . PCOS (polycystic ovarian syndrome)   . PONV (postoperative nausea and vomiting)    with c section; no issues with followwing procedures   . Seasonal allergies   . Seizures (HCC)    has had "pre-seizure" activity in the brain but deneis any actual seizures   . Sleep apnea    does not currently use due to insurance     Past Surgical History:  Procedure Laterality Date  . CESAREAN SECTION    . CHOLECYSTECTOMY    . ESOPHAGOGASTRODUODENOSCOPY  2001 and 2018  . GANGLION CYST EXCISION Left 2014   foot  . KNEE ARTHROSCOPY Left 06/28/2017   Procedure: LEFT KNEE ARTHROSCOPY with debridement;  Surgeon: Kathryne Hitch, MD;  Location: [facility];  Service: Orthopedics;  Laterality:  Left;    There were no vitals filed for this visit.  Subjective Assessment - 10/01/17 0937    Subjective  Pt reports that the day of last session "was painful", but the next day it was better.  She went to pool following day; scheduling pool time has been tricky.  She states her knee feels similar to last visit. She reports she is no longer waking with as much pain.    Patient Stated Goals  to help increase strength and endurance     Currently in Pain?  Yes    Pain Score  3     Pain Location  Knee    Pain Orientation  Left    Pain Descriptors / Indicators  [facility] PT Assessment - 10/01/17 0001      Assessment   Medical Diagnosis  Lt knee scope    Referring Provider  Dr. Doneen Poisson    Onset Date/Surgical Date  06/28/17    Hand Dominance  Right    Next MD Visit  10/16/17      AROM   Left Knee Extension  0    Left Knee Flexion  128      Strength  Left Knee Flexion  -- 5-/5      Flexibility   Hamstrings  88 deg LLE    Quadriceps  105 LLE       [facility] Adult PT Treatment/Exercise - 10/01/17 0001      Knee/Hip Exercises: Stretches   Passive Hamstring Stretch  Left;3 reps;30 seconds    Quad Stretch  Left;3 reps;30 seconds    Gastroc Stretch  Left;2 reps;60 seconds supine with strap      Knee/Hip Exercises: Aerobic   Other Aerobic  Laps around gym with RW - to warm up. 480 ft. then 160 ft at end of session before modalities (without RW)      Knee/Hip Exercises: Standing   Forward Step Up  Left;1 set;Hand Hold: 2;10 reps;Limitations slow, controlled - 3" step    Forward Step Up Limitations  Rock tape applied to lateral Lt knee at rep 6     Stairs  reciprocal pattern up/down 9 steps (3" with BUE light support)     Other Standing Knee Exercises  semi-tandam stance with horiz head turns x 20 sec each side, 2 sets      Knee/Hip Exercises: Seated   Sit to Sand  1 set;10 reps      Knee/Hip Exercises: Supine   Heel Slides  Left 2 reps for measurement       Knee/Hip Exercises: Prone   Hamstring Curl  1 set;15 reps 5#    Other Prone Exercises  opp arm/leg x 10 reps each side with core engaged.     Other Prone Exercises  quad/glute set x 5 sec x 10 reps      Moist Heat Therapy   Number Minutes Moist Heat  15 Minutes    Moist Heat Location  Lumbar Spine      Electrical Stimulation   Electrical Stimulation Location  Lt knee (ant/post)    Electrical Stimulation Action  IFC    Electrical Stimulation Parameters  to tolerance     Electrical Stimulation Goals  Pain      Vasopneumatic   Number Minutes Vasopneumatic   15 minutes    Vasopnuematic Location   Knee Lt    Vasopneumatic Pressure  Low    Vasopneumatic Temperature   34 deg                   PT Long Term Goals - 09/20/17 1305      PT LONG TERM GOAL #1   Title  I with advanced HEP (11/01/17)    Time  12    Period  Weeks    Status  Revised      PT LONG TERM GOAL #2   Title  Increase Lt knee and hip strength =/> 5-/5 (11/01/17)    Time  12    Period  Weeks    Status  Revised      PT LONG TERM GOAL #3   Title  Patient to report =/> 75% reduction in Lt knee pain (11/01/17)     Time  12    Period  Weeks    Status  Revised improving      PT LONG TERM GOAL #4   Title  Patient to demonstrate normalized gait pattern on even surfaces without assistive device  (11/01/17)     Time  12    Period  Weeks    Status  Revised improving; currently using RW      PT LONG TERM GOAL #5   Title  improve  FOTO =/< 46% limited (11/01/17)     Time  12    Period  Weeks    Status  Revised      PT LONG TERM GOAL #6   Title  Patient to tolerate walking for 30-60 min with minimal to no increase in knee pain (11/01/17)    Time  6    Period  Weeks    Status  New            Plan - 10/01/17 4782    Clinical Impression Statement  Pt reported pain increase by 1-3 points during WB exercise today. She was able to complete 10 forward step ups on 3" step today without any buckling.  Her Lt  knee ROM and strength has improved.  Pt making steady progress towards goals.     Rehab Potential  Good    PT Frequency  2x / week    PT Duration  6 weeks    PT Treatment/Interventions  Patient/family education;ADLs/Self Care Home Management;Cryotherapy;Electrical Stimulation;Moist Heat;Iontophoresis 4mg /ml Dexamethasone;Ultrasound;Gait training;Functional mobility training;Therapeutic activities;Therapeutic exercise;Dry needling;Manual techniques    PT Next Visit Plan  continue ROM, and strengthening of LLE.     Consulted and Agree with Plan of Care  Patient       Patient will benefit from skilled therapeutic intervention in order to improve the following deficits and impairments:  Postural dysfunction, Improper body mechanics, Abnormal gait, Decreased activity tolerance, Decreased range of motion, Decreased mobility, Decreased strength  Visit Diagnosis: Chronic pain of left knee  Muscle weakness (generalized)  Abnormal posture  Other abnormalities of gait and mobility     Problem List Patient Active Problem List   Diagnosis Date Noted  . S/P arthroscopy of left knee 07/18/2017  . Acute pain of left knee 06/17/2017  . Meniscal cyst, left 06/17/2017  . Cyst of lateral meniscus of left knee 05/08/2017  . Anterior cruciate ligament sprain 05/08/2017  . Morbid obesity (HCC) 05/08/2017  . History of obstructive sleep apnea 04/09/2017  . Hypothyroidism 04/09/2017  . PCOS (polycystic ovarian syndrome) 04/09/2017  . Anxiety 04/09/2017  . Chronic pain of left knee 04/09/2017  . Gastroesophageal reflux disease with esophagitis 04/09/2017   Mayer Camel, PTA 10/01/17 10:18 AM  [facility] [place] [address] [place], [place], [address] Phone: [phone]   Fax:  [phone]  Name: KAIESHA TONNER MRN: [id number] Date of Birth: 1981-07-26

## 2017-10-01 NOTE — Telephone Encounter (Signed)
Yes. [employer] Rep Lissa HoardSonia called this afternoon stating that more information / clarification was required for a pre- existing review. No other details were given.

## 2017-10-02 NOTE — Telephone Encounter (Signed)
Ok, noted. Thank you.

## 2017-10-03 NOTE — Telephone Encounter (Signed)
[employer], spoke with Minerva AreolaEric, to figure out what this message was regarding. Apparently there is an ongoing disability claim being submitted and processed. I see where our office has completed two forms already regarding this, but there is another to be completed. They are faxing that over now for Provider to complete. Once done, it should be faxed back.   They also sent this same form to the Ortho office since some of the diagnosis used overlap.

## 2017-10-03 NOTE — Telephone Encounter (Signed)
Noted, will work on this once I see it in my basket!

## 2017-10-04 ENCOUNTER — Ambulatory Visit: Payer: Self-pay | Admitting: Osteopathic Medicine

## 2017-10-04 ENCOUNTER — Encounter: Payer: Self-pay | Admitting: Physical Therapy

## 2017-10-07 ENCOUNTER — Encounter (HOSPITAL_COMMUNITY): Payer: Self-pay | Admitting: Psychiatry

## 2017-10-08 MED ORDER — CITALOPRAM HYDROBROMIDE 10 MG PO TABS: 10.0000 mg | ORAL_TABLET | Freq: Every day | ORAL | 2 refills | Status: DC

## 2017-10-09 ENCOUNTER — Ambulatory Visit (INDEPENDENT_AMBULATORY_CARE_PROVIDER_SITE_OTHER): Payer: 59 | Admitting: Physical Therapy

## 2017-10-09 DIAGNOSIS — G8929 Other chronic pain: Secondary | ICD-10-CM

## 2017-10-09 DIAGNOSIS — M25562 Pain in left knee: Secondary | ICD-10-CM

## 2017-10-09 DIAGNOSIS — M6281 Muscle weakness (generalized): Secondary | ICD-10-CM | POA: Diagnosis not present

## 2017-10-09 DIAGNOSIS — R293 Abnormal posture: Secondary | ICD-10-CM

## 2017-10-09 NOTE — Therapy (Signed)
[facility] [place] [address] [place], [place], [address] Phone: [phone]   Fax:  [phone]  Physical Therapy Treatment  Patient Details  Name: Michaela Holland MRN: [id number] Date of Birth: 10/26/1980 Referring Provider: Dr. Doneen Poisson   Encounter Date: 10/09/2017  PT End of Session - 10/09/17 1440    Visit Number  13    PT Start Time  1402    PT Stop Time  1451    PT Time Calculation (min)  49 min       Past Medical History:  Diagnosis Date  . Anxiety   . Depression   . Diabetes mellitus without complication (HCC)    denies this diagnosis   . Family history of adverse reaction to anesthesia    per patient ; father was having abdominal surgery and had to be placed on medicine to keep his blood pressure up; he never had any issues with low BP before the surgery "   . GERD (gastroesophageal reflux disease)   . Gout    believes only occurred once in feet;   . Hypertension    denies this diagnosis   . Hypothyroidism   . Obesity   . Palpitations   . PCOS (polycystic ovarian syndrome)   . PONV (postoperative nausea and vomiting)    with c section; no issues with followwing procedures   . Seasonal allergies   . Seizures (HCC)    has had "pre-seizure" activity in the brain but deneis any actual seizures   . Sleep apnea    does not currently use due to insurance     Past Surgical History:  Procedure Laterality Date  . CESAREAN SECTION    . CHOLECYSTECTOMY    . ESOPHAGOGASTRODUODENOSCOPY  2001 and 2018  . GANGLION CYST EXCISION Left 2014   foot  . KNEE ARTHROSCOPY Left 06/28/2017   Procedure: LEFT KNEE ARTHROSCOPY with debridement;  Surgeon: Kathryne Hitch, MD;  Location: [facility];  Service: Orthopedics;  Laterality: Left;    There were no vitals filed for this visit.  Subjective Assessment - 10/09/17 1410    Subjective  Pt reports she overdid it at pool on Saturday.  She did 55 min (10 more  min than usual) to make up for missing Friday's session. She was very discouraged at the backslide.     Currently in Pain?  Yes    Pain Score  5     Pain Location  Knee    Pain Orientation  Left    Pain Descriptors / Indicators  Dull    Aggravating Factors   prolonged walking     Pain Relieving Factors  ice/vaso         [facility] PT Assessment - 10/09/17 0001      AROM   Left Knee Extension  0    Left Knee Flexion  128      Flexibility   Hamstrings  88 deg LLE    Quadriceps  109 deg       [facility] Adult PT Treatment/Exercise - 10/09/17 0001      Knee/Hip Exercises: Stretches   Passive Hamstring Stretch  Left;3 reps;30 seconds    Other Knee/Hip Stretches  Lt adductor stretch with strap, 2 reps of 20 sec;Lt  quad stretch 30 sec (prone with strap) x 3 reps     Knee/Hip Exercises: Standing   Heel Raises  Both;2 sets;10 reps hands on RW, VC to not hyperext knee    Functional  Squat  1 set;5 reps core engaged, mini-squat, VC to breathe    Other Standing Knee Exercises  semi-tandam stance on blue pads with horiz head turns x 20 sec each side, 2 sets,       Knee/Hip Exercises: Seated   Sit to Sand  1 set;5 reps;without UE support Lt foot back, Rt foot forward, slow speed      Knee/Hip Exercises: Supine   Bridges  Both;10 reps;1 set able to lift hips without increased knee/back pain.  Core engaged.     Cryotherapy   Number Minutes Cryotherapy  15 Minutes    Cryotherapy Location  Knee Lt    Type of Cryotherapy  Ice pack      Electrical Stimulation   Electrical Stimulation Location  Lt knee (ant/post)    Electrical Stimulation Action  IFC    Electrical Stimulation Parameters  to tolerance     Electrical Stimulation Goals  Pain      Vasopneumatic   Number Minutes Vasopneumatic   15 minutes    Vasopnuematic Location   Knee Lt    Vasopneumatic Pressure  Low    Vasopneumatic Temperature   34 deg                   PT Long Term Goals - 09/20/17 1305      PT LONG TERM  GOAL #1   Title  I with advanced HEP (11/01/17)    Time  12    Period  Weeks    Status  Revised      PT LONG TERM GOAL #2   Title  Increase Lt knee and hip strength =/> 5-/5 (11/01/17)    Time  12    Period  Weeks    Status  Revised      PT LONG TERM GOAL #3   Title  Patient to report =/> 75% reduction in Lt knee pain (11/01/17)     Time  12    Period  Weeks    Status  Revised improving      PT LONG TERM GOAL #4   Title  Patient to demonstrate normalized gait pattern on even surfaces without assistive device  (11/01/17)     Time  12    Period  Weeks    Status  Revised improving; currently using RW      PT LONG TERM GOAL #5   Title  improve FOTO =/< 46% limited (11/01/17)     Time  12    Period  Weeks    Status  Revised      PT LONG TERM GOAL #6   Title  Patient to tolerate walking for 30-60 min with minimal to no increase in knee pain (11/01/17)    Time  6    Period  Weeks    Status  New            Plan - 10/09/17 1437    Clinical Impression Statement  Pt able to tolerate some standing exercise with pain increase up to 5/10. Reduced with seated rest and ice at end of session.  Her ROM and flexibility continue to improve despite her reported "set back".  Her participation in pool exercise has positively impacted her with improved tolerance to weightbearing exercises in therapy. Progressing gradually toward established goals.     Rehab Potential  Good    PT Frequency  2x / week    PT Duration  6 weeks    PT Treatment/Interventions  Patient/family education;ADLs/Self Care Home Management;Cryotherapy;Electrical Stimulation;Moist Heat;Iontophoresis 4mg /ml Dexamethasone;Ultrasound;Gait training;Functional mobility training;Therapeutic activities;Therapeutic exercise;Dry needling;Manual techniques    PT Next Visit Plan  continue ROM, and strengthening of LLE.     Consulted and Agree with Plan of Care  Patient       Patient will benefit from skilled therapeutic intervention in  order to improve the following deficits and impairments:  Prosthetic Dependency  Visit Diagnosis: Chronic pain of left knee  Muscle weakness (generalized)  Abnormal posture     Problem List Patient Active Problem List   Diagnosis Date Noted  . S/P arthroscopy of left knee 07/18/2017  . Acute pain of left knee 06/17/2017  . Meniscal cyst, left 06/17/2017  . Cyst of lateral meniscus of left knee 05/08/2017  . Anterior cruciate ligament sprain 05/08/2017  . Morbid obesity (HCC) 05/08/2017  . History of obstructive sleep apnea 04/09/2017  . Hypothyroidism 04/09/2017  . PCOS (polycystic ovarian syndrome) 04/09/2017  . Anxiety 04/09/2017  . Chronic pain of left knee 04/09/2017  . Gastroesophageal reflux disease with esophagitis 04/09/2017   Mayer Camel, PTA 10/09/17 5:19 PM  [facility] [place] [address] [place], [place], [address] Phone: [phone]   Fax:  [phone]  Name: Michaela Holland MRN: [id number] Date of Birth: [birth year]

## 2017-10-14 ENCOUNTER — Ambulatory Visit (INDEPENDENT_AMBULATORY_CARE_PROVIDER_SITE_OTHER): Payer: 59 | Admitting: Physical Therapy

## 2017-10-14 DIAGNOSIS — G8929 Other chronic pain: Secondary | ICD-10-CM

## 2017-10-14 DIAGNOSIS — M6281 Muscle weakness (generalized): Secondary | ICD-10-CM

## 2017-10-14 DIAGNOSIS — R2689 Other abnormalities of gait and mobility: Secondary | ICD-10-CM

## 2017-10-14 DIAGNOSIS — M25562 Pain in left knee: Secondary | ICD-10-CM | POA: Diagnosis not present

## 2017-10-14 DIAGNOSIS — R293 Abnormal posture: Secondary | ICD-10-CM

## 2017-10-14 NOTE — Therapy (Signed)
[facility] [place] [address] [place], [place], [address] Phone: [phone]   Fax:  [phone]  Physical Therapy Treatment  Patient Details  Name: Michaela Holland MRN: [id number] Date of Birth: 06-Jan-1981 Referring Provider: Dr. Doneen Poisson   Encounter Date: 10/14/2017  PT End of Session - 10/14/17 1103    Visit Number  14    Number of Visits  22    Date for PT Re-Evaluation  11/01/17    PT Start Time  1020    PT Stop Time  1120    PT Time Calculation (min)  60 min    Activity Tolerance  Patient tolerated treatment well    Behavior During Therapy  [facility] for tasks assessed/performed       Past Medical History:  Diagnosis Date  . Anxiety   . Depression   . Diabetes mellitus without complication (HCC)    denies this diagnosis   . Family history of adverse reaction to anesthesia    per patient ; father was having abdominal surgery and had to be placed on medicine to keep his blood pressure up; he never had any issues with low BP before the surgery "   . GERD (gastroesophageal reflux disease)   . Gout    believes only occurred once in feet;   . Hypertension    denies this diagnosis   . Hypothyroidism   . Obesity   . Palpitations   . PCOS (polycystic ovarian syndrome)   . PONV (postoperative nausea and vomiting)    with c section; no issues with followwing procedures   . Seasonal allergies   . Seizures (HCC)    has had "pre-seizure" activity in the brain but deneis any actual seizures   . Sleep apnea    does not currently use due to insurance     Past Surgical History:  Procedure Laterality Date  . CESAREAN SECTION    . CHOLECYSTECTOMY    . ESOPHAGOGASTRODUODENOSCOPY  2001 and 2018  . GANGLION CYST EXCISION Left 2014   foot  . KNEE ARTHROSCOPY Left 06/28/2017   Procedure: LEFT KNEE ARTHROSCOPY with debridement;  Surgeon: Kathryne Hitch, MD;  Location: [facility];  Service: Orthopedics;  Laterality:  Left;    There were no vitals filed for this visit.  Subjective Assessment - 10/14/17 1035    Subjective  Pt reports she is using her RW in community and occasionally in the home.  She notes she can stand/walk 15-30 min before pain increases in Lt knee.      Pertinent History  obesity; arthritis bilat knees and feet     Currently in Pain?  Yes    Pain Score  4     Pain Location  Knee    Pain Orientation  Left    Pain Descriptors / Indicators  Aching    Aggravating Factors   prolonged walking    Pain Relieving Factors  ice, vaso         [facility] PT Assessment - 10/14/17 0001      Assessment   Medical Diagnosis  Lt knee scope    Referring Provider  Dr. Doneen Poisson    Onset Date/Surgical Date  06/28/17    Hand Dominance  Right    Next MD Visit  10/16/17    Prior Therapy  yes here for knee        [facility] Adult PT Treatment/Exercise - 10/14/17 0001      Knee/Hip Exercises: Stretches  Passive Hamstring Stretch  Left;3 reps;30 seconds    Quad Stretch  Left;3 reps;30 seconds    Gastroc Stretch  Left;3 reps;30 seconds      Knee/Hip Exercises: Aerobic   Other Aerobic  3 laps around gym - 1 without AD, 2 with SPC - to warm up prior to exercise.       Knee/Hip Exercises: Standing   Stairs  reciprocal pattern up/down 12 steps (3" with BUE light support)     SLS  single leg squats from elevated table x 5 reps each leg.     Other Standing Knee Exercises  semi-tandam stance on blue pads with horiz head turns x 30 sec each side, 2 sets; reviewed body mechanics for stand pivot transfers with demo and VC for handling suggestions.     Other Standing Knee Exercises  resisted lunge with pulling green band (to simulate pulling pt's up in bed) x 20 reps each side.       Knee/Hip Exercises: Seated   Long Arc Quad  Strengthening;Left;1 set;15 reps    Sit to [employer]Sand  1 set;10 reps;without UE support feet even      Knee/Hip Exercises: Prone   Hamstring Curl  1 set;15 reps 5#    Other Prone  Exercises  opp arm/leg x 10 reps each side with core engaged.       Moist Heat Therapy   Number Minutes Moist Heat  --    Moist Heat Location  --      Electrical Stimulation   Electrical Stimulation Location  Lt knee (ant/post)    Electrical Stimulation Action  IFC    Electrical Stimulation Parameters  to tolerance     Electrical Stimulation Goals  Pain      Vasopneumatic   Number Minutes Vasopneumatic   15 minutes    Vasopnuematic Location   Knee Lt    Vasopneumatic Pressure  Low    Vasopneumatic Temperature   34 deg       education: educated pt regarding using Ace wrap to Lt knee (x pattern at back of knee) to assist in decreasing hyperextension. Demonstrated Pattern over pants with tape.   PT Long Term Goals - 09/20/17 1305      PT LONG TERM GOAL #1   Title  I with advanced HEP (11/01/17)    Time  12    Period  Weeks    Status  Revised      PT LONG TERM GOAL #2   Title  Increase Lt knee and hip strength =/> 5-/5 (11/01/17)    Time  12    Period  Weeks    Status  Revised      PT LONG TERM GOAL #3   Title  Patient to report =/> 75% reduction in Lt knee pain (11/01/17)     Time  12    Period  Weeks    Status  Revised improving      PT LONG TERM GOAL #4   Title  Patient to demonstrate normalized gait pattern on even surfaces without assistive device  (11/01/17)     Time  12    Period  Weeks    Status  Revised improving; currently using RW      PT LONG TERM GOAL #5   Title  improve FOTO =/< 46% limited (11/01/17)     Time  12    Period  Weeks    Status  Revised      PT LONG TERM GOAL #  6   Title  Patient to tolerate walking for 30-60 min with minimal to no increase in knee pain (11/01/17)    Time  6    Period  Weeks    Status  New            Plan - 10/14/17 1259    Clinical Impression Statement  Pt tolerated more standing exercises as well as standing time in between exercises with only a 1 point increase in knee pain. She is gradually increasing her  standing tolerance at home, per her report.  She is gradually progressing towards therapy goals.  Pt returns to MD on Wednesday.  Pt requests to decrease freq to 1x/wk.     Rehab Potential  Good    PT Frequency  2x / week    PT Duration  6 weeks    PT Treatment/Interventions  Patient/family education;ADLs/Self Care Home Management;Cryotherapy;Electrical Stimulation;Moist Heat;Iontophoresis 4mg /ml Dexamethasone;Ultrasound;Gait training;Functional mobility training;Therapeutic activities;Therapeutic exercise;Dry needling;Manual techniques    PT Next Visit Plan  continue ROM, and strengthening of LLE.     Consulted and Agree with Plan of Care  Patient       Patient will benefit from skilled therapeutic intervention in order to improve the following deficits and impairments:  Abnormal gait, Decreased activity tolerance, Decreased balance, Postural dysfunction, Improper body mechanics, Decreased range of motion, Decreased strength  Visit Diagnosis: Chronic pain of left knee  Muscle weakness (generalized)  Abnormal posture  Other abnormalities of gait and mobility     Problem List Patient Active Problem List   Diagnosis Date Noted  . S/P arthroscopy of left knee 07/18/2017  . Acute pain of left knee 06/17/2017  . Meniscal cyst, left 06/17/2017  . Cyst of lateral meniscus of left knee 05/08/2017  . Anterior cruciate ligament sprain 05/08/2017  . Morbid obesity (HCC) 05/08/2017  . History of obstructive sleep apnea 04/09/2017  . Hypothyroidism 04/09/2017  . PCOS (polycystic ovarian syndrome) 04/09/2017  . Anxiety 04/09/2017  . Chronic pain of left knee 04/09/2017  . Gastroesophageal reflux disease with esophagitis 04/09/2017   Mayer Camel, PTA 10/14/17 1:30 PM  [facility] [place] [address] [place], [place], [address] Phone: [phone]   Fax:  [phone]  Name: Michaela Holland MRN: [id number] Date of Birth:  [birth year]

## 2017-10-16 ENCOUNTER — Ambulatory Visit (INDEPENDENT_AMBULATORY_CARE_PROVIDER_SITE_OTHER): Payer: 59 | Admitting: Orthopaedic Surgery

## 2017-10-16 ENCOUNTER — Ambulatory Visit (INDEPENDENT_AMBULATORY_CARE_PROVIDER_SITE_OTHER): Payer: 59

## 2017-10-16 ENCOUNTER — Encounter (INDEPENDENT_AMBULATORY_CARE_PROVIDER_SITE_OTHER): Payer: Self-pay | Admitting: Orthopaedic Surgery

## 2017-10-16 DIAGNOSIS — Z9889 Other specified postprocedural states: Secondary | ICD-10-CM

## 2017-10-16 NOTE — Progress Notes (Signed)
Michaela Holland continues to follow-up for her left knee.  She is now over 3 months out from a left knee arthroscopy where we found grade 2 to grade III chondromalacia and a small area of the medial femoral condyle.  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  We had to keep her out of work due to the severity of her knee pain.  Since surgery we have tried a steroid injection in her knee as well as hyaluronic acid.  She is now on extensive physical therapy and using a walker or cane when she can offload her knee.  Therapy is now transitioning her to aquatic therapy in hopes of increasing the mobility of her knee and decreasing her pain as well as working with weight loss.  On exam today she is walking with a limp.  The knee is stable with great range of motion.  X-rays show medial joint space narrowing of the left knee medial compartment.  This point we will keep her out of work for at least 4 weeks as she continues therapy.  If she does not make significant improvements and may have to discuss other forms of employment so she is not on her feet all day long.

## 2017-10-17 ENCOUNTER — Encounter: Payer: Self-pay | Admitting: Physical Therapy

## 2017-10-17 ENCOUNTER — Encounter (INDEPENDENT_AMBULATORY_CARE_PROVIDER_SITE_OTHER): Payer: 59

## 2017-10-22 ENCOUNTER — Encounter: Payer: Self-pay | Admitting: Physical Therapy

## 2017-10-22 ENCOUNTER — Encounter (INDEPENDENT_AMBULATORY_CARE_PROVIDER_SITE_OTHER): Payer: Self-pay | Admitting: Family Medicine

## 2017-10-22 ENCOUNTER — Ambulatory Visit (INDEPENDENT_AMBULATORY_CARE_PROVIDER_SITE_OTHER): Payer: 59 | Admitting: Family Medicine

## 2017-10-22 VITALS — BP 125/77 | HR 67 | Temp 98.0°F | Ht 64.0 in | Wt 313.0 lb

## 2017-10-22 DIAGNOSIS — Z9189 Other specified personal risk factors, not elsewhere classified: Secondary | ICD-10-CM

## 2017-10-22 DIAGNOSIS — R5383 Other fatigue: Secondary | ICD-10-CM | POA: Diagnosis not present

## 2017-10-22 DIAGNOSIS — E559 Vitamin D deficiency, unspecified: Secondary | ICD-10-CM

## 2017-10-22 DIAGNOSIS — R0602 Shortness of breath: Secondary | ICD-10-CM

## 2017-10-22 DIAGNOSIS — Z1331 Encounter for screening for depression: Secondary | ICD-10-CM | POA: Diagnosis not present

## 2017-10-22 DIAGNOSIS — Z6841 Body Mass Index (BMI) 40.0 and over, adult: Secondary | ICD-10-CM | POA: Diagnosis not present

## 2017-10-22 DIAGNOSIS — E282 Polycystic ovarian syndrome: Secondary | ICD-10-CM

## 2017-10-22 DIAGNOSIS — Z0289 Encounter for other administrative examinations: Secondary | ICD-10-CM

## 2017-10-22 NOTE — Progress Notes (Signed)
Office: [phone]  /  Fax: [phone]   Dear Dr. Denyse Amass,   Thank you for referring Cheri Guppy Appelbaum to our clinic. The following note includes my evaluation and treatment recommendations.  HPI:   Chief Complaint: OBESITY    Michaela Holland has been referred by Rodolph Bong, MD for consultation regarding her obesity and obesity related comorbidities.    Michaela Holland (MR# [id number]) is a 37 y.o. female who presents on 10/22/2017 for obesity evaluation and treatment. Current BMI is Body mass index is 53.73 kg/m.Michaela Holland has been struggling with her weight for many years and has been unsuccessful in either losing weight, maintaining weight loss, or reaching her healthy weight goal.     Jenavive attended our information session and states she is currently in the action stage of change and ready to dedicate time achieving and maintaining a healthier weight. Cora is interested in becoming our patient and working on intensive lifestyle modifications including (but not limited to) diet, exercise and weight loss.    Jonelle states her family eats meals together she thinks her family will eat healthier with  her her desired weight loss is 143 lbs she has been heavy most of  her life she started gaining weight in high school her heaviest weight ever was 314 lbs. she has significant food cravings issues  she snacks frequently in the evenings she skips meals frequently she is frequently drinking liquids with calories she frequently makes poor food choices she frequently eats larger portions than normal  she struggles with emotional eating    Fatigue Kashmir feels her energy is lower than it should be. She has noticed increased fatigue with increase in weight and has not worsened recently. Marysue admits to daytime somnolence and  admits to waking up still tired. Patient is at risk for obstructive sleep apnea. Patent has a history of symptoms of daytime fatigue. Patient generally gets 7 hours of sleep per night,  and states they generally have nightime awakenings. Snoring is present. Apneic episodes are not present. Epworth Sleepiness Score is 7.  Dyspnea on exertion Yitzel notes increasing shortness of breath with exercising and seems to have increased with increase in weight. She notes getting out of breath sooner with activity than she used to. This has not gotten worse recently. Makenzee denies orthopnea.  Vitamin D deficiency Reginae has a diagnosis of vitamin D deficiency. She has a historical diagnosis. Hawley is not on Vit D supplement currently, She notes fatigue and denies nausea, vomiting or muscle weakness.  At risk for osteopenia and osteoporosis Fynn is at higher risk of osteopenia and osteoporosis due to vitamin D deficiency.   Polycystic Ovary Syndrome Sarayah has a history of polycystic ovary syndrome  Depression Screen Isla's Food and Mood (modified PHQ-9) score was  Depression screen PHQ 2/9 10/22/2017  Decreased Interest 3  Down, Depressed, Hopeless 3  PHQ - 2 Score 6  Altered sleeping 2  Tired, decreased energy 3  Change in appetite 3  Feeling bad or failure about yourself  3  Trouble concentrating 1  Moving slowly or fidgety/restless 1  Suicidal thoughts 0  PHQ-9 Score 19  Difficult doing work/chores Very difficult    ALLERGIES: Allergies  Allergen Reactions  . Sulfa Antibiotics Hives and Shortness Of Breath  . Other     Nuts severe reaction difficulty breathing , throat swelling; mostly tree nuts but not all tree nuts     MEDICATIONS: Current Outpatient Medications on File Prior to Visit  Medication Sig Dispense Refill  . Black Pepper-Turmeric (TURMERIC CURCUMIN) 01-999 MG CAPS Take 1 capsule by mouth daily.    . citalopram (CELEXA) 10 MG tablet Take 1 tablet (10 mg total) by mouth daily. 30 tablet 2  . clonazePAM (KLONOPIN) 0.5 MG tablet Take 0.5-1 tablets (0.25-0.5 mg total) by mouth 2 (two) times daily as needed for anxiety (#30 for 90 days.). 30 tablet 0  . gabapentin  (NEURONTIN) 300 MG capsule One tab PO qHS for a week, then BID for a week, then TID. May double weekly to a max of 3,600mg /day 180 capsule 3  . Glucosamine-Chondroit-Vit C-Mn (GLUCOSAMINE CHONDR 1500 COMPLX PO) Take 1 capsule by mouth daily.    Michaela Kitchen. ibuprofen (ADVIL,MOTRIN) 200 MG tablet Take 800 mg by mouth every 8 (eight) hours as needed (for pain.).    Michaela Kitchen. metFORMIN (GLUCOPHAGE-XR) 500 MG 24 hr tablet Take 1 tablet (500 mg total) by mouth at bedtime. 90 tablet 3  . omeprazole (PRILOSEC) 40 MG capsule Take 1 capsule (40 mg total) by mouth daily. (Patient taking differently: Take 40 mg by mouth every evening. ) 30 capsule 3  . ranitidine (ZANTAC) 150 MG tablet Take 150 mg by mouth daily as needed for heartburn.      No current facility-administered medications on file prior to visit.     PAST MEDICAL HISTORY: Past Medical History:  Diagnosis Date  . Anxiety   . Back pain   . Chronic pain of left knee   . Depression   . Diabetes mellitus without complication (HCC)    denies this diagnosis   . Family history of adverse reaction to anesthesia    per patient ; father was having abdominal surgery and had to be placed on medicine to keep his blood pressure up; he never had any issues with low BP before the surgery "   . Gallbladder problem   . GERD (gastroesophageal reflux disease)   . Gout    believes only occurred once in feet;   . Hypertension    denies this diagnosis   . Hypothyroidism   . Joint pain   . Obesity   . Osteoarthritis   . Palpitations   . PCOS (polycystic ovarian syndrome)   . PONV (postoperative nausea and vomiting)    with c section; no issues with followwing procedures   . Seasonal allergies   . Seizures (HCC)    has had "pre-seizure" activity in the brain but deneis any actual seizures   . Sleep apnea    does not currently use due to insurance   . Vitamin D deficiency     PAST SURGICAL HISTORY: Past Surgical History:  Procedure Laterality Date  . CESAREAN  SECTION    . CHOLECYSTECTOMY    . ESOPHAGOGASTRODUODENOSCOPY  2001 and 2018  . GANGLION CYST EXCISION Left 2014   foot  . KNEE ARTHROSCOPY Left 06/28/2017   Procedure: LEFT KNEE ARTHROSCOPY with debridement;  Surgeon: Kathryne HitchBlackman, Christopher Y, MD;  Location: [facility];  Service: Orthopedics;  Laterality: Left;    SOCIAL HISTORY: Social History   Tobacco Use  . Smoking status: Never Smoker  . Smokeless tobacco: Never Used  Substance Use Topics  . Alcohol use: Yes    Comment: occasioanlly   . Drug use: No    FAMILY HISTORY: Family History  Problem Relation Age of Onset  . Polycystic ovary syndrome Mother   . Hypothyroidism Mother   . Hyperlipidemia Mother   . Depression Mother   . Obesity  Mother   . Polycystic ovary syndrome Sister   . Hypothyroidism Sister   . Hypertension Father   . Hyperlipidemia Father   . Cancer Father   . Depression Father   . Anxiety disorder Father   . Obesity Father     ROS: Review of Systems  Constitutional: Positive for malaise/fatigue. Negative for weight loss.       Breasts pain  Eyes:       Wear glasses or contacts  Respiratory: Positive for shortness of breath (with exertion).   Cardiovascular: Negative for orthopnea.       Very cold feet or hands  Gastrointestinal: Positive for heartburn. Negative for nausea and vomiting.  Musculoskeletal: Positive for back pain.       Muscle or joint pain Negative muscle weakness  Skin: Positive for itching and rash.       Dryness   Psychiatric/Behavioral: Positive for depression. Negative for suicidal ideas. The patient is nervous/anxious.        + Stress    PHYSICAL EXAM: Blood pressure 125/77, pulse 67, temperature 98 F (36.7 C), temperature source Oral, height 5\' 4"  (1.626 m), weight (!) 313 lb (142 kg), last menstrual period 09/27/2017, SpO2 100 %. Body mass index is 53.73 kg/m. Physical Exam  Constitutional: She is oriented to person, place, and time. She appears well-developed and  well-nourished.  HENT:  Head: Normocephalic and atraumatic.  Nose: Nose normal.  Eyes: EOM are normal. No scleral icterus.  Neck: Normal range of motion. Neck supple. No thyromegaly present.  Cardiovascular: Normal rate and regular rhythm.  Pulmonary/Chest: Effort normal. No respiratory distress.  Abdominal: Soft. There is no tenderness.  + Obesity  Musculoskeletal:  Range of Motion normal in all 4 extremities Tenderness of the knee noted in bilateral lower extremities  Neurological: She is alert and oriented to person, place, and time. Coordination normal.  Skin: Skin is warm and dry.  Psychiatric: She has a normal mood and affect. Her behavior is normal.  Vitals reviewed.   RECENT LABS AND TESTS: BMET    Component Value Date/Time   NA 137 07/11/2017 1204   K 4.1 07/11/2017 1204   CL 102 07/11/2017 1204   CO2 28 07/11/2017 1204   GLUCOSE 92 07/11/2017 1204   BUN 12 07/11/2017 1204   CREATININE 0.53 07/11/2017 1204   CALCIUM 9.2 07/11/2017 1204   GFRNONAA >60 06/25/2017 1036   GFRAA >60 06/25/2017 1036   Lab Results  Component Value Date   HGBA1C 5.1 07/11/2017   No results found for: INSULIN CBC    Component Value Date/Time   WBC 8.4 06/25/2017 1036   RBC 4.78 06/25/2017 1036   HGB 13.2 06/25/2017 1036   HCT 41.2 06/25/2017 1036   PLT 327 06/25/2017 1036   MCV 86.2 06/25/2017 1036   MCH 27.6 06/25/2017 1036   MCHC 32.0 06/25/2017 1036   RDW 13.2 06/25/2017 1036   Iron/TIBC/Ferritin/ %Sat No results found for: IRON, TIBC, FERRITIN, IRONPCTSAT Lipid Panel  No results found for: CHOL, TRIG, HDL, CHOLHDL, VLDL, LDLCALC, LDLDIRECT Hepatic Function Panel  No results found for: PROT, ALBUMIN, AST, ALT, ALKPHOS, BILITOT, BILIDIR, IBILI    Component Value Date/Time   TSH 1.94 07/11/2017 1204    ECG  shows NSR with a rate of 63 BPM INDIRECT CALORIMETER done today shows a VO2 of 361 and a REE of 2513.  Her calculated basal metabolic rate is 1610 thus her basal  metabolic rate is better than expected.  ASSESSMENT AND PLAN: Other fatigue - Plan: EKG 12-Lead, Vitamin B12, CBC With Differential, Comprehensive metabolic panel, Folate, Hemoglobin A1c, Lipid Panel With LDL/HDL Ratio, T3, T4, free, TSH  Shortness of breath on exertion  Vitamin D deficiency - Plan: VITAMIN D 25 Hydroxy (Vit-D Deficiency, Fractures)  PCOS (polycystic ovarian syndrome) - Plan: Insulin, random  Depression screening  At risk for osteoporosis  Class 3 severe obesity with serious comorbidity and body mass index (BMI) of 50.0 to 59.9 in adult, unspecified obesity type (HCC)  PLAN:  Fatigue Amyla was informed that her fatigue may be related to obesity, depression or many other causes. Labs will be ordered, and in the meanwhile Yamilka has agreed to work on diet, exercise and weight loss to help with fatigue. Proper sleep hygiene was discussed including the need for 7-8 hours of quality sleep each night. A sleep study was not ordered based on symptoms and Epworth score.  Dyspnea on exertion Edra's shortness of breath appears to be obesity related and exercise induced. She has agreed to work on weight loss and gradually increase exercise to treat her exercise induced shortness of breath. If Ayana follows our instructions and loses weight without improvement of her shortness of breath, we will plan to refer to pulmonology. We will monitor this condition regularly. Mishawn agrees to this plan.  Vitamin D Deficiency Olamae was informed that low vitamin D levels contributes to fatigue and are associated with obesity, breast, and colon cancer. She will follow up for routine testing of vitamin D, at least 2-3 times per year. She was informed of the risk of over-replacement of vitamin D and agrees to not increase her dose unless she discusses this with [place] first. We will check labs and Jenaveve agrees to follow up with our clinic in 2 weeks.  At risk for osteopenia and osteoporosis Lilyahna is at  risk for osteopenia and osteoporsis due to her vitamin D deficiency. She was encouraged to take her vitamin D and follow her higher calcium diet and increase strengthening exercise to help strengthen her bones and decrease her risk of osteopenia and osteoporosis.  Polycystic Ovary Syndrome We will check labs today. Lorah agrees to follow up with our clinic in 2 weeks.  Depression Screen Janny had a strongly positive depression screening. Depression is commonly associated with obesity and often results in emotional eating behaviors. We will monitor this closely and work on CBT to help improve the non-hunger eating patterns. Referral to Psychology may be required if no improvement is seen as she continues in our clinic.  Obesity Aleeah is currently in the action stage of change and her goal is to continue with weight loss efforts. I recommend Veronika begin the structured treatment plan as follows:  She has agreed to follow the Category 3 plan + 300 calories Jorja has been instructed to eventually work up to a goal of 150 minutes of combined cardio and strengthening exercise per week for weight loss and overall health benefits. We discussed the following Behavioral Modification Strategies today: increasing lean protein intake, decreasing simple carbohydrates, decrease eating out, decrease junk food, decrease liquid calories, and keeping healthy foods in the home   She was informed of the importance of frequent follow up visits to maximize her success with intensive lifestyle modifications for her multiple health conditions. She was informed we would discuss her lab results at her next visit unless there is a critical issue that needs to be addressed sooner. Marieanne agreed to keep her next visit  at the agreed upon time to discuss these results.    OBESITY BEHAVIORAL INTERVENTION VISIT  Today's visit was # 1 out of 22.  Starting weight: 313 lbs Starting date: 10/22/17 Today's weight : 313 lbs  Today's  date: 10/22/2017 Total lbs lost to date: 0 (Patients must lose 7 lbs in the first 6 months to continue with counseling)   ASK: We discussed the diagnosis of obesity with Garald Balding today and Alicyn agreed to give [place] permission to discuss obesity behavioral modification therapy today.  ASSESS: Gabriana has the diagnosis of obesity and her BMI today is 53.7 Lougenia is in the action stage of change   ADVISE: Siham was educated on the multiple health risks of obesity as well as the benefit of weight loss to improve her health. She was advised of the need for long term treatment and the importance of lifestyle modifications.  AGREE: Multiple dietary modification options and treatment options were discussed and  Petrice agreed to the above obesity treatment plan.   I, Burt Knack, am acting as transcriptionist for Debbra Riding, MD   I have reviewed the above documentation for accuracy and completeness, and I agree with the above. - Debbra Riding, MD

## 2017-10-23 ENCOUNTER — Ambulatory Visit (INDEPENDENT_AMBULATORY_CARE_PROVIDER_SITE_OTHER): Payer: 59 | Admitting: Physical Therapy

## 2017-10-23 DIAGNOSIS — R293 Abnormal posture: Secondary | ICD-10-CM | POA: Diagnosis not present

## 2017-10-23 DIAGNOSIS — M25562 Pain in left knee: Secondary | ICD-10-CM | POA: Diagnosis not present

## 2017-10-23 DIAGNOSIS — G8929 Other chronic pain: Secondary | ICD-10-CM

## 2017-10-23 DIAGNOSIS — M6281 Muscle weakness (generalized): Secondary | ICD-10-CM | POA: Diagnosis not present

## 2017-10-23 LAB — COMPREHENSIVE METABOLIC PANEL
A/G RATIO: 1.5 (ref 1.2–2.2)
ALK PHOS: 61 IU/L (ref 39–117)
ALT: 11 IU/L (ref 0–32)
AST: 13 IU/L (ref 0–40)
Albumin: 4.1 g/dL (ref 3.5–5.5)
BILIRUBIN TOTAL: 0.5 mg/dL (ref 0.0–1.2)
BUN/Creatinine Ratio: 18 (ref 9–23)
BUN: 10 mg/dL (ref 6–20)
CHLORIDE: 100 mmol/L (ref 96–106)
CO2: 21 mmol/L (ref 20–29)
Calcium: 9.1 mg/dL (ref 8.7–10.2)
Creatinine, Ser: 0.55 mg/dL — ABNORMAL LOW (ref 0.57–1.00)
GFR calc Af Amer: 140 mL/min/{1.73_m2} (ref >59)
GFR, EST NON AFRICAN AMERICAN: 121 mL/min/{1.73_m2} (ref >59)
GLOBULIN, TOTAL: 2.7 g/dL (ref 1.5–4.5)
Glucose: 81 mg/dL (ref 65–99)
POTASSIUM: 4.3 mmol/L (ref 3.5–5.2)
SODIUM: 138 mmol/L (ref 134–144)
Total Protein: 6.8 g/dL (ref 6.0–8.5)

## 2017-10-23 LAB — CBC WITH DIFFERENTIAL
BASOS: 0 %
Basophils Absolute: 0 10*3/uL (ref 0.0–0.2)
EOS (ABSOLUTE): 0.2 10*3/uL (ref 0.0–0.4)
EOS: 2 %
HEMATOCRIT: 37.8 % (ref 34.0–46.6)
Hemoglobin: 12.8 g/dL (ref 11.1–15.9)
Immature Grans (Abs): 0 10*3/uL (ref 0.0–0.1)
Immature Granulocytes: 0 %
LYMPHS ABS: 1.7 10*3/uL (ref 0.7–3.1)
Lymphs: 18 %
MCH: 28.3 pg (ref 26.6–33.0)
MCHC: 33.9 g/dL (ref 31.5–35.7)
MCV: 84 fL (ref 79–97)
MONOS ABS: 0.6 10*3/uL (ref 0.1–0.9)
Monocytes: 6 %
NEUTROS PCT: 74 %
Neutrophils Absolute: 7 10*3/uL (ref 1.4–7.0)
RBC: 4.52 x10E6/uL (ref 3.77–5.28)
RDW: 13.5 % (ref 12.3–15.4)
WBC: 9.5 10*3/uL (ref 3.4–10.8)

## 2017-10-23 LAB — LIPID PANEL WITH LDL/HDL RATIO
Cholesterol, Total: 182 mg/dL (ref 100–199)
HDL: 54 mg/dL (ref >39)
LDL CALC: 105 mg/dL — AB (ref 0–99)
LDL/HDL RATIO: 1.9 ratio (ref 0.0–3.2)
TRIGLYCERIDES: 117 mg/dL (ref 0–149)
VLDL Cholesterol Cal: 23 mg/dL (ref 5–40)

## 2017-10-23 LAB — VITAMIN B12: Vitamin B-12: 214 pg/mL — ABNORMAL LOW (ref 232–1245)

## 2017-10-23 LAB — VITAMIN D 25 HYDROXY (VIT D DEFICIENCY, FRACTURES): VIT D 25 HYDROXY: 14 ng/mL — AB (ref 30.0–100.0)

## 2017-10-23 LAB — TSH: TSH: 2.95 u[IU]/mL (ref 0.450–4.500)

## 2017-10-23 LAB — INSULIN, RANDOM: INSULIN: 8.8 u[IU]/mL (ref 2.6–24.9)

## 2017-10-23 LAB — FOLATE: FOLATE: 7.3 ng/mL (ref >3.0)

## 2017-10-23 LAB — T3: T3 TOTAL: 143 ng/dL (ref 71–180)

## 2017-10-23 LAB — HEMOGLOBIN A1C
ESTIMATED AVERAGE GLUCOSE: 94 mg/dL
HEMOGLOBIN A1C: 4.9 % (ref 4.8–5.6)

## 2017-10-23 LAB — T4, FREE: Free T4: 1.2 ng/dL (ref 0.82–1.77)

## 2017-10-23 NOTE — Therapy (Signed)
[place], [place], [address] Phone: [phone]   Fax:  [phone]  Physical Therapy Treatment  Patient Details  Name: Michaela Holland MRN: [id number] Date of Birth: 12-Jun-1981 Referring Provider: Dr. Jean Rosenthal   Encounter Date: 10/23/2017  PT End of Session - 10/23/17 1156    Visit Number  15    Number of Visits  22    Date for PT Re-Evaluation  11/01/17    PT Start Time  1102    PT Stop Time  1203    PT Time Calculation (min)  61 min    Activity Tolerance  Patient tolerated treatment well    Behavior During Therapy  [facility] for tasks assessed/performed       Past Medical History:  Diagnosis Date  . Anxiety   . Back pain   . Chronic pain of left knee   . Depression   . Diabetes mellitus without complication ([place])    denies this diagnosis   . Family history of adverse reaction to anesthesia    per patient ; father was having abdominal surgery and had to be placed on medicine to keep his blood pressure up; he never had any issues with low BP before the surgery "   . Gallbladder problem   . GERD (gastroesophageal reflux disease)   . Gout    believes only occurred once in feet;   . Hypertension    denies this diagnosis   . Hypothyroidism   . Joint pain   . Obesity   . Osteoarthritis   . Palpitations   . PCOS (polycystic ovarian syndrome)   . PONV (postoperative nausea and vomiting)    with c section; no issues with followwing procedures   . Seasonal allergies   . Seizures ([place])    has had "pre-seizure" activity in the brain but deneis any actual seizures   . Sleep apnea    does not currently use due to insurance   . Vitamin D deficiency     Past Surgical History:  Procedure Laterality Date  . CESAREAN SECTION    . CHOLECYSTECTOMY    . ESOPHAGOGASTRODUODENOSCOPY  2001 and 2018  . GANGLION CYST EXCISION Left 2014   foot  . KNEE ARTHROSCOPY Left 06/28/2017   Procedure:  LEFT KNEE ARTHROSCOPY with debridement;  Surgeon: Mcarthur Rossetti, MD;  Location: [facility];  Service: Orthopedics;  Laterality: Left;    There were no vitals filed for this visit.  Subjective Assessment - 10/23/17 1111    Subjective  Pt ambulates without AD.  she reports things are improving.  she continues to go to pool 3x/wk. She has started weight management. She's feeling hopeful.     Patient Stated Goals  to help increase strength and endurance     Currently in Pain?  Yes    Pain Score  2     Pain Location  Knee    Pain Orientation  Left;Anterior;Posterior    Pain Descriptors / Indicators  Aching    Aggravating Factors   prolonged walking, stairs    Pain Relieving Factors  ice, vaso, stretches, avoiding hyperext of knee         [facility] PT Assessment - 10/23/17 0001      Assessment   Medical Diagnosis  Lt knee scope    Referring Provider  Dr. Jean Rosenthal    Onset Date/Surgical Date  06/28/17    Hand Dominance  Right    Next  MD Visit  11/13/17    Prior Therapy  yes here for knee       Flexibility   Quadriceps  110 deg LLE. 114 deg RLE       [facility] Adult PT Treatment/Exercise - 10/23/17 0001      Knee/Hip Exercises: Stretches   Passive Hamstring Stretch  Left;3 reps;30 seconds    Quad Stretch  Left;3 reps;30 seconds    Piriformis Stretch  Left;Right;1 rep;20 seconds    Gastroc Stretch  Left;3 reps;30 seconds      Knee/Hip Exercises: Aerobic   Recumbent Bike  L1: 5 min  PTA present to discuss progress    Other Aerobic  Lap around gym in between exercises to decrease stiffness.       Knee/Hip Exercises: Standing   Stairs  reciprocal pattern up/down 20 steps (3-6" with BUE light support)     SLS  SLS attempts on LLE on blue pad, able to make it 5 sec.     Other Standing Knee Exercises  semi-tandam stance on blue pads with horiz head turns x 30 sec each side, 2 sets;    Other Standing Knee Exercises  warrior 2 pose held 10 sec, then pulses 10 sec x 2 sets  (fatigues quickly)      Knee/Hip Exercises: Seated   Long Arc Quad  Strengthening;Both;1 set;10 reps 5 sec holds      Knee/Hip Exercises: Prone   Hamstring Curl  1 set;15 reps 5#      Moist Heat Therapy   Number Minutes Moist Heat  15 Minutes    Moist Heat Location  Lumbar Spine      Vasopneumatic   Number Minutes Vasopneumatic   15 minutes    Vasopnuematic Location   Knee Lt    Vasopneumatic Pressure  Low    Vasopneumatic Temperature   34 deg                   PT Long Term Goals - 10/23/17 1146      PT LONG TERM GOAL #1   Title  I with advanced HEP (11/01/17)    Time  12    Period  Weeks    Status  On-going      PT LONG TERM GOAL #2   Title  Increase Lt knee and hip strength =/> 5-/5 (11/01/17)    Time  12    Period  Weeks    Status  On-going      PT LONG TERM GOAL #3   Title  Patient to report =/> 75% reduction in Lt knee pain (11/01/17)     Time  12    Period  Weeks    Status  On-going      PT LONG TERM GOAL #4   Title  Patient to demonstrate normalized gait pattern on even surfaces without assistive device  (11/01/17)     Time  12    Period  Weeks    Status  Partially Met      PT LONG TERM GOAL #5   Title  improve FOTO =/< 46% limited (11/01/17)     Time  12    Period  Weeks    Status  On-going      PT LONG TERM GOAL #6   Title  Patient to tolerate walking for 30-60 min with minimal to no increase in knee pain (11/01/17)    Time  6    Period  Weeks    Status  On-going            Plan - 10/23/17 1238    Clinical Impression Statement  Pt now ambulating at home and community without assistive device; minor gait deviations observed in session.  She tolerated increased standing exercises, as well as heavier resistance for hamstring curls with min increase in symptoms.  Pt making gradual progress towards goals.     Rehab Potential  Good    PT Frequency  2x / week    PT Duration  6 weeks    PT Treatment/Interventions  Patient/family  education;ADLs/Self Care Home Management;Cryotherapy;Electrical Stimulation;Moist Heat;Iontophoresis 55m/ml Dexamethasone;Ultrasound;Gait training;Functional mobility training;Therapeutic activities;Therapeutic exercise;Dry needling;Manual techniques    PT Next Visit Plan  continue progressive strengthening of LLE.     Consulted and Agree with Plan of Care  Patient       Patient will benefit from skilled therapeutic intervention in order to improve the following deficits and impairments:  Abnormal gait, Decreased activity tolerance, Decreased balance, Postural dysfunction, Improper body mechanics, Decreased range of motion, Decreased strength  Visit Diagnosis: Chronic pain of left knee  Muscle weakness (generalized)  Abnormal posture     Problem List Patient Active Problem List   Diagnosis Date Noted  . Other fatigue 10/22/2017  . Shortness of breath on exertion 10/22/2017  . Vitamin D deficiency 10/22/2017  . S/P arthroscopy of left knee 07/18/2017  . Acute pain of left knee 06/17/2017  . Meniscal cyst, left 06/17/2017  . Cyst of lateral meniscus of left knee 05/08/2017  . Anterior cruciate ligament sprain 05/08/2017  . [place]bid [place] 05/08[place]  . History of obstructive sleep apnea 04/09/2017  . Hypothyroidism 04/09/2017  . PCOS (polycystic ovarian syndrome) 04/09/2017  . Anxiety 04/09/2017  . Chronic pain of left knee 04/09/2017  . Gastroesophageal reflux disease with esophagitis 04/09/2017   JKerin Perna PTA 10/23/17 12:43 PM  [address] [address]Phone: [phone]  Fax:  [phone] Name: Michaela LEINWEBERMRN: [id number] of Birth: 911-14-1982

## 2017-10-24 ENCOUNTER — Ambulatory Visit (HOSPITAL_COMMUNITY): Payer: 59 | Admitting: Psychiatry

## 2017-10-28 ENCOUNTER — Encounter (INDEPENDENT_AMBULATORY_CARE_PROVIDER_SITE_OTHER): Payer: Self-pay | Admitting: Family Medicine

## 2017-10-30 ENCOUNTER — Ambulatory Visit (INDEPENDENT_AMBULATORY_CARE_PROVIDER_SITE_OTHER): Payer: 59 | Admitting: Physical Therapy

## 2017-10-30 DIAGNOSIS — M6281 Muscle weakness (generalized): Secondary | ICD-10-CM | POA: Diagnosis not present

## 2017-10-30 DIAGNOSIS — R2689 Other abnormalities of gait and mobility: Secondary | ICD-10-CM | POA: Diagnosis not present

## 2017-10-30 DIAGNOSIS — R293 Abnormal posture: Secondary | ICD-10-CM

## 2017-10-30 DIAGNOSIS — M25562 Pain in left knee: Secondary | ICD-10-CM

## 2017-10-30 DIAGNOSIS — G8929 Other chronic pain: Secondary | ICD-10-CM | POA: Diagnosis not present

## 2017-10-30 NOTE — Therapy (Signed)
[place], [place], [address] Phone: [phone]   Fax:  [phone]  Physical Therapy Treatment  Patient Details  Name: Michaela Holland MRN: [id number] Date of Birth: Dec 05, 1980 Referring Provider: Dr. Jean Rosenthal   Encounter Date: 10/30/2017  PT End of Session - 10/30/17 1110    Visit Number  16    Number of Visits  32    Date for PT Re-Evaluation  12/11/17    PT Start Time  1105 Pt arrived late    PT Stop Time  1205    PT Time Calculation (min)  60 min    Activity Tolerance  Patient tolerated treatment well    Behavior During Therapy  [facility] for tasks assessed/performed       Past Medical History:  Diagnosis Date  . Anxiety   . Back pain   . Chronic pain of left knee   . Depression   . Diabetes mellitus without complication ([place])    denies this diagnosis   . Family history of adverse reaction to anesthesia    per patient ; father was having abdominal surgery and had to be placed on medicine to keep his blood pressure up; he never had any issues with low BP before the surgery "   . Gallbladder problem   . GERD (gastroesophageal reflux disease)   . Gout    believes only occurred once in feet;   . Hypertension    denies this diagnosis   . Hypothyroidism   . Joint pain   . Obesity   . Osteoarthritis   . Palpitations   . PCOS (polycystic ovarian syndrome)   . PONV (postoperative nausea and vomiting)    with c section; no issues with followwing procedures   . Seasonal allergies   . Seizures ([place])    has had "pre-seizure" activity in the brain but deneis any actual seizures   . Sleep apnea    does not currently use due to insurance   . Vitamin D deficiency     Past Surgical History:  Procedure Laterality Date  . CESAREAN SECTION    . CHOLECYSTECTOMY    . ESOPHAGOGASTRODUODENOSCOPY  2001 and 2018  . GANGLION CYST EXCISION Left 2014   foot  . KNEE ARTHROSCOPY Left 06/28/2017    Procedure: LEFT KNEE ARTHROSCOPY with debridement;  Surgeon: Mcarthur Rossetti, MD;  Location: [facility];  Service: Orthopedics;  Laterality: Left;    There were no vitals filed for this visit.  Subjective Assessment - 10/30/17 1112    Subjective  Pt reports she had a major flare up after last session- had to use walker for a few days again. Pain up to 6/10, prevented her from doing much.  Stretches helped some.  She started meal plan with weight management last week.     Patient Stated Goals  to help increase strength and endurance     Currently in Pain?  Yes    Pain Score  3     Pain Location  Knee    Pain Orientation  Left    Pain Descriptors / Indicators  Aching;Dull    Aggravating Factors   prolonged standing, walking, hyperextending knee    Pain Relieving Factors  ice, vaso, stretches.          [facility] PT Assessment - 10/30/17 0001      Observation/Other Assessments   Focus on Therapeutic Outcomes (FOTO)   52% limited (goal of 46%)  Strength   Left Hip Flexion  5/5    Left Hip Extension  5/5    Left Hip ABduction  5/5    Left Hip ADduction  -- 5-/5    Left Knee Flexion  -- 5-/5      Flexibility   Hamstrings  86 deg LLE    Quadriceps  110 deg LLE. 114 deg RLE        [facility] Adult PT Treatment/Exercise - 10/30/17 0001      Knee/Hip Exercises: Stretches   Passive Hamstring Stretch  Left;3 reps;30 seconds    Quad Stretch  Left;3 reps;30 seconds 1 rep for RLE    Gastroc Stretch  3 reps;30 seconds;Left;Right      Knee/Hip Exercises: Aerobic   Recumbent Bike  L1: 6 min  PTA present to discuss progress      Knee/Hip Exercises: Standing   Wall Squat  1 set;15 reps;3 seconds    SLS  SLS each leg, up to 3 sec.     Other Standing Knee Exercises  tandem stance with occasional UE support to steady x 30 sec, with horiz head turns.     Other Standing Knee Exercises  warrior 2 pose held 30 sec, then pulses 10 sec       Knee/Hip Exercises: Sidelying   Hip ADduction   Strengthening;Right;1 set;10 reps trial with LLE on chair, pt preferred other position      Knee/Hip Exercises: Prone   Hamstring Curl  1 set;20 reps 4# BLE      Moist Heat Therapy   Number Minutes Moist Heat  15 Minutes    Moist Heat Location  Lumbar Spine      Vasopneumatic   Number Minutes Vasopneumatic   15 minutes    Vasopnuematic Location   Knee Lt    Vasopneumatic Pressure  Low    Vasopneumatic Temperature   34 deg       Educated pt on walking program progression.  Pt verbalized understanding and will begin to track distance/time/speed at home.     PT Long Term Goals - 10/30/17 1118      PT LONG TERM GOAL #1   Title  I with advanced HEP (12/11/17)    Time  18    Period  Weeks    Status  Revised      PT LONG TERM GOAL #2   Title  Increase Lt knee and hip strength =/> 5-/5 (12/11/17)    Time  18    Period  Weeks    Status  Revised      PT LONG TERM GOAL #3   Title  Patient to report =/> 75% reduction in Lt knee pain (12/11/17)     Time  18    Period  Weeks    Status  Revised 40% reduction in pain      PT LONG TERM GOAL #4   Title  Patient to demonstrate normalized gait pattern on even surfaces without assistive device  (12/11/17)     Time  18    Period  Weeks    Status  Revised      PT LONG TERM GOAL #5   Title  improve FOTO =/< 46% limited (12/11/17)     Time  18    Period  Weeks    Status  Revised      PT LONG TERM GOAL #6   Title  Patient to tolerate walking for 30-60 min with minimal to no increase in knee pain (  12/11/17)    Time  12    Period  Weeks    Status  Revised Pt reports 2-3 point increase in knee pain with walking 30 min             Plan - 10/30/17 1204    Clinical Impression Statement  Pt demonstrated improved Lt hip/knee strength since last assessment; has met LTG #2.  Her quad/hamstring flexibility has stayed steady (4-5 deg difference between her RLE).  Pt reporting 40% reduction in pain since flare up in January.  She has been compliant  with daily exercises (including pool exercises 2-3x/wk).  She states she can only walk 15 min prior to pain increasing 3 points, requiring a seated rest break.  Pt making good progress towards goals and will benefit from continued PT intervention to assist in return to work.     Rehab Potential  Good    PT Frequency  2x / week    PT Duration  6 weeks    PT Treatment/Interventions  Patient/family education;ADLs/Self Care Home Management;Cryotherapy;Electrical Stimulation;Moist Heat;Iontophoresis 32m/ml Dexamethasone;Ultrasound;Gait training;Functional mobility training;Therapeutic activities;Therapeutic exercise;Dry needling;Manual techniques    PT Next Visit Plan  continue progressive strengthening of LLE, with work related exercises.     Consulted and Agree with Plan of Care  Patient       Patient will benefit from skilled therapeutic intervention in order to improve the following deficits and impairments:  Abnormal gait, Decreased activity tolerance, Decreased balance, Postural dysfunction, Improper body mechanics, Decreased range of motion, Decreased strength  Visit Diagnosis: Chronic pain of left knee - Plan: PT plan of care cert/re-cert  Muscle weakness (generalized) - Plan: PT plan of care cert/re-cert  Abnormal posture - Plan: PT plan of care cert/re-cert  Other abnormalities of gait and mobility - Plan: PT plan of care cert/re-cert     Problem List Patient Active Problem List   Diagnosis Date Noted  . Other fatigue 10/22/2017  . Shortness of breath on exertion 10/22/2017  . Vitamin D deficiency 10/22/2017  . S/P arthroscopy of left knee 07/18/2017  . Acute pain of left knee 06/17/2017  . Meniscal cyst, left 06/17/2017  . Cyst of lateral meniscus of left knee 05/08/2017  . Anterior cruciate ligament sprain 05/08/2017  . Morbid obesity ([place] 05/08/2017  . History of obstructive sleep apnea 04/09/2017  . Hypothyroidism 04/09/2017  . PCOS (polycystic ovarian syndrome)  04/09/2017  . Anxiety 04/09/2017  . Chronic pain of left knee 04/09/2017  . Gastroesophageal reflux disease with esophagitis 04/09/2017   JKerin Perna PTA 10/30/17 12:55 PM  Celyn P. HHelene KelpPT, MPH 10/30/17 12:55 PM   [email or web address] [address]Phone: [phone]  Fax:  [phone] Name: AAVIONNA BOWERMRN: [id number] of Birth: [birth year]

## 2017-11-06 ENCOUNTER — Ambulatory Visit (INDEPENDENT_AMBULATORY_CARE_PROVIDER_SITE_OTHER): Payer: 59 | Admitting: Physical Therapy

## 2017-11-06 DIAGNOSIS — M6281 Muscle weakness (generalized): Secondary | ICD-10-CM | POA: Diagnosis not present

## 2017-11-06 DIAGNOSIS — M25562 Pain in left knee: Secondary | ICD-10-CM | POA: Diagnosis not present

## 2017-11-06 DIAGNOSIS — R2689 Other abnormalities of gait and mobility: Secondary | ICD-10-CM | POA: Diagnosis not present

## 2017-11-06 DIAGNOSIS — G8929 Other chronic pain: Secondary | ICD-10-CM | POA: Diagnosis not present

## 2017-11-06 DIAGNOSIS — R293 Abnormal posture: Secondary | ICD-10-CM | POA: Diagnosis not present

## 2017-11-06 NOTE — Therapy (Addendum)
[place], [place], [address] Phone: [phone]   Fax:  [phone]  Physical Therapy Treatment  Patient Details  Name: Michaela Holland MRN: [id number] Date of Birth: 02-07-1981 Referring Provider: Dr. Jean Rosenthal   Encounter Date: 11/06/2017  PT End of Session - 11/06/17 1121    Visit Number  17    Number of Visits  32    Date for PT Re-Evaluation  12/11/17    PT Start Time  1109    PT Stop Time  1208 MHP/ice last 15 min     PT Time Calculation (min)  59 min    Activity Tolerance  Patient limited by pain    Behavior During Therapy  [facility] for tasks assessed/performed       Past Medical History:  Diagnosis Date  . Anxiety   . Back pain   . Chronic pain of left knee   . Depression   . Diabetes mellitus without complication ([place])    denies this diagnosis   . Family history of adverse reaction to anesthesia    per patient ; father was having abdominal surgery and had to be placed on medicine to keep his blood pressure up; he never had any issues with low BP before the surgery "   . Gallbladder problem   . GERD (gastroesophageal reflux disease)   . Gout    believes only occurred once in feet;   . Hypertension    denies this diagnosis   . Hypothyroidism   . Joint pain   . Obesity   . Osteoarthritis   . Palpitations   . PCOS (polycystic ovarian syndrome)   . PONV (postoperative nausea and vomiting)    with c section; no issues with followwing procedures   . Seasonal allergies   . Seizures ([place])    has had "pre-seizure" activity in the brain but deneis any actual seizures   . Sleep apnea    does not currently use due to insurance   . Vitamin D deficiency     Past Surgical History:  Procedure Laterality Date  . CESAREAN SECTION    . CHOLECYSTECTOMY    . ESOPHAGOGASTRODUODENOSCOPY  2001 and 2018  . GANGLION CYST EXCISION Left 2014   foot  . KNEE ARTHROSCOPY Left 06/28/2017    Procedure: LEFT KNEE ARTHROSCOPY with debridement;  Surgeon: Mcarthur Rossetti, MD;  Location: [facility];  Service: Orthopedics;  Laterality: Left;    There were no vitals filed for this visit.  Subjective Assessment - 11/06/17 1114    Subjective  Michaela Holland reports she walked 45 min while shopping Wed; had to use her RW at church that night due to increased pain. She went to pool 3 days in a row (Thur/Fri/Sat) and thinks it might have been too much.  She has been using her RW over last few days    Currently in Pain?  Yes    Pain Score  4     Pain Location  Knee    Pain Orientation  Left    Pain Descriptors / Indicators  Aching;Dull    Aggravating Factors   prolonged standing, walking, hyperextending knee    Pain Relieving Factors  ice, stretches.          [facility] PT Assessment - 11/06/17 0001      Assessment   Medical Diagnosis  Lt knee scope    Referring Provider  Dr. Jean Rosenthal    Onset Date/Surgical Date  06/28/17    Hand Dominance  Right    Next MD Visit  11/13/17      AROM   Right Knee Flexion  134    Left Knee Extension  0    Left Knee Flexion  128      Strength   Left Hip Flexion  5/5    Left Hip Extension  5/5    Left Hip ABduction  5/5    Left Knee Flexion  -- 5-/5    Left Knee Extension  5/5      Flexibility   Hamstrings  88 deg LLE    Quadriceps  110 deg LLE. 114 deg RLE        [facility] Adult PT Treatment/Exercise - 11/06/17 0001      Knee/Hip Exercises: Stretches   Passive Hamstring Stretch  Left;3 reps;Right;60 seconds    Quad Stretch  Left;Right;2 reps;60 seconds    Piriformis Stretch  Right;Left;1 rep;60 seconds    Gastroc Stretch  Right;Left;2 reps;30 seconds      Knee/Hip Exercises: Aerobic   Recumbent Bike  L1: 5 min  PTA present to discuss progress. increased pain at end      Knee/Hip Exercises: Standing   Other Standing Knee Exercises  tandem stance on 1/2 foam roll (flat side down) with occasional UE support to steady x 30 sec       Knee/Hip Exercises: Seated   Long Arc Quad  Strengthening;Left;2 sets;10 reps    Long Arc Quad Weight  4 lbs.    Sit to Sand  1 set;10 reps;without UE support feet even      Knee/Hip Exercises: Prone   Hamstring Curl  1 set;20 reps 4# BLE    Straight Leg Raises  Left 2 reps, increased ant Lt knee pain.       Moist Heat Therapy   Number Minutes Moist Heat  15 Minutes    Moist Heat Location  Knee Lt posterior      Cryotherapy   Number Minutes Cryotherapy  15 Minutes    Cryotherapy Location  Knee Lt    Type of Cryotherapy  Ice pack      Electrical Stimulation   Electrical Stimulation Location  -- pt declined                  PT Long Term Goals - 11/06/17 1232      PT LONG TERM GOAL #1   Title  I with advanced HEP (12/11/17)    Time  18    Period  Weeks    Status  On-going      PT LONG TERM GOAL #2   Title  Increase Lt knee and hip strength =/> 5-/5 (12/11/17)    Time  18    Period  Weeks    Status  Achieved      PT LONG TERM GOAL #3   Title  Patient to report =/> 75% reduction in Lt knee pain (12/11/17)     Time  18    Period  Weeks    Status  On-going reports 40% reduction 11/06/17      PT LONG TERM GOAL #4   Title  Patient to demonstrate normalized gait pattern on even surfaces without assistive device  (12/11/17)     Time  18    Period  Weeks    Status  Partially Met varies dependent on pain level.       PT LONG TERM GOAL #5   Title  improve FOTO =/< 46% limited (12/11/17)     Time  18    Period  Weeks    Status  On-going      PT LONG TERM GOAL #6   Title  Patient to tolerate walking for 30-60 min with minimal to no increase in knee pain (12/11/17)    Time  12    Period  Weeks    Status  On-going            Plan - 11/06/17 1203    Clinical Impression Statement  Pt continues to have flare up of symptoms since last session (1 wk ago). When attempting to advance exercises or exercise time over last few sessions, her Lt knee easily flares up with  increased pain.  She can only walk 5-10 min prior to increase in Lt knee pain, up to 7/10. Her strength and ROM remain unchanged from last visit.  She had decreased exercise tolerance due to elevated pain level. Pt requests to hold therapy until visit with MD.     Rehab Potential  Good    PT Frequency  2x / week    PT Duration  6 weeks    PT Treatment/Interventions  Patient/family education;ADLs/Self Care Home Management;Cryotherapy;Electrical Stimulation;Moist Heat;Iontophoresis 3m/ml Dexamethasone;Ultrasound;Gait training;Functional mobility training;Therapeutic activities;Therapeutic exercise;Dry needling;Manual techniques    PT Next Visit Plan  hold therapy until after MD.  Await further advisement from MD regarding PT (d/c vs continuation)    Consulted and Agree with Plan of Care  Patient       Patient will benefit from skilled therapeutic intervention in order to improve the following deficits and impairments:  Abnormal gait, Decreased activity tolerance, Decreased balance, Postural dysfunction, Improper body mechanics, Decreased range of motion, Decreased strength  Visit Diagnosis: Chronic pain of left knee  Muscle weakness (generalized)  Abnormal posture  Other abnormalities of gait and mobility     Problem List Patient Active Problem List   Diagnosis Date Noted  . Other fatigue 10/22/2017  . Shortness of breath on exertion 10/22/2017  . Vitamin D deficiency 10/22/2017  . S/P arthroscopy of left knee 07/18/2017  . Acute pain of left knee 06/17/2017  . Meniscal cyst, left 06/17/2017  . Cyst of lateral meniscus of left knee 05/08/2017  . Anterior cruciate ligament sprain 05/08/2017  . Morbid obesity ([place] 05/08/2017  . History of obstructive sleep apnea 04/09/2017  . Hypothyroidism 04/09/2017  . PCOS (polycystic ovarian syndrome) 04/09/2017  . Anxiety 04/09/2017  . Chronic pain of left knee 04/09/2017  . Gastroesophageal reflux disease with esophagitis 04/09/2017    JKerin Perna PTA 11/06/17 12:33 PM  [id number] [address]Phone: [phone]  Fax:  [phone] Name: Michaela GHAZARIANMRN: [id number] of Birth: 917-Jan-1982  PHYSICAL THERAPY DISCHARGE SUMMARY  Visits from Start of Care: 17  Current functional level related to goals / functional outcomes: unknown   Remaining deficits: Unknown, patient was returning to MD for assessment and was supposed to call [place] reschedule more visits, She never called.    Education / Equipment: HEP Plan:                                                    Patient goals were partially met. Patient is being discharged  due to not returning since the last visit.  ?????     Jeral Pinch, PT 12/11/17 3:47 PM

## 2017-11-07 ENCOUNTER — Ambulatory Visit (INDEPENDENT_AMBULATORY_CARE_PROVIDER_SITE_OTHER): Payer: 59 | Admitting: Family Medicine

## 2017-11-07 VITALS — BP 145/85 | HR 70 | Temp 97.6°F | Ht 64.0 in | Wt 308.0 lb

## 2017-11-07 DIAGNOSIS — E559 Vitamin D deficiency, unspecified: Secondary | ICD-10-CM

## 2017-11-07 DIAGNOSIS — Z9189 Other specified personal risk factors, not elsewhere classified: Secondary | ICD-10-CM

## 2017-11-07 DIAGNOSIS — E538 Deficiency of other specified B group vitamins: Secondary | ICD-10-CM | POA: Diagnosis not present

## 2017-11-07 DIAGNOSIS — E282 Polycystic ovarian syndrome: Secondary | ICD-10-CM | POA: Diagnosis not present

## 2017-11-07 DIAGNOSIS — Z6841 Body Mass Index (BMI) 40.0 and over, adult: Secondary | ICD-10-CM | POA: Diagnosis not present

## 2017-11-07 DIAGNOSIS — E038 Other specified hypothyroidism: Secondary | ICD-10-CM | POA: Diagnosis not present

## 2017-11-07 MED ORDER — VITAMIN D (ERGOCALCIFEROL) 1.25 MG (50000 UNIT) PO CAPS: 50000.0000 [IU] | ORAL_CAPSULE | ORAL | 0 refills | Status: DC

## 2017-11-07 MED ORDER — CYANOCOBALAMIN ER 2000 MCG PO TBCR: 1.0000 | EXTENDED_RELEASE_TABLET | Freq: Every day | ORAL | 0 refills | Status: DC

## 2017-11-07 MED FILL — VIT D2 1.25 MG (50,000 UNIT: 1.25 MG | 28 days supply | Qty: 4 | Fill #0

## 2017-11-08 NOTE — Progress Notes (Signed)
Office: [phone]  /  Fax: [phone]   HPI:   Chief Complaint: OBESITY Michaela Holland is here to discuss her progress with her obesity treatment plan. She is on the Category 3 plan + 300 calories and is following her eating plan approximately 90 % of the time. She states she is swimming for 35 minutes and walking for 15 minutes 7 times per week. Michaela Holland went out to [employer] 1 time. She doesn't like bread on the meal plan. She is taking 2 oz from dinner and putting it in breakfast.  Her weight is (!) 308 lb (139.7 kg) today and has had a weight loss of 5 pounds over a period of 2 weeks since her last visit. She has lost 5 lbs since starting treatment with [place].  Vitamin D Deficiency Michaela Holland has a diagnosis of vitamin D deficiency. She is on 2,000 IU daily OTC Vit D. She notes fatigue and denies nausea, vomiting or muscle weakness.  At risk for osteopenia and osteoporosis Michaela Holland is at higher risk of osteopenia and osteoporosis due to vitamin D deficiency.   Vitamin B12 Deficiency Michaela Holland has a diagnosis of B12 insufficiency and notes fatigue. She is not on OTC B12 currently. Michaela Holland does not have a history of pernicious anemia or vegetarianism. She does not have a history of weight loss surgery.   Polycystic Ovary Syndrome Michaela Holland has slight insulin resistance, she is on metformin 1,000 mg PO q PM.  Hypothyroidism Michaela Holland has a diagnosis of hypothyroidism. She wants to establish care with new Endocrinologist. TSH within normal limits and she is not on Synthroid currently. She denies hot or cold intolerance or palpitations, but does admit to ongoing fatigue.  ALLERGIES: Allergies  Allergen Reactions  . Sulfa Antibiotics Hives and Shortness Of Breath  . Other     Nuts severe reaction difficulty breathing , throat swelling; mostly tree nuts but not all tree nuts     MEDICATIONS: Current Outpatient Medications on File Prior to Visit  Medication Sig Dispense Refill  . Black Pepper-Turmeric (TURMERIC  CURCUMIN) 01-999 MG CAPS Take 1 capsule by mouth daily.    . citalopram (CELEXA) 10 MG tablet Take 1 tablet (10 mg total) by mouth daily. 30 tablet 2  . clonazePAM (KLONOPIN) 0.5 MG tablet Take 0.5-1 tablets (0.25-0.5 mg total) by mouth 2 (two) times daily as needed for anxiety (#30 for 90 days.). 30 tablet 0  . gabapentin (NEURONTIN) 300 MG capsule One tab PO qHS for a week, then BID for a week, then TID. May double weekly to a max of 3,600mg /day 180 capsule 3  . Glucosamine-Chondroit-Vit C-Mn (GLUCOSAMINE CHONDR 1500 COMPLX PO) Take 1 capsule by mouth daily.    Marland Kitchen ibuprofen (ADVIL,MOTRIN) 200 MG tablet Take 800 mg by mouth every 8 (eight) hours as needed (for pain.).    Marland Kitchen metFORMIN (GLUCOPHAGE-XR) 500 MG 24 hr tablet Take 1 tablet (500 mg total) by mouth at bedtime. (Patient taking differently: Take 1,000 mg by mouth at bedtime. ) 90 tablet 3  . omeprazole (PRILOSEC) 40 MG capsule Take 1 capsule (40 mg total) by mouth daily. (Patient taking differently: Take 40 mg by mouth every evening. ) 30 capsule 3  . ranitidine (ZANTAC) 150 MG tablet Take 150 mg by mouth daily as needed for heartburn.      No current facility-administered medications on file prior to visit.     PAST MEDICAL HISTORY: Past Medical History:  Diagnosis Date  . Anxiety   . Back pain   .  Chronic pain of left knee   . Depression   . Diabetes mellitus without complication (HCC)    denies this diagnosis   . Family history of adverse reaction to anesthesia    per patient ; father was having abdominal surgery and had to be placed on medicine to keep his blood pressure up; he never had any issues with low BP before the surgery "   . Gallbladder problem   . GERD (gastroesophageal reflux disease)   . Gout    believes only occurred once in feet;   . Hypertension    denies this diagnosis   . Hypothyroidism   . Joint pain   . Obesity   . Osteoarthritis   . Palpitations   . PCOS (polycystic ovarian syndrome)   . PONV  (postoperative nausea and vomiting)    with c section; no issues with followwing procedures   . Seasonal allergies   . Seizures (HCC)    has had "pre-seizure" activity in the brain but deneis any actual seizures   . Sleep apnea    does not currently use due to insurance   . Vitamin D deficiency     PAST SURGICAL HISTORY: Past Surgical History:  Procedure Laterality Date  . CESAREAN SECTION    . CHOLECYSTECTOMY    . ESOPHAGOGASTRODUODENOSCOPY  2001 and 2018  . GANGLION CYST EXCISION Left 2014   foot  . KNEE ARTHROSCOPY Left 06/28/2017   Procedure: LEFT KNEE ARTHROSCOPY with debridement;  Surgeon: Kathryne HitchBlackman, Christopher Y, MD;  Location: [facility];  Service: Orthopedics;  Laterality: Left;    SOCIAL HISTORY: Social History   Tobacco Use  . Smoking status: Never Smoker  . Smokeless tobacco: Never Used  Substance Use Topics  . Alcohol use: Yes    Comment: occasioanlly   . Drug use: No    FAMILY HISTORY: Family History  Problem Relation Age of Onset  . Polycystic ovary syndrome Mother   . Hypothyroidism Mother   . Hyperlipidemia Mother   . Depression Mother   . Obesity Mother   . Polycystic ovary syndrome Sister   . Hypothyroidism Sister   . Hypertension Father   . Hyperlipidemia Father   . Cancer Father   . Depression Father   . Anxiety disorder Father   . Obesity Father     ROS: Review of Systems  Constitutional: Positive for malaise/fatigue and weight loss.       Negative hot/cold intolerance  Cardiovascular: Negative for palpitations.  Gastrointestinal: Negative for nausea and vomiting.  Musculoskeletal:       Negative muscle weakness    PHYSICAL EXAM: Blood pressure (!) 145/85, pulse 70, temperature 97.6 F (36.4 C), temperature source Oral, height 5\' 4"  (1.626 m), weight (!) 308 lb (139.7 kg), SpO2 100 %. Body mass index is 52.87 kg/m. Physical Exam  Constitutional: She is oriented to person, place, and time. She appears well-developed and  well-nourished.  Cardiovascular: Normal rate.  Pulmonary/Chest: Effort normal.  Musculoskeletal: Normal range of motion.  Neurological: She is oriented to person, place, and time.  Skin: Skin is warm and dry.  Psychiatric: She has a normal mood and affect. Her behavior is normal.  Vitals reviewed.   RECENT LABS AND TESTS: BMET    Component Value Date/Time   NA 138 10/22/2017 1236   K 4.3 10/22/2017 1236   CL 100 10/22/2017 1236   CO2 21 10/22/2017 1236   GLUCOSE 81 10/22/2017 1236   GLUCOSE 92 07/11/2017 1204   BUN 10  10/22/2017 1236   CREATININE 0.55 (L) 10/22/2017 1236   CALCIUM 9.1 10/22/2017 1236   GFRNONAA 121 10/22/2017 1236   GFRAA 140 10/22/2017 1236   Lab Results  Component Value Date   HGBA1C 4.9 10/22/2017   HGBA1C 5.1 07/11/2017   Lab Results  Component Value Date   INSULIN 8.8 10/22/2017   CBC    Component Value Date/Time   WBC 9.5 10/22/2017 1236   WBC 8.4 06/25/2017 1036   RBC 4.52 10/22/2017 1236   RBC 4.78 06/25/2017 1036   HGB 12.8 10/22/2017 1236   HCT 37.8 10/22/2017 1236   PLT 327 06/25/2017 1036   MCV 84 10/22/2017 1236   MCH 28.3 10/22/2017 1236   MCH 27.6 06/25/2017 1036   MCHC 33.9 10/22/2017 1236   MCHC 32.0 06/25/2017 1036   RDW 13.5 10/22/2017 1236   LYMPHSABS 1.7 10/22/2017 1236   EOSABS 0.2 10/22/2017 1236   BASOSABS 0.0 10/22/2017 1236   Iron/TIBC/Ferritin/ %Sat No results found for: IRON, TIBC, FERRITIN, IRONPCTSAT Lipid Panel     Component Value Date/Time   CHOL 182 10/22/2017 1236   TRIG 117 10/22/2017 1236   HDL 54 10/22/2017 1236   LDLCALC 105 (H) 10/22/2017 1236   Hepatic Function Panel     Component Value Date/Time   PROT 6.8 10/22/2017 1236   ALBUMIN 4.1 10/22/2017 1236   AST 13 10/22/2017 1236   ALT 11 10/22/2017 1236   ALKPHOS 61 10/22/2017 1236   BILITOT 0.5 10/22/2017 1236      Component Value Date/Time   TSH 2.950 10/22/2017 1236   TSH 1.94 07/11/2017 1204  Results for HERMIE, REAGOR (MRN  [id number]) as of 11/08/2017 11:01  Ref. Range 10/22/2017 12:36  Vitamin D, 25-Hydroxy Latest Ref Range: 30.0 - 100.0 ng/mL 14.0 (L)    ASSESSMENT AND PLAN: Vitamin D deficiency - Plan: Vitamin D, Ergocalciferol, (DRISDOL) 50000 units CAPS capsule  Vitamin B12 deficiency - Plan: Cyanocobalamin 2000 MCG TBCR  PCOS (polycystic ovarian syndrome)  Other specified hypothyroidism  At risk for osteopenia  Class 3 severe obesity with serious comorbidity and body mass index (BMI) of 50.0 to 59.9 in adult, unspecified obesity type (HCC)  PLAN:  Vitamin D Deficiency Mannie was informed that low vitamin D levels contributes to fatigue and are associated with obesity, breast, and colon cancer. Micaila agrees to start prescription Vit D @50 ,000 IU every week #4 with no refills and can hold OTC. She will follow up for routine testing of vitamin D, at least 2-3 times per year. She was informed of the risk of over-replacement of vitamin D and agrees to not increase her dose unless she discusses this with [place] first. Michaela Holland agrees to follow up with our clinic in 2 weeks.  At risk for osteopenia and osteoporosis Michaela Holland is at risk for osteopenia and osteoporsis due to her vitamin D deficiency. She was encouraged to take her vitamin D and follow her higher calcium diet and increase strengthening exercise to help strengthen her bones and decrease her risk of osteopenia and osteoporosis.  Vitamin B12 Deficiency Michaela Holland will work on increasing B12 rich foods in her diet. Michaela Holland agrees to start Vit B12 2,000 mcg PO daily #30 with no refills and she agrees to follow up with our clinic in 2 weeks.  Polycystic Ovary Syndrome Michaela Holland agrees to start metformin 500 mg PO q AM and continue metformin 1,000 mg PO q PM. Michaela Holland agrees to follow up with our clinic in 2 weeks.  Hypothyroidism Michaela Holland  was informed of the importance of good thyroid control to help with weight loss efforts. She was also informed that supertheraputic thyroid levels are  dangerous and will not improve weight loss results. Michaela Holland is to establish care with new primary care physician.  Obesity Michaela Holland is currently in the action stage of change. As such, her goal is to continue with weight loss efforts She has agreed to keep a food journal with 450-600 calories and 40 grams of protein  and follow the Category 3 plan.  She is looking for more options at dinner as her husband likes to cook and therefore requested that she have other options besides the category meal plan for dinner. Favor has been instructed to work up to a goal of 150 minutes of combined cardio and strengthening exercise per week for weight loss and overall health benefits. We discussed the following Behavioral Modification Strategies today: increasing lean protein intake, work on meal planning and easy cooking plans, increase H20 intake, keeping healthy foods in the home, better snacking choices, and planning for success   Michaela Holland has agreed to follow up with our clinic in 2 weeks. She was informed of the importance of frequent follow up visits to maximize her success with intensive lifestyle modifications for her multiple health conditions.   OBESITY BEHAVIORAL INTERVENTION VISIT  Today's visit was # 2 out of 22.  Starting weight: 313 lbs Starting date: 10/22/16 Today's weight : 308 lbs  Today's date: 11/07/2017 Total lbs lost to date: 5 (Patients must lose 7 lbs in the first 6 months to continue with counseling)   ASK: We discussed the diagnosis of obesity with Michaela Holland today and Michaela Holland agreed to give [place] permission to discuss obesity behavioral modification therapy today.  ASSESS: Michaela Holland has the diagnosis of obesity and her BMI today is 52.84 Michaela Holland is in the action stage of change   ADVISE: Michaela Holland was educated on the multiple health risks of obesity as well as the benefit of weight loss to improve her health. She was advised of the need for long term treatment and the importance of lifestyle  modifications.  AGREE: Multiple dietary modification options and treatment options were discussed and  Namine agreed to the above obesity treatment plan.  I, Burt Knack, am acting as transcriptionist for Michaela Riding, MD  I have reviewed the above documentation for accuracy and completeness, and I agree with the above. - Michaela Riding, MD

## 2017-11-12 ENCOUNTER — Encounter (INDEPENDENT_AMBULATORY_CARE_PROVIDER_SITE_OTHER): Payer: Self-pay | Admitting: Family Medicine

## 2017-11-13 ENCOUNTER — Encounter (INDEPENDENT_AMBULATORY_CARE_PROVIDER_SITE_OTHER): Payer: Self-pay | Admitting: Physician Assistant

## 2017-11-13 ENCOUNTER — Ambulatory Visit (INDEPENDENT_AMBULATORY_CARE_PROVIDER_SITE_OTHER): Payer: 59 | Admitting: Physician Assistant

## 2017-11-13 DIAGNOSIS — Z9889 Other specified postprocedural states: Secondary | ICD-10-CM | POA: Diagnosis not present

## 2017-11-13 MED ORDER — DICLOFENAC SODIUM 75 MG PO TBEC
75.0000 mg | DELAYED_RELEASE_TABLET | Freq: Two times a day (BID) | ORAL | 1 refills | Status: DC
Start: 2017-11-13 — End: 2018-03-06

## 2017-11-13 MED FILL — DICLOFENAC SOD EC 75 MG TAB: 75 | 30 days supply | Qty: 60 | Fill #0

## 2017-11-13 NOTE — Progress Notes (Signed)
Office Visit Note   Patient: Michaela Holland           Date of Birth: 03/06/1981           MRN: [id number] Visit Date: 11/13/2017              Requested by: Sunnie Nielsen, DO [address] [place], [place] [address] PCP: Sunnie Nielsen, DO   Assessment & Plan: Visit Diagnoses: No diagnosis found.  Plan: Place her on diclofenac 75 mg 1 p.o. twice daily stop all NSAIDs.  We have sent her for a open patella brace that will have to be custom fitted due to the shape of her distal thigh.  She will continue to work with therapy on range of motion strengthening knee.  She is out of work until follow-up in 6 weeks and will reevaluate her work status at that point.  Follow-Up Instructions: Return in about 6 weeks (around 12/25/2017).   Orders:  No orders of the defined types were placed in this encounter.  Meds ordered this encounter  Medications  . diclofenac (VOLTAREN) 75 MG EC tablet    Sig: Take 1 tablet (75 mg total) by mouth 2 (two) times daily.    Dispense:  60 tablet    Refill:  1      Procedures: No procedures performed   Clinical Data: No additional findings.   Subjective: Chief Complaint  Patient presents with  . Left Knee - Follow-up    HPI Michaela Holland returns today follow-up of her left knee status post left knee arthroscopy 5 months ago.  She states she continues to have pain in the knee with flares.  She states the knee is slightly better.  But whenever she has a flare it is "debilitating for days".  States she had a great week 2 weeks ago and then went to physical therapy was sore after that did some shopping and went swimming for 20 minutes then she had increased knee pain for the next 3 days that was extreme she had difficulty even ambulating due to the pain.  Last week she went easier with physical therapy and this week has been okay with some discomfort in the knee.  She is swimming and using a biking station.  She is tried some massage  therapy on the leg.  She is taking turmeric and glucosamine.  Using ibuprofen 800 mg twice daily to 3 times daily as needed.  She did try some taping with physical therapy this caused her to break out therefore she is wondering if there is some type brace that she could use for the knee.  She notes that if she walks greater than 10 minutes he starts to really hurt and then begins to buckle.  She is working on her weight at the [employer] center. Review of Systems Please see HPI otherwise negative  Objective: Vital Signs: There were no vitals taken for this visit.  Physical Exam  Constitutional: She is oriented to person, place, and time. She appears well-developed and well-nourished. No distress.  Pulmonary/Chest: Effort normal.  Neurological: She is alert and oriented to person, place, and time.  Skin: She is not diaphoretic.  Psychiatric: She has a normal mood and affect.    Ortho Exam Left knee good range of motion.  Port sites are all well-healed.  She has some slight tenderness medial joint line.  Some tenderness over the pes anserinus region.  There is no abnormal warmth erythema or effusion  of the left knee. Specialty Comments:  No specialty comments available.  Imaging: No results found.   PMFS History: Patient Active Problem List   Diagnosis Date Noted  . Other fatigue 10/22/2017  . Shortness of breath on exertion 10/22/2017  . Vitamin D deficiency 10/22/2017  . S/P arthroscopy of left knee 07/18/2017  . Acute pain of left knee 06/17/2017  . Meniscal cyst, left 06/17/2017  . Cyst of lateral meniscus of left knee 05/08/2017  . Anterior cruciate ligament sprain 05/08/2017  . Morbid obesity (HCC) 05/08/2017  . History of obstructive sleep apnea 04/09/2017  . Hypothyroidism 04/09/2017  . PCOS (polycystic ovarian syndrome) 04/09/2017  . Anxiety 04/09/2017  . Chronic pain of left knee 04/09/2017  . Gastroesophageal reflux disease with esophagitis 04/09/2017   Past  Medical History:  Diagnosis Date  . Anxiety   . Back pain   . Chronic pain of left knee   . Depression   . Diabetes mellitus without complication (HCC)    denies this diagnosis   . Family history of adverse reaction to anesthesia    per patient ; father was having abdominal surgery and had to be placed on medicine to keep his blood pressure up; he never had any issues with low BP before the surgery "   . Gallbladder problem   . GERD (gastroesophageal reflux disease)   . Gout    believes only occurred once in feet;   . Hypertension    denies this diagnosis   . Hypothyroidism   . Joint pain   . Obesity   . Osteoarthritis   . Palpitations   . PCOS (polycystic ovarian syndrome)   . PONV (postoperative nausea and vomiting)    with c section; no issues with followwing procedures   . Seasonal allergies   . Seizures (HCC)    has had "pre-seizure" activity in the brain but deneis any actual seizures   . Sleep apnea    does not currently use due to insurance   . Vitamin D deficiency     Family History  Problem Relation Age of Onset  . Polycystic ovary syndrome Mother   . Hypothyroidism Mother   . Hyperlipidemia Mother   . Depression Mother   . Obesity Mother   . Polycystic ovary syndrome Sister   . Hypothyroidism Sister   . Hypertension Father   . Hyperlipidemia Father   . Cancer Father   . Depression Father   . Anxiety disorder Father   . Obesity Father     Past Surgical History:  Procedure Laterality Date  . CESAREAN SECTION    . CHOLECYSTECTOMY    . ESOPHAGOGASTRODUODENOSCOPY  2001 and 2018  . GANGLION CYST EXCISION Left 2014   foot  . KNEE ARTHROSCOPY Left 06/28/2017   Procedure: LEFT KNEE ARTHROSCOPY with debridement;  Surgeon: Kathryne HitchBlackman, Christopher Y, MD;  Location: [facility];  Service: Orthopedics;  Laterality: Left;   Social History   Occupational History  . Occupation: Designer, jewelleryegistered Nurse  Tobacco Use  . Smoking status: Never Smoker  . Smokeless tobacco: Never  Used  Substance and Sexual Activity  . Alcohol use: Yes    Comment: occasioanlly   . Drug use: No  . Sexual activity: Yes    Birth control/protection: None

## 2017-11-21 ENCOUNTER — Ambulatory Visit (INDEPENDENT_AMBULATORY_CARE_PROVIDER_SITE_OTHER): Payer: 59 | Admitting: Family Medicine

## 2017-11-21 ENCOUNTER — Ambulatory Visit (INDEPENDENT_AMBULATORY_CARE_PROVIDER_SITE_OTHER): Payer: 59 | Admitting: Physical Therapy

## 2017-11-21 VITALS — BP 136/86 | HR 80 | Temp 98.3°F | Ht 64.0 in | Wt 306.0 lb

## 2017-11-21 DIAGNOSIS — Z6841 Body Mass Index (BMI) 40.0 and over, adult: Secondary | ICD-10-CM

## 2017-11-21 DIAGNOSIS — R293 Abnormal posture: Secondary | ICD-10-CM

## 2017-11-21 DIAGNOSIS — G8929 Other chronic pain: Secondary | ICD-10-CM | POA: Diagnosis not present

## 2017-11-21 DIAGNOSIS — M6281 Muscle weakness (generalized): Secondary | ICD-10-CM

## 2017-11-21 DIAGNOSIS — M25562 Pain in left knee: Secondary | ICD-10-CM | POA: Diagnosis not present

## 2017-11-21 DIAGNOSIS — E282 Polycystic ovarian syndrome: Secondary | ICD-10-CM

## 2017-11-21 DIAGNOSIS — E559 Vitamin D deficiency, unspecified: Secondary | ICD-10-CM

## 2017-11-21 DIAGNOSIS — R2689 Other abnormalities of gait and mobility: Secondary | ICD-10-CM | POA: Diagnosis not present

## 2017-11-21 DIAGNOSIS — E538 Deficiency of other specified B group vitamins: Secondary | ICD-10-CM

## 2017-11-21 NOTE — Therapy (Addendum)
[place], [place], [address] Phone: [phone]   Fax:  [phone]  Physical Therapy Treatment  Patient Details  Name: Michaela Holland MRN: [id number] Date of Birth: 1981-09-08 Referring Provider: Dr. Jean Rosenthal   Encounter Date: 11/21/2017  PT End of Session - 11/21/17 1246    Visit Number  18    Number of Visits  32    Date for PT Re-Evaluation  12/11/17    PT Start Time  1150    PT Stop Time  1246    PT Time Calculation (min)  56 min       Past Medical History:  Diagnosis Date  . Anxiety   . Back pain   . Chronic pain of left knee   . Depression   . Diabetes mellitus without complication ([place])    denies this diagnosis   . Family history of adverse reaction to anesthesia    per patient ; father was having abdominal surgery and had to be placed on medicine to keep his blood pressure up; he never had any issues with low BP before the surgery "   . Gallbladder problem   . GERD (gastroesophageal reflux disease)   . Gout    believes only occurred once in feet;   . Hypertension    denies this diagnosis   . Hypothyroidism   . Joint pain   . Obesity   . Osteoarthritis   . Palpitations   . PCOS (polycystic ovarian syndrome)   . PONV (postoperative nausea and vomiting)    with c section; no issues with followwing procedures   . Seasonal allergies   . Seizures ([place])    has had "pre-seizure" activity in the brain but deneis any actual seizures   . Sleep apnea    does not currently use due to insurance   . Vitamin D deficiency     Past Surgical History:  Procedure Laterality Date  . CESAREAN SECTION    . CHOLECYSTECTOMY    . ESOPHAGOGASTRODUODENOSCOPY  2001 and 2018  . GANGLION CYST EXCISION Left 2014   foot  . KNEE ARTHROSCOPY Left 06/28/2017   Procedure: LEFT KNEE ARTHROSCOPY with debridement;  Surgeon: Mcarthur Rossetti, MD;  Location: [facility];  Service: Orthopedics;   Laterality: Left;    There were no vitals filed for this visit.  Subjective Assessment - 11/21/17 1310    Subjective  [name] reports she had follow up at surgeon's office.  She is now taking new oral anti-inflammatory, which is helping with pain, allowing her to move more.  She continues to use pool and now stationary bicycle at her apt complex.  She has financial concerns with continuation of therapy and would like to discuss transitioning to home program to continue strengthening.     Patient Stated Goals  to help increase strength and endurance     Currently in Pain?  Yes    Pain Score  2     Pain Location  Knee    Pain Orientation  Left    Pain Descriptors / Indicators  Aching    Aggravating Factors   hyperextending knee, prolonged standing    Pain Relieving Factors  ice, stretches.          [facility] PT Assessment - 11/21/17 0001      Assessment   Medical Diagnosis  Lt knee scope    Referring Provider  Dr. Jean Rosenthal    Onset Date/Surgical Date  06/28/17    Hand Dominance  Right      Strength   Left Hip Flexion  5/5    Left Hip Extension  5/5    Left Hip ABduction  5/5    Left Knee Flexion  5/5      Flexibility   Hamstrings  85 deg LLE    Quadriceps  LLE: 115 deg; RLE:          [facility] Adult PT Treatment/Exercise - 11/21/17 0001      Knee/Hip Exercises: Stretches   Passive Hamstring Stretch  Left;Right;2 reps;30 seconds    Quad Stretch  Left;Right;2 reps;60 seconds prone quad stretch    Gastroc Stretch  Left;Right;2 reps;30 seconds during hamstring stretch       Knee/Hip Exercises: Aerobic   Recumbent Bike  L1: 4 min       Knee/Hip Exercises: Machines for Strengthening   Total Gym Leg Press  10 reps at 7 plates. Heel raises x 10 reps, 7 plates.       Knee/Hip Exercises: Standing   SLS  SLS on Rt leg with toe taps front/side/back (for demo of LLE exercise in pool)     Other Standing Knee Exercises  reviewed balance exercises in current HEP (tandem stance,  SLS - in water, warrior 2 pose)      Knee/Hip Exercises: Sidelying   Other Sidelying Knee/Hip Exercises  pilates leg circles x 10 reps CW/ CCW each leg.       Knee/Hip Exercises: Prone   Hamstring Curl  -- reviewed appropriate wt for HEP      Moist Heat Therapy   Number Minutes Moist Heat  15 Minutes    Moist Heat Location  Knee Lt posterior      Cryotherapy   Number Minutes Cryotherapy  15 Minutes    Cryotherapy Location  Knee Lt    Type of Cryotherapy  Ice pack      Manual Therapy   Manual therapy comments  Discussed method to wrap knee with 4" ace wrap for support and prevent hyperextension.  Pt verbalized understanding.              PT Education - 11/21/17 1309    Education provided  Yes    Education Details  HEP    Person(s) Educated  Patient    Methods  Explanation;Handout;Verbal cues;Tactile cues;Demonstration    Comprehension  Verbalized understanding;Returned demonstration          PT Long Term Goals - 11/21/17 1257      PT LONG TERM GOAL #1   Title  I with advanced HEP (12/11/17)    Time  18    Period  Weeks    Status  On-going      PT LONG TERM GOAL #2   Title  Increase Lt knee and hip strength =/> 5-/5 (12/11/17)    Time  18    Period  Weeks    Status  Achieved      PT LONG TERM GOAL #3   Title  Patient to report =/> 75% reduction in Lt knee pain (12/11/17)     Time  18    Period  Weeks    Status  Partially Met pt reports 60% reduction (without medicine) and 75% reduction in pain (with medicine)      PT LONG TERM GOAL #4   Title  Patient to demonstrate normalized gait pattern on even surfaces without assistive device  (12/11/17)     Time  18  Period  Weeks    Status  Partially Met varies, dependent on pain level      PT LONG TERM GOAL #5   Title  improve FOTO =/< 46% limited (12/11/17)     Time  18    Period  Weeks    Status  On-going      PT LONG TERM GOAL #6   Title  Patient to tolerate walking for 30-60 min with minimal to no increase in  knee pain (12/11/17)    Time  12    Period  Weeks    Status  On-going improving            Plan - 11/21/17 1313    Clinical Impression Statement  Pt tolerated all exercises well.  Reviewed current HEP and added supplimental exercises to it today.  Her quad flexibility has improved, as well as her strength.  She has partially met her goals and is making gradual, positive gains towards remaining goals.      PT Frequency  2x / week    PT Duration  6 weeks    PT Treatment/Interventions  Patient/family education;ADLs/Self Care Home Management;Cryotherapy;Electrical Stimulation;Moist Heat;Iontophoresis 14m/ml Dexamethasone;Ultrasound;Gait training;Functional mobility training;Therapeutic activities;Therapeutic exercise;Dry needling;Manual techniques    PT Next Visit Plan  Spoke to supervising PT; will hold therapy for 2-3 wks while pt continues to work on [employer]  If pt doesn't return by 4/3; will d/c.     Consulted and Agree with Plan of Care  Patient       Patient will benefit from skilled therapeutic intervention in order to improve the following deficits and impairments:  Abnormal gait, Decreased activity tolerance, Decreased balance, Postural dysfunction, Improper body mechanics, Decreased range of motion, Decreased strength  Visit Diagnosis: Chronic pain of left knee  Muscle weakness (generalized)  Abnormal posture  Other abnormalities of gait and mobility     Problem List Patient Active Problem List   Diagnosis Date Noted  . Other fatigue 10/22/2017  . Shortness of breath on exertion 10/22/2017  . Vitamin D deficiency 10/22/2017  . S/P arthroscopy of left knee 07/18/2017  . Acute pain of left knee 06/17/2017  . Meniscal cyst, left 06/17/2017  . Cyst of lateral meniscus of left knee 05/08/2017  . Anterior cruciate ligament sprain 05/08/2017  . Morbid obesity ([place] 05/08/2017  . History of obstructive sleep apnea 04/09/2017  . Hypothyroidism 04/09/2017  . PCOS (polycystic  ovarian syndrome) 04/09/2017  . Anxiety 04/09/2017  . Chronic pain of left knee 04/09/2017  . Gastroesophageal reflux disease with esophagitis 04/09/2017   JKerin Perna PTA 11/21/17 1:33 PM  [place] [address] [address]Phone: [phone]  Fax:  [phone] Name: AROSELANI GRAJEDAMRN: [id number] of Birth: [birth year] PHYSICAL THERAPY DISCHARGE SUMMARY  Visits from Start of Care: 18  Current functional level related to goals / functional outcomes: See progress note for discharge status   Remaining deficits: Continued intermittent pain and limitations   Education / Equipment: HEP; community pool program  Plan: Patient agrees to discharge.  Patient goals were partially met. Patient is being discharged due to                                                     ?????   Patient  continues to work on independent HEP and community based pool program.    Celyn P. Helene Kelp PT, MPH 12/18/17 3:06 PM

## 2017-11-21 NOTE — Patient Instructions (Addendum)
Side Leg Circle    Lie on side, back straight along edge of mat, legs 30 in front of torso. Lift top leg to hip height. Rotate in small circle, __10_ times in each direction. Repeat __1-2__ times. Repeat on other side. Do __3_ sessions per week .  [place]: Beginner    Start with feet flat. Lift head and hold. Pump hands slightly up and down. Breathe in for count of _4-5__. Breathe out for count of _4-5_. Pump 25 times until you can work up to 100,  Heel Raise: Sitting (Machine)    Ankles flexed and calves stretched, press toes forward as far as possible. Do __1-2 sets. Complete __10__ repetitions.   Leg Press (Machine)     Balance: Three-Way toe taps (perform IN WATER)   , Stand on left foot, hands on hips. Reach other foot forward, sideways , back __10-__ times.   Press forward until knees are just short of locked position.Do __1-2_ sets. Complete _10___ repetitions.     Make a checklist for home to track your progress.  * cardio (bike, swim, walk) everyday.  * stretch legs (calves, quads, hamstrings, hips) 2 reps, 2x/day.  * strengthening of legs - 3 x per week.

## 2017-11-22 ENCOUNTER — Encounter (HOSPITAL_COMMUNITY): Payer: Self-pay | Admitting: Psychiatry

## 2017-11-22 ENCOUNTER — Ambulatory Visit (INDEPENDENT_AMBULATORY_CARE_PROVIDER_SITE_OTHER): Payer: 59 | Admitting: Psychiatry

## 2017-11-22 ENCOUNTER — Telehealth: Payer: Self-pay

## 2017-11-22 DIAGNOSIS — F419 Anxiety disorder, unspecified: Secondary | ICD-10-CM

## 2017-11-22 DIAGNOSIS — Z818 Family history of other mental and behavioral disorders: Secondary | ICD-10-CM

## 2017-11-22 DIAGNOSIS — F411 Generalized anxiety disorder: Secondary | ICD-10-CM

## 2017-11-22 DIAGNOSIS — F331 Major depressive disorder, recurrent, moderate: Secondary | ICD-10-CM

## 2017-11-22 MED ORDER — CLONAZEPAM 0.5 MG PO TABS: 0.2500 mg | ORAL_TABLET | Freq: Two times a day (BID) | ORAL | 0 refills | Status: DC | PRN

## 2017-11-22 MED ORDER — CITALOPRAM HYDROBROMIDE 20 MG PO TABS: 20.0000 mg | ORAL_TABLET | Freq: Every day | ORAL | 0 refills | Status: DC

## 2017-11-22 MED FILL — clonazePAM 0.5 MG TABS: 0.5 | 90 days supply | Qty: 30 | Fill #0

## 2017-11-22 MED FILL — CITALOPRAM HBR 20 MG TABLET: 20 | 90 days supply | Qty: 90 | Fill #0

## 2017-11-22 NOTE — Telephone Encounter (Signed)
[facility] requesting med refill for clonazepam 0.5 mg. Thanks.

## 2017-11-22 NOTE — Progress Notes (Signed)
[facility] MD/PA/NP OP Progress Note  11/22/2017 11:34 AM Michaela Holland  MRN:  [id number]  Chief Complaint: med management, anxiety HPI: Michaela Holland reports Celexa has been well tolerated.  Anxiety and depressive symptoms under much better control.  A significant factor has been that she is no longer working due to her knee injury, and she is on FMLA.  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  She is realized that the stress and anxiety of making decisions that affect people's lives more directly, and seeing suffering more directly has been quite difficult for her to endure.  She enjoys working as a Administrator, Civil Service with other providers, but feels overwhelmed at times with the magnitude of decisions and clinical care.  I expressed my support for this, about her recognizing and respecting her own boundaries as an individual and as a Radiographer, therapeutic.  We agreed to continue Celexa 20 mg daily and I will refill clonazepam, although she shares the good news that she has not needed to use clonazepam in nearly 2 months.  Visit Diagnosis:    ICD-10-CM   1. Moderate episode of recurrent major depressive disorder (HCC) F33.1   2. Anxiety F41.9 clonazePAM (KLONOPIN) 0.5 MG tablet   Refill of clonazepam for now, defer further management to psychiatry  3. Generalized anxiety disorder F41.1     Past Psychiatric History: See intake H&P for full details. Reviewed, with no updates at this time.   Past Medical History:  Past Medical History:  Diagnosis Date  . Anxiety   . Back pain   . Chronic pain of left knee   . Depression   . Diabetes mellitus without complication (H[facility])    denies this diagnosis   . Family history of adverse reaction to anesthesia    per patient ; father was having abdominal surgery and had to be placed on medicine to keep his blood  pressure up; he never had any issues with low BP before the surgery "   . Gallbladder problem   . GERD (gastroesophageal reflux disease)   . Gout    believes only occurred once in feet;   . Hypertension    denies this diagnosis   . Hypothyroidism   . Joint pain   . Obesity   . Osteoarthritis   . Palpitations   . PCOS (polycystic ovarian syndrome)   . PONV (postoperative nausea and vomiting)    with c section; no issues with followwing procedures   . Seasonal allergies   . Seizures ([facility])    has had "pre-seizure" activity in the brain but deneis any actual seizures   . Sleep apnea    does not currently use due to insurance   . Vitamin D deficiency     Past Surgical History:  Procedure Laterality Date  . CESAREAN SECTION    . CHOLECYSTECTOMY    . ESOPHAGOGASTRODUODENOSCOPY  2001 and 2018  . GANGLION CYST EXCISION Left 2014   foot  . KNEE ARTHROSCOPY Left 06/28/2017   Procedure: LEFT KNEE ARTHROSCOPY with debridement;  Surgeon: Kathryne Hitch, MD;  Location: [facility];  Service: Orthopedics;  Laterality: Left;    Family Psychiatric History: See intake H&P for full details. Reviewed, with no updates at this time.   Family History:  Family History  Problem Relation Age of Onset  . Polycystic ovary syndrome Mother   .  Hypothyroidism Mother   . Hyperlipidemia Mother   . Depression Mother   . Obesity Mother   . Polycystic ovary syndrome Sister   . Hypothyroidism Sister   . Hypertension Father   . Hyperlipidemia Father   . Cancer Father   . Depression Father   . Anxiety disorder Father   . Obesity Father     Social History:  Social History   Socioeconomic History  . Marital status: Married    Spouse name: Ree Kida Alcott  . Number of children: 1  . Years of education: Not on file  . Highest education level: Not on file  Social Needs  . Financial resource strain: Not on file  . Food insecurity - worry: Not on file  . Food insecurity - inability: Not on file  .  Transportation needs - medical: Not on file  . Transportation needs - non-medical: Not on file  Occupational History  . Occupation: Designer, jewellery  Tobacco Use  . Smoking status: Never Smoker  . Smokeless tobacco: Never Used  Substance and Sexual Activity  . Alcohol use: Yes    Comment: occasioanlly   . Drug use: No  . Sexual activity: Yes    Birth control/protection: None  Other Topics Concern  . Not on file  Social History Narrative  . Not on file    Allergies:  Allergies  Allergen Reactions  . Sulfa Antibiotics Hives and Shortness Of Breath  . Other     Nuts severe reaction difficulty breathing , throat swelling; mostly tree nuts but not all tree nuts     Metabolic Disorder Labs: Lab Results  Component Value Date   HGBA1C 4.9 10/22/2017   Lab Results  Component Value Date   PROLACTIN 5.2 07/11/2017   Lab Results  Component Value Date   CHOL 182 10/22/2017   TRIG 117 10/22/2017   HDL 54 10/22/2017   LDLCALC 105 (H) 10/22/2017   Lab Results  Component Value Date   TSH 2.950 10/22/2017   TSH 1.94 07/11/2017    Therapeutic Level Labs: No results found for: LITHIUM No results found for: VALPROATE No components found for:  CBMZ  Current Medications: Current Outpatient Medications  Medication Sig Dispense Refill  . Black Pepper-Turmeric (TURMERIC CURCUMIN) 01-999 MG CAPS Take 1 capsule by mouth daily.    . citalopram (CELEXA) 20 MG tablet Take 1 tablet (20 mg total) by mouth daily. 90 tablet 0  . clonazePAM (KLONOPIN) 0.5 MG tablet Take 0.5-1 tablets (0.25-0.5 mg total) by mouth 2 (two) times daily as needed for anxiety (#30 for 90 days.). 30 tablet 0  . Cyanocobalamin 2000 MCG TBCR Take 1 tablet (2,000 mcg total) by mouth daily. 30 tablet 0  . diclofenac (VOLTAREN) 75 MG EC tablet Take 1 tablet (75 mg total) by mouth 2 (two) times daily. 60 tablet 1  . gabapentin (NEURONTIN) 300 MG capsule One tab PO qHS for a week, then BID for a week, then TID. May  double weekly to a max of 3,600mg /day 180 capsule 3  . Glucosamine-Chondroit-Vit C-Mn (GLUCOSAMINE CHONDR 1500 COMPLX PO) Take 1 capsule by mouth daily.    Marland Kitchen ibuprofen (ADVIL,MOTRIN) 200 MG tablet Take 800 mg by mouth every 8 (eight) hours as needed (for pain.).    Marland Kitchen metFORMIN (GLUCOPHAGE-XR) 500 MG 24 hr tablet Take 1 tablet (500 mg total) by mouth at bedtime. (Patient taking differently: Take 1,000 mg by mouth at bedtime. ) 90 tablet 3  . omeprazole (PRILOSEC) 40 MG capsule Take  1 capsule (40 mg total) by mouth daily. (Patient taking differently: Take 40 mg by mouth every evening. ) 30 capsule 3  . ranitidine (ZANTAC) 150 MG tablet Take 150 mg by mouth daily as needed for heartburn.     . Vitamin D, Ergocalciferol, (DRISDOL) 50000 units CAPS capsule Take 1 capsule (50,000 Units total) by mouth every 7 (seven) days. 4 capsule 0   No current facility-administered medications for this visit.     Musculoskeletal: Strength & Muscle Tone: within normal limits Gait & Station: normal Patient leans: N/A  Psychiatric Specialty Exam: ROS  There were no vitals taken for this visit.There is no height or weight on file to calculate BMI.  General Appearance: Casual and Well Groomed  Eye Contact:  Good  Speech:  Clear and Coherent and Normal Rate  Volume:  Normal  Mood:  Euthymic  Affect:  Congruent  Thought Process:  Coherent and Descriptions of Associations: Intact  Orientation:  Full (Time, Place, and Person)  Thought Content: Logical   Suicidal Thoughts:  No  Homicidal Thoughts:  No  Memory:  Immediate;   Good  Judgement:  Good  Insight:  Good  Psychomotor Activity:  Normal  Concentration:  Concentration: Good  Recall:  Good  Fund of Knowledge: Good  Language: Good  Akathisia:  Negative  Handed:  Right  AIMS (if indicated): not done  Assets:  Communication Skills Desire for Improvement Financial Resources/Insurance Housing Intimacy  ADL's:  Intact  Cognition: WNL  Sleep:  Good    Screenings: PHQ2-9     [facility] Visit from 05/08/2017 in [facility] [place] Office Visit from 04/09/2017 in [facility]   PHQ-2 Total Score  6  2  2   PHQ-9 Total Score  19  No data  No data       Assessment and Plan:  Cheri Guppynne K Wilden presents with improvement of anxiety and depressive symptoms on Celexa 10 mg.  No significant intolerance.  We will increase Celexa to 20 mg daily for a maintenance dose.  No acute safety issues, reports that things at home have been much more stable with her anxiety and burnout being reduced.  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  We will proceed as below and follow-up in 3 months.  1. Moderate episode of recurrent major depressive disorder (HCC)   2. Anxiety   3. Generalized anxiety disorder     Status of current problems: rapidly improving  Labs Ordered: No orders of the defined types were placed in this encounter.  Labs Reviewed: vitamin d low -being treated  Collateral Obtained/Records Reviewed: n/a  Plan:  Celexa increased to 20 mg daily Clonazepam 0.25 or 0.5 mg as needed 1-2 times daily for anxiety; patient has not used this in 2 months rtc 3 months I support the patient in working with her employers to restructure her job to reduce her anxiety and level of burnout, and this would likely entail her having less direct clinical contact  I spent 20 minutes with the patient in direct face-to-face clinical care.  Greater than 50% of this time was spent in counseling and coordination of care with the patient.    Burnard LeighAlexander Arya Auron Tadros, MD 11/22/2017, 11:34 AM

## 2017-11-25 NOTE — Telephone Encounter (Signed)
Dr Rene KocherEksir wrote this Rx 11/22/17 - please call [facility] and alert them I am no longer prescribing this medicine

## 2017-11-25 NOTE — Progress Notes (Signed)
Office: [phone]  /  Fax: [phone]   HPI:   Chief Complaint: OBESITY Michaela Holland is here to discuss her progress with her obesity treatment plan. She is on the  keep a food journal with 450 to 600 calories and 40 grams of protein at supper daily and the Category 3 plan and is following her eating plan approximately 90 % of the time. She states she is cycling, swimming and walking 30 minutes 2 to 4 times per week. Michaela Holland is doing really well managing 6 snacks. Michaela Holland is getting most of her protein intake and if she is not getting her protein intake, she finds herself more hungry. Her weight is (!) 306 lb (138.8 kg) today and has had a weight loss of 2 pounds over a period of 2 weeks since her last visit. She has lost 7 lbs since starting treatment with us.  Vitamin D deficiency Michaela Holland has a diagnosis of vitamin D deficiency. She is currently taking vit D and denies nausea, vomiting or muscle weakness.  Vitamin B12 deficiency Michaela Holland has a diagnosis of B12 insufficiency and notes fatigue. This is not a new diagnosis. Michaela Holland is not a vegetarian and does not have a previous diagnosis of pernicious anemia. She does not have a history of weight loss surgery. She is currently on OTC supplement of 2,500 mcg daily (couldn't find 2,000 mcg).  Polycystic Ovarian Syndrome Michaela Holland just started 500 mg of metformin in the morning. She has occasional GI upset, but not terrible.  ALLERGIES: Allergies  Allergen Reactions  . Sulfa Antibiotics Hives and Shortness Of Breath  . Other     Nuts severe reaction difficulty breathing , throat swelling; mostly tree nuts but not all tree nuts     MEDICATIONS: Current Outpatient Medications on File Prior to Visit  Medication Sig Dispense Refill  . Black Pepper-Turmeric (TURMERIC CURCUMIN) 01-999 MG CAPS Take 1 capsule by mouth daily.    . Cyanocobalamin 2000 MCG TBCR Take 1 tablet (2,000 mcg total) by mouth daily. 30 tablet 0  . diclofenac (VOLTAREN) 75 MG EC tablet Take 1  tablet (75 mg total) by mouth 2 (two) times daily. 60 tablet 1  . gabapentin (NEURONTIN) 300 MG capsule One tab PO qHS for a week, then BID for a week, then TID. May double weekly to a max of 3,600mg /day 180 capsule 3  . Glucosamine-Chondroit-Vit C-Mn (GLUCOSAMINE CHONDR 1500 COMPLX PO) Take 1 capsule by mouth daily.    Marland Kitchen. ibuprofen (ADVIL,MOTRIN) 200 MG tablet Take 800 mg by mouth every 8 (eight) hours as needed (for pain.).    Marland Kitchen. metFORMIN (GLUCOPHAGE-XR) 500 MG 24 hr tablet Take 1 tablet (500 mg total) by mouth at bedtime. (Patient taking differently: Take 1,000 mg by mouth at bedtime. ) 90 tablet 3  . omeprazole (PRILOSEC) 40 MG capsule Take 1 capsule (40 mg total) by mouth daily. (Patient taking differently: Take 40 mg by mouth every evening. ) 30 capsule 3  . ranitidine (ZANTAC) 150 MG tablet Take 150 mg by mouth daily as needed for heartburn.     . Vitamin D, Ergocalciferol, (DRISDOL) 50000 units CAPS capsule Take 1 capsule (50,000 Units total) by mouth every 7 (seven) days. 4 capsule 0   No current facility-administered medications on file prior to visit.     PAST MEDICAL HISTORY: Past Medical History:  Diagnosis Date  . Anxiety   . Back pain   . Chronic pain of left knee   . Depression   . Diabetes mellitus  without complication [facility])    denies this diagnosis   . Family history of adverse reaction to anesthesia    per patient ; father was having abdominal surgery and had to be placed on medicine to keep his blood pressure up; he never had any issues with low BP before the surgery "   . Gallbladder problem   . GERD (gastroesophageal reflux disease)   . Gout    believes only occurred once in feet;   . Hypertension    denies this diagnosis   . Hypothyroidism   . Joint pain   . Obesity   . Osteoarthritis   . Palpitations   . PCOS (polycystic ovarian syndrome)   . PONV (postoperative nausea and vomiting)    with c section; no issues with followwing procedures   . Seasonal  allergies   . Seizures (HCC)    has had "pre-seizure" activity in the brain but deneis any actual seizures   . Sleep apnea    does not currently use due to insurance   . Vitamin D deficiency     PAST SURGICAL HISTORY: Past Surgical History:  Procedure Laterality Date  . CESAREAN SECTION    . CHOLECYSTECTOMY    . ESOPHAGOGASTRODUODENOSCOPY  2001 and 2018  . GANGLION CYST EXCISION Left 2014   foot  . KNEE ARTHROSCOPY Left 06/28/2017   Procedure: LEFT KNEE ARTHROSCOPY with debridement;  Surgeon: Kathryne Hitch, MD;  Location: [facility];  Service: Orthopedics;  Laterality: Left;    SOCIAL HISTORY: Social History   Tobacco Use  . Smoking status: Never Smoker  . Smokeless tobacco: Never Used  Substance Use Topics  . Alcohol use: Yes    Comment: occasioanlly   . Drug use: No    FAMILY HISTORY: Family History  Problem Relation Age of Onset  . Polycystic ovary syndrome Mother   . Hypothyroidism Mother   . Hyperlipidemia Mother   . Depression Mother   . Obesity Mother   . Polycystic ovary syndrome Sister   . Hypothyroidism Sister   . Hypertension Father   . Hyperlipidemia Father   . Cancer Father   . Depression Father   . Anxiety disorder Father   . Obesity Father     ROS: Review of Systems  Constitutional: Positive for weight loss.  Gastrointestinal: Positive for diarrhea and nausea. Negative for vomiting.  Musculoskeletal:       Negative for muscle weakness    PHYSICAL EXAM: Blood pressure 136/86, pulse 80, temperature 98.3 F (36.8 C), temperature source Oral, height 5\' 4"  (1.626 m), weight (!) 306 lb (138.8 kg), SpO2 96 %. Body mass index is 52.52 kg/m. Physical Exam  Constitutional: She is oriented to person, place, and time. She appears well-developed and well-nourished.  Cardiovascular: Normal rate.  Pulmonary/Chest: Effort normal.  Musculoskeletal: Normal range of motion.  Neurological: She is oriented to person, place, and time.  Skin: Skin is  warm and dry.  Psychiatric: She has a normal mood and affect. Her behavior is normal.  Vitals reviewed.   RECENT LABS AND TESTS: BMET    Component Value Date/Time   NA 138 10/22/2017 1236   K 4.3 10/22/2017 1236   CL 100 10/22/2017 1236   CO2 21 10/22/2017 1236   GLUCOSE 81 10/22/2017 1236   GLUCOSE 92 07/11/2017 1204   BUN 10 10/22/2017 1236   CREATININE 0.55 (L) 10/22/2017 1236   CALCIUM 9.1 10/22/2017 1236   GFRNONAA 121 10/22/2017 1236   GFRAA 140 10/22/2017 1236  Lab Results  Component Value Date   HGBA1C 4.9 10/22/2017   HGBA1C 5.1 07/11/2017   Lab Results  Component Value Date   INSULIN 8.8 10/22/2017   CBC    Component Value Date/Time   WBC 9.5 10/22/2017 1236   WBC 8.4 06/25/2017 1036   RBC 4.52 10/22/2017 1236   RBC 4.78 06/25/2017 1036   HGB 12.8 10/22/2017 1236   HCT 37.8 10/22/2017 1236   PLT 327 06/25/2017 1036   MCV 84 10/22/2017 1236   MCH 28.3 10/22/2017 1236   MCH 27.6 06/25/2017 1036   MCHC 33.9 10/22/2017 1236   MCHC 32.0 06/25/2017 1036   RDW 13.5 10/22/2017 1236   LYMPHSABS 1.7 10/22/2017 1236   EOSABS 0.2 10/22/2017 1236   BASOSABS 0.0 10/22/2017 1236   Iron/TIBC/Ferritin/ %Sat No results found for: IRON, TIBC, FERRITIN, IRONPCTSAT Lipid Panel     Component Value Date/Time   CHOL 182 10/22/2017 1236   TRIG 117 10/22/2017 1236   HDL 54 10/22/2017 1236   LDLCALC 105 (H) 10/22/2017 1236   Hepatic Function Panel     Component Value Date/Time   PROT 6.8 10/22/2017 1236   ALBUMIN 4.1 10/22/2017 1236   AST 13 10/22/2017 1236   ALT 11 10/22/2017 1236   ALKPHOS 61 10/22/2017 1236   BILITOT 0.5 10/22/2017 1236      Component Value Date/Time   TSH 2.950 10/22/2017 1236   TSH 1.94 07/11/2017 1204   Results for CYTHIA, BACHTEL (MRN [id number]) as of 11/25/2017 12:08  Ref. Range 10/22/2017 12:36  Vitamin D, 25-Hydroxy Latest Ref Range: 30.0 - 100.0 ng/mL 14.0 (L)   ASSESSMENT AND PLAN: Vitamin D deficiency  Vitamin B12  deficiency  PCOS (polycystic ovarian syndrome)  Class 3 severe obesity with serious comorbidity and body mass index (BMI) of 50.0 to 59.9 in adult, unspecified obesity type (HCC)  PLAN:  Vitamin D Deficiency Michaela Holland was informed that low vitamin D levels contributes to fatigue and are associated with obesity, breast, and colon cancer. She agrees to continue to take prescription Vit D @50 ,000 IU every week and will follow up for routine testing of vitamin D, at least 2-3 times per year. She was informed of the risk of over-replacement of vitamin D and agrees to not increase her dose unless she discusses this with [place] first.  Vitamin B12 deficiency Michaela Holland will work on increasing B12 rich foods in her diet. Michaela Holland will continue OTC B12 supplementation 2,500 mcg and will follow up as directed.  Polycystic Ovarian Syndrome Michaela Holland agrees to continue metformin 500 mg q AM and 1,000 mg q PM and follow up as directed.  We spent > than 50% of the 15 minute visit on the counseling as documented in the note.  Obesity Michaela Holland is currently in the action stage of change. As such, her goal is to continue with weight loss efforts She has agreed to keep a food journal with 450 to 600 calories and 40 grams of protein at supper daily and follow the Category 3 plan Michaela Holland has been instructed to work up to a goal of 150 minutes of combined cardio and strengthening exercise per week for weight loss and overall health benefits. We discussed the following Behavioral Modification Strategies today: increasing lean protein intake, increase H2O intake and better snacking choices  Michaela Holland has agreed to follow up with our clinic in 2 weeks. She was informed of the importance of frequent follow up visits to maximize her success with intensive lifestyle modifications for  her multiple health conditions.   OBESITY BEHAVIORAL INTERVENTION VISIT  Today's visit was # 3 out of 22.  Starting weight: 313 lbs Starting date: 10/22/17 Today's  weight : 306 lbs  Today's date: 11/21/2017 Total lbs lost to date: 7 (Patients must lose 7 lbs in the first 6 months to continue with counseling)   ASK: We discussed the diagnosis of obesity with Michaela Holland today and Michaela Holland agreed to give [place] permission to discuss obesity behavioral modification therapy today.  ASSESS: Michaela Holland has the diagnosis of obesity and her BMI today is 65.5 Michaela Holland is in the action stage of change   ADVISE: Michaela Holland was educated on the multiple health risks of obesity as well as the benefit of weight loss to improve her health. She was advised of the need for long term treatment and the importance of lifestyle modifications.  AGREE: Multiple dietary modification options and treatment options were discussed and  Michaela Holland agreed to the above obesity treatment plan.  I, [place] Crane, am acting as transcriptionist for Filbert Schilder, MD  I have reviewed the above documentation for accuracy and completeness, and I agree with the above. - Debbra Riding, MD

## 2017-11-25 NOTE — Telephone Encounter (Signed)
Pharmacy updated.

## 2017-12-03 ENCOUNTER — Encounter (INDEPENDENT_AMBULATORY_CARE_PROVIDER_SITE_OTHER): Payer: Self-pay | Admitting: Physician Assistant

## 2017-12-05 ENCOUNTER — Ambulatory Visit (INDEPENDENT_AMBULATORY_CARE_PROVIDER_SITE_OTHER): Payer: 59 | Admitting: Physician Assistant

## 2017-12-05 VITALS — BP 135/83 | HR 78 | Temp 97.6°F | Ht 64.0 in | Wt 301.0 lb

## 2017-12-05 DIAGNOSIS — Z6841 Body Mass Index (BMI) 40.0 and over, adult: Secondary | ICD-10-CM

## 2017-12-05 DIAGNOSIS — Z9189 Other specified personal risk factors, not elsewhere classified: Secondary | ICD-10-CM | POA: Diagnosis not present

## 2017-12-05 DIAGNOSIS — E539 Vitamin B deficiency, unspecified: Secondary | ICD-10-CM

## 2017-12-05 DIAGNOSIS — E559 Vitamin D deficiency, unspecified: Secondary | ICD-10-CM

## 2017-12-05 MED ORDER — VITAMIN D (ERGOCALCIFEROL) 1.25 MG (50000 UNIT) PO CAPS: 50000.0000 [IU] | ORAL_CAPSULE | ORAL | 0 refills | Status: DC

## 2017-12-05 MED FILL — VIT D2 1.25 MG (50,000 UNIT: 1.25 MG | 28 days supply | Qty: 4 | Fill #0

## 2017-12-05 NOTE — Progress Notes (Signed)
Office: [phone]  /  Fax: [phone]   HPI:   Chief Complaint: OBESITY Michaela Holland is here to discuss her progress with her obesity treatment plan. She is on the keep a food journal with 450-600 calories and 40 grams of protein at supper daily and follow the Category 3 plan and is following her eating plan approximately 85 % of the time. She states she is walking for 15 minutes 2 times per week. Michaela Holland continues to well with weight loss. She has been keeping up with her protein and she is keeping a food journal at dinner. She was offered Category 4 but would like to continue with Category 3 + 300 calories.  Her weight is (!) 301 lb (136.5 kg) today and has had a weight loss of 5 pounds over a period of 2 weeks since her last visit. She has lost 12 lbs since starting treatment with [place].  Vitamin D Deficiency Michaela Holland has a diagnosis of vitamin D deficiency. She is currently taking prescription Vit D and denies nausea, vomiting or muscle weakness.  At risk for osteopenia and osteoporosis Michaela Holland is at higher risk of osteopenia and osteoporosis due to vitamin D deficiency.   Vitamin B12 Deficiency Michaela Holland has a diagnosis of B12 insufficiency. She does not have a previous diagnosis of pernicious anemia. She does not have a history of weight loss surgery.   ALLERGIES: Allergies  Allergen Reactions  . Sulfa Antibiotics Hives and Shortness Of Breath  . Other     Nuts severe reaction difficulty breathing , throat swelling; mostly tree nuts but not all tree nuts     MEDICATIONS: Current Outpatient Medications on File Prior to Visit  Medication Sig Dispense Refill  . Black Pepper-Turmeric (TURMERIC CURCUMIN) 01-999 MG CAPS Take 1 capsule by mouth daily.    . citalopram (CELEXA) 20 MG tablet Take 1 tablet (20 mg total) by mouth daily. 90 tablet 0  . clonazePAM (KLONOPIN) 0.5 MG tablet Take 0.5-1 tablets (0.25-0.5 mg total) by mouth 2 (two) times daily as needed for anxiety (#30 for 90 days.). 30 tablet 0    . Cyanocobalamin 2000 MCG TBCR Take 1 tablet (2,000 mcg total) by mouth daily. 30 tablet 0  . diclofenac (VOLTAREN) 75 MG EC tablet Take 1 tablet (75 mg total) by mouth 2 (two) times daily. 60 tablet 1  . gabapentin (NEURONTIN) 300 MG capsule One tab PO qHS for a week, then BID for a week, then TID. May double weekly to a max of 3,600mg /day 180 capsule 3  . Glucosamine-Chondroit-Vit C-Mn (GLUCOSAMINE CHONDR 1500 COMPLX PO) Take 1 capsule by mouth daily.    Marland Kitchen ibuprofen (ADVIL,MOTRIN) 200 MG tablet Take 800 mg by mouth every 8 (eight) hours as needed (for pain.).    Marland Kitchen metFORMIN (GLUCOPHAGE-XR) 500 MG 24 hr tablet Take 1 tablet (500 mg total) by mouth at bedtime. (Patient taking differently: Take 1,000 mg by mouth at bedtime. ) 90 tablet 3  . omeprazole (PRILOSEC) 40 MG capsule Take 1 capsule (40 mg total) by mouth daily. (Patient taking differently: Take 40 mg by mouth every evening. ) 30 capsule 3  . ranitidine (ZANTAC) 150 MG tablet Take 150 mg by mouth daily as needed for heartburn.     . Vitamin D, Ergocalciferol, (DRISDOL) 50000 units CAPS capsule Take 1 capsule (50,000 Units total) by mouth every 7 (seven) days. 4 capsule 0   No current facility-administered medications on file prior to visit.     PAST MEDICAL HISTORY: Past Medical  History:  Diagnosis Date  . Anxiety   . Back pain   . Chronic pain of left knee   . Depression   . Diabetes mellitus without complication (HCC)    denies this diagnosis   . Family history of adverse reaction to anesthesia    per patient ; father was having abdominal surgery and had to be placed on medicine to keep his blood pressure up; he never had any issues with low BP before the surgery "   . Gallbladder problem   . GERD (gastroesophageal reflux disease)   . Gout    believes only occurred once in feet;   . Hypertension    denies this diagnosis   . Hypothyroidism   . Joint pain   . Obesity   . Osteoarthritis   . Palpitations   . PCOS (polycystic  ovarian syndrome)   . PONV (postoperative nausea and vomiting)    with c section; no issues with followwing procedures   . Seasonal allergies   . Seizures (HCC)    has had "pre-seizure" activity in the brain but deneis any actual seizures   . Sleep apnea    does not currently use due to insurance   . Vitamin D deficiency     PAST SURGICAL HISTORY: Past Surgical History:  Procedure Laterality Date  . CESAREAN SECTION    . CHOLECYSTECTOMY    . ESOPHAGOGASTRODUODENOSCOPY  2001 and 2018  . GANGLION CYST EXCISION Left 2014   foot  . KNEE ARTHROSCOPY Left 06/28/2017   Procedure: LEFT KNEE ARTHROSCOPY with debridement;  Surgeon: Kathryne Hitch, MD;  Location: [facility];  Service: Orthopedics;  Laterality: Left;    SOCIAL HISTORY: Social History   Tobacco Use  . Smoking status: Never Smoker  . Smokeless tobacco: Never Used  Substance Use Topics  . Alcohol use: Yes    Comment: occasioanlly   . Drug use: No    FAMILY HISTORY: Family History  Problem Relation Age of Onset  . Polycystic ovary syndrome Mother   . Hypothyroidism Mother   . Hyperlipidemia Mother   . Depression Mother   . Obesity Mother   . Polycystic ovary syndrome Sister   . Hypothyroidism Sister   . Hypertension Father   . Hyperlipidemia Father   . Cancer Father   . Depression Father   . Anxiety disorder Father   . Obesity Father     ROS: Review of Systems  Constitutional: Positive for weight loss.  Gastrointestinal: Negative for nausea and vomiting.  Musculoskeletal:       Negative muscle weakness    PHYSICAL EXAM: Blood pressure 135/83, pulse 78, temperature 97.6 F (36.4 C), temperature source Oral, height 5\' 4"  (1.626 m), weight (!) 301 lb (136.5 kg), SpO2 99 %. Body mass index is 51.67 kg/m. Physical Exam  Constitutional: She is oriented to person, place, and time. She appears well-developed and well-nourished.  Cardiovascular: Normal rate.  Pulmonary/Chest: Effort normal.    Musculoskeletal: Normal range of motion.  Neurological: She is oriented to person, place, and time.  Skin: Skin is warm and dry.  Psychiatric: She has a normal mood and affect. Her behavior is normal.  Vitals reviewed.   RECENT LABS AND TESTS: BMET    Component Value Date/Time   NA 138 10/22/2017 1236   K 4.3 10/22/2017 1236   CL 100 10/22/2017 1236   CO2 21 10/22/2017 1236   GLUCOSE 81 10/22/2017 1236   GLUCOSE 92 07/11/2017 1204   BUN 10 10/22/2017  1236   CREATININE 0.55 (L) 10/22/2017 1236   CALCIUM 9.1 10/22/2017 1236   GFRNONAA 121 10/22/2017 1236   GFRAA 140 10/22/2017 1236   Lab Results  Component Value Date   HGBA1C 4.9 10/22/2017   HGBA1C 5.1 07/11/2017   Lab Results  Component Value Date   INSULIN 8.8 10/22/2017   CBC    Component Value Date/Time   WBC 9.5 10/22/2017 1236   WBC 8.4 06/25/2017 1036   RBC 4.52 10/22/2017 1236   RBC 4.78 06/25/2017 1036   HGB 12.8 10/22/2017 1236   HCT 37.8 10/22/2017 1236   PLT 327 06/25/2017 1036   MCV 84 10/22/2017 1236   MCH 28.3 10/22/2017 1236   MCH 27.6 06/25/2017 1036   MCHC 33.9 10/22/2017 1236   MCHC 32.0 06/25/2017 1036   RDW 13.5 10/22/2017 1236   LYMPHSABS 1.7 10/22/2017 1236   EOSABS 0.2 10/22/2017 1236   BASOSABS 0.0 10/22/2017 1236   Iron/TIBC/Ferritin/ %Sat No results found for: IRON, TIBC, FERRITIN, IRONPCTSAT Lipid Panel     Component Value Date/Time   CHOL 182 10/22/2017 1236   TRIG 117 10/22/2017 1236   HDL 54 10/22/2017 1236   LDLCALC 105 (H) 10/22/2017 1236   Hepatic Function Panel     Component Value Date/Time   PROT 6.8 10/22/2017 1236   ALBUMIN 4.1 10/22/2017 1236   AST 13 10/22/2017 1236   ALT 11 10/22/2017 1236   ALKPHOS 61 10/22/2017 1236   BILITOT 0.5 10/22/2017 1236      Component Value Date/Time   TSH 2.950 10/22/2017 1236   TSH 1.94 07/11/2017 1204  Results for ADALEENA, MOOERS (MRN [id number]) as of 12/05/2017 13:13  Ref. Range 10/22/2017 12:36  Vitamin D, 25-Hydroxy  Latest Ref Range: 30.0 - 100.0 ng/mL 14.0 (L)    ASSESSMENT AND PLAN: Vitamin D deficiency - Plan: Vitamin D, Ergocalciferol, (DRISDOL) 50000 units CAPS capsule  Vitamin B deficiency  At risk for osteoporosis  Class 3 severe obesity with serious comorbidity and body mass index (BMI) of 50.0 to 59.9 in adult, unspecified obesity type (HCC)  PLAN:  Vitamin D Deficiency Michaela Holland was informed that low vitamin D levels contributes to fatigue and are associated with obesity, breast, and colon cancer. Michaela Holland agrees to continue taking prescription Vit D @50 ,000 IU every week #4 and we will refill for 1 month. She will follow up for routine testing of vitamin D, at least 2-3 times per year. She was informed of the risk of over-replacement of vitamin D and agrees to not increase her dose unless she discusses this with [place] first. Michaela Holland agrees to follow up with our clinic in 2 weeks.  At risk for osteopenia and osteoporosis Michaela Holland is at risk for osteopenia and osteoporsis due to her vitamin D deficiency. She was encouraged to take her vitamin D and follow her higher calcium diet and increase strengthening exercise to help strengthen her bones and decrease her risk of osteopenia and osteoporosis.  Vitamin B12 Deficiency Michaela Holland will work on increasing B12 rich foods in her diet. B12 supplementation was not prescribed today. Michaela Holland agrees to continue taking Cyanocobalamin and she agrees to follow up with our clinic in 2 weeks.  Obesity Michaela Holland is currently in the action stage of change. As such, her goal is to continue with weight loss efforts She has agreed to keep a food journal with 600 calories and 40 grams of protein at supper daily and follow the Category 3 plan + 300 calories Michaela Holland has been  instructed to work up to a goal of 150 minutes of combined cardio and strengthening exercise per week for weight loss and overall health benefits. We discussed the following Behavioral Modification Strategies today: increasing  lean protein intake and work on meal planning and easy cooking plans   Michaela Holland has agreed to follow up with our clinic in 2 weeks. She was informed of the importance of frequent follow up visits to maximize her success with intensive lifestyle modifications for her multiple health conditions.   OBESITY BEHAVIORAL INTERVENTION VISIT  Today's visit was # 4 out of 22.  Starting weight: 313 lbs Starting date: 10/22/17 Today's weight : 301 lbs Today's date: 12/05/2017 Total lbs lost to date: 12 (Patients must lose 7 lbs in the first 6 months to continue with counseling)   ASK: We discussed the diagnosis of obesity with Michaela Holland today and Michaela Holland agreed to give us permission to discuss obesity behavioral modification therapy today.  ASSESS: Michaela Holland has the diagnosis of obesity and her BMI today is 51.64 Michaela Holland is in the action stage of change   ADVISE: Michaela Holland was educated on the multiple health risks of obesity as well as the benefit of weight loss to improve her health. She was advised of the need for long term treatment and the importance of lifestyle modifications.  AGREE: Multiple dietary modification options and treatment options were discussed and  Michaela Holland agreed to the above obesity treatment plan.   Trude McburneyI, Michaela Holland, am acting as transcriptionist for Illa LevelSahar Osman, PA-C I, Illa LevelSahar Osman [facility], have reviewed this note and agree with its content

## 2017-12-06 MED FILL — OMEPRAZOLE DR 40 MG CAPSULE: 40 | 30 days supply | Qty: 30 | Fill #0

## 2017-12-09 ENCOUNTER — Telehealth: Payer: Self-pay

## 2017-12-09 ENCOUNTER — Other Ambulatory Visit: Payer: Self-pay

## 2017-12-09 DIAGNOSIS — K2100 Gastro-esophageal reflux disease with esophagitis, without bleeding: Secondary | ICD-10-CM

## 2017-12-09 MED ORDER — OMEPRAZOLE 40 MG PO CPDR: 40.0000 mg | DELAYED_RELEASE_CAPSULE | Freq: Every day | ORAL | 6 refills | Status: DC

## 2017-12-09 NOTE — Telephone Encounter (Signed)
[facility] requesting med refill for omeprazole. Pls advise if refill appropriate. Thanks.

## 2017-12-09 NOTE — Telephone Encounter (Signed)
Ok to refill x 6  

## 2017-12-09 NOTE — Telephone Encounter (Signed)
Task completed - med [facility]. Left vm msg for pt regarding update. Call back information provided.

## 2017-12-17 ENCOUNTER — Ambulatory Visit (INDEPENDENT_AMBULATORY_CARE_PROVIDER_SITE_OTHER): Payer: 59 | Admitting: Licensed Clinical Social Worker

## 2017-12-17 ENCOUNTER — Encounter (HOSPITAL_COMMUNITY): Payer: Self-pay | Admitting: Licensed Clinical Social Worker

## 2017-12-17 DIAGNOSIS — F331 Major depressive disorder, recurrent, moderate: Secondary | ICD-10-CM | POA: Diagnosis not present

## 2017-12-17 DIAGNOSIS — F419 Anxiety disorder, unspecified: Secondary | ICD-10-CM | POA: Diagnosis not present

## 2017-12-17 NOTE — Progress Notes (Signed)
   THERAPIST PROGRESS NOTE  Session Time: 11:10-12pm  Participation Level: Active  Behavioral Response: CasualAlertAnxious  Type of Therapy: Individual Therapy  Treatment Goals addressed: Anxiety  Interventions: CBT  Summary: Garald Baldingnne K Meland is a 37 y.o. female who presents for her individual counseling session. Pt has not been to therapy in 4 months. Pt has been experiencing a lot of health issues. Her knee has not gotten better even through rehab and rest. She is currently on STD. 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 This has caused family conflict: financially, communication, intimacy, physical impairment. Asked open ended questions and used empathic reflection. Pt talked about her fears, concerns because of her health issues. The one positive note is pt feels her medications are working well. Pt wants to return to therapy on a more regular basis.    Suicidal/Homicidal: Nowithout intent/plan  Therapist Response:  Assessed pt's current functioning and reviewed progress. Assisted pt effective communication skills, return to work, Orthoptistjournaling, meditation. Assisted pt processing for the management of stressors.  Plan: Return again in 2 weeks.  Diagnosis: Axis I: Moderate episode of recurrent major depressive disorder    MACKENZIE,LISBETH S, LCAS 12/17/2017

## 2017-12-19 ENCOUNTER — Ambulatory Visit (INDEPENDENT_AMBULATORY_CARE_PROVIDER_SITE_OTHER): Payer: 59 | Admitting: Physician Assistant

## 2017-12-19 ENCOUNTER — Encounter: Payer: Self-pay | Admitting: Physician Assistant

## 2017-12-19 VITALS — BP 145/87 | HR 90 | Temp 98.1°F | Wt 312.0 lb

## 2017-12-19 DIAGNOSIS — J22 Unspecified acute lower respiratory infection: Secondary | ICD-10-CM

## 2017-12-19 DIAGNOSIS — B9689 Other specified bacterial agents as the cause of diseases classified elsewhere: Secondary | ICD-10-CM | POA: Diagnosis not present

## 2017-12-19 DIAGNOSIS — J019 Acute sinusitis, unspecified: Secondary | ICD-10-CM | POA: Diagnosis not present

## 2017-12-19 DIAGNOSIS — L304 Erythema intertrigo: Secondary | ICD-10-CM

## 2017-12-19 MED ORDER — CEFDINIR 300 MG PO CAPS: 300.0000 mg | ORAL_CAPSULE | Freq: Two times a day (BID) | ORAL | 0 refills | Status: DC

## 2017-12-19 MED ORDER — PREDNISONE 50 MG PO TABS: 50.0000 mg | ORAL_TABLET | Freq: Every day | ORAL | 0 refills | Status: DC

## 2017-12-19 MED ORDER — IPRATROPIUM BROMIDE 0.06 % NA SOLN: 2.0000 | Freq: Four times a day (QID) | NASAL | 0 refills | Status: DC | PRN

## 2017-12-19 MED ORDER — FLUCONAZOLE 150 MG PO TABS: 150.0000 mg | ORAL_TABLET | Freq: Once | ORAL | 0 refills | Status: AC

## 2017-12-19 MED ORDER — ALBUTEROL SULFATE HFA 108 (90 BASE) MCG/ACT IN AERS
1.0000 | INHALATION_SPRAY | RESPIRATORY_TRACT | 0 refills | Status: DC | PRN
Start: 2017-12-19 — End: 2020-07-18

## 2017-12-19 NOTE — Progress Notes (Signed)
HPI:                                                                Michaela Holland is a 37 y.o. female who presents to [facility] Primary Care Sports Medicine today for cough and congestion  Sinus Problem  This is a new problem. The current episode started in the past 7 days. The problem has been gradually worsening since onset. There has been no fever. The pain is moderate. Associated symptoms include congestion (productive of blood-tinged sputum), coughing, headaches, shortness of breath (w/coughing spells) and sinus pressure. Pertinent negatives include no ear pain (b/l ear fullness), hoarse voice, neck pain, sore throat or swollen glands. Past treatments include oral decongestants, spray decongestants and acetaminophen (Dayquil, Nyquil). The treatment provided mild relief.  Sick contacts include young daughter.  No flowsheet data found.    Past Medical History:  Diagnosis Date  . Anxiety   . Back pain   . Chronic pain of left knee   . Depression   . Diabetes mellitus without complication (HCC)    denies this diagnosis   . Family history of adverse reaction to anesthesia    per patient ; father was having abdominal surgery and had to be placed on medicine to keep his blood pressure up; he never had any issues with low BP before the surgery "   . Gallbladder problem   . GERD (gastroesophageal reflux disease)   . Gout    believes only occurred once in feet;   . Hypertension    denies this diagnosis   . Hypothyroidism   . Joint pain   . Obesity   . Osteoarthritis   . Palpitations   . PCOS (polycystic ovarian syndrome)   . PONV (postoperative nausea and vomiting)    with c section; no issues with followwing procedures   . Seasonal allergies   . Seizures (HCC)    has had "pre-seizure" activity in the brain but deneis any actual seizures   . Sleep apnea    does not currently use due to insurance   . Vitamin D deficiency    Past Surgical History:   Procedure Laterality Date  . CESAREAN SECTION    . CHOLECYSTECTOMY    . ESOPHAGOGASTRODUODENOSCOPY  2001 and 2018  . GANGLION CYST EXCISION Left 2014   foot  . KNEE ARTHROSCOPY Left 06/28/2017   Procedure: LEFT KNEE ARTHROSCOPY with debridement;  Surgeon: Kathryne Hitch, MD;  Location: [facility];  Service: Orthopedics;  Laterality: Left;   Social History   Tobacco Use  . Smoking status: Never Smoker  . Smokeless tobacco: Never Used  Substance Use Topics  . Alcohol use: Yes    Comment: occasioanlly    family history includes Anxiety disorder in her father; Cancer in her father; Depression in her father and mother; Hyperlipidemia in her father and mother; Hypertension in her father; Hypothyroidism in her mother and sister; Obesity in her father and mother; Polycystic ovary syndrome in her mother and sister.    ROS: negative except as noted in the HPI  Medications: Current Outpatient Medications  Medication Sig Dispense Refill  . Black Pepper-Turmeric (TURMERIC CURCUMIN) 01-999 MG CAPS Take 1 capsule by mouth daily.    . citalopram (CELEXA) 20  MG tablet Take 1 tablet (20 mg total) by mouth daily. 90 tablet 0  . clonazePAM (KLONOPIN) 0.5 MG tablet Take 0.5-1 tablets (0.25-0.5 mg total) by mouth 2 (two) times daily as needed for anxiety (#30 for 90 days.). 30 tablet 0  . Cyanocobalamin 2000 MCG TBCR Take 1 tablet (2,000 mcg total) by mouth daily. 30 tablet 0  . diclofenac (VOLTAREN) 75 MG EC tablet Take 1 tablet (75 mg total) by mouth 2 (two) times daily. 60 tablet 1  . gabapentin (NEURONTIN) 300 MG capsule One tab PO qHS for a week, then BID for a week, then TID. May double weekly to a max of 3,600mg /day 180 capsule 3  . Glucosamine-Chondroit-Vit C-Mn (GLUCOSAMINE CHONDR 1500 COMPLX PO) Take 1 capsule by mouth daily.    Marland Kitchen ibuprofen (ADVIL,MOTRIN) 200 MG tablet Take 800 mg by mouth every 8 (eight) hours as needed (for pain.).    Marland Kitchen metFORMIN (GLUCOPHAGE-XR) 500 MG 24 hr tablet  Take 1 tablet (500 mg total) by mouth at bedtime. (Patient taking differently: Take 1,000 mg by mouth at bedtime. ) 90 tablet 3  . omeprazole (PRILOSEC) 40 MG capsule Take 1 capsule (40 mg total) by mouth daily. 30 capsule 6  . ranitidine (ZANTAC) 150 MG tablet Take 150 mg by mouth daily as needed for heartburn.     . Vitamin D, Ergocalciferol, (DRISDOL) 50000 units CAPS capsule Take 1 capsule (50,000 Units total) by mouth every 7 (seven) days. 4 capsule 0  . albuterol (PROVENTIL HFA;VENTOLIN HFA) 108 (90 Base) MCG/ACT inhaler Inhale 1-2 puffs into the lungs every 4 (four) hours as needed for wheezing or shortness of breath (bronchospasm). 1 Inhaler 0  . cefdinir (OMNICEF) 300 MG capsule Take 1 capsule (300 mg total) by mouth 2 (two) times daily. 14 capsule 0  . fluconazole (DIFLUCAN) 150 MG tablet Take 1 tablet (150 mg total) by mouth once for 1 dose. 1 tablet 0  . ipratropium (ATROVENT) 0.06 % nasal spray Place 2 sprays into both nostrils 4 (four) times daily as needed for rhinitis. 15 mL 0  . predniSONE (DELTASONE) 50 MG tablet Take 1 tablet (50 mg total) by mouth daily. 5 tablet 0   No current facility-administered medications for this visit.    Allergies  Allergen Reactions  . Sulfa Antibiotics Hives and Shortness Of Breath  . Other     Nuts severe reaction difficulty breathing , throat swelling; mostly tree nuts but not all tree nuts        Objective:  BP (!) 145/87   Pulse 90   Temp 98.1 F (36.7 C) (Oral)   Wt (!) 312 lb (141.5 kg)   SpO2 96%   BMI 53.55 kg/m  Gen:  alert, ill-appearing, not toxic-appearing, no distress, appropriate for age, obese female HEENT: head normocephalic without obvious abnormality, conjunctiva and cornea clear, wearing glasses, TM's pearly gray and semi-transparent bilaterally, normal external ear canals, nasal mucosa edematous and nares with erythema, oropharynx clear, moist mucous membranes, no cervical adenopathy,  trachea midline Pulm: Normal  work of breathing, normal phonation, clear to auscultation bilaterally, no wheezes, rales or rhonchi CV: Normal rate, regular rhythm, s1 and s2 distinct, no murmurs, clicks or rubs  Neuro: alert and oriented x 3, no tremor MSK: extremities atraumatic, normal gait and station Skin: intact, erythematous rash in the intertriginous areas of the breast and groin      No results found for this or any previous visit (from the past 72 hour(s)). No results found.  Assessment and Plan: 37 y.o. female with   Acute bacterial sinusitis - Plan: cefdinir (OMNICEF) 300 MG capsule, ipratropium (ATROVENT) 0.06 % nasal spray  Acute lower respiratory infection - Plan: cefdinir (OMNICEF) 300 MG capsule, predniSONE (DELTASONE) 50 MG tablet, albuterol (PROVENTIL HFA;VENTOLIN HFA) 108 (90 Base) MCG/ACT inhaler  Intertrigo  >1 week of URI symptoms, gradually worsening. Productive cough that is blood-tinged. SpO2 96% on RA at rest, relative hypoxia no evidence of respiratory distress. Covering for bacterial respiratory infection with Omnicef. Continue symptomatic care for sinusitis and bronchitis with saline rinses, spray decongestants and bronchodilator.  She will try OTC Lotrimin for intertrigo  Patient education and anticipatory guidance given Patient agrees with treatment plan Follow-up as needed if symptoms worsen or fail to improve  Levonne Hubertharley E. Cummings PA-C

## 2017-12-19 NOTE — Patient Instructions (Signed)
Intertrigo Intertrigo is skin irritation or inflammation (dermatitis) that occurs when folds of skin rub together. The irritation can cause a rash and make skin raw and itchy. This condition most commonly occurs in the skin folds of these areas:  Toes.  Armpits.  Groin.  Belly.  Breasts.  Buttocks.  Intertrigo is not passed from person to person (is not contagious). What are the causes? This condition is caused by heat, moisture, friction, and lack of air circulation. The condition can be made worse by:  Sweat.  Bacteria or a fungus, such as yeast.  What increases the risk? This condition is more likely to occur if you have moisture in your skin folds. It is also more likely to develop in people who:  Have diabetes.  Are overweight.  Are on bed rest.  Live in a warm and moist climate.  Wear splints, braces, or other medical devices.  Are not able to control their bowels or bladder (have incontinence).  What are the signs or symptoms? Symptoms of this condition include:  A pink or red skin rash.  Brown patches on the skin.  Raw or scaly skin.  Itchiness.  A burning feeling.  Bleeding.  Leaking fluid.  A bad smell.  How is this diagnosed? This condition is diagnosed with a medical history and physical exam. You may also have a skin swab to test for bacteria or a fungus, such as yeast. How is this treated? Treatment may include:  Cleaning and drying your skin.  An oral antibiotic medicine or antibiotic skin cream for a bacterial infection.  Antifungal cream or pills for an infection that was caused by a fungus, such as yeast.  Steroid ointment to relieve itchiness and irritation.  Follow these instructions at home:  Keep the affected area clean and dry.  Do not scratch your skin.  Stay in a cool environment as much as possible. Use an air conditioner or fan, if available.  Apply over-the-counter and prescription medicines only as told by your  health care provider.  If you were prescribed an antibiotic medicine, use it as told by your health care provider. Do not stop using the antibiotic even if your condition improves.  Keep all follow-up visits as told by your health care provider. This is important. How is this prevented?  Maintain a healthy weight.  Take care of your feet, especially if you have diabetes. Foot care includes: ? Wearing shoes that fit well. ? Keeping your feet dry. ? Wearing clean, breathable socks.  Protect the skin around your groin and buttocks, especially if you have incontinence. Skin protection includes: ? Following a regular cleaning routine. ? Using moisturizers and skin protectants. ? Changing protection pads frequently.  Do not wear tight clothes. Wear clothes that are loose and absorbent. Wear clothes that are made of cotton.  Wear a bra that gives good support, if needed.  Shower and dry yourself thoroughly after activity. Use a hair dryer on a cool setting to dry between skin folds, especially after you bathe.  If you have diabetes, keep your blood sugar under control. Contact a health care provider if:  Your symptoms do not improve with treatment.  Your symptoms get worse or they spread.  You notice increased redness and warmth.  You have a fever. This information is not intended to replace advice given to you by your health care provider. Make sure you discuss any questions you have with your health care provider. Document Released: 08/27/2005 Document Revised: 02/02/2016   Document Reviewed: 02/28/2015 Elsevier Interactive Patient Education  2018 Elsevier Inc[employer]  

## 2017-12-23 ENCOUNTER — Ambulatory Visit (INDEPENDENT_AMBULATORY_CARE_PROVIDER_SITE_OTHER): Payer: 59 | Admitting: Family Medicine

## 2017-12-23 VITALS — BP 144/85 | HR 80 | Temp 98.5°F | Ht 64.0 in | Wt 307.0 lb

## 2017-12-23 DIAGNOSIS — Z6841 Body Mass Index (BMI) 40.0 and over, adult: Secondary | ICD-10-CM | POA: Diagnosis not present

## 2017-12-23 DIAGNOSIS — E559 Vitamin D deficiency, unspecified: Secondary | ICD-10-CM | POA: Diagnosis not present

## 2017-12-23 DIAGNOSIS — Z9189 Other specified personal risk factors, not elsewhere classified: Secondary | ICD-10-CM

## 2017-12-23 DIAGNOSIS — E282 Polycystic ovarian syndrome: Secondary | ICD-10-CM | POA: Diagnosis not present

## 2017-12-23 MED ORDER — VITAMIN D (ERGOCALCIFEROL) 1.25 MG (50000 UNIT) PO CAPS: 50000.0000 [IU] | ORAL_CAPSULE | ORAL | 0 refills | Status: DC

## 2017-12-23 NOTE — Progress Notes (Signed)
Office: [phone]  /  Fax: [phone]   HPI:   Chief Complaint: OBESITY Michaela Holland is here to discuss her progress with her obesity treatment plan. She is on the Category 3 plan + 300 calories and is following her eating plan approximately 25 % of the time. She states she is walking and pod for 30 minutes 3-5 times per week. Michaela Holland states past few weeks has been difficult. Her husband and daughter all not feeling well. Just eating whatever they can, eating what tends to be quick and easy.  Her weight is (!) 307 lb (139.3 kg) today and has gained 6 pounds since her last visit. She has lost 6 lbs since starting treatment with [place].  Vitamin D Deficiency Michaela Holland has a diagnosis of vitamin D deficiency. She is currently taking prescription Vit D and denies nausea, vomiting or muscle weakness.  At risk for osteopenia and osteoporosis Michaela Holland is at higher risk of osteopenia and osteoporosis due to vitamin D deficiency.   Polycystic Ovary Syndrome Michaela Holland has a diagnosis of polycystic ovarian syndrome. Minimally insulin resistant. She is on metformin.  ALLERGIES: Allergies  Allergen Reactions  . Sulfa Antibiotics Hives and Shortness Of Breath  . Other     Nuts severe reaction difficulty breathing , throat swelling; mostly tree nuts but not all tree nuts     MEDICATIONS: Current Outpatient Medications on File Prior to Visit  Medication Sig Dispense Refill  . albuterol (PROVENTIL HFA;VENTOLIN HFA) 108 (90 Base) MCG/ACT inhaler Inhale 1-2 puffs into the lungs every 4 (four) hours as needed for wheezing or shortness of breath (bronchospasm). 1 Inhaler 0  . Black Pepper-Turmeric (TURMERIC CURCUMIN) 01-999 MG CAPS Take 1 capsule by mouth daily.    . cefdinir (OMNICEF) 300 MG capsule Take 1 capsule (300 mg total) by mouth 2 (two) times daily. 14 capsule 0  . citalopram (CELEXA) 20 MG tablet Take 1 tablet (20 mg total) by mouth daily. 90 tablet 0  . clonazePAM (KLONOPIN) 0.5 MG tablet Take 0.5-1 tablets  (0.25-0.5 mg total) by mouth 2 (two) times daily as needed for anxiety (#30 for 90 days.). 30 tablet 0  . Cyanocobalamin 2000 MCG TBCR Take 1 tablet (2,000 mcg total) by mouth daily. 30 tablet 0  . diclofenac (VOLTAREN) 75 MG EC tablet Take 1 tablet (75 mg total) by mouth 2 (two) times daily. 60 tablet 1  . gabapentin (NEURONTIN) 300 MG capsule One tab PO qHS for a week, then BID for a week, then TID. May double weekly to a max of 3,600mg /day 180 capsule 3  . Glucosamine-Chondroit-Vit C-Mn (GLUCOSAMINE CHONDR 1500 COMPLX PO) Take 1 capsule by mouth daily.    Marland Kitchen ibuprofen (ADVIL,MOTRIN) 200 MG tablet Take 800 mg by mouth every 8 (eight) hours as needed (for pain.).    Marland Kitchen ipratropium (ATROVENT) 0.06 % nasal spray Place 2 sprays into both nostrils 4 (four) times daily as needed for rhinitis. 15 mL 0  . metFORMIN (GLUCOPHAGE-XR) 500 MG 24 hr tablet Take 1 tablet (500 mg total) by mouth at bedtime. (Patient taking differently: Take 1,000 mg by mouth at bedtime. ) 90 tablet 3  . omeprazole (PRILOSEC) 40 MG capsule Take 1 capsule (40 mg total) by mouth daily. 30 capsule 6  . predniSONE (DELTASONE) 50 MG tablet Take 1 tablet (50 mg total) by mouth daily. 5 tablet 0  . ranitidine (ZANTAC) 150 MG tablet Take 150 mg by mouth daily as needed for heartburn.      No current facility-administered  medications on file prior to visit.     PAST MEDICAL HISTORY: Past Medical History:  Diagnosis Date  . Anxiety   . Back pain   . Chronic pain of left knee   . Depression   . Diabetes mellitus without complication (HCC)    denies this diagnosis   . Family history of adverse reaction to anesthesia    per patient ; father was having abdominal surgery and had to be placed on medicine to keep his blood pressure up; he never had any issues with low BP before the surgery "   . Gallbladder problem   . GERD (gastroesophageal reflux disease)   . Gout    believes only occurred once in feet;   . Hypertension    denies  this diagnosis   . Hypothyroidism   . Joint pain   . Obesity   . Osteoarthritis   . Palpitations   . PCOS (polycystic ovarian syndrome)   . PONV (postoperative nausea and vomiting)    with c section; no issues with followwing procedures   . Seasonal allergies   . Seizures (HCC)    has had "pre-seizure" activity in the brain but deneis any actual seizures   . Sleep apnea    does not currently use due to insurance   . Vitamin D deficiency     PAST SURGICAL HISTORY: Past Surgical History:  Procedure Laterality Date  . CESAREAN SECTION    . CHOLECYSTECTOMY    . ESOPHAGOGASTRODUODENOSCOPY  2001 and 2018  . GANGLION CYST EXCISION Left 2014   foot  . KNEE ARTHROSCOPY Left 06/28/2017   Procedure: LEFT KNEE ARTHROSCOPY with debridement;  Surgeon: Kathryne Hitch, MD;  Location: [facility];  Service: Orthopedics;  Laterality: Left;    SOCIAL HISTORY: Social History   Tobacco Use  . Smoking status: Never Smoker  . Smokeless tobacco: Never Used  Substance Use Topics  . Alcohol use: Yes    Comment: occasioanlly   . Drug use: No    FAMILY HISTORY: Family History  Problem Relation Age of Onset  . Polycystic ovary syndrome Mother   . Hypothyroidism Mother   . Hyperlipidemia Mother   . Depression Mother   . Obesity Mother   . Polycystic ovary syndrome Sister   . Hypothyroidism Sister   . Hypertension Father   . Hyperlipidemia Father   . Cancer Father   . Depression Father   . Anxiety disorder Father   . Obesity Father     ROS: Review of Systems  Constitutional: Negative for weight loss.  Gastrointestinal: Negative for nausea and vomiting.  Musculoskeletal:       Negative muscle weakness    PHYSICAL EXAM: Blood pressure (!) 144/85, pulse 80, temperature 98.5 F (36.9 C), temperature source Oral, height 5\' 4"  (1.626 m), weight (!) 307 lb (139.3 kg), SpO2 96 %. Body mass index is 52.7 kg/m. Physical Exam  Constitutional: She is oriented to person, place, and  time. She appears well-developed and well-nourished.  Cardiovascular: Normal rate.  Pulmonary/Chest: Effort normal.  Musculoskeletal: Normal range of motion.  Neurological: She is oriented to person, place, and time.  Skin: Skin is warm and dry.  Psychiatric: She has a normal mood and affect. Her behavior is normal.  Vitals reviewed.   RECENT LABS AND TESTS: BMET    Component Value Date/Time   NA 138 10/22/2017 1236   K 4.3 10/22/2017 1236   CL 100 10/22/2017 1236   CO2 21 10/22/2017 1236  GLUCOSE 81 10/22/2017 1236   GLUCOSE 92 07/11/2017 1204   BUN 10 10/22/2017 1236   CREATININE 0.55 (L) 10/22/2017 1236   CALCIUM 9.1 10/22/2017 1236   GFRNONAA 121 10/22/2017 1236   GFRAA 140 10/22/2017 1236   Lab Results  Component Value Date   HGBA1C 4.9 10/22/2017   HGBA1C 5.1 07/11/2017   Lab Results  Component Value Date   INSULIN 8.8 10/22/2017   CBC    Component Value Date/Time   WBC 9.5 10/22/2017 1236   WBC 8.4 06/25/2017 1036   RBC 4.52 10/22/2017 1236   RBC 4.78 06/25/2017 1036   HGB 12.8 10/22/2017 1236   HCT 37.8 10/22/2017 1236   PLT 327 06/25/2017 1036   MCV 84 10/22/2017 1236   MCH 28.3 10/22/2017 1236   MCH 27.6 06/25/2017 1036   MCHC 33.9 10/22/2017 1236   MCHC 32.0 06/25/2017 1036   RDW 13.5 10/22/2017 1236   LYMPHSABS 1.7 10/22/2017 1236   EOSABS 0.2 10/22/2017 1236   BASOSABS 0.0 10/22/2017 1236   Iron/TIBC/Ferritin/ %Sat No results found for: IRON, TIBC, FERRITIN, IRONPCTSAT Lipid Panel     Component Value Date/Time   CHOL 182 10/22/2017 1236   TRIG 117 10/22/2017 1236   HDL 54 10/22/2017 1236   LDLCALC 105 (H) 10/22/2017 1236   Hepatic Function Panel     Component Value Date/Time   PROT 6.8 10/22/2017 1236   ALBUMIN 4.1 10/22/2017 1236   AST 13 10/22/2017 1236   ALT 11 10/22/2017 1236   ALKPHOS 61 10/22/2017 1236   BILITOT 0.5 10/22/2017 1236      Component Value Date/Time   TSH 2.950 10/22/2017 1236   TSH 1.94 07/11/2017 1204    Results for MONSERATH, NEFF (MRN [id number]) as of 12/23/2017 17:23  Ref. Range 10/22/2017 12:36  Vitamin D, 25-Hydroxy Latest Ref Range: 30.0 - 100.0 ng/mL 14.0 (L)    ASSESSMENT AND PLAN: Vitamin D deficiency - Plan: Vitamin D, Ergocalciferol, (DRISDOL) 50000 units CAPS capsule  PCOS (polycystic ovarian syndrome)  At risk for osteoporosis  Class 3 severe obesity with serious comorbidity and body mass index (BMI) of 50.0 to 59.9 in adult, unspecified obesity type (HCC)  PLAN:  Vitamin D Deficiency Michaela Holland was informed that low vitamin D levels contributes to fatigue and are associated with obesity, breast, and colon cancer. Ardith agrees to continue taking prescription Vit D @50 ,000 IU every week #4 and we will refill for 1 month. She will follow up for routine testing of vitamin D, at least 2-3 times per year. She was informed of the risk of over-replacement of vitamin D and agrees to not increase her dose unless she discusses this with [place] first. Betzaira agrees to follow up with our clinic in 2 weeks.  At risk for osteopenia and osteoporosis Michaela Holland is at risk for osteopenia and osteoporsis due to her vitamin D deficiency. She was encouraged to take her vitamin D and follow her higher calcium diet and increase strengthening exercise to help strengthen her bones and decrease her risk of osteopenia and osteoporosis.  Polycystic Ovary Syndrome Michaela Holland agrees to continue taking metformin 500 mg PO daily and she agrees to follow up with our clinic in 2 weeks.  Obesity Ting is currently in the action stage of change. As such, her goal is to continue with weight loss efforts She has agreed to follow the Category 3 plan + 300 calories Brenda has been instructed to work up to a goal of 150 minutes of combined  cardio and strengthening exercise per week for weight loss and overall health benefits. We discussed the following Behavioral Modification Strategies today: increasing lean protein intake, increasing  vegetables, decrease eating out, work on meal planning and easy cooking plans, and keep strict food journal   Michaela Holland has agreed to follow up with our clinic in 2 weeks. She was informed of the importance of frequent follow up visits to maximize her success with intensive lifestyle modifications for her multiple health conditions.   OBESITY BEHAVIORAL INTERVENTION VISIT  Today's visit was # 5 out of 22.  Starting weight: 313 lbs Starting date: 10/22/17 Today's weight : 307 lbs  Today's date: 12/23/2017 Total lbs lost to date: 6 (Patients must lose 7 lbs in the first 6 months to continue with counseling)   ASK: We discussed the diagnosis of obesity with Garald BaldingAnne K Hersch today and Michaela Holland agreed to give us permission to discuss obesity behavioral modification therapy today.  ASSESS: Michaela Holland has the diagnosis of obesity and her BMI today is 52.67 Michaela Holland is in the action stage of change   ADVISE: Michaela Holland was educated on the multiple health risks of obesity as well as the benefit of weight loss to improve her health. She was advised of the need for long term treatment and the importance of lifestyle modifications.  AGREE: Multiple dietary modification options and treatment options were discussed and  Michaela Holland agreed to the above obesity treatment plan.  I, Burt KnackSharon Martin, am acting as transcriptionist for Debbra RidingAlexandria Kadolph, MD  I have reviewed the above documentation for accuracy and completeness, and I agree with the above. - Debbra RidingAlexandria Kadolph, MD

## 2017-12-25 ENCOUNTER — Encounter (INDEPENDENT_AMBULATORY_CARE_PROVIDER_SITE_OTHER): Payer: Self-pay | Admitting: Orthopaedic Surgery

## 2017-12-25 ENCOUNTER — Ambulatory Visit (INDEPENDENT_AMBULATORY_CARE_PROVIDER_SITE_OTHER): Payer: 59 | Admitting: Orthopaedic Surgery

## 2017-12-25 DIAGNOSIS — G8929 Other chronic pain: Secondary | ICD-10-CM | POA: Diagnosis not present

## 2017-12-25 DIAGNOSIS — M25562 Pain in left knee: Secondary | ICD-10-CM

## 2017-12-25 NOTE — Progress Notes (Signed)
The patient is continue to rehabilitate her left knee.  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  Her knee hyperextends as does her other knee but this left knee did require arthroscopic surgery.  We found some cartilage change in the medial femoral condyle.  She is been working diligently to rehabilitate her knee.  She has a home exercise program.  She is worked on activity modification and trying diligently to work on weight loss.  She is moderately obese.  Her BMI that was last measured was 52.7.  Unfortunately knee braces is not been able to fit on her well due to her body habitus.  On exam the office today she still has global tenderness with medial lateral joint line tenderness of the left knee but the knee moves well and the patella tracks well.  She has slight resistance to pain on the lateral joint line with McMurray's exam.  Her knee does hyperextend as well.  There is no effusion.  We will continue to keep her out of work for the next 8 weeks as she continues to work vigorously on rehabilitating her left knee.  We will reevaluate her at that visit.  At some point we may want to repeat an MRI of her left knee given the continued issues with it.

## 2017-12-31 ENCOUNTER — Ambulatory Visit: Payer: Self-pay | Admitting: Osteopathic Medicine

## 2017-12-31 MED FILL — DICLOFENAC SOD EC 75 MG TAB: 75 | 30 days supply | Qty: 60 | Fill #1

## 2017-12-31 MED FILL — GABAPENTIN 300 MG CAPSULE: 300 | 15 days supply | Qty: 180 | Fill #0

## 2017-12-31 MED FILL — VIT D2 1.25 MG (50,000 UNIT: 1.25 MG | 28 days supply | Qty: 4 | Fill #0

## 2018-01-01 ENCOUNTER — Ambulatory Visit (INDEPENDENT_AMBULATORY_CARE_PROVIDER_SITE_OTHER): Payer: 59 | Admitting: Family Medicine

## 2018-01-01 ENCOUNTER — Encounter: Payer: Self-pay | Admitting: Family Medicine

## 2018-01-01 VITALS — BP 137/86 | HR 70 | Ht 64.02 in | Wt 312.0 lb

## 2018-01-01 DIAGNOSIS — M7122 Synovial cyst of popliteal space [Baker], left knee: Secondary | ICD-10-CM

## 2018-01-01 DIAGNOSIS — M7121 Synovial cyst of popliteal space [Baker], right knee: Secondary | ICD-10-CM | POA: Diagnosis not present

## 2018-01-01 NOTE — Progress Notes (Signed)
Michaela Holland is a 37 y.o. female who presents to [facility] today for left posterior knee swelling.  Michaela Holland has a long history of left knee pain due to DJD and chondromalacia.  She has initially not done very well following surgery but over time has had significant improvement of symptoms.  She still has knee pain but overall is improving and still attempting to return to work.  Her knee pain is primarily managed by Michaela Holland her orthopedic surgeon.  She notes however swelling at the posterior left knee.  She is noticed it recently but is not quite sure how long its been there.  She notes some soreness in the posterior knee that she thinks may be related to swelling.  She denies any radiating pain weakness or numbness down her legs.   Past Medical History:  Diagnosis Date  . Anxiety   . Back pain   . Chronic pain of left knee   . Depression   . Diabetes mellitus without complication (HCC)    denies this diagnosis   . Family history of adverse reaction to anesthesia    per patient ; father was having abdominal surgery and had to be placed on Holland to keep his blood pressure up; he never had any issues with low BP before the surgery "   . Gallbladder problem   . GERD (gastroesophageal reflux disease)   . Gout    believes only occurred once in feet;   . Hypertension    denies this diagnosis   . Hypothyroidism   . Joint pain   . Obesity   . Osteoarthritis   . Palpitations   . PCOS (polycystic ovarian syndrome)   . PONV (postoperative nausea and vomiting)    with c section; no issues with followwing procedures   . Seasonal allergies   . Seizures (HCC)    has had "pre-seizure" activity in the brain but deneis any actual seizures   . Sleep apnea    does not currently use due to insurance   . Vitamin D deficiency    Past Surgical History:  Procedure Laterality Date  . CESAREAN SECTION    . CHOLECYSTECTOMY    .  ESOPHAGOGASTRODUODENOSCOPY  2001 and 2018  . GANGLION CYST EXCISION Left 2014   foot  . KNEE ARTHROSCOPY Left 06/28/2017   Procedure: LEFT KNEE ARTHROSCOPY with debridement;  Surgeon: Michaela Holland;  Location: [facility];  Service: Orthopedics;  Laterality: Left;   Social History   Tobacco Use  . Smoking status: Never Smoker  . Smokeless tobacco: Never Used  Substance Use Topics  . Alcohol use: Yes    Comment: occasioanlly      ROS:  As above   Medications: Current Outpatient Medications  Medication Sig Dispense Refill  . albuterol (PROVENTIL HFA;VENTOLIN HFA) 108 (90 Base) MCG/ACT inhaler Inhale 1-2 puffs into the lungs every 4 (four) hours as needed for wheezing or shortness of breath (bronchospasm). 1 Inhaler 0  . Black Pepper-Turmeric (TURMERIC CURCUMIN) 01-999 MG CAPS Take 1 capsule by mouth daily.    . cefdinir (OMNICEF) 300 MG capsule Take 1 capsule (300 mg total) by mouth 2 (two) times daily. 14 capsule 0  . citalopram (CELEXA) 20 MG tablet Take 1 tablet (20 mg total) by mouth daily. 90 tablet 0  . clonazePAM (KLONOPIN) 0.5 MG tablet Take 0.5-1 tablets (0.25-0.5 mg total) by mouth 2 (two) times daily as needed for anxiety (#30 for 90 days.).  30 tablet 0  . Cyanocobalamin 2000 MCG TBCR Take 1 tablet (2,000 mcg total) by mouth daily. 30 tablet 0  . diclofenac (VOLTAREN) 75 MG EC tablet Take 1 tablet (75 mg total) by mouth 2 (two) times daily. 60 tablet 1  . gabapentin (NEURONTIN) 300 MG capsule One tab PO qHS for a week, then BID for a week, then TID. May double weekly to a max of 3,600mg /day 180 capsule 3  . Glucosamine-Chondroit-Vit C-Mn (GLUCOSAMINE CHONDR 1500 COMPLX PO) Take 1 capsule by mouth daily.    Marland Kitchen ibuprofen (ADVIL,MOTRIN) 200 MG tablet Take 800 mg by mouth every 8 (eight) hours as needed (for pain.).    Marland Kitchen ipratropium (ATROVENT) 0.06 % nasal spray Place 2 sprays into both nostrils 4 (four) times daily as needed for rhinitis. 15 mL 0  . metFORMIN  (GLUCOPHAGE-XR) 500 MG 24 hr tablet Take 1 tablet (500 mg total) by mouth at bedtime. (Patient taking differently: Take 1,000 mg by mouth at bedtime. ) 90 tablet 3  . omeprazole (PRILOSEC) 40 MG capsule Take 1 capsule (40 mg total) by mouth daily. 30 capsule 6  . predniSONE (DELTASONE) 50 MG tablet Take 1 tablet (50 mg total) by mouth daily. 5 tablet 0  . ranitidine (ZANTAC) 150 MG tablet Take 150 mg by mouth daily as needed for heartburn.     . Vitamin D, Ergocalciferol, (DRISDOL) 50000 units CAPS capsule Take 1 capsule (50,000 Units total) by mouth every 7 (seven) days. 4 capsule 0   No current facility-administered medications for this visit.    Allergies  Allergen Reactions  . Sulfa Antibiotics Hives and Shortness Of Breath  . Other     Nuts severe reaction difficulty breathing , throat swelling; mostly tree nuts but not all tree nuts      Exam:  BP 137/86   Pulse 70   Ht 5' 4.02" (1.626 m)   Wt (!) 312 lb (141.5 kg)   SpO2 100%   BMI 53.53 kg/m  General: Well Developed, well nourished, and in no acute distress.  Morbidly obese Neuro/Psych: Alert and oriented x3, extra-ocular muscles intact, able to move all 4 extremities, sensation grossly intact. Skin: Warm and dry, no rashes noted.  Respiratory: Not using accessory muscles, speaking in full sentences, trachea midline.  Cardiovascular: Pulses palpable, no extremity edema. Abdomen: Does not appear distended. MSK:  Left knee no obvious effusion.  Exam limited by body habitus. No skin erythema. Posterior knee vague difficult to palpate swelling along the posterior aspect of the knee. Knee range of motion is intact and stability is intact.  Limited musculoskeletal ultrasound of the posterior knee shows a loculated Baker's cyst measuring approximately 1 x 3 cm. No bony abnormalities noted.  Procedure: Real-time Ultrasound Guided aspiration and injection of left knee Baker's cyst Device: GE Logiq E   Images permanently stored  and available for review in the ultrasound unit. Verbal informed consent obtained.  Discussed risks and benefits of procedure. Warned about infection bleeding damage to structures skin hypopigmentation and fat atrophy among others. Patient expresses understanding and agreement Time-out conducted.   Noted no overlying erythema, induration, or other signs of local infection.   Skin prepped in a sterile fashion.   Local anesthesia: Topical Ethyl chloride.   With sterile technique and under real time ultrasound guidance:  1.5 mL of lidocaine injected subcutaneously and along the subcutaneous tissues extending into the Baker's cyst achieving green anesthesia.   The skin was then again cleaned with chlorhexidine and  an 18-gauge needle under ultrasound guidance used to access the cystic structure.  5 mL of thick clear gelatinous material aspirated. The syringe was then  exchanged while the needle was held in place and 3 mL of Marcaine and 80 mg of Depo-Medrol was injected into the Baker's cyst Completed without difficulty   Pain immediately resolved suggesting accurate placement of the medication.   Advised to call if fevers/chills, erythema, induration, drainage, or persistent bleeding.   Images permanently stored and available for review in the ultrasound unit.  Impression: Technically successful ultrasound guided injection.     Assessment and Plan: 36 y.o. female with left knee swelling due to Baker's cyst as seen on ultrasound today.  Status post aspiration and drainage.  I was unable to aspirate the entirety of the Baker's cyst as it was loculated and the fluid contents were thick and gelatinous.  Plan for injection as noted above and relative rest.  Recheck with orthopedic surgery as needed if not improving.   Discussed warning signs or symptoms. Please see discharge instructions. Patient expresses understanding.  CC: Dr Magnus Ivan

## 2018-01-01 NOTE — Patient Instructions (Signed)
Thank you for coming in today. Call or go to the ER if you develop a large red swollen joint with extreme pain or oozing puss.  I will send a note to Dr Magnus Ivan.  Recheck as needed.  Continue exercises.    Baker Cyst A Baker cyst, also called a popliteal cyst, is a sac-like growth that forms at the back of the knee. The cyst forms when the fluid-filled sac (bursa) that cushions the knee joint becomes enlarged. The bursa that becomes a Baker cyst is located at the back of the knee joint. What are the causes? In most cases, a Baker cyst results from another knee problem that causes swelling inside the knee. This makes the fluid inside the knee joint (synovial fluid) flow into the bursa behind the knee, causing the bursa to enlarge. What increases the risk? You may be more likely to develop a Baker cyst if you already have a knee problem, such as:  A tear in cartilage that cushions the knee joint (meniscal tear).  A tear in the tissues that connect the bones of the knee joint (ligament tear).  Knee swelling from osteoarthritis, rheumatoid arthritis, or gout.  What are the signs or symptoms? A Baker cyst does not always cause symptoms. A lump behind the knee may be the only sign of the condition. The lump may be painful, especially when the knee is straightened. If the lump is painful, the pain may come and go. The knee may also be stiff. Symptoms may quickly get more severe if the cyst breaks open (ruptures). If your cyst ruptures, signs and symptoms may affect the knee and the back of the lower leg (calf) and may include:  Sudden or worsening pain.  Swelling.  Bruising.  How is this diagnosed? This condition may be diagnosed based on your symptoms and medical history. Your health care provider will also do a physical exam. This may include:  Feeling the cyst to check whether it is tender.  Checking your knee for signs of another knee condition that causes swelling.  You may have  imaging tests, such as:  X-rays.  MRI.  Ultrasound.  How is this treated? A Baker cyst that is not painful may go away without treatment. If the cyst gets large or painful, it will likely get better if the underlying knee problem is treated. Treatment for a Baker cyst may include:  Resting.  Keeping weight off of the knee. This means not leaning on the knee to support your body weight.  NSAIDs to reduce pain and swelling.  A procedure to drain the fluid from the cyst with a needle (aspiration). You may also get an injection of a medicine that reduces swelling (steroid).  Surgery. This may be needed if other treatments do not work. This usually involves correcting knee damage and removing the cyst.  Follow these instructions at home:  Take over-the-counter and prescription medicines only as told by your health care provider.  Rest and return to your normal activities as told by your health care provider. Avoid activities that make pain or swelling worse. Ask your health care provider what activities are safe for you.  Keep all follow-up visits as told by your health care provider. This is important. Contact a health care provider if:  You have knee pain, stiffness, or swelling that does not get better. Get help right away if:  You have sudden or worsening pain and swelling in your calf area. This information is not intended to  replace advice given to you by your health care provider. Make sure you discuss any questions you have with your health care provider. Document Released: 08/27/2005 Document Revised: 05/17/2016 Document Reviewed: 05/17/2016 Elsevier Interactive Patient Education  2018 [employer]

## 2018-01-04 ENCOUNTER — Encounter (INDEPENDENT_AMBULATORY_CARE_PROVIDER_SITE_OTHER): Payer: Self-pay | Admitting: Family Medicine

## 2018-01-07 ENCOUNTER — Encounter (INDEPENDENT_AMBULATORY_CARE_PROVIDER_SITE_OTHER): Payer: Self-pay

## 2018-01-07 ENCOUNTER — Ambulatory Visit (INDEPENDENT_AMBULATORY_CARE_PROVIDER_SITE_OTHER): Payer: 59 | Admitting: Family Medicine

## 2018-01-08 ENCOUNTER — Ambulatory Visit: Payer: Self-pay | Admitting: Endocrinology

## 2018-01-09 ENCOUNTER — Encounter: Payer: Self-pay | Admitting: Osteopathic Medicine

## 2018-01-09 ENCOUNTER — Other Ambulatory Visit (HOSPITAL_COMMUNITY)
Admission: RE | Admit: 2018-01-09 | Discharge: 2018-01-09 | Disposition: A | Discharge status: Home / Self Care | Payer: 59 | Source: Ambulatory Visit | Attending: Osteopathic Medicine | Admitting: Osteopathic Medicine

## 2018-01-09 ENCOUNTER — Ambulatory Visit (INDEPENDENT_AMBULATORY_CARE_PROVIDER_SITE_OTHER): Payer: 59 | Admitting: Osteopathic Medicine

## 2018-01-09 VITALS — BP 143/60 | HR 74 | Temp 98.2°F | Wt 315.0 lb

## 2018-01-09 DIAGNOSIS — E282 Polycystic ovarian syndrome: Secondary | ICD-10-CM

## 2018-01-09 DIAGNOSIS — E559 Vitamin D deficiency, unspecified: Secondary | ICD-10-CM | POA: Diagnosis not present

## 2018-01-09 DIAGNOSIS — R102 Pelvic and perineal pain: Secondary | ICD-10-CM | POA: Diagnosis not present

## 2018-01-09 DIAGNOSIS — Z124 Encounter for screening for malignant neoplasm of cervix: Secondary | ICD-10-CM | POA: Diagnosis not present

## 2018-01-09 DIAGNOSIS — Z6841 Body Mass Index (BMI) 40.0 and over, adult: Secondary | ICD-10-CM

## 2018-01-09 DIAGNOSIS — Z0000 Encounter for general adult medical examination without abnormal findings: Secondary | ICD-10-CM

## 2018-01-09 DIAGNOSIS — N921 Excessive and frequent menstruation with irregular cycle: Secondary | ICD-10-CM | POA: Diagnosis not present

## 2018-01-09 DIAGNOSIS — E66813 Obesity, class 3: Secondary | ICD-10-CM

## 2018-01-09 MED ORDER — NYSTATIN POWD: 11 refills | Status: DC

## 2018-01-09 NOTE — Progress Notes (Signed)
HPI: Michaela Holland is a 37 y.o. female who  has a past medical history of Anxiety, Back pain, Chronic pain of left knee, Depression, Diabetes mellitus without complication (HCC), Family history of adverse reaction to anesthesia, Gallbladder problem, GERD (gastroesophageal reflux disease), Gout, Hypertension, Hypothyroidism, Joint pain, Obesity, Osteoarthritis, Palpitations, PCOS (polycystic ovarian syndrome), PONV (postoperative nausea and vomiting), Seasonal allergies, Seizures (HCC), Sleep apnea, and Vitamin D deficiency.  she presents to [facility] today, 01/09/18,  for chief complaint of: Physical  Patient here for annual physical / wellness exam.  See preventive care reviewed as below.  Recent labs reviewed in detail with the patient.   Additional concerns today include:   Spotting vaginally for few weeks now, L ovarian pain, hx PCOS. Home pregnancy testing is negative (not actively trying)      Past medical, surgical, social and family history reviewed:  Patient Active Problem List   Diagnosis Date Noted  . Baker cyst, left 01/01/2018  . Other fatigue 10/22/2017  . Shortness of breath on exertion 10/22/2017  . Vitamin D deficiency 10/22/2017  . S/P arthroscopy of left knee 07/18/2017  . Acute pain of left knee 06/17/2017  . Meniscal cyst, left 06/17/2017  . Cyst of lateral meniscus of left knee 05/08/2017  . Anterior cruciate ligament sprain 05/08/2017  . Morbid obesity (HCC) 05/08/2017  . History of obstructive sleep apnea 04/09/2017  . Hypothyroidism 04/09/2017  . PCOS (polycystic ovarian syndrome) 04/09/2017  . Anxiety 04/09/2017  . Chronic pain of left knee 04/09/2017  . Gastroesophageal reflux disease with esophagitis 04/09/2017    Past Surgical History:  Procedure Laterality Date  . CESAREAN SECTION    . CHOLECYSTECTOMY    . ESOPHAGOGASTRODUODENOSCOPY  2001 and 2018  . GANGLION CYST EXCISION Left 2014   foot  . KNEE  ARTHROSCOPY Left 06/28/2017   Procedure: LEFT KNEE ARTHROSCOPY with debridement;  Surgeon: Kathryne Hitch, MD;  Location: [facility];  Service: Orthopedics;  Laterality: Left;    Social History   Tobacco Use  . Smoking status: Never Smoker  . Smokeless tobacco: Never Used  Substance Use Topics  . Alcohol use: Yes    Comment: occasioanlly     Family History  Problem Relation Age of Onset  . Polycystic ovary syndrome Mother   . Hypothyroidism Mother   . Hyperlipidemia Mother   . Depression Mother   . Obesity Mother   . Polycystic ovary syndrome Sister   . Hypothyroidism Sister   . Hypertension Father   . Hyperlipidemia Father   . Cancer Father   . Depression Father   . Anxiety disorder Father   . Obesity Father      Current medication list and allergy/intolerance information reviewed:    Current Outpatient Medications  Medication Sig Dispense Refill  . albuterol (PROVENTIL HFA;VENTOLIN HFA) 108 (90 Base) MCG/ACT inhaler Inhale 1-2 puffs into the lungs every 4 (four) hours as needed for wheezing or shortness of breath (bronchospasm). 1 Inhaler 0  . Black Pepper-Turmeric (TURMERIC CURCUMIN) 01-999 MG CAPS Take 1 capsule by mouth daily.    . citalopram (CELEXA) 20 MG tablet Take 1 tablet (20 mg total) by mouth daily. 90 tablet 0  . clonazePAM (KLONOPIN) 0.5 MG tablet Take 0.5-1 tablets (0.25-0.5 mg total) by mouth 2 (two) times daily as needed for anxiety (#30 for 90 days.). 30 tablet 0  . Cyanocobalamin 2000 MCG TBCR Take 1 tablet (2,000 mcg total) by mouth daily. 30 tablet 0  .  diclofenac (VOLTAREN) 75 MG EC tablet Take 1 tablet (75 mg total) by mouth 2 (two) times daily. 60 tablet 1  . gabapentin (NEURONTIN) 300 MG capsule One tab PO qHS for a week, then BID for a week, then TID. May double weekly to a max of 3,600mg /day 180 capsule 3  . Glucosamine-Chondroit-Vit C-Mn (GLUCOSAMINE CHONDR 1500 COMPLX PO) Take 1 capsule by mouth daily.    Marland Kitchen ibuprofen (ADVIL,MOTRIN) 200 MG  tablet Take 800 mg by mouth every 8 (eight) hours as needed (for pain.).    Marland Kitchen metFORMIN (GLUCOPHAGE-XR) 500 MG 24 hr tablet Take 1 tablet (500 mg total) by mouth at bedtime. (Patient taking differently: Take 1,000 mg by mouth at bedtime. ) 90 tablet 3  . omeprazole (PRILOSEC) 40 MG capsule Take 1 capsule (40 mg total) by mouth daily. 30 capsule 6  . ranitidine (ZANTAC) 150 MG tablet Take 150 mg by mouth daily as needed for heartburn.     . Vitamin D, Ergocalciferol, (DRISDOL) 50000 units CAPS capsule Take 1 capsule (50,000 Units total) by mouth every 7 (seven) days. 4 capsule 0   No current facility-administered medications for this visit.     Allergies  Allergen Reactions  . Sulfa Antibiotics Hives and Shortness Of Breath  . Other     Nuts severe reaction difficulty breathing , throat swelling; mostly tree nuts but not all tree nuts       Review of Systems:  Constitutional:  No  fever, no chills, No recent illness, No unintentional weight changes. No significant fatigue.   HEENT: No  headache, no vision change, no hearing change, No sore throat, No  sinus pressure  Cardiac: No  chest pain, No  pressure, No palpitations, No  Orthopnea  Respiratory:  No  shortness of breath. No  Cough  Gastrointestinal: No  abdominal pain, No  nausea, No  vomiting,  No  blood in stool, No  diarrhea, No  constipation   Musculoskeletal: No new myalgia/arthralgia  Skin: No  Rash  Genitourinary: No  incontinence, No  abnormal genital bleeding, No abnormal genital discharge  Hem/Onc: No  easy bruising/bleeding  Endocrine: No polyuria/polydipsia/polyphagia   Neurologic: No  weakness, No  dizziness  Psychiatric: No  concerns with depression, No  concerns with anxiety, No sleep problems, No mood problems  Exam:  BP (!) 143/60 (BP Location: Left Arm, Patient Position: Sitting, Cuff Size: Large)   Pulse 74   Temp 98.2 F (36.8 C) (Oral)   Wt (!) 315 lb (142.9 kg)   LMP 12/30/2017   BMI 54.04  kg/m   Constitutional: VS see above. General Appearance: alert, well-developed, well-nourished, NAD  Eyes: Normal lids and conjunctive, non-icteric sclera  Ears, Nose, Mouth, Throat: MMM, Normal external inspection ears/nares/mouth/lips/gums. TM normal bilaterally. Pharynx/tonsils no erythema, no exudate. Nasal mucosa normal.   Neck: No masses, trachea midline. No thyroid enlargement. No tenderness/mass appreciated. No lymphadenopathy  Respiratory: Normal respiratory effort. no wheeze, no rhonchi, no rales  Cardiovascular: S1/S2 normal, no murmur, no rub/gallop auscultated. RRR. No lower extremity edema.  Gastrointestinal: Nontender, no masses. Habitus limits exam.   Musculoskeletal: Gait normal. No clubbing/cyanosis of digits.   Neurological: Normal balance/coordination. No tremor.   Skin: warm, dry, intact. No rash/ulcer. No concerning nevi or subq nodules on limited exam.  Psychiatric: Normal judgment/insight. Normal mood and affect. Oriented x3.  GYN: No lesions/ulcers to external genitalia, normal urethra, normal vaginal mucosa, physiologic discharge, cervix normal without lesions, uterus not enlarged or tender, adnexa no masses  and nontender  BREAST: No rashes/skin changes, normal fibrous breast tissue, no masses or tenderness, normal nipple without discharge, normal axilla      ASSESSMENT/PLAN:   Annual physical exam  Cervical cancer screening - Plan: Cytology - PAP  Vitamin D deficiency  Class 3 severe obesity with serious comorbidity and body mass index (BMI) of 50.0 to 59.9 in adult, unspecified obesity type (HCC)  PCOS (polycystic ovarian syndrome) - Plan: US PELVIS (TRANSABDOMINAL ONLY), US Pelvis Complete  Pelvic pain - Plan: US PELVIS (TRANSABDOMINAL ONLY), US Pelvis Complete  Irregular intermenstrual bleeding - Plan: US PELVIS (TRANSABDOMINAL ONLY), US Pelvis Complete   FEMALE PREVENTIVE CARE Updated 01/09/18   ANNUAL  SCREENING/COUNSELING  Diet/Exercise - HEALTHY HABITS DISCUSSED TO DECREASE CV RISK Social History   Tobacco Use  Smoking Status Never Smoker  Smokeless Tobacco Never Used    Social History   Substance and Sexual Activity  Alcohol Use Yes   Comment: occasioanlly     Depression screen PHQ 2/9 01/09/2018  Decreased Interest 1  Down, Depressed, Hopeless 1  PHQ - 2 Score 2  Altered sleeping 0  Tired, decreased energy 3  Change in appetite 2  Feeling bad or failure about yourself  1  Trouble concentrating 0  Moving slowly or fidgety/restless 0  Suicidal thoughts 0  PHQ-9 Score 8  Difficult doing work/chores Somewhat difficult    Domestic violence concerns - no  HTN SCREENING - SEE VITALS  SEXUAL HEALTH  Sexually active in the past year - Yes with female. Married, monogamous  Need/want STI testing today? - no  Plans for pregnancy? - none now, would be ok if she were to get pregnant, on no contraception   INFECTIOUS DISEASE SCREENING  HIV - does not need - declined  GC/CT - does not need  HepC - DOB [birth year] - does not need  TB - does not need  DISEASE SCREENING  Lipid - does not need - already done  DM2 - does not need - already done  Osteoporosis - women age 7+ - does not need  CANCER SCREENING  Cervical - needs  Breast - does not need  Lung - does not need  Colon - does not need - dad has polyps but no FH colon cancer   ADULT VACCINATION  Influenza - annual vaccine recommended  Td - booster every 10 years   Zoster - Shingrix recommended 50+  PCV13 - was not indicated  PPSV23 - was not indicated Immunization History  Administered Date(s) Administered  . Influenza-Unspecified 05/11/2017  . Tdap 03/04/2015      Visit summary with medication list and pertinent instructions was printed for patient to review. All questions at time of visit were answered - patient instructed to contact office with any additional concerns. ER/RTC precautions  were reviewed with the patient.   Follow-up plan: Return in about 1 year (around 01/10/2019) for annual physical / preventive care, sooner if needed .    Please note: voice recognition software was used to produce this document, and typos may escape review. Please contact Dr. Lyn Hollingshead for any needed clarifications.

## 2018-01-14 LAB — CYTOLOGY - PAP
Diagnosis: NEGATIVE
HPV: NOT DETECTED

## 2018-01-15 ENCOUNTER — Encounter: Payer: Self-pay | Admitting: Osteopathic Medicine

## 2018-01-15 DIAGNOSIS — R102 Pelvic and perineal pain: Secondary | ICD-10-CM

## 2018-01-16 ENCOUNTER — Other Ambulatory Visit: Payer: Self-pay

## 2018-01-16 ENCOUNTER — Ambulatory Visit (INDEPENDENT_AMBULATORY_CARE_PROVIDER_SITE_OTHER): Payer: 59

## 2018-01-16 DIAGNOSIS — R9389 Abnormal findings on diagnostic imaging of other specified body structures: Secondary | ICD-10-CM | POA: Diagnosis not present

## 2018-01-16 DIAGNOSIS — R102 Pelvic and perineal pain: Secondary | ICD-10-CM | POA: Diagnosis not present

## 2018-01-17 LAB — URINALYSIS, ROUTINE W REFLEX MICROSCOPIC
Bilirubin Urine: NEGATIVE
GLUCOSE, UA: NEGATIVE
HGB URINE DIPSTICK: NEGATIVE
Ketones, ur: NEGATIVE
LEUKOCYTES UA: NEGATIVE
NITRITE: NEGATIVE
PROTEIN: NEGATIVE
Specific Gravity, Urine: 1.006 (ref 1.001–1.03)
pH: 5.5 (ref 5.0–8.0)

## 2018-01-21 ENCOUNTER — Ambulatory Visit (INDEPENDENT_AMBULATORY_CARE_PROVIDER_SITE_OTHER): Payer: 59 | Admitting: Licensed Clinical Social Worker

## 2018-01-21 ENCOUNTER — Encounter (HOSPITAL_COMMUNITY): Payer: Self-pay | Admitting: Licensed Clinical Social Worker

## 2018-01-21 DIAGNOSIS — F331 Major depressive disorder, recurrent, moderate: Secondary | ICD-10-CM | POA: Diagnosis not present

## 2018-01-21 NOTE — Progress Notes (Signed)
   THERAPIST PROGRESS NOTE  Session Time: 11:10-12pm  Participation Level: Active  Behavioral Response: CasualAlertAnxious  Type of Therapy: Individual Therapy  Treatment Goals addressed: depressed  Interventions: CBT  Summary: Michaela [facility] is a 37 y.o. female who presents for her individual counseling session. Pt discussed her  psychiatric symptoms and current life events. 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 She has 1 more dr appt in 30 days with a MRI before the dr makes the final decision about her RTW status. Asked open ended questions about her thoughts and feelings about disability and RTW status. Pt reports her moods are fluctuating and she remains irritable. Pt is taking time for herself and her self care. Psychoeducation on depression and depressive symptoms. Asked open ended questions about the continued family conflict: financially, communication, intimacy, physical impairment. Asked open ended questions and used empathic reflection. Pt desires to continue in therapy regularly.   Suicidal/Homicidal: Nowithout intent/plan  Therapist Response:  Assessed pt's current functioning and reviewed progress. Assisted pt continued family conflict, return to work, self care depressive symptoms. Assisted pt processing for the management of stressors.  Plan: Return again in 2 weeks. Talk about journaling and meditation  Diagnosis: Axis I: Moderate episode of recurrent major depressive disorder    Tadan Shill S, LCAS 01/21/2018

## 2018-01-22 ENCOUNTER — Ambulatory Visit (INDEPENDENT_AMBULATORY_CARE_PROVIDER_SITE_OTHER): Payer: 59 | Admitting: Family Medicine

## 2018-01-22 VITALS — BP 118/82 | HR 83 | Temp 97.8°F | Ht 64.0 in | Wt 303.0 lb

## 2018-01-22 DIAGNOSIS — Z9189 Other specified personal risk factors, not elsewhere classified: Secondary | ICD-10-CM | POA: Diagnosis not present

## 2018-01-22 DIAGNOSIS — E559 Vitamin D deficiency, unspecified: Secondary | ICD-10-CM

## 2018-01-22 DIAGNOSIS — E038 Other specified hypothyroidism: Secondary | ICD-10-CM

## 2018-01-22 DIAGNOSIS — Z6841 Body Mass Index (BMI) 40.0 and over, adult: Secondary | ICD-10-CM

## 2018-01-22 NOTE — Progress Notes (Signed)
Office: [phone]  /  Fax: [phone]   HPI:   Chief Complaint: OBESITY Michaela Holland is here to discuss her progress with her obesity treatment plan. She is on the Category 3 plan + 300 calories and is following her eating plan approximately 25 % of the time. She states she is walking for 10-15 minutes 5 times per week. Michaela Holland for the first weeks she struggled with meal plan but got back on it in the past 5 days. Trying intermittent fasting.  Her weight is (!) 303 lb (137.4 kg) today and has had a weight loss of 4 pounds over a period of 4 weeks since her last visit. She has lost 10 lbs since starting treatment with [place].  Vitamin D Deficiency Michaela Holland has a diagnosis of vitamin D deficiency. She is currently taking prescription Vit D. She notes fatigue and denies nausea, vomiting or muscle weakness.  At risk for osteopenia and osteoporosis Michaela Holland is at higher risk of osteopenia and osteoporosis due to vitamin D deficiency.   Hypothyroidism Michaela Holland has a diagnosis of hypothyroidism. She just started levothyroxine again ( ). She denies hot or cold intolerance or palpitations, but does admit to ongoing fatigue.  ALLERGIES: Allergies  Allergen Reactions  . Sulfa Antibiotics Hives and Shortness Of Breath  . Food     Allergy to walnuts, pecans, [place] nuts, hazel nuts (she can eat other nuts).  . Kelp-B6-Lecithin-Vinegar   . Other     Nuts severe reaction difficulty breathing , throat swelling; mostly tree nuts but not all tree nuts     MEDICATIONS: Current Outpatient Medications on File Prior to Visit  Medication Sig Dispense Refill  . albuterol (PROVENTIL HFA;VENTOLIN HFA) 108 (90 Base) MCG/ACT inhaler Inhale 1-2 puffs into the lungs every 4 (four) hours as needed for wheezing or shortness of breath (bronchospasm). 1 Inhaler 0  . Black Pepper-Turmeric (TURMERIC CURCUMIN) 01-999 MG CAPS Take 1 capsule by mouth daily.    . citalopram (CELEXA) 20 MG tablet Take 1 tablet (20 mg total) by mouth  daily. 90 tablet 0  . clonazePAM (KLONOPIN) 0.5 MG tablet Take 0.5-1 tablets (0.25-0.5 mg total) by mouth 2 (two) times daily as needed for anxiety (#30 for 90 days.). 30 tablet 0  . Cyanocobalamin 2000 MCG TBCR Take 1 tablet (2,000 mcg total) by mouth daily. 30 tablet 0  . diclofenac (VOLTAREN) 75 MG EC tablet Take 1 tablet (75 mg total) by mouth 2 (two) times daily. 60 tablet 1  . gabapentin (NEURONTIN) 300 MG capsule One tab PO qHS for a week, then BID for a week, then TID. May double weekly to a max of 3,600mg /day 180 capsule 3  . Glucosamine-Chondroit-Vit C-Mn (GLUCOSAMINE CHONDR 1500 COMPLX PO) Take 1 capsule by mouth daily.    Marland Kitchen ibuprofen (ADVIL,MOTRIN) 200 MG tablet Take 800 mg by mouth every 8 (eight) hours as needed (for pain.).    Marland Kitchen metFORMIN (GLUCOPHAGE-XR) 500 MG 24 hr tablet Take 1 tablet (500 mg total) by mouth at bedtime. (Patient taking differently: Take 1,000 mg by mouth at bedtime. ) 90 tablet 3  . Nystatin POWD Apply liberally to affected area 2 times per day 1 Bottle 11  . omeprazole (PRILOSEC) 40 MG capsule Take 1 capsule (40 mg total) by mouth daily. 30 capsule 6  . ranitidine (ZANTAC) 150 MG tablet Take 150 mg by mouth daily as needed for heartburn.     . Vitamin D, Ergocalciferol, (DRISDOL) 50000 units CAPS capsule Take 1 capsule (50,000 Units total) by  mouth every 7 (seven) days. 4 capsule 0   No current facility-administered medications on file prior to visit.     PAST MEDICAL HISTORY: Past Medical History:  Diagnosis Date  . Anxiety   . Back pain   . Chronic pain of left knee   . Depression   . Diabetes mellitus without complication (HCC)    denies this diagnosis   . Family history of adverse reaction to anesthesia    per patient ; father was having abdominal surgery and had to be placed on medicine to keep his blood pressure up; he never had any issues with low BP before the surgery "   . Gallbladder problem   . GERD (gastroesophageal reflux disease)   . Gout     believes only occurred once in feet;   . Hypertension    denies this diagnosis   . Hypothyroidism   . Joint pain   . Obesity   . Osteoarthritis   . Palpitations   . PCOS (polycystic ovarian syndrome)   . PONV (postoperative nausea and vomiting)    with c section; no issues with followwing procedures   . Seasonal allergies   . Seizures (HCC)    has had "pre-seizure" activity in the brain but deneis any actual seizures   . Sleep apnea    does not currently use due to insurance   . Vitamin D deficiency     PAST SURGICAL HISTORY: Past Surgical History:  Procedure Laterality Date  . CESAREAN SECTION    . CHOLECYSTECTOMY    . ESOPHAGOGASTRODUODENOSCOPY  2001 and 2018  . GANGLION CYST EXCISION Left 2014   foot  . KNEE ARTHROSCOPY Left 06/28/2017   Procedure: LEFT KNEE ARTHROSCOPY with debridement;  Surgeon: Kathryne Hitch, MD;  Location: [facility];  Service: Orthopedics;  Laterality: Left;    SOCIAL HISTORY: Social History   Tobacco Use  . Smoking status: Never Smoker  . Smokeless tobacco: Never Used  Substance Use Topics  . Alcohol use: Yes    Comment: occasioanlly   . Drug use: No    FAMILY HISTORY: Family History  Problem Relation Age of Onset  . Polycystic ovary syndrome Mother   . Hypothyroidism Mother   . Hyperlipidemia Mother   . Depression Mother   . Obesity Mother   . Polycystic ovary syndrome Sister   . Hypothyroidism Sister   . Hypertension Father   . Hyperlipidemia Father   . Cancer Father   . Depression Father   . Anxiety disorder Father   . Obesity Father     ROS: Review of Systems  Constitutional: Positive for malaise/fatigue and weight loss.  Cardiovascular: Negative for palpitations.  Gastrointestinal: Negative for nausea and vomiting.  Musculoskeletal:       Negative muscle weakness  Endo/Heme/Allergies:       Negative hot/cold intolerance    PHYSICAL EXAM: Blood pressure 118/82, pulse 83, temperature 97.8 F (36.6 C),  temperature source Oral, height  (1.626 m), weight (!) 303 lb (137.4 kg), last menstrual period 12/30/2017, SpO2 95 %. Body mass index is 52.01 kg/m. Physical Exam  Constitutional: She is oriented to person, place, and time. She appears well-developed and well-nourished.  Cardiovascular: Normal rate.  Pulmonary/Chest: Effort normal.  Musculoskeletal: Normal range of motion.  Neurological: She is oriented to person, place, and time.  Skin: Skin is warm and dry.  Psychiatric: She has a normal mood and affect. Her behavior is normal.  Vitals reviewed.   RECENT LABS AND  TESTS: BMET    Component Value Date/Time   NA 138 10/22/2017 1236   K 4.3 10/22/2017 1236   CL 100 10/22/2017 1236   CO2 21 10/22/2017 1236   GLUCOSE 81 10/22/2017 1236   GLUCOSE 92 07/11/2017 1204   BUN 10 10/22/2017 1236   CREATININE 0.55 (L) 10/22/2017 1236   CALCIUM 9.1 10/22/2017 1236   GFRNONAA 121 10/22/2017 1236   GFRAA 140 10/22/2017 1236   Lab Results  Component Value Date   HGBA1C 4.9 10/22/2017   HGBA1C 5.1 07/11/2017   Lab Results  Component Value Date   INSULIN 8.8 10/22/2017   CBC    Component Value Date/Time   WBC 9.5 10/22/2017 1236   WBC 8.4 06/25/2017 1036   RBC 4.52 10/22/2017 1236   RBC 4.78 06/25/2017 1036   HGB 12.8 10/22/2017 1236   HCT 37.8 10/22/2017 1236   PLT 327 06/25/2017 1036   MCV 84 10/22/2017 1236   MCH 28.3 10/22/2017 1236   MCH 27.6 06/25/2017 1036   MCHC 33.9 10/22/2017 1236   MCHC 32.0 06/25/2017 1036   RDW 13.5 10/22/2017 1236   LYMPHSABS 1.7 10/22/2017 1236   EOSABS 0.2 10/22/2017 1236   BASOSABS 0.0 10/22/2017 1236   Iron/TIBC/Ferritin/ %Sat No results found for: IRON, TIBC, FERRITIN, IRONPCTSAT Lipid Panel     Component Value Date/Time   CHOL 182 10/22/2017 1236   TRIG 117 10/22/2017 1236   HDL 54 10/22/2017 1236   LDLCALC 105 (H) 10/22/2017 1236   Hepatic Function Panel     Component Value Date/Time   PROT 6.8 10/22/2017 1236   ALBUMIN  4.1 10/22/2017 1236   AST 13 10/22/2017 1236   ALT 11 10/22/2017 1236   ALKPHOS 61 10/22/2017 1236   BILITOT 0.5 10/22/2017 1236      Component Value Date/Time   TSH 2.950 10/22/2017 1236   TSH 1.94 07/11/2017 1204  Results for WILLONA, PHARISS (MRN [id number]) as of 01/23/2018 08:02  Ref. Range 10/22/2017 12:36  Vitamin D, 25-Hydroxy Latest Ref Range: 30.0 - 100.0 ng/mL 14.0 (L)    ASSESSMENT AND PLAN: Vitamin D deficiency - Plan: Vitamin D, Ergocalciferol, (DRISDOL) 50000 units CAPS capsule  Other specified hypothyroidism  At risk for osteoporosis  Class 3 severe obesity with serious comorbidity and body mass index (BMI) of 50.0 to 59.9 in adult, unspecified obesity type (HCC)  PLAN:  Vitamin D Deficiency Michaela Holland was informed that low vitamin D levels contributes to fatigue and are associated with obesity, breast, and colon cancer. Michaela Holland agrees to continue taking prescription Vit D ,000 IU every week #4 and we will refill for 1 month. She will follow up for routine testing of vitamin D, at least 2-3 times per year. She was informed of the risk of over-replacement of vitamin D and agrees to not increase her dose unless she discusses this with [place] first. Armeda agrees to follow up with our clinic in 2 weeks.  At risk for osteopenia and osteoporosis Michaela Holland is at risk for osteopenia and osteoporsis due to her vitamin D deficiency. She was encouraged to take her vitamin D and follow her higher calcium diet and increase strengthening exercise to help strengthen her bones and decrease her risk of osteopenia and osteoporosis.  Hypothyroidism Michaela Holland was informed of the importance of good thyroid control to help with weight loss efforts. She was also informed that supertheraputic thyroid levels are dangerous and will not improve weight loss results. Will need thyroid panel at next appointment. Michaela Holland  agrees to follow up with our clinic in 2 weeks.  Obesity Michaela Holland is currently in the action stage of  change. As such, her goal is to continue with weight loss efforts She has agreed to keep a food journal with 450-600 calories and 40 grams of protein at supper daily and follow the Category 3 plan Michaela Holland has been instructed to work up to a goal of 150 minutes of combined cardio and strengthening exercise per week for weight loss and overall health benefits. We discussed the following Behavioral Modification Strategies today: increasing lean protein intake, increasing vegetables, work on meal planning and easy cooking plans, and planning for success   Michaela Holland has agreed to follow up with our clinic in 2 weeks. She was informed of the importance of frequent follow up visits to maximize her success with intensive lifestyle modifications for her multiple health conditions.   OBESITY BEHAVIORAL INTERVENTION VISIT  Today's visit was # 6 out of 22.  Starting weight: 313 lbs Starting date: 10/22/17 Today's weight : 303 lbs  Today's date: 01/22/2018 Total lbs lost to date: 10 (Patients must lose 7 lbs in the first 6 months to continue with counseling)   ASK: We discussed the diagnosis of obesity with Michaela Holland today and Michaela Holland agreed to give [place] permission to discuss obesity behavioral modification therapy today.  ASSESS: Michaela Holland has the diagnosis of obesity and her BMI today is 51.98 Michaela Holland is in the action stage of change   ADVISE: Michaela Holland was educated on the multiple health risks of obesity as well as the benefit of weight loss to improve her health. She was advised of the need for long term treatment and the importance of lifestyle modifications.  AGREE: Multiple dietary modification options and treatment options were discussed and  Michaela Holland agreed to the above obesity treatment plan.  I, Burt Knack, am acting as transcriptionist for Debbra Riding, MD  I have reviewed the above documentation for accuracy and completeness, and I agree with the above. - Debbra Riding, MD

## 2018-01-23 MED ORDER — VITAMIN D (ERGOCALCIFEROL) 1.25 MG (50000 UNIT) PO CAPS: 50000.0000 [IU] | ORAL_CAPSULE | ORAL | 0 refills | Status: DC

## 2018-01-23 MED FILL — VIT D2 1.25 MG (50,000 UNIT: 1.25 MG | 28 days supply | Qty: 4 | Fill #0

## 2018-01-30 MED FILL — LEVOTHYROXINE 175 MCG TAB: 175 | 90 days supply | Qty: 90 | Fill #0

## 2018-02-05 ENCOUNTER — Other Ambulatory Visit (INDEPENDENT_AMBULATORY_CARE_PROVIDER_SITE_OTHER): Payer: Self-pay

## 2018-02-05 ENCOUNTER — Encounter (INDEPENDENT_AMBULATORY_CARE_PROVIDER_SITE_OTHER): Payer: Self-pay | Admitting: Orthopaedic Surgery

## 2018-02-05 DIAGNOSIS — M25562 Pain in left knee: Principal | ICD-10-CM

## 2018-02-05 DIAGNOSIS — G8929 Other chronic pain: Secondary | ICD-10-CM

## 2018-02-13 ENCOUNTER — Encounter (HOSPITAL_COMMUNITY): Payer: Self-pay | Admitting: Psychiatry

## 2018-02-13 ENCOUNTER — Ambulatory Visit (INDEPENDENT_AMBULATORY_CARE_PROVIDER_SITE_OTHER): Payer: 59 | Admitting: Psychiatry

## 2018-02-13 ENCOUNTER — Ambulatory Visit (INDEPENDENT_AMBULATORY_CARE_PROVIDER_SITE_OTHER): Payer: 59 | Admitting: Physician Assistant

## 2018-02-13 VITALS — BP 128/82 | HR 79 | Ht 64.0 in | Wt 311.0 lb

## 2018-02-13 DIAGNOSIS — F411 Generalized anxiety disorder: Secondary | ICD-10-CM

## 2018-02-13 DIAGNOSIS — F419 Anxiety disorder, unspecified: Secondary | ICD-10-CM

## 2018-02-13 DIAGNOSIS — Z736 Limitation of activities due to disability: Secondary | ICD-10-CM

## 2018-02-13 DIAGNOSIS — F1099 Alcohol use, unspecified with unspecified alcohol-induced disorder: Secondary | ICD-10-CM | POA: Diagnosis not present

## 2018-02-13 DIAGNOSIS — Z818 Family history of other mental and behavioral disorders: Secondary | ICD-10-CM

## 2018-02-13 DIAGNOSIS — F331 Major depressive disorder, recurrent, moderate: Secondary | ICD-10-CM | POA: Diagnosis not present

## 2018-02-13 MED ORDER — CITALOPRAM HYDROBROMIDE 40 MG PO TABS: 40.0000 mg | ORAL_TABLET | Freq: Every day | ORAL | 0 refills | Status: DC

## 2018-02-13 MED FILL — OMEPRAZOLE 40 MG CPDR: 40 | 30 days supply | Qty: 30 | Fill #1

## 2018-02-13 MED FILL — METFORMIN HCL ER 500 MG TAB: 500 | 90 days supply | Qty: 90 | Fill #0

## 2018-02-13 MED FILL — CITALOPRAM HBR 40 MG TABLET: 40 | 90 days supply | Qty: 90 | Fill #0

## 2018-02-13 NOTE — Progress Notes (Signed)
BH MD/PA/NP OP Progress Note  02/13/2018 1:13 PM Michaela Holland  MRN:  [id number]  Chief Complaint: Med management HPI: Michaela Holland continues to function fairly poorly, unable to attend work.  She has not made it to her therapy visits due to feeling upset and offended that her therapist fell asleep a couple times.  I referred her to the mood treatment center which is closer to her house and [facility].  We agreed to increase Celexa to 40 mg for a more robust mood and anxiety control.  She has been using clonazepam very sparingly due to efforts to avoid benzodiazepine.  She may be interested in adopting a dog and I agreed to support her registering and emotional support animal with the stipulation that she needs to assure safe training of the animal, and agreed that would be appropriate and safe around her and her family.  Visit Diagnosis:    ICD-10-CM   1. Moderate episode of recurrent major depressive disorder (HCC) F33.1 citalopram (CELEXA) 40 MG tablet  2. Anxiety F41.9 citalopram (CELEXA) 40 MG tablet  3. Generalized anxiety disorder F41.1 citalopram (CELEXA) 40 MG tablet    Past Psychiatric History: See intake H&P for full details. Reviewed, with no updates at this time.   Past Medical History:  Past Medical History:  Diagnosis Date  . Anxiety   . Back pain   . Chronic pain of left knee   . Depression   . Diabetes mellitus without complication (HCC)    denies this diagnosis   . Family history of adverse reaction to anesthesia    per patient ; father was having abdominal surgery and had to be placed on medicine to keep his blood pressure up; he never had any issues with low BP before the surgery "   . Gallbladder problem   . GERD (gastroesophageal reflux disease)   . Gout    believes only occurred once in feet;   . Hypertension    denies this diagnosis   . Hypothyroidism   . Joint pain   . Obesity   . Osteoarthritis   . Palpitations   . PCOS (polycystic ovarian syndrome)    . PONV (postoperative nausea and vomiting)    with c section; no issues with followwing procedures   . Seasonal allergies   . Seizures (HCC)    has had "pre-seizure" activity in the brain but deneis any actual seizures   . Sleep apnea    does not currently use due to insurance   . Vitamin D deficiency     Past Surgical History:  Procedure Laterality Date  . CESAREAN SECTION    . CHOLECYSTECTOMY    . ESOPHAGOGASTRODUODENOSCOPY  2001 and 2018  . GANGLION CYST EXCISION Left 2014   foot  . KNEE ARTHROSCOPY Left 06/28/2017   Procedure: LEFT KNEE ARTHROSCOPY with debridement;  Surgeon: Kathryne Hitch, MD;  Location: [facility];  Service: Orthopedics;  Laterality: Left;    Family Psychiatric History: See intake H&P for full details. Reviewed, with no updates at this time.   Family History:  Family History  Problem Relation Age of Onset  . Polycystic ovary syndrome Mother   . Hypothyroidism Mother   . Hyperlipidemia Mother   . Depression Mother   . Obesity Mother   . Polycystic ovary syndrome Sister   . Hypothyroidism Sister   . Hypertension Father   . Hyperlipidemia Father   . Cancer Father   . Depression Father   . Anxiety disorder  Father   . Obesity Father     Social History:  Social History   Socioeconomic History  . Marital status: Married    Spouse name: Ree KidaJack Parham  . Number of children: 1  . Years of education: Not on file  . Highest education level: Not on file  Occupational History  . Occupation: Designer, jewelleryegistered Nurse  Social Needs  . Financial resource strain: Not on file  . Food insecurity:    Worry: Not on file    Inability: Not on file  . Transportation needs:    Medical: Not on file    Non-medical: Not on file  Tobacco Use  . Smoking status: Never Smoker  . Smokeless tobacco: Never Used  Substance and Sexual Activity  . Alcohol use: Yes    Comment: occasioanlly   . Drug use: No  . Sexual activity: Yes    Partners: Male    Birth  control/protection: None  Lifestyle  . Physical activity:    Days per week: Not on file    Minutes per session: Not on file  . Stress: Not on file  Relationships  . Social connections:    Talks on phone: Not on file    Gets together: Not on file    Attends religious service: Not on file    Active member of club or organization: Not on file    Attends meetings of clubs or organizations: Not on file    Relationship status: Not on file  Other Topics Concern  . Not on file  Social History Narrative  . Not on file    Allergies:  Allergies  Allergen Reactions  . Sulfa Antibiotics Hives and Shortness Of Breath  . Food     Allergy to walnuts, pecans, [place] nuts, hazel nuts (she can eat other nuts).  . Kelp-B6-Lecithin-Vinegar   . Other     Nuts severe reaction difficulty breathing , throat swelling; mostly tree nuts but not all tree nuts     Metabolic Disorder Labs: Lab Results  Component Value Date   HGBA1C 4.9 10/22/2017   Lab Results  Component Value Date   PROLACTIN 5.2 07/11/2017   Lab Results  Component Value Date   CHOL 182 10/22/2017   TRIG 117 10/22/2017   HDL 54 10/22/2017   LDLCALC 105 (H) 10/22/2017   Lab Results  Component Value Date   TSH 2.950 10/22/2017   TSH 1.94 07/11/2017    Therapeutic Level Labs: No results found for: LITHIUM No results found for: VALPROATE No components found for:  CBMZ  Current Medications: Current Outpatient Medications  Medication Sig Dispense Refill  . albuterol (PROVENTIL HFA;VENTOLIN HFA) 108 (90 Base) MCG/ACT inhaler Inhale 1-2 puffs into the lungs every 4 (four) hours as needed for wheezing or shortness of breath (bronchospasm). 1 Inhaler 0  . Black Pepper-Turmeric (TURMERIC CURCUMIN) 01-999 MG CAPS Take 1 capsule by mouth daily.    . citalopram (CELEXA) 40 MG tablet Take 1 tablet (40 mg total) by mouth daily. 90 tablet 0  . clonazePAM (KLONOPIN) 0.5 MG tablet Take 0.5-1 tablets (0.25-0.5 mg total) by mouth 2  (two) times daily as needed for anxiety (#30 for 90 days.). 30 tablet 0  . Cyanocobalamin 2000 MCG TBCR Take 1 tablet (2,000 mcg total) by mouth daily. 30 tablet 0  . diclofenac (VOLTAREN) 75 MG EC tablet Take 1 tablet (75 mg total) by mouth 2 (two) times daily. 60 tablet 1  . gabapentin (NEURONTIN) 300 MG capsule One tab PO  qHS for a week, then BID for a week, then TID. May double weekly to a max of 3,600mg /day 180 capsule 3  . Glucosamine-Chondroit-Vit C-Mn (GLUCOSAMINE CHONDR 1500 COMPLX PO) Take 1 capsule by mouth daily.    Marland Kitchen ibuprofen (ADVIL,MOTRIN) 200 MG tablet Take 800 mg by mouth every 8 (eight) hours as needed (for pain.).    Marland Kitchen metFORMIN (GLUCOPHAGE-XR) 500 MG 24 hr tablet Take 1 tablet (500 mg total) by mouth at bedtime. (Patient taking differently: Take 1,000 mg by mouth at bedtime. ) 90 tablet 3  . Nystatin POWD Apply liberally to affected area 2 times per day 1 Bottle 11  . omeprazole (PRILOSEC) 40 MG capsule Take 1 capsule (40 mg total) by mouth daily. 30 capsule 6  . ranitidine (ZANTAC) 150 MG tablet Take 150 mg by mouth daily as needed for heartburn.     . Vitamin D, Ergocalciferol, (DRISDOL) 50000 units CAPS capsule Take 1 capsule (50,000 Units total) by mouth every 7 (seven) days. 4 capsule 0   No current facility-administered medications for this visit.      Musculoskeletal: Strength & Muscle Tone: within normal limits Gait & Station: normal Patient leans: N/A  Psychiatric Specialty Exam: ROS  Blood pressure 128/82, pulse 79, height 5\' 4"  (1.626 m), weight (!) 311 lb (141.1 kg), SpO2 98 %.Body mass index is 53.38 kg/m.  General Appearance: Casual and Fairly Groomed  Eye Contact:  Fair  Speech:  Clear and Coherent and Normal Rate  Volume:  Normal  Mood:  Dysphoric  Affect:  Tearful  Thought Process:  Goal Directed and Descriptions of Associations: Intact  Orientation:  Full (Time, Place, and Person)  Thought Content: Logical   Suicidal Thoughts:  No  Homicidal  Thoughts:  No  Memory:  Immediate;   Good  Judgement:  Good  Insight:  Good  Psychomotor Activity:  Normal  Concentration:  Attention Span: Fair  Recall:  [employer] of Knowledge: Fair  Language: Fair  Akathisia:  Negative  Handed:  Right  AIMS (if indicated): not done  Assets:  Communication Skills Desire for Improvement Financial Resources/Insurance Social Support Transportation  ADL's:  Intact  Cognition: WNL  Sleep:  Fair   Screenings: GAD-7     Office Visit from 01/09/2018 in [facility] [place]  Total GAD-7 Score  9    PHQ2-9     Office Visit from 01/09/2018 in [facility] [place] Office Visit from 10/22/2017 [facility] Office Visit from 05/08/2017 in [facility] [place] Office Visit from 04/09/2017 in [facility] [place]  PHQ-2 Total Score  2  6  2  2   PHQ-9 Total Score  8  19  -  -       Assessment and Plan:  Monserrat Vidaurri Mccrackin continues to struggle with depression and anxiety related to her difficulty with working, physical complaints.  She is recovering from surgery and is currently out on long-term disability.  She has some difficulty being able to navigate what the next steps for her may be in terms of career, and I do think her anxiety and depression are contributing to this difficulty with thinking towards her future goals.  She does not have any acute safety issues.  She has good support from her husband.  We agreed to increase Celexa to 40 mg daily and we will follow-up in 8 weeks.  Disclosed to patient that  this Clinical research associate is leaving this practice at the end of August 2019, and patients always has the right to choose their provider. Reassured patient that office will work to provide smooth transition of care whether they wish to remain at this office, or to continue with this provider, or seek alternative care options in community.  They expressed  understanding.   1. Moderate episode of recurrent major depressive disorder (HCC)   2. Anxiety   3. Generalized anxiety disorder     Status of current problems: gradually worsening  Labs Ordered: No orders of the defined types were placed in this encounter.   Labs Reviewed: NA  Collateral Obtained/Records Reviewed: NA  Plan:  Increase Celexa to 40 mg daily Okay to take clonazepam 1-2 times per week while Celexa is taking effect  Return to clinic in 8 weeks for a final visit with writer [facility] to mood treatment center for follow-up psychiatric care and individual therapy given that this is closer to her home  Burnard Leigh, MD 02/13/2018, 1:13 PM

## 2018-02-14 ENCOUNTER — Ambulatory Visit (INDEPENDENT_AMBULATORY_CARE_PROVIDER_SITE_OTHER): Payer: 59

## 2018-02-14 ENCOUNTER — Encounter: Payer: Self-pay | Admitting: Osteopathic Medicine

## 2018-02-14 ENCOUNTER — Ambulatory Visit (INDEPENDENT_AMBULATORY_CARE_PROVIDER_SITE_OTHER): Payer: 59 | Admitting: Osteopathic Medicine

## 2018-02-14 VITALS — BP 137/67 | HR 74 | Temp 98.2°F | Wt 310.0 lb

## 2018-02-14 DIAGNOSIS — M2550 Pain in unspecified joint: Secondary | ICD-10-CM

## 2018-02-14 DIAGNOSIS — R5383 Other fatigue: Secondary | ICD-10-CM | POA: Diagnosis not present

## 2018-02-14 DIAGNOSIS — L659 Nonscarring hair loss, unspecified: Secondary | ICD-10-CM | POA: Diagnosis not present

## 2018-02-14 DIAGNOSIS — M25661 Stiffness of right knee, not elsewhere classified: Secondary | ICD-10-CM

## 2018-02-14 DIAGNOSIS — Z6841 Body Mass Index (BMI) 40.0 and over, adult: Secondary | ICD-10-CM | POA: Diagnosis not present

## 2018-02-14 DIAGNOSIS — M25561 Pain in right knee: Secondary | ICD-10-CM | POA: Diagnosis not present

## 2018-02-14 DIAGNOSIS — E282 Polycystic ovarian syndrome: Secondary | ICD-10-CM | POA: Diagnosis not present

## 2018-02-14 MED ORDER — LEVOTHYROXINE SODIUM 175 MCG PO TABS: 175.0000 ug | ORAL_TABLET | Freq: Every day | ORAL | 3 refills | Status: DC

## 2018-02-14 NOTE — Progress Notes (Signed)
HPI: Michaela Holland is a 37 y.o. female who  has a past medical history of Anxiety, Back pain, Chronic pain of left knee, Depression, Diabetes mellitus without complication (HCC), Family history of adverse reaction to anesthesia, Gallbladder problem, GERD (gastroesophageal reflux disease), Gout, Hypertension, Hypothyroidism, Joint pain, Obesity, Osteoarthritis, Palpitations, PCOS (polycystic ovarian syndrome), PONV (postoperative nausea and vomiting), Seasonal allergies, Seizures (HCC), Sleep apnea, and Vitamin D deficiency.  she presents to [facility] today, 02/14/18,  for chief complaint of:  Fatigue, knee pain, hair loss, hypothyroid    Fatigue worse than usual, she recently restarted thyroid medication but is not noticing much difference.  She reports hair loss from scalp a bit more pronounced than usual.  She has been struggling for a long time with her weight, we have a vicious cycle of obesity complicating knee pain but also knee pain making it very difficult for her to exercise.  She also has a history of PCOS.  Her husband will be undergoing bariatric surgery soon.  She has considered this option but would really like to hold off on surgery as this is definitely a life-changing procedure and she is not sure she is ready for it willing to accept the accompanying risks.  Reports chronic left knee pain which is largely unchanged but right knee pain has been bothering her a bit more lately.  She notes significant stiffness in the mornings in particular, otherwise just feels like there is an odd sort of pressure in the knee, no particular pain at the moment.   Past medical history, surgical history, and family history reviewed.  Current medication list and allergy/intolerance information reviewed.   (See remainder of HPI, ROS, Phys Exam below)  Dg Knee Complete 4 Views Right  Result Date: 02/14/2018 CLINICAL DATA:  Diffuse right knee pain and stiffness for 2  weeks. No known injury. EXAM: RIGHT KNEE - COMPLETE 4+ VIEW COMPARISON:  None. FINDINGS: No evidence of fracture, dislocation, or joint effusion. No evidence of arthropathy or other focal bone abnormality. Soft tissues are unremarkable. IMPRESSION: Normal exam. Electronically Signed   By: Drusilla Kanner M.D.   On: 02/14/2018 16:33      ASSESSMENT/PLAN:   Fatigue, unspecified type - Likely multifactorial.  Patient reports previous work-up for rheumatologic disorders negative but would like labs repeated at this time for reassurance - Plan: CBC, COMPLETE METABOLIC PANEL WITH GFR, TSH, Testosterone, CK, High sensitivity CRP, Sedimentation rate, ANA, Rheumatoid factor, ANA  Hair loss - Familial versus androgenic effect PCOS - Plan: TSH, Estradiol, Follicle stimulating hormone, Luteinizing hormone, Testosterone, ANA  PCOS (polycystic ovarian syndrome) - Plan: CBC, COMPLETE METABOLIC PANEL WITH GFR, TSH, Estradiol, Follicle stimulating hormone, Luteinizing hormone, Testosterone  Acute pain of right knee - Plan: DG Knee Complete 4 Views Right  Arthralgia, unspecified joint - Plan: DG Knee Complete 4 Views Right, CK, High sensitivity CRP, Sedimentation rate, ANA, Rheumatoid factor  Class 3 severe obesity with serious comorbidity and body mass index (BMI) of 50.0 to 59.9 in adult, unspecified obesity type [facility]) - Discussed that bariatric surgery is certainly an option, she is thinking about this a bit more lately.   Meds ordered this encounter  Medications  . levothyroxine (SYNTHROID, LEVOTHROID) 175 MCG tablet    Sig: Take 1 tablet (175 mcg total) by mouth daily before breakfast.    Dispense:  90 tablet    Refill:  3     Follow-up plan: Return for recheck 2 weeks if symptoms still  bothering you  .     ############################################ ############################################ ############################################ ############################################    Outpatient Encounter Medications as of 02/14/2018  Medication Sig Note  . albuterol (PROVENTIL HFA;VENTOLIN HFA) 108 (90 Base) MCG/ACT inhaler Inhale 1-2 puffs into the lungs every 4 (four) hours as needed for wheezing or shortness of breath (bronchospasm).   Vedia Coffer. Black Pepper-Turmeric (TURMERIC CURCUMIN) 01-999 MG CAPS Take 1 capsule by mouth daily.   . citalopram (CELEXA) 40 MG tablet Take 1 tablet (40 mg total) by mouth daily.   . clonazePAM (KLONOPIN) 0.5 MG tablet Take 0.5-1 tablets (0.25-0.5 mg total) by mouth 2 (two) times daily as needed for anxiety (#30 for 90 days.).   Marland Kitchen. Cyanocobalamin 2000 MCG TBCR Take 1 tablet (2,000 mcg total) by mouth daily.   . diclofenac (VOLTAREN) 75 MG EC tablet Take 1 tablet (75 mg total) by mouth 2 (two) times daily.   Marland Kitchen. gabapentin (NEURONTIN) 300 MG capsule One tab PO qHS for a week, then BID for a week, then TID. May double weekly to a max of 3,600mg /day   . Glucosamine-Chondroit-Vit C-Mn (GLUCOSAMINE CHONDR 1500 COMPLX PO) Take 1 capsule by mouth daily.   Marland Kitchen. ibuprofen (ADVIL,MOTRIN) 200 MG tablet Take 800 mg by mouth every 8 (eight) hours as needed (for pain.). 06/25/2017: On hold   . metFORMIN (GLUCOPHAGE-XR) 500 MG 24 hr tablet Take 1 tablet (500 mg total) by mouth at bedtime. (Patient taking differently: Take 1,000 mg by mouth at bedtime. )   . Nystatin POWD Apply liberally to affected area 2 times per day   . omeprazole (PRILOSEC) 40 MG capsule Take 1 capsule (40 mg total) by mouth daily.   . ranitidine (ZANTAC) 150 MG tablet Take 150 mg by mouth daily as needed for heartburn.    . Vitamin D, Ergocalciferol, (DRISDOL) 50000 units CAPS capsule Take 1 capsule (50,000 Units total) by mouth every 7 (seven) days.    No facility-administered encounter medications on  file as of 02/14/2018.    Allergies  Allergen Reactions  . Sulfa Antibiotics Hives and Shortness Of Breath  . Food     Allergy to walnuts, pecans, [place] nuts, hazel nuts (she can eat other nuts).  . Other     Nuts severe reaction difficulty breathing , throat swelling; mostly tree nuts but not all tree nuts       Review of Systems:  Constitutional: No recent illness  HEENT: No  headache, no vision change  Cardiac: No  chest pain, No  pressure  Respiratory:  No  shortness of breath. No  Cough  Gastrointestinal: No  abdominal pain, no change on bowel habits  Musculoskeletal: +new myalgia/arthralgia  Skin: No  Rash  Neurologic: No  weakness, No  Dizziness  Psychiatric: +concerns with depression, No  concerns with anxiety  Depression screen [facility] 2/9 01/09/2018 10/22/2017 05/08/2017  Decreased Interest 1 3 1   Down, Depressed, Hopeless 1 3 1   PHQ - 2 Score 2 6 2   Altered sleeping 0 2 -  Tired, decreased energy 3 3 -  Change in appetite 2 3 -  Feeling bad or failure about yourself  1 3 -  Trouble concentrating 0 1 -  Moving slowly or fidgety/restless 0 1 -  Suicidal thoughts 0 0 -  PHQ-9 Score 8 19 -  Difficult doing work/chores Somewhat difficult Very difficult -     Exam:  BP 137/67 (BP Location: Left Arm, Patient Position: Sitting, Cuff Size: Large)   Pulse 74   Temp 98.2  F (36.8 C) (Oral)   Wt (!) 310 lb 0.6 oz (140.6 kg)   BMI 53.22 kg/m   Constitutional: VS see above. General Appearance: alert, NAD  Eyes: Normal lids and conjunctive, non-icteric sclera  Ears, Nose, Mouth, Throat: MMM, Normal external inspection ears/nares/mouth/lips/gums.  Neck: No masses, trachea midline.   Respiratory: Normal respiratory effort.   Musculoskeletal: Gait favors the left knee a bit with antalgic. Symmetric and independent movement of all extremities.  On right knee, normal patellar glide, no crepitus, negative meniscal signs, ligamentous exam shows stable ACL, PCL, MCL,  LCL.  Neurological: Normal balance/coordination. No tremor.  Skin: warm, dry, intact.   Psychiatric: Normal judgment/insight. Normal mood and affect. Oriented x3.   Visit summary with medication list and pertinent instructions was printed for patient to review, advised to alert [place] if any changes needed. All questions at time of visit were answered - patient instructed to contact office with any additional concerns. ER/RTC precautions were reviewed with the patient and understanding verbalized.   Follow-up plan: Return for recheck 2 weeks if symptoms still bothering you .  Note: Total time spent 25 minutes, greater than 50% of the visit was spent face-to-face counseling and coordinating care for the following: The primary encounter diagnosis was Fatigue, unspecified type. Diagnoses of Hair loss, PCOS (polycystic ovarian syndrome), Acute pain of right knee, and Arthralgia, unspecified joint were also pertinent to this visit.Marland Kitchen  Please note: voice recognition software was used to produce this document, and typos may escape review. Please contact Dr. Lyn Hollingshead for any needed clarifications.

## 2018-02-15 ENCOUNTER — Encounter: Payer: Self-pay | Admitting: Physician Assistant

## 2018-02-17 ENCOUNTER — Ambulatory Visit (INDEPENDENT_AMBULATORY_CARE_PROVIDER_SITE_OTHER): Payer: 59

## 2018-02-17 DIAGNOSIS — M7122 Synovial cyst of popliteal space [Baker], left knee: Secondary | ICD-10-CM | POA: Diagnosis not present

## 2018-02-17 DIAGNOSIS — S83242A Other tear of medial meniscus, current injury, left knee, initial encounter: Secondary | ICD-10-CM

## 2018-02-17 DIAGNOSIS — M23322 Other meniscus derangements, posterior horn of medial meniscus, left knee: Secondary | ICD-10-CM | POA: Diagnosis not present

## 2018-02-17 DIAGNOSIS — M25462 Effusion, left knee: Secondary | ICD-10-CM | POA: Diagnosis not present

## 2018-02-17 DIAGNOSIS — G8929 Other chronic pain: Secondary | ICD-10-CM

## 2018-02-17 DIAGNOSIS — M25562 Pain in left knee: Principal | ICD-10-CM

## 2018-02-17 DIAGNOSIS — X58XXXA Exposure to other specified factors, initial encounter: Secondary | ICD-10-CM

## 2018-02-18 ENCOUNTER — Ambulatory Visit (HOSPITAL_COMMUNITY): Payer: Self-pay | Admitting: Licensed Clinical Social Worker

## 2018-02-18 LAB — ANA: ANA: NEGATIVE

## 2018-02-19 ENCOUNTER — Encounter (INDEPENDENT_AMBULATORY_CARE_PROVIDER_SITE_OTHER): Payer: Self-pay | Admitting: Orthopaedic Surgery

## 2018-02-19 ENCOUNTER — Ambulatory Visit (INDEPENDENT_AMBULATORY_CARE_PROVIDER_SITE_OTHER): Payer: 59 | Admitting: Physician Assistant

## 2018-02-19 VITALS — BP 126/77 | HR 74 | Temp 97.9°F | Ht 64.0 in | Wt 303.0 lb

## 2018-02-19 DIAGNOSIS — E559 Vitamin D deficiency, unspecified: Secondary | ICD-10-CM | POA: Diagnosis not present

## 2018-02-19 DIAGNOSIS — Z9189 Other specified personal risk factors, not elsewhere classified: Secondary | ICD-10-CM | POA: Diagnosis not present

## 2018-02-19 DIAGNOSIS — R5383 Other fatigue: Secondary | ICD-10-CM

## 2018-02-19 DIAGNOSIS — Z6841 Body Mass Index (BMI) 40.0 and over, adult: Secondary | ICD-10-CM | POA: Diagnosis not present

## 2018-02-19 MED ORDER — VITAMIN D (ERGOCALCIFEROL) 1.25 MG (50000 UNIT) PO CAPS: 50000.0000 [IU] | ORAL_CAPSULE | ORAL | 0 refills | Status: DC

## 2018-02-19 NOTE — Progress Notes (Signed)
Office: [phone]  /  Fax: [phone]   HPI:   Chief Complaint: OBESITY Michaela Holland is here to discuss her progress with her obesity treatment plan. She is on the  keep a food journal with 450 to 600 calories and 40 grams of protein at supper daily and follow the Category 3 plan and is following her eating plan approximately 30 % of the time. She states she is walking and swimming for 15 minutes 3 times per week. Milderd maintained her weight. She continues to have challenges with preplanning her meals and getting the recommended protein. Her weight is (!) 303 lb (137.4 kg) today and has maintained weight over a period of 4 weeks since her last visit. She has lost 10 lbs since starting treatment with us.  Vitamin D deficiency Michaela Holland has a diagnosis of vitamin D deficiency. She is currently taking vit D and denies nausea, vomiting or muscle weakness.  At risk for osteopenia and osteoporosis Michaela Holland is at higher risk of osteopenia and osteoporosis due to vitamin D deficiency.   Fatigue Michaela Holland admits to ongoing fatigue, non improving. She has associated daytime somnolence. Patient was referred for sleep study by our clinic, but has not followed up with them.  ALLERGIES: Allergies  Allergen Reactions  . Sulfa Antibiotics Hives and Shortness Of Breath  . Food     Allergy to walnuts, pecans, [place] nuts, hazel nuts (she can eat other nuts).  . Other     Nuts severe reaction difficulty breathing , throat swelling; mostly tree nuts but not all tree nuts     MEDICATIONS: Current Outpatient Medications on File Prior to Visit  Medication Sig Dispense Refill  . albuterol (PROVENTIL HFA;VENTOLIN HFA) 108 (90 Base) MCG/ACT inhaler Inhale 1-2 puffs into the lungs every 4 (four) hours as needed for wheezing or shortness of breath (bronchospasm). 1 Inhaler 0  . Black Pepper-Turmeric (TURMERIC CURCUMIN) 01-999 MG CAPS Take 1 capsule by mouth daily.    . citalopram (CELEXA) 40 MG tablet Take 1 tablet (40 mg  total) by mouth daily. 90 tablet 0  . clonazePAM (KLONOPIN) 0.5 MG tablet Take 0.5-1 tablets (0.25-0.5 mg total) by mouth 2 (two) times daily as needed for anxiety (#30 for 90 days.). 30 tablet 0  . Cyanocobalamin 2000 MCG TBCR Take 1 tablet (2,000 mcg total) by mouth daily. 30 tablet 0  . diclofenac (VOLTAREN) 75 MG EC tablet Take 1 tablet (75 mg total) by mouth 2 (two) times daily. 60 tablet 1  . gabapentin (NEURONTIN) 300 MG capsule One tab PO qHS for a week, then BID for a week, then TID. May double weekly to a max of 3,600mg /day 180 capsule 3  . Glucosamine-Chondroit-Vit C-Mn (GLUCOSAMINE CHONDR 1500 COMPLX PO) Take 1 capsule by mouth daily.    Marland Kitchen. ibuprofen (ADVIL,MOTRIN) 200 MG tablet Take 800 mg by mouth every 8 (eight) hours as needed (for pain.).    Marland Kitchen. levothyroxine (SYNTHROID, LEVOTHROID) 175 MCG tablet Take 1 tablet (175 mcg total) by mouth daily before breakfast. 90 tablet 3  . metFORMIN (GLUCOPHAGE-XR) 500 MG 24 hr tablet Take 1 tablet (500 mg total) by mouth at bedtime. (Patient taking differently: Take 1,000 mg by mouth at bedtime. ) 90 tablet 3  . Nystatin POWD Apply liberally to affected area 2 times per day 1 Bottle 11  . omeprazole (PRILOSEC) 40 MG capsule Take 1 capsule (40 mg total) by mouth daily. 30 capsule 6  . ranitidine (ZANTAC) 150 MG tablet Take 150 mg by mouth  daily as needed for heartburn.     . Vitamin D, Ergocalciferol, (DRISDOL) 50000 units CAPS capsule Take 1 capsule (50,000 Units total) by mouth every 7 (seven) days. 4 capsule 0   No current facility-administered medications on file prior to visit.     PAST MEDICAL HISTORY: Past Medical History:  Diagnosis Date  . Anxiety   . Back pain   . Chronic pain of left knee   . Depression   . Diabetes mellitus without complication (HCC)    denies this diagnosis   . Family history of adverse reaction to anesthesia    per patient ; father was having abdominal surgery and had to be placed on medicine to keep his blood  pressure up; he never had any issues with low BP before the surgery "   . Gallbladder problem   . GERD (gastroesophageal reflux disease)   . Gout    believes only occurred once in feet;   . Hypertension    denies this diagnosis   . Hypothyroidism   . Joint pain   . Obesity   . Osteoarthritis   . Palpitations   . PCOS (polycystic ovarian syndrome)   . PONV (postoperative nausea and vomiting)    with c section; no issues with followwing procedures   . Seasonal allergies   . Seizures (HCC)    has had "pre-seizure" activity in the brain but deneis any actual seizures   . Sleep apnea    does not currently use due to insurance   . Vitamin D deficiency     PAST SURGICAL HISTORY: Past Surgical History:  Procedure Laterality Date  . CESAREAN SECTION    . CHOLECYSTECTOMY    . ESOPHAGOGASTRODUODENOSCOPY  2001 and 2018  . GANGLION CYST EXCISION Left 2014   foot  . KNEE ARTHROSCOPY Left 06/28/2017   Procedure: LEFT KNEE ARTHROSCOPY with debridement;  Surgeon: Kathryne Hitch, MD;  Location: [facility];  Service: Orthopedics;  Laterality: Left;    SOCIAL HISTORY: Social History   Tobacco Use  . Smoking status: Never Smoker  . Smokeless tobacco: Never Used  Substance Use Topics  . Alcohol use: Yes    Comment: occasioanlly   . Drug use: No    FAMILY HISTORY: Family History  Problem Relation Age of Onset  . Polycystic ovary syndrome Mother   . Hypothyroidism Mother   . Hyperlipidemia Mother   . Depression Mother   . Obesity Mother   . Polycystic ovary syndrome Sister   . Hypothyroidism Sister   . Hypertension Father   . Hyperlipidemia Father   . Cancer Father   . Depression Father   . Anxiety disorder Father   . Obesity Father     ROS: Review of Systems  Constitutional: Positive for malaise/fatigue. Negative for weight loss.       Positive for daytime somnolence  Gastrointestinal: Negative for nausea and vomiting.  Musculoskeletal:       Negative for muscle  weakness    PHYSICAL EXAM: Blood pressure 126/77, pulse 74, temperature 97.9 F (36.6 C), temperature source Oral, height 5\' 4"  (1.626 m), weight (!) 303 lb (137.4 kg), SpO2 97 %. Body mass index is 52.01 kg/m. Physical Exam  Constitutional: She is oriented to person, place, and time. She appears well-developed and well-nourished.  Cardiovascular: Normal rate.  Pulmonary/Chest: Effort normal.  Musculoskeletal: Normal range of motion.  Neurological: She is oriented to person, place, and time.  Skin: Skin is warm and dry.  Psychiatric: She has  a normal mood and affect. Her behavior is normal.  Vitals reviewed.   RECENT LABS AND TESTS: BMET    Component Value Date/Time   NA 137 02/14/2018 1119   NA 138 10/22/2017 1236   K 4.4 02/14/2018 1119   CL 103 02/14/2018 1119   CO2 26 02/14/2018 1119   GLUCOSE 86 02/14/2018 1119   BUN 14 02/14/2018 1119   BUN 10 10/22/2017 1236   CREATININE 0.69 02/14/2018 1119   CALCIUM 9.3 02/14/2018 1119   GFRNONAA 112 02/14/2018 1119   GFRAA 130 02/14/2018 1119   Lab Results  Component Value Date   HGBA1C 4.9 10/22/2017   HGBA1C 5.1 07/11/2017   Lab Results  Component Value Date   INSULIN 8.8 10/22/2017   CBC    Component Value Date/Time   WBC 6.9 02/14/2018 1119   RBC 4.63 02/14/2018 1119   HGB 12.8 02/14/2018 1119   HGB 12.8 10/22/2017 1236   HCT 38.3 02/14/2018 1119   HCT 37.8 10/22/2017 1236   PLT 349 02/14/2018 1119   MCV 82.7 02/14/2018 1119   MCV 84 10/22/2017 1236   MCH 27.6 02/14/2018 1119   MCHC 33.4 02/14/2018 1119   RDW 12.9 02/14/2018 1119   RDW 13.5 10/22/2017 1236   LYMPHSABS 1.7 10/22/2017 1236   EOSABS 0.2 10/22/2017 1236   BASOSABS 0.0 10/22/2017 1236   Iron/TIBC/Ferritin/ %Sat No results found for: IRON, TIBC, FERRITIN, IRONPCTSAT Lipid Panel     Component Value Date/Time   CHOL 182 10/22/2017 1236   TRIG 117 10/22/2017 1236   HDL 54 10/22/2017 1236   LDLCALC 105 (H) 10/22/2017 1236   Hepatic  Function Panel     Component Value Date/Time   PROT 6.8 02/14/2018 1119   PROT 6.8 10/22/2017 1236   ALBUMIN 4.1 10/22/2017 1236   AST 12 02/14/2018 1119   ALT 12 02/14/2018 1119   ALKPHOS 61 10/22/2017 1236   BILITOT 0.6 02/14/2018 1119   BILITOT 0.5 10/22/2017 1236      Component Value Date/Time   TSH 0.96 02/14/2018 1119   TSH 2.950 10/22/2017 1236   TSH 1.94 07/11/2017 1204   Results for FREDDIE, NGHIEM (MRN [id number]) as of 02/19/2018 16:47  Ref. Range 10/22/2017 12:36  Vitamin D, 25-Hydroxy Latest Ref Range: 30.0 - 100.0 ng/mL 14.0 (L)   ASSESSMENT AND PLAN: Vitamin D deficiency - Plan: Vitamin D, Ergocalciferol, (DRISDOL) 50000 units CAPS capsule  Other fatigue  At risk for osteoporosis  Class 3 severe obesity with serious comorbidity and body mass index (BMI) of 50.0 to 59.9 in adult, unspecified obesity type (HCC)  PLAN:  Vitamin D Deficiency Altha was informed that low vitamin D levels contributes to fatigue and are associated with obesity, breast, and colon cancer. She agrees to continue to take prescription Vit D @50 ,000 IU every week #4 with no refills and will follow up for routine testing of vitamin D, at least 2-3 times per year. She was informed of the risk of over-replacement of vitamin D and agrees to not increase her dose unless she discusses this with [place] first. Kellsie agrees to follow up as directed.  At risk for osteopenia and osteoporosis Caiden is at risk for osteopenia and osteoporosis due to her vitamin D deficiency. She was encouraged to take her vitamin D and follow her higher calcium diet and increase strengthening exercise to help strengthen her bones and decrease her risk of osteopenia and osteoporosis.  Fatigue Evanee will follow up with neurology as previously scheduled  and in the meanwhile Nathifa will continue to work on diet, exercise and weight loss to help with fatigue. Proper sleep hygiene was discussed including the need for 7-8 hours of quality sleep  each night.  Obesity Melika is currently in the action stage of change. As such, her goal is to continue with weight loss efforts She has agreed to keep a food journal with 500 to 600 calories and 40 grams of protein at supper daily and follow the Category 3 plan Yulissa has been instructed to work up to a goal of 150 minutes of combined cardio and strengthening exercise per week for weight loss and overall health benefits. We discussed the following Behavioral Modification Strategies today: increasing lean protein intake and work on meal planning and easy cooking plans  Melany has agreed to follow up with our clinic in 2 weeks. She was informed of the importance of frequent follow up visits to maximize her success with intensive lifestyle modifications for her multiple health conditions.   OBESITY BEHAVIORAL INTERVENTION VISIT  Today's visit was # 7 out of 22.  Starting weight: 313 lbs Starting date: 10/22/17 Today's weight : 303 lbs  Today's date: 02/19/2018 Total lbs lost to date: 10 (Patients must lose 7 lbs in the first 6 months to continue with counseling)   ASK: We discussed the diagnosis of obesity with Garald Balding today and Yoneko agreed to give [place] permission to discuss obesity behavioral modification therapy today.  ASSESS: Bellatrix has the diagnosis of obesity and her BMI today is 51.98 Mardy is in the action stage of change   ADVISE: Jaelani was educated on the multiple health risks of obesity as well as the benefit of weight loss to improve her health. She was advised of the need for long term treatment and the importance of lifestyle modifications.  AGREE: Multiple dietary modification options and treatment options were discussed and  Millicent agreed to the above obesity treatment plan.   Cristi Loron, am acting as transcriptionist for [employer], PA-C I, [facility], have reviewed this note and agree with its content

## 2018-02-20 ENCOUNTER — Encounter: Payer: Self-pay | Admitting: Family Medicine

## 2018-02-24 ENCOUNTER — Ambulatory Visit (INDEPENDENT_AMBULATORY_CARE_PROVIDER_SITE_OTHER): Payer: 59 | Admitting: Family Medicine

## 2018-02-24 ENCOUNTER — Encounter (INDEPENDENT_AMBULATORY_CARE_PROVIDER_SITE_OTHER): Payer: Self-pay | Admitting: Orthopaedic Surgery

## 2018-02-24 ENCOUNTER — Ambulatory Visit (INDEPENDENT_AMBULATORY_CARE_PROVIDER_SITE_OTHER): Payer: 59 | Admitting: Orthopaedic Surgery

## 2018-02-24 DIAGNOSIS — G8929 Other chronic pain: Secondary | ICD-10-CM | POA: Diagnosis not present

## 2018-02-24 DIAGNOSIS — M1712 Unilateral primary osteoarthritis, left knee: Secondary | ICD-10-CM | POA: Diagnosis not present

## 2018-02-24 DIAGNOSIS — Z9889 Other specified postprocedural states: Secondary | ICD-10-CM | POA: Diagnosis not present

## 2018-02-24 DIAGNOSIS — M25562 Pain in left knee: Secondary | ICD-10-CM | POA: Diagnosis not present

## 2018-02-24 MED ORDER — LIDOCAINE HCL 1 % IJ SOLN
3.0000 mL | INTRAMUSCULAR | Status: AC | PRN
  Administered 2018-02-24: 3 mL

## 2018-02-24 MED ORDER — METHYLPREDNISOLONE ACETATE 40 MG/ML IJ SUSP
40.0000 mg | INTRAMUSCULAR | Status: AC | PRN
  Administered 2018-02-24: 40 mg via INTRA_ARTICULAR

## 2018-02-24 NOTE — Progress Notes (Signed)
Office Visit Note   Patient: Michaela Holland           Date of Birth: 08-20-1981           MRN: [id number] Visit Date: 02/24/2018              Requested by: Sunnie NielsenAlexander, Natalie, DO [address] [place], [place] [id number] PCP: Sunnie NielsenAlexander, Natalie, DO   Assessment & Plan: Visit Diagnoses:  1. Chronic pain of left knee   2. H/O arthroscopy of left knee   3. Unilateral primary osteoarthritis, left knee     Plan: We had a long and thorough discussion at today's office visit.  I do feel this point that she should only perform sedentary type work due to the significant worsening of the medial compartment arthritis of her left knee.  The job partially include no lifting greater than 10 pounds and should significantly limit her walking and standing and should mainly be a sitdown type of job.  I did talk about trying a steroid injection in her knee today and following this up in 4 weeks with hyaluronic acid.  She understands this fully and does wish to try this.  She is to work on quad strengthening exercises and weight loss because she understands that her weight is a significant factor in her knee arthritis worsening.  She is considering bariatric surgery and we discussed this as well.  She tolerated steroid injection well in her left knee today.  4 weeks from now we will place hyaluronic acid injection in her left knee.  I did give her a work note today explaining the fact that from my standpoint I cannot allow her to perform work duties at have her standing and walking all day long.  Follow-Up Instructions: Return in about 1 month (around 03/24/2018).   Orders:  Orders Placed This Encounter  Procedures  . Large Joint Inj: L knee   No orders of the defined types were placed in this encounter.     Procedures: Large Joint Inj: L knee on 02/24/2018 11:13 AM Indications: diagnostic evaluation and pain Details: 22 G 1.5 in needle, superolateral approach  Arthrogram:  No  Medications: 3 mL lidocaine 1 %; 40 mg methylPREDNISolone acetate 40 MG/ML Outcome: tolerated well, no immediate complications Procedure, treatment alternatives, risks and benefits explained, specific risks discussed. Consent was given by the patient. Immediately prior to procedure a time out was called to verify the correct patient, procedure, equipment, support staff and site/side marked as required. Patient was prepped and draped in the usual sterile fashion.       Clinical Data: No additional findings.   Subjective: Chief Complaint  Patient presents with  . Left Knee - Pain    S/p MRI  Michaela Holland comes in today to go the MRI of her left knee.  We performed arthroscopic intervention of that left knee in October of last year.  Was found moderate cartilage changes in the medial compartment of the knee and no evidence of any type of meniscal tear.  The lateral compartment appeared normal and there is just some subtle degenerative changes in the ACL.  In spite of adequate rest and time as well as anti-inflammatories combined with formal physical therapy she has not gotten much better with her knee and ask is been getting worse.  Dr. Denyse Amassorey was able to aspirate a Baker's cyst earlier this year and place a steroid injection in that area from her left knee.  She is  unfortunately not been able to lose much weight at all and we did discuss bariatric surgery today.  We did send her for repeat MRI of her left knee due to worsening pain and swelling.  We will go over that today.  She still having a lot of problems with putting weight on that knee and standing for long periods of time.  This is causing significant depression for her due to how this is affecting her interacting with her child.  HPI  Review of Systems She currently denies any other active medical problems.  Objective: Vital Signs: There were no vitals taken for this visit.  Physical Exam She is alert and oriented x3 and in no acute  distress Ortho Exam Examination of her left knee shows that her knee hyperextends.  She has a mild effusion.  She has medial joint line tenderness and some lateral tenderness as well but the range of motion is full.  The knee again does not significantly hyperextend. Specialty Comments:  No specialty comments available.  Imaging: No results found. The MRI is reviewed with her and I do feel that it shows worsening medial compartment arthritis of her left knee and seeing the edema in the medial femoral condyle and medial tibial plateau certainly correlates with her arthroscopic findings which now show that this is worsened.  It also correlates with her having a needle hyperextends.  The radiologist notes fluid changes/signal changes in the posterior horn of the medial meniscus that is new from her previous MRI.  PMFS History: Patient Active Problem List   Diagnosis Date Noted  . Unilateral primary osteoarthritis, left knee 02/24/2018  . H/O arthroscopy of left knee 02/24/2018  . Baker cyst, left 01/01/2018  . Other fatigue 10/22/2017  . Shortness of breath on exertion 10/22/2017  . Vitamin D deficiency 10/22/2017  . S/P arthroscopy of left knee 07/18/2017  . Acute pain of left knee 06/17/2017  . Meniscal cyst, left 06/17/2017  . Cyst of lateral meniscus of left knee 05/08/2017  . Anterior cruciate ligament sprain 05/08/2017  . Morbid obesity (HCC) 05/08/2017  . History of obstructive sleep apnea 04/09/2017  . Hypothyroidism 04/09/2017  . PCOS (polycystic ovarian syndrome) 04/09/2017  . Anxiety 04/09/2017  . Chronic pain of left knee 04/09/2017  . Gastroesophageal reflux disease with esophagitis 04/09/2017   Past Medical History:  Diagnosis Date  . Anxiety   . Back pain   . Chronic pain of left knee   . Depression   . Diabetes mellitus without complication (HCC)    denies this diagnosis   . Family history of adverse reaction to anesthesia    per patient ; father was having  abdominal surgery and had to be placed on medicine to keep his blood pressure up; he never had any issues with low BP before the surgery "   . Gallbladder problem   . GERD (gastroesophageal reflux disease)   . Gout    believes only occurred once in feet;   . Hypertension    denies this diagnosis   . Hypothyroidism   . Joint pain   . Obesity   . Osteoarthritis   . Palpitations   . PCOS (polycystic ovarian syndrome)   . PONV (postoperative nausea and vomiting)    with c section; no issues with followwing procedures   . Seasonal allergies   . Seizures (HCC)    has had "pre-seizure" activity in the brain but deneis any actual seizures   . Sleep apnea  does not currently use due to insurance   . Vitamin D deficiency     Family History  Problem Relation Age of Onset  . Polycystic ovary syndrome Mother   . Hypothyroidism Mother   . Hyperlipidemia Mother   . Depression Mother   . Obesity Mother   . Polycystic ovary syndrome Sister   . Hypothyroidism Sister   . Hypertension Father   . Hyperlipidemia Father   . Cancer Father   . Depression Father   . Anxiety disorder Father   . Obesity Father     Past Surgical History:  Procedure Laterality Date  . CESAREAN SECTION    . CHOLECYSTECTOMY    . ESOPHAGOGASTRODUODENOSCOPY  2001 and 2018  . GANGLION CYST EXCISION Left 2014   foot  . KNEE ARTHROSCOPY Left 06/28/2017   Procedure: LEFT KNEE ARTHROSCOPY with debridement;  Surgeon: Kathryne Hitch, MD;  Location: [facility];  Service: Orthopedics;  Laterality: Left;   Social History   Occupational History  . Occupation: Designer, jewellery  Tobacco Use  . Smoking status: Never Smoker  . Smokeless tobacco: Never Used  Substance and Sexual Activity  . Alcohol use: Yes    Comment: occasioanlly   . Drug use: No  . Sexual activity: Yes    Partners: Male    Birth control/protection: None

## 2018-02-25 ENCOUNTER — Telehealth (INDEPENDENT_AMBULATORY_CARE_PROVIDER_SITE_OTHER): Payer: Self-pay

## 2018-02-25 LAB — CK: Total CK: 43 U/L (ref 29–143)

## 2018-02-25 LAB — COMPLETE METABOLIC PANEL WITH GFR
AG RATIO: 1.6 (calc) (ref 1.0–2.5)
ALT: 12 U/L (ref 6–29)
AST: 12 U/L (ref 10–30)
Albumin: 4.2 g/dL (ref 3.6–5.1)
Alkaline phosphatase (APISO): 51 U/L (ref 33–115)
BUN: 14 mg/dL (ref 7–25)
CALCIUM: 9.3 mg/dL (ref 8.6–10.2)
CO2: 26 mmol/L (ref 20–32)
Chloride: 103 mmol/L (ref 98–110)
Creat: 0.69 mg/dL (ref 0.50–1.10)
GFR, EST AFRICAN AMERICAN: 130 mL/min/{1.73_m2} (ref >=60)
GFR, Est Non African American: 112 mL/min/{1.73_m2} (ref >=60)
Globulin: 2.6 g/dL (calc) (ref 1.9–3.7)
Glucose, Bld: 86 mg/dL (ref 65–99)
POTASSIUM: 4.4 mmol/L (ref 3.5–5.3)
Sodium: 137 mmol/L (ref 135–146)
TOTAL PROTEIN: 6.8 g/dL (ref 6.1–8.1)
Total Bilirubin: 0.6 mg/dL (ref 0.2–1.2)

## 2018-02-25 LAB — CBC
HCT: 38.3 % (ref 35.0–45.0)
Hemoglobin: 12.8 g/dL (ref 11.7–15.5)
MCH: 27.6 pg (ref 27.0–33.0)
MCHC: 33.4 g/dL (ref 32.0–36.0)
MCV: 82.7 fL (ref 80.0–100.0)
MPV: 9.8 fL (ref 7.5–12.5)
PLATELETS: 349 10*3/uL (ref 140–400)
RBC: 4.63 10*6/uL (ref 3.80–5.10)
RDW: 12.9 % (ref 11.0–15.0)
WBC: 6.9 10*3/uL (ref 3.8–10.8)

## 2018-02-25 LAB — TSH: TSH: 0.96 mIU/L

## 2018-02-25 LAB — TESTOSTERONE, TOTAL, LC/MS/MS: Testosterone, Total, LC-MS-MS: 24 ng/dL (ref 2–45)

## 2018-02-25 LAB — ESTRADIOL: Estradiol: 41 pg/mL

## 2018-02-25 LAB — HIGH SENSITIVITY CRP: hs-CRP: >10 mg/L — ABNORMAL HIGH

## 2018-02-25 LAB — RHEUMATOID FACTOR: Rhuematoid fact SerPl-aCnc: <14 IU/mL (ref <14)

## 2018-02-25 LAB — SEDIMENTATION RATE: Sed Rate: 19 mm/h (ref 0–20)

## 2018-02-25 LAB — FOLLICLE STIMULATING HORMONE: FSH: 6.6 m[IU]/mL

## 2018-02-25 LAB — LUTEINIZING HORMONE: LH: 2.9 m[IU]/mL

## 2018-02-25 NOTE — Telephone Encounter (Signed)
Submitted application online for Monovisc, left knee. 

## 2018-02-26 ENCOUNTER — Other Ambulatory Visit (INDEPENDENT_AMBULATORY_CARE_PROVIDER_SITE_OTHER): Payer: Self-pay | Admitting: Family Medicine

## 2018-02-26 DIAGNOSIS — E559 Vitamin D deficiency, unspecified: Secondary | ICD-10-CM

## 2018-02-27 DIAGNOSIS — H52223 Regular astigmatism, bilateral: Secondary | ICD-10-CM | POA: Diagnosis not present

## 2018-03-05 ENCOUNTER — Ambulatory Visit (INDEPENDENT_AMBULATORY_CARE_PROVIDER_SITE_OTHER): Payer: 59 | Admitting: Physician Assistant

## 2018-03-05 ENCOUNTER — Encounter (INDEPENDENT_AMBULATORY_CARE_PROVIDER_SITE_OTHER): Payer: Self-pay | Admitting: Radiology

## 2018-03-05 VITALS — BP 91/69 | HR 74 | Temp 97.6°F | Ht 64.0 in | Wt 303.0 lb

## 2018-03-05 DIAGNOSIS — E538 Deficiency of other specified B group vitamins: Secondary | ICD-10-CM

## 2018-03-05 DIAGNOSIS — Z6841 Body Mass Index (BMI) 40.0 and over, adult: Secondary | ICD-10-CM | POA: Diagnosis not present

## 2018-03-05 DIAGNOSIS — E559 Vitamin D deficiency, unspecified: Secondary | ICD-10-CM

## 2018-03-05 DIAGNOSIS — Z9189 Other specified personal risk factors, not elsewhere classified: Secondary | ICD-10-CM | POA: Diagnosis not present

## 2018-03-05 DIAGNOSIS — E7849 Other hyperlipidemia: Secondary | ICD-10-CM | POA: Diagnosis not present

## 2018-03-05 MED ORDER — VITAMIN D (ERGOCALCIFEROL) 1.25 MG (50000 UNIT) PO CAPS: 50000.0000 [IU] | ORAL_CAPSULE | ORAL | 0 refills | Status: DC

## 2018-03-05 NOTE — Progress Notes (Signed)
I called patient and advised that the insurance will cover Monovisc injection for her at 100% at a $60 copay.  Worked her in to tomorrow's schedule with CB due to her insurance ending on 03/09/18.  No PA needed, buy and bill allowed.

## 2018-03-05 NOTE — Progress Notes (Signed)
Office: [phone]  /  Fax: [phone]   HPI:   Chief Complaint: OBESITY Michaela Holland is here to discuss her progress with her obesity treatment plan. She is on the keep a food journal with 500-600 calories and 40 grams of protein at supper daily and follow the Category 3 plan and is following her eating plan approximately 65 % of the time. She states she is swimming and walking for 15 minutes 2 times per week. Michaela Holland maintained her weight. She has been busy taking care of her sick husband and ate out more. She is motivated to get back on track and continue with weight loss.  Her weight is (!) 303 lb (137.4 kg) today and has not lost weight since her last visit. She has lost 10 lbs since starting treatment with [place].  Hyperlipidemia Michaela Holland has hyperlipidemia and has been trying to improve her cholesterol levels with intensive lifestyle modification including a low saturated fat diet, exercise and weight loss. She denies any chest pain, claudication or myalgias. She is not on medications, declines.  At risk for cardiovascular disease Michaela Holland is at a higher than average risk for cardiovascular disease due to obesity and hyperlipidemia. She currently denies any chest pain.  Vitamin D Deficiency Michaela Holland has a diagnosis of vitamin D deficiency. She is currently taking prescription Vit D and denies nausea, vomiting or muscle weakness.  Vitamin B12 Deficiency Michaela Holland has a diagnosis of B12 insufficiency. She is on prescription B12, and admits to ongoing fatigue.She is not a vegetarian and does not have a previous diagnosis of pernicious anemia. She does not have a history of weight loss surgery.   ALLERGIES: Allergies  Allergen Reactions  . Sulfa Antibiotics Hives and Shortness Of Breath  . Food     Allergy to walnuts, pecans, [place] nuts, hazel nuts (she can eat other nuts).  . Other     Nuts severe reaction difficulty breathing , throat swelling; mostly tree nuts but not all tree nuts      MEDICATIONS: Current Outpatient Medications on File Prior to Visit  Medication Sig Dispense Refill  . albuterol (PROVENTIL HFA;VENTOLIN HFA) 108 (90 Base) MCG/ACT inhaler Inhale 1-2 puffs into the lungs every 4 (four) hours as needed for wheezing or shortness of breath (bronchospasm). 1 Inhaler 0  . Black Pepper-Turmeric (TURMERIC CURCUMIN) 01-999 MG CAPS Take 1 capsule by mouth daily.    . citalopram (CELEXA) 40 MG tablet Take 1 tablet (40 mg total) by mouth daily. 90 tablet 0  . clonazePAM (KLONOPIN) 0.5 MG tablet Take 0.5-1 tablets (0.25-0.5 mg total) by mouth 2 (two) times daily as needed for anxiety (#30 for 90 days.). 30 tablet 0  . Cyanocobalamin 2000 MCG TBCR Take 1 tablet (2,000 mcg total) by mouth daily. 30 tablet 0  . cyclobenzaprine (FLEXERIL) 10 MG tablet Take by mouth.    . diclofenac (VOLTAREN) 75 MG EC tablet Take 1 tablet (75 mg total) by mouth 2 (two) times daily. 60 tablet 1  . EPINEPHrine 0.3 mg/0.3 mL IJ SOAJ injection Inject into the muscle.    . fluconazole (DIFLUCAN) 150 MG tablet     . gabapentin (NEURONTIN) 300 MG capsule One tab PO qHS for a week, then BID for a week, then TID. May double weekly to a max of 3,600mg /day 180 capsule 3  . Glucosamine-Chondroit-Vit C-Mn (GLUCOSAMINE CHONDR 1500 COMPLX PO) Take 1 capsule by mouth daily.    Marland Kitchen guaiFENesin-codeine 100-10 MG/5ML syrup Take by mouth.    Marland Kitchen ibuprofen (ADVIL,MOTRIN) 200  MG tablet Take 800 mg by mouth every 8 (eight) hours as needed (for pain.).    Marland Kitchen levothyroxine (SYNTHROID, LEVOTHROID) 175 MCG tablet Take 1 tablet (175 mcg total) by mouth daily before breakfast. 90 tablet 3  . metFORMIN (GLUCOPHAGE-XR) 500 MG 24 hr tablet Take 1 tablet (500 mg total) by mouth at bedtime. (Patient taking differently: Take 1,000 mg by mouth at bedtime. ) 90 tablet 3  . Nystatin POWD Apply liberally to affected area 2 times per day 1 Bottle 11  . NYSTATIN powder     . omeprazole (PRILOSEC) 40 MG capsule Take 1 capsule (40 mg  total) by mouth daily. 30 capsule 6  . Prenatal MV-Min-FA-Omega-3 (PRENATAL GUMMIES/DHA & FA) 0.4-32.5 MG CHEW Chew by mouth.    . ranitidine (ZANTAC) 150 MG tablet Take 150 mg by mouth daily as needed for heartburn.     . thyroid (ARMOUR THYROID) 15 MG tablet Take by mouth.    . thyroid (ARMOUR THYROID) 90 MG tablet Take by mouth.    . Vitamin D, Ergocalciferol, (DRISDOL) 50000 units CAPS capsule Take 1 capsule (50,000 Units total) by mouth every 7 (seven) days. 4 capsule 0   No current facility-administered medications on file prior to visit.     PAST MEDICAL HISTORY: Past Medical History:  Diagnosis Date  . Anxiety   . Back pain   . Chronic pain of left knee   . Depression   . Diabetes mellitus without complication (HCC)    denies this diagnosis   . Family history of adverse reaction to anesthesia    per patient ; father was having abdominal surgery and had to be placed on medicine to keep his blood pressure up; he never had any issues with low BP before the surgery "   . Gallbladder problem   . GERD (gastroesophageal reflux disease)   . Gout    believes only occurred once in feet;   . Hypertension    denies this diagnosis   . Hypothyroidism   . Joint pain   . Obesity   . Osteoarthritis   . Palpitations   . PCOS (polycystic ovarian syndrome)   . PONV (postoperative nausea and vomiting)    with c section; no issues with followwing procedures   . Seasonal allergies   . Seizures (HCC)    has had "pre-seizure" activity in the brain but deneis any actual seizures   . Sleep apnea    does not currently use due to insurance   . Vitamin D deficiency     PAST SURGICAL HISTORY: Past Surgical History:  Procedure Laterality Date  . CESAREAN SECTION    . CHOLECYSTECTOMY    . ESOPHAGOGASTRODUODENOSCOPY  2001 and 2018  . GANGLION CYST EXCISION Left 2014   foot  . KNEE ARTHROSCOPY Left 06/28/2017   Procedure: LEFT KNEE ARTHROSCOPY with debridement;  Surgeon: Kathryne Hitch, MD;  Location: [facility];  Service: Orthopedics;  Laterality: Left;    SOCIAL HISTORY: Social History   Tobacco Use  . Smoking status: Never Smoker  . Smokeless tobacco: Never Used  Substance Use Topics  . Alcohol use: Yes    Comment: occasioanlly   . Drug use: No    FAMILY HISTORY: Family History  Problem Relation Age of Onset  . Polycystic ovary syndrome Mother   . Hypothyroidism Mother   . Hyperlipidemia Mother   . Depression Mother   . Obesity Mother   . Polycystic ovary syndrome Sister   . Hypothyroidism  Sister   . Hypertension Father   . Hyperlipidemia Father   . Cancer Father   . Depression Father   . Anxiety disorder Father   . Obesity Father     ROS: Review of Systems  Constitutional: Positive for malaise/fatigue. Negative for weight loss.  Cardiovascular: Negative for chest pain and claudication.  Gastrointestinal: Negative for nausea and vomiting.  Musculoskeletal: Negative for myalgias.       Negative muscle weakness    PHYSICAL EXAM: Blood pressure 91/69, pulse 74, temperature 97.6 F (36.4 C), temperature source Oral, height 5\' 4"  (1.626 m), weight (!) 303 lb (137.4 kg), SpO2 97 %. Body mass index is 52.01 kg/m. Physical Exam  Constitutional: She is oriented to person, place, and time. She appears well-developed and well-nourished.  Cardiovascular: Normal rate.  Pulmonary/Chest: Effort normal.  Musculoskeletal: Normal range of motion.  Neurological: She is oriented to person, place, and time.  Skin: Skin is warm and dry.  Psychiatric: She has a normal mood and affect. Her behavior is normal.  Vitals reviewed.   RECENT LABS AND TESTS: BMET    Component Value Date/Time   NA 137 02/14/2018 1119   NA 138 10/22/2017 1236   K 4.4 02/14/2018 1119   CL 103 02/14/2018 1119   CO2 26 02/14/2018 1119   GLUCOSE 86 02/14/2018 1119   BUN 14 02/14/2018 1119   BUN 10 10/22/2017 1236   CREATININE 0.69 02/14/2018 1119   CALCIUM 9.3  02/14/2018 1119   GFRNONAA 112 02/14/2018 1119   GFRAA 130 02/14/2018 1119   Lab Results  Component Value Date   HGBA1C 4.9 10/22/2017   HGBA1C 5.1 07/11/2017   Lab Results  Component Value Date   INSULIN 8.8 10/22/2017   CBC    Component Value Date/Time   WBC 6.9 02/14/2018 1119   RBC 4.63 02/14/2018 1119   HGB 12.8 02/14/2018 1119   HGB 12.8 10/22/2017 1236   HCT 38.3 02/14/2018 1119   HCT 37.8 10/22/2017 1236   PLT 349 02/14/2018 1119   MCV 82.7 02/14/2018 1119   MCV 84 10/22/2017 1236   MCH 27.6 02/14/2018 1119   MCHC 33.4 02/14/2018 1119   RDW 12.9 02/14/2018 1119   RDW 13.5 10/22/2017 1236   LYMPHSABS 1.7 10/22/2017 1236   EOSABS 0.2 10/22/2017 1236   BASOSABS 0.0 10/22/2017 1236   Iron/TIBC/Ferritin/ %Sat No results found for: IRON, TIBC, FERRITIN, IRONPCTSAT Lipid Panel     Component Value Date/Time   CHOL 182 10/22/2017 1236   TRIG 117 10/22/2017 1236   HDL 54 10/22/2017 1236   LDLCALC 105 (H) 10/22/2017 1236   Hepatic Function Panel     Component Value Date/Time   PROT 6.8 02/14/2018 1119   PROT 6.8 10/22/2017 1236   ALBUMIN 4.1 10/22/2017 1236   AST 12 02/14/2018 1119   ALT 12 02/14/2018 1119   ALKPHOS 61 10/22/2017 1236   BILITOT 0.6 02/14/2018 1119   BILITOT 0.5 10/22/2017 1236      Component Value Date/Time   TSH 0.96 02/14/2018 1119   TSH 2.950 10/22/2017 1236   TSH 1.94 07/11/2017 1204  Results for EMMALIE, HAIGH (MRN [id number]) as of 03/05/2018 11:11  Ref. Range 10/22/2017 12:36  Vitamin D, 25-Hydroxy Latest Ref Range: 30.0 - 100.0 ng/mL 14.0 (L)   Results for HALIMA, FOGAL (MRN [id number]) as of 03/05/2018 11:11  Ref. Range 10/22/2017 12:36  Vitamin B12 Latest Ref Range: 232 - 1,245 pg/mL 214 (L)   ASSESSMENT AND PLAN: Other  hyperlipidemia - Plan: Lipid Panel With LDL/HDL Ratio  Vitamin D deficiency - Plan: VITAMIN D 25 Hydroxy (Vit-D Deficiency, Fractures), Vitamin D, Ergocalciferol, (DRISDOL) 50000 units CAPS capsule  B12  nutritional deficiency - Plan: Vitamin B12  At risk for heart disease  Class 3 severe obesity with serious comorbidity and body mass index (BMI) of 50.0 to 59.9 in adult, unspecified obesity type (HCC)  PLAN:  Hyperlipidemia Michaela Holland was informed of the American Heart Association Guidelines emphasizing intensive lifestyle modifications as the first line treatment for hyperlipidemia. We discussed many lifestyle modifications today in depth, and Michaela Holland will continue to work on decreasing saturated fats such as fatty red meat, butter and many fried foods. She will also increase vegetables and lean protein in her diet and continue to work on diet, exercise, and weight loss efforts. We will check labs and Michaela Holland agrees to follow up with our clinic in 2 weeks.  Cardiovascular risk counselling Michaela Holland was given extended (15 minutes) coronary artery disease prevention counseling today. She is 37 y.o. female and has risk factors for heart disease including obesity and hyperlipidemia. We discussed intensive lifestyle modifications today with an emphasis on specific weight loss instructions and strategies. Pt was also informed of the importance of increasing exercise and decreasing saturated fats to help prevent heart disease.  Vitamin D Deficiency Michaela Holland was informed that low vitamin D levels contributes to fatigue and are associated with obesity, breast, and colon cancer. Michaela Holland agrees to continue taking prescription Vit D @50 ,000 IU every week #4 and we will refill for 1 month. She will follow up for routine testing of vitamin D, at least 2-3 times per year. She was informed of the risk of over-replacement of vitamin D and agrees to not increase her dose unless she discusses this with us first. We will check labs and Michaela Holland agrees to follow up with our clinic in 2 weeks.  Vitamin B12 Deficiency Michaela Holland will work on increasing B12 rich foods in her diet. B12 supplementation was not prescribed today. We will check Vit B12 and  Michaela Holland agrees to follow up with our clinic in 2 weeks.  Obesity Michaela Holland is currently in the action stage of change. As such, her goal is to continue with weight loss efforts She has agreed to keep a food journal with 500-600 calories and 40 grams of protein at supper daily and follow the Category 3 plan Michaela Holland has been instructed to work up to a goal of 150 minutes of combined cardio and strengthening exercise per week for weight loss and overall health benefits. We discussed the following Behavioral Modification Strategies today: increasing lean protein intake and work on meal planning and easy cooking plans   Michaela Holland has agreed to follow up with our clinic in 2 weeks. She was informed of the importance of frequent follow up visits to maximize her success with intensive lifestyle modifications for her multiple health conditions.   OBESITY BEHAVIORAL INTERVENTION VISIT  Today's visit was # 8 out of 22.  Starting weight: 313 lbs Starting date: 10/22/17 Today's weight : 303 lbs  Today's date: 03/05/2018 Total lbs lost to date: 10 (Patients must lose 7 lbs in the first 6 months to continue with counseling)   ASK: We discussed the diagnosis of obesity with Michaela BaldingAnne K Holland today and Michaela Holland agreed to give us permission to discuss obesity behavioral modification therapy today.  ASSESS: Michaela Holland has the diagnosis of obesity and her BMI today is 51.98 Michaela Holland is in the action stage of change  ADVISE: Michaela Holland was educated on the multiple health risks of obesity as well as the benefit of weight loss to improve her health. She was advised of the need for long term treatment and the importance of lifestyle modifications.  AGREE: Multiple dietary modification options and treatment options were discussed and  Michaela Holland agreed to the above obesity treatment plan.   Michaela Holland, am acting as transcriptionist for Illa Level, PA-C I, Illa Level [facility], have reviewed this note and agree with its content

## 2018-03-06 ENCOUNTER — Other Ambulatory Visit (INDEPENDENT_AMBULATORY_CARE_PROVIDER_SITE_OTHER): Payer: Self-pay | Admitting: Physician Assistant

## 2018-03-06 ENCOUNTER — Encounter (INDEPENDENT_AMBULATORY_CARE_PROVIDER_SITE_OTHER): Payer: Self-pay | Admitting: Orthopaedic Surgery

## 2018-03-06 ENCOUNTER — Ambulatory Visit (INDEPENDENT_AMBULATORY_CARE_PROVIDER_SITE_OTHER): Payer: 59 | Admitting: Orthopaedic Surgery

## 2018-03-06 DIAGNOSIS — M1712 Unilateral primary osteoarthritis, left knee: Secondary | ICD-10-CM | POA: Diagnosis not present

## 2018-03-06 LAB — LIPID PANEL WITH LDL/HDL RATIO
Cholesterol, Total: 179 mg/dL (ref 100–199)
HDL: 52 mg/dL (ref >39)
LDL CALC: 99 mg/dL (ref 0–99)
LDl/HDL Ratio: 1.9 ratio (ref 0.0–3.2)
TRIGLYCERIDES: 142 mg/dL (ref 0–149)
VLDL Cholesterol Cal: 28 mg/dL (ref 5–40)

## 2018-03-06 LAB — VITAMIN D 25 HYDROXY (VIT D DEFICIENCY, FRACTURES): VIT D 25 HYDROXY: 34.4 ng/mL (ref 30.0–100.0)

## 2018-03-06 LAB — VITAMIN B12: Vitamin B-12: 582 pg/mL (ref 232–1245)

## 2018-03-06 MED ORDER — HYALURONAN 88 MG/4ML IX SOSY
88.0000 mg | PREFILLED_SYRINGE | INTRA_ARTICULAR | Status: AC | PRN
  Administered 2018-03-06: 88 mg via INTRA_ARTICULAR

## 2018-03-06 MED FILL — GABAPENTIN 300 MG CAPSULE: 300 | 15 days supply | Qty: 180 | Fill #1

## 2018-03-06 MED FILL — DICLOFENAC SOD EC 75 MG TAB: 75 | 30 days supply | Qty: 60 | Fill #0

## 2018-03-06 NOTE — Progress Notes (Signed)
   Procedure Note  Patient: Garald Baldingnne K Mullens             Date of Birth: [birth year]           MRN: [id number]             Visit Date: 03/06/2018  Procedures: Visit Diagnoses: Unilateral primary osteoarthritis, left knee  Large Joint Inj: L knee on 03/06/2018 2:41 PM Indications: diagnostic evaluation and pain Details: 22 G 1.5 in needle, superolateral approach  Arthrogram: No  Medications: 88 mg Hyaluronan 88 MG/4ML Outcome: tolerated well, no immediate complications Procedure, treatment alternatives, risks and benefits explained, specific risks discussed. Consent was given by the patient. Immediately prior to procedure a time out was called to verify the correct patient, procedure, equipment, support staff and site/side marked as required. Patient was prepped and draped in the usual sterile fashion.    The patient is here today for a Monovisc injection of hyaluronic acid in her left knee to treat significant pain from moderate osteoarthritis in the medial compartment.  She has tried and failed other forms of conservative treatment measures and this is the next step for her.  She has a knee that does hyperextend.  There is no swelling of it today.  All questions concerns were answered and addressed.  She tolerated this injection well.  We will see her back in several weeks to see how she is doing overall.

## 2018-03-11 ENCOUNTER — Encounter (INDEPENDENT_AMBULATORY_CARE_PROVIDER_SITE_OTHER): Payer: Self-pay

## 2018-03-11 ENCOUNTER — Telehealth (INDEPENDENT_AMBULATORY_CARE_PROVIDER_SITE_OTHER): Payer: Self-pay | Admitting: Physician Assistant

## 2018-03-11 NOTE — Telephone Encounter (Signed)
Lab results not in my chart, [phone] pt number

## 2018-03-18 ENCOUNTER — Ambulatory Visit (HOSPITAL_COMMUNITY): Payer: Self-pay | Admitting: Licensed Clinical Social Worker

## 2018-03-25 ENCOUNTER — Ambulatory Visit (INDEPENDENT_AMBULATORY_CARE_PROVIDER_SITE_OTHER): Payer: 59 | Admitting: Physician Assistant

## 2018-03-25 ENCOUNTER — Ambulatory Visit (INDEPENDENT_AMBULATORY_CARE_PROVIDER_SITE_OTHER): Payer: Self-pay | Admitting: Physician Assistant

## 2018-03-25 ENCOUNTER — Encounter (HOSPITAL_COMMUNITY): Payer: Self-pay | Admitting: Psychiatry

## 2018-03-25 ENCOUNTER — Ambulatory Visit (INDEPENDENT_AMBULATORY_CARE_PROVIDER_SITE_OTHER): Payer: Self-pay | Admitting: Psychiatry

## 2018-03-25 ENCOUNTER — Encounter (INDEPENDENT_AMBULATORY_CARE_PROVIDER_SITE_OTHER): Payer: Self-pay

## 2018-03-25 VITALS — BP 133/73 | HR 95 | Ht 64.0 in | Wt 311.0 lb

## 2018-03-25 VITALS — BP 110/63 | HR 92 | Temp 98.1°F | Ht 64.0 in | Wt 307.0 lb

## 2018-03-25 DIAGNOSIS — Z9189 Other specified personal risk factors, not elsewhere classified: Secondary | ICD-10-CM

## 2018-03-25 DIAGNOSIS — F902 Attention-deficit hyperactivity disorder, combined type: Secondary | ICD-10-CM

## 2018-03-25 DIAGNOSIS — F411 Generalized anxiety disorder: Secondary | ICD-10-CM

## 2018-03-25 DIAGNOSIS — F3289 Other specified depressive episodes: Secondary | ICD-10-CM

## 2018-03-25 DIAGNOSIS — E559 Vitamin D deficiency, unspecified: Secondary | ICD-10-CM

## 2018-03-25 DIAGNOSIS — F331 Major depressive disorder, recurrent, moderate: Secondary | ICD-10-CM

## 2018-03-25 DIAGNOSIS — Z6841 Body Mass Index (BMI) 40.0 and over, adult: Secondary | ICD-10-CM

## 2018-03-25 DIAGNOSIS — F419 Anxiety disorder, unspecified: Secondary | ICD-10-CM

## 2018-03-25 DIAGNOSIS — F4312 Post-traumatic stress disorder, chronic: Secondary | ICD-10-CM

## 2018-03-25 MED ORDER — BREXPIPRAZOLE 1 MG PO TABS: 1.0000 mg | ORAL_TABLET | ORAL | 0 refills | Status: DC

## 2018-03-25 MED ORDER — VITAMIN D (ERGOCALCIFEROL) 1.25 MG (50000 UNIT) PO CAPS: 50000.0000 [IU] | ORAL_CAPSULE | ORAL | 0 refills | Status: DC

## 2018-03-25 MED ORDER — CLONAZEPAM 0.5 MG PO TABS: 0.2500 mg | ORAL_TABLET | Freq: Two times a day (BID) | ORAL | 0 refills | Status: DC | PRN

## 2018-03-25 MED FILL — clonazePAM 0.5 MG TABS: 0.5 | 90 days supply | Qty: 30 | Fill #0

## 2018-03-25 NOTE — Progress Notes (Signed)
BH MD/PA/NP OP Progress Note  03/25/2018 3:32 PM Michaela Holland  MRN:  [id number]  Chief Complaint: Med management  HPI: Michaela Holland has establish therapy near her home and [facility].  She continues to struggle with depression and anxiety, but does feel that the Celexa has provided some benefit.  She has used  clonazepam very sparingly.  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  She reports that her husband has been supportive, but she feels guilty.  She denies any suicidality or unsafe thoughts.  Given that she continues to feel quite stuck in terms of her depression symptoms, I suggested we consider augmentation with Abilify or Rexulti.  We agreed to initiate brexpiprazole and titrate gradually to effect.  Reviewed the risks and benefits of atypical antipsychotics with her, and suggested we also consider TMS.  I have reviewed the risks of TMS with her and some of the common side effects, and she wishes to think about it and learn more.  Visit Diagnosis:    ICD-10-CM   1. Moderate episode of recurrent major depressive disorder (HCC) F33.1 Brexpiprazole (REXULTI) 1 MG TABS  2. Attention deficit hyperactivity disorder (ADHD), combined type F90.2   3. Chronic post-traumatic stress disorder (PTSD) F43.12 Brexpiprazole (REXULTI) 1 MG TABS  4. GAD (generalized anxiety disorder) F41.1 Brexpiprazole (REXULTI) 1 MG TABS  5. Anxiety F41.9 clonazePAM (KLONOPIN) 0.5 MG tablet  6. Generalized anxiety disorder F41.1   7. Anxiety F41.9 clonazePAM (KLONOPIN) 0.5 MG tablet   Refill of clonazepam for now, defer further management to psychiatry    Past Psychiatric History: See intake H&P for full details. Reviewed, with no updates at this time.   Past Medical History:  Past Medical History:  Diagnosis Date  . Anxiety   . Back pain   . Chronic pain of left knee   .  Depression   . Diabetes mellitus without complication (HCC)    denies this diagnosis   . Family history of adverse reaction to anesthesia    per patient ; father was having abdominal surgery and had to be placed on medicine to keep his blood pressure up; he never had any issues with low BP before the surgery "   . Gallbladder problem   . GERD (gastroesophageal reflux disease)   . Gout    believes only occurred once in feet;   . Hypertension    denies this diagnosis   . Hypothyroidism   . Joint pain   . Obesity   . Osteoarthritis   . Palpitations   . PCOS (polycystic ovarian syndrome)   . PONV (postoperative nausea and vomiting)    with c section; no issues with followwing procedures   . Seasonal allergies   . Seizures (HCC)    has had "pre-seizure" activity in the brain but deneis any actual seizures   . Sleep apnea    does not currently use due to insurance   . Vitamin D deficiency     Past Surgical History:  Procedure Laterality Date  . CESAREAN SECTION    . CHOLECYSTECTOMY    . ESOPHAGOGASTRODUODENOSCOPY  2001 and 2018  . GANGLION CYST EXCISION Left 2014   foot  . KNEE ARTHROSCOPY Left 06/28/2017   Procedure: LEFT KNEE ARTHROSCOPY with debridement;  Surgeon: Kathryne Hitch, MD;  Location: [facility];  Service: Orthopedics;  Laterality:  Left;    Family Psychiatric History: See intake H&P for full details. Reviewed, with no updates at this time.   Family History:  Family History  Problem Relation Age of Onset  . Polycystic ovary syndrome Mother   . Hypothyroidism Mother   . Hyperlipidemia Mother   . Depression Mother   . Obesity Mother   . Polycystic ovary syndrome Sister   . Hypothyroidism Sister   . Hypertension Father   . Hyperlipidemia Father   . Cancer Father   . Depression Father   . Anxiety disorder Father   . Obesity Father     Social History:  Social History   Socioeconomic History  . Marital status: Married    Spouse name: Michaela Holland  .  Number of children: 1  . Years of education: Not on file  . Highest education level: Not on file  Occupational History  . Occupation: Designer, jewelleryegistered Nurse  Social Needs  . Financial resource strain: Not on file  . Food insecurity:    Worry: Not on file    Inability: Not on file  . Transportation needs:    Medical: Not on file    Non-medical: Not on file  Tobacco Use  . Smoking status: Never Smoker  . Smokeless tobacco: Never Used  Substance and Sexual Activity  . Alcohol use: Never    Frequency: Never  . Drug use: No  . Sexual activity: Yes    Partners: Male    Birth control/protection: None  Lifestyle  . Physical activity:    Days per week: Not on file    Minutes per session: Not on file  . Stress: Not on file  Relationships  . Social connections:    Talks on phone: Not on file    Gets together: Not on file    Attends religious service: Not on file    Active member of club or organization: Not on file    Attends meetings of clubs or organizations: Not on file    Relationship status: Not on file  Other Topics Concern  . Not on file  Social History Narrative  . Not on file    Allergies:  Allergies  Allergen Reactions  . Sulfa Antibiotics Hives and Shortness Of Breath  . Food     Allergy to walnuts, pecans, [place] nuts, hazel nuts (she can eat other nuts).  . Other     Nuts severe reaction difficulty breathing , throat swelling; mostly tree nuts but not all tree nuts     Metabolic Disorder Labs: Lab Results  Component Value Date   HGBA1C 4.9 10/22/2017   Lab Results  Component Value Date   PROLACTIN 5.2 07/11/2017   Lab Results  Component Value Date   CHOL 179 03/05/2018   TRIG 142 03/05/2018   HDL 52 03/05/2018   LDLCALC 99 03/05/2018   LDLCALC 105 (H) 10/22/2017   Lab Results  Component Value Date   TSH 0.96 02/14/2018   TSH 2.950 10/22/2017    Therapeutic Level Labs: No results found for: LITHIUM No results found for: VALPROATE No components  found for:  CBMZ  Current Medications: Current Outpatient Medications  Medication Sig Dispense Refill  . albuterol (PROVENTIL HFA;VENTOLIN HFA) 108 (90 Base) MCG/ACT inhaler Inhale 1-2 puffs into the lungs every 4 (four) hours as needed for wheezing or shortness of breath (bronchospasm). 1 Inhaler 0  . Black Pepper-Turmeric (TURMERIC CURCUMIN) 01-999 MG CAPS Take 1 capsule by mouth daily.    . citalopram (CELEXA)  40 MG tablet Take 1 tablet (40 mg total) by mouth daily. 90 tablet 0  . clonazePAM (KLONOPIN) 0.5 MG tablet Take 0.5-1 tablets (0.25-0.5 mg total) by mouth 2 (two) times daily as needed for anxiety (#30 for 90 days.). 30 tablet 0  . Cyanocobalamin 2000 MCG TBCR Take 1 tablet (2,000 mcg total) by mouth daily. 30 tablet 0  . cyclobenzaprine (FLEXERIL) 10 MG tablet Take by mouth.    . diclofenac (VOLTAREN) 75 MG EC tablet TAKE 1 TABLET BY MOUTH 2 TIMES DAILY. 60 tablet 1  . EPINEPHrine 0.3 mg/0.3 mL IJ SOAJ injection Inject into the muscle.    . fluconazole (DIFLUCAN) 150 MG tablet     . gabapentin (NEURONTIN) 300 MG capsule One tab PO qHS for a week, then BID for a week, then TID. May double weekly to a max of 3,600mg /day 180 capsule 3  . Glucosamine-Chondroit-Vit C-Mn (GLUCOSAMINE CHONDR 1500 COMPLX PO) Take 1 capsule by mouth daily.    Marland Kitchen ibuprofen (ADVIL,MOTRIN) 200 MG tablet Take 800 mg by mouth every 8 (eight) hours as needed (for pain.).    Marland Kitchen levothyroxine (SYNTHROID, LEVOTHROID) 175 MCG tablet Take 1 tablet (175 mcg total) by mouth daily before breakfast. 90 tablet 3  . metFORMIN (GLUCOPHAGE-XR) 500 MG 24 hr tablet Take 1 tablet (500 mg total) by mouth at bedtime. (Patient taking differently: Take 1,000 mg by mouth at bedtime. ) 90 tablet 3  . Nystatin POWD Apply liberally to affected area 2 times per day 1 Bottle 11  . omeprazole (PRILOSEC) 40 MG capsule Take 1 capsule (40 mg total) by mouth daily. 30 capsule 6  . Prenatal MV-Min-FA-Omega-3 (PRENATAL GUMMIES/DHA & FA) 0.4-32.5 MG  CHEW Chew by mouth.    . ranitidine (ZANTAC) 150 MG tablet Take 150 mg by mouth daily as needed for heartburn.     . Vitamin D, Ergocalciferol, (DRISDOL) 50000 units CAPS capsule Take 1 capsule (50,000 Units total) by mouth every 7 (seven) days. 4 capsule 0  . Brexpiprazole (REXULTI) 1 MG TABS Take 1 tablet (1 mg total) by mouth every morning. 90 tablet 0   No current facility-administered medications for this visit.      Musculoskeletal: Strength & Muscle Tone: within normal limits Gait & Station: normal Patient leans: N/A  Psychiatric Specialty Exam: ROS  Blood pressure 133/73, pulse 95, height 5\' 4"  (1.626 m), weight (!) 311 lb (141.1 kg), last menstrual period 03/13/2018.Body mass index is 53.38 kg/m.  General Appearance: Casual and Fairly Groomed  Eye Contact:  Fair  Speech:  Clear and Coherent and Normal Rate  Volume:  Normal  Mood:  Dysphoric  Affect:  Tearful  Thought Process:  Goal Directed and Descriptions of Associations: Intact  Orientation:  Full (Time, Place, and Person)  Thought Content: Logical   Suicidal Thoughts:  No  Homicidal Thoughts:  No  Memory:  Immediate;   Good  Judgement:  Good  Insight:  Good  Psychomotor Activity:  Normal  Concentration:  Attention Span: Fair  Recall:  [employer] of Knowledge: Fair  Language: Fair  Akathisia:  Negative  Handed:  Right  AIMS (if indicated): not done  Assets:  Communication Skills Desire for Improvement Financial Resources/Insurance Social Support Transportation  ADL's:  Intact  Cognition: WNL  Sleep:  Fair   Screenings: GAD-7     Office Visit from 01/09/2018 in [place] [facility] [address]  Total GAD-7 Score  9    PHQ2-9     Office  Visit from 01/09/2018 in [place] [facility] [place] Office Visit from 10/22/2017 in [facility] Office Visit from 05/08/2017 in [facility] [place] Office Visit from 04/09/2017 in [place]  [facility] [place]  PHQ-2 Total Score  2  6  2  2   PHQ-9 Total Score  8  19  -  -       Assessment and Plan:  Michaela Holland continues to struggle with depression with some improvements in her mood with increase in Celexa.  She has been tried on Celexa, Zoloft, Prozac, Wellbutrin, and has continued to have difficulty with treatment resistant depression.  I spent time educating her about the risks and benefits of TMS but she has apprehensions.  We agreed to proceed with augmentation by initiating Rexulti as below.  Viewed the risks of antipsychotics and we will follow-up in 8 weeks.  1. Moderate episode of recurrent major depressive disorder (HCC)   2. Attention deficit hyperactivity disorder (ADHD), combined type   3. Chronic post-traumatic stress disorder (PTSD)   4. GAD (generalized anxiety disorder)   5. Anxiety   6. Generalized anxiety disorder   7. Anxiety     Status of current problems: gradually worsening  Labs Ordered: No orders of the defined types were placed in this encounter.   Labs Reviewed: NA  Collateral Obtained/Records Reviewed: NA  Plan:  Celexa 40 mg daily Initiate Rexulti 0.5 mg, increase to 1 mg in 1 week as tolerated Continue to encourage her to consider TMS Okay to take clonazepam 1-2 times per week while Celexa is taking effect  Follow-up in 8 weeks  Burnard Leigh, MD 03/25/2018, 3:32 PM

## 2018-03-25 NOTE — Progress Notes (Signed)
Office: [phone]  /  Fax: [phone]   HPI:   Chief Complaint: OBESITY Michaela Holland is here to discuss her progress with her obesity treatment plan. She is on the keep a food journal with 500 to 600 calories and 40 grams of protein at supper daily and the Category 3 plan and is following her eating plan approximately 30 % of the time. She states she is swimming and walking 30 to 40 minutes 2 to 3 times per week. Michaela Holland is not drinking as much water. She has not been following her diet due to stress eating. Her weight is (!) 307 lb (139.3 kg) today and has had a weight loss of 4 pounds over a period of 3 weeks since her last visit. She has lost 6 lbs since starting treatment with [place].  Vitamin D deficiency Michaela Holland has a diagnosis of vitamin D deficiency. She is currently taking vit D and denies nausea, vomiting or muscle weakness.  At risk for osteopenia and osteoporosis Michaela Holland is at higher risk of osteopenia and osteoporosis due to vitamin D deficiency.   Depression with emotional eating behaviors Michaela Holland reports stress eating with husband's recent surgery. Michaela Holland struggles with emotional eating and using food for comfort to the extent that it is negatively impacting her health. She often snacks when she is not hungry. Michaela Holland sometimes feels she is out of control and then feels guilty that she made poor food choices. She has been working on behavior modification techniques to help reduce her emotional eating and has been somewhat successful. She shows no sign of suicidal or homicidal ideations.  Depression screen [facility] 2/9 01/09/2018 10/22/2017 05/08/2017 04/09/2017  Decreased Interest 1 3 1 1   Down, Depressed, Hopeless 1 3 1 1   PHQ - 2 Score 2 6 2 2   Altered sleeping 0 2 - -  Tired, decreased energy 3 3 - -  Change in appetite 2 3 - -  Feeling bad or failure about yourself  1 3 - -  Trouble concentrating 0 1 - -  Moving slowly or fidgety/restless 0 1 - -  Suicidal thoughts 0 0 - -  PHQ-9 Score 8 19 - -    Difficult doing work/chores Somewhat difficult Very difficult - -     ALLERGIES: Allergies  Allergen Reactions  . Sulfa Antibiotics Hives and Shortness Of Breath  . Food     Allergy to walnuts, pecans, [place] nuts, hazel nuts (she can eat other nuts).  . Other     Nuts severe reaction difficulty breathing , throat swelling; mostly tree nuts but not all tree nuts     MEDICATIONS: Current Outpatient Medications on File Prior to Visit  Medication Sig Dispense Refill  . albuterol (PROVENTIL HFA;VENTOLIN HFA) 108 (90 Base) MCG/ACT inhaler Inhale 1-2 puffs into the lungs every 4 (four) hours as needed for wheezing or shortness of breath (bronchospasm). 1 Inhaler 0  . Black Pepper-Turmeric (TURMERIC CURCUMIN) 01-999 MG CAPS Take 1 capsule by mouth daily.    . citalopram (CELEXA) 40 MG tablet Take 1 tablet (40 mg total) by mouth daily. 90 tablet 0  . Cyanocobalamin 2000 MCG TBCR Take 1 tablet (2,000 mcg total) by mouth daily. 30 tablet 0  . cyclobenzaprine (FLEXERIL) 10 MG tablet Take by mouth.    . diclofenac (VOLTAREN) 75 MG EC tablet TAKE 1 TABLET BY MOUTH 2 TIMES DAILY. 60 tablet 1  . EPINEPHrine 0.3 mg/0.3 mL IJ SOAJ injection Inject into the muscle.    . fluconazole (DIFLUCAN)  150 MG tablet     . gabapentin (NEURONTIN) 300 MG capsule One tab PO qHS for a week, then BID for a week, then TID. May double weekly to a max of 3,600mg /day 180 capsule 3  . Glucosamine-Chondroit-Vit C-Mn (GLUCOSAMINE CHONDR 1500 COMPLX PO) Take 1 capsule by mouth daily.    Marland Kitchen ibuprofen (ADVIL,MOTRIN) 200 MG tablet Take 800 mg by mouth every 8 (eight) hours as needed (for pain.).    Marland Kitchen levothyroxine (SYNTHROID, LEVOTHROID) 175 MCG tablet Take 1 tablet (175 mcg total) by mouth daily before breakfast. 90 tablet 3  . metFORMIN (GLUCOPHAGE-XR) 500 MG 24 hr tablet Take 1 tablet (500 mg total) by mouth at bedtime. (Patient taking differently: Take 1,000 mg by mouth at bedtime. ) 90 tablet 3  . Nystatin POWD Apply  liberally to affected area 2 times per day 1 Bottle 11  . omeprazole (PRILOSEC) 40 MG capsule Take 1 capsule (40 mg total) by mouth daily. 30 capsule 6  . Prenatal MV-Min-FA-Omega-3 (PRENATAL GUMMIES/DHA & FA) 0.4-32.5 MG CHEW Chew by mouth.    . ranitidine (ZANTAC) 150 MG tablet Take 150 mg by mouth daily as needed for heartburn.     . Vitamin D, Ergocalciferol, (DRISDOL) 50000 units CAPS capsule Take 1 capsule (50,000 Units total) by mouth every 7 (seven) days. 4 capsule 0  . Brexpiprazole (REXULTI) 1 MG TABS Take 1 tablet (1 mg total) by mouth every morning. 90 tablet 0  . clonazePAM (KLONOPIN) 0.5 MG tablet Take 0.5-1 tablets (0.25-0.5 mg total) by mouth 2 (two) times daily as needed for anxiety (#30 for 90 days.). 30 tablet 0   No current facility-administered medications on file prior to visit.     PAST MEDICAL HISTORY: Past Medical History:  Diagnosis Date  . Anxiety   . Back pain   . Chronic pain of left knee   . Depression   . Diabetes mellitus without complication (HCC)    denies this diagnosis   . Family history of adverse reaction to anesthesia    per patient ; father was having abdominal surgery and had to be placed on medicine to keep his blood pressure up; he never had any issues with low BP before the surgery "   . Gallbladder problem   . GERD (gastroesophageal reflux disease)   . Gout    believes only occurred once in feet;   . Hypertension    denies this diagnosis   . Hypothyroidism   . Joint pain   . Obesity   . Osteoarthritis   . Palpitations   . PCOS (polycystic ovarian syndrome)   . PONV (postoperative nausea and vomiting)    with c section; no issues with followwing procedures   . Seasonal allergies   . Seizures (HCC)    has had "pre-seizure" activity in the brain but deneis any actual seizures   . Sleep apnea    does not currently use due to insurance   . Vitamin D deficiency     PAST SURGICAL HISTORY: Past Surgical History:  Procedure Laterality  Date  . CESAREAN SECTION    . CHOLECYSTECTOMY    . ESOPHAGOGASTRODUODENOSCOPY  2001 and 2018  . GANGLION CYST EXCISION Left 2014   foot  . KNEE ARTHROSCOPY Left 06/28/2017   Procedure: LEFT KNEE ARTHROSCOPY with debridement;  Surgeon: Kathryne Hitch, MD;  Location: [facility];  Service: Orthopedics;  Laterality: Left;    SOCIAL HISTORY: Social History   Tobacco Use  . Smoking status: Never  Smoker  . Smokeless tobacco: Never Used  Substance Use Topics  . Alcohol use: Never    Frequency: Never  . Drug use: No    FAMILY HISTORY: Family History  Problem Relation Age of Onset  . Polycystic ovary syndrome Mother   . Hypothyroidism Mother   . Hyperlipidemia Mother   . Depression Mother   . Obesity Mother   . Polycystic ovary syndrome Sister   . Hypothyroidism Sister   . Hypertension Father   . Hyperlipidemia Father   . Cancer Father   . Depression Father   . Anxiety disorder Father   . Obesity Father     ROS: Review of Systems  Constitutional: Negative for weight loss.  Gastrointestinal: Negative for nausea and vomiting.  Musculoskeletal:       Negative for muscle weakness  Psychiatric/Behavioral: Positive for depression. Negative for suicidal ideas.       Positive for stress    PHYSICAL EXAM: Blood pressure 110/63, pulse 92, temperature 98.1 F (36.7 C), temperature source Oral, height 5\' 4"  (1.626 m), weight (!) 307 lb (139.3 kg), last menstrual period 03/13/2018, SpO2 96 %. Body mass index is 52.7 kg/m. Physical Exam  Constitutional: She is oriented to person, place, and time. She appears well-developed and well-nourished.  Cardiovascular: Normal rate.  Pulmonary/Chest: Effort normal.  Abdominal: Soft.  Musculoskeletal: Normal range of motion.  Neurological: She is oriented to person, place, and time.  Skin: Skin is warm and dry.  Psychiatric: She has a normal mood and affect. Her behavior is normal.  Vitals reviewed.   RECENT LABS AND  TESTS: BMET    Component Value Date/Time   NA 137 02/14/2018 1119   NA 138 10/22/2017 1236   K 4.4 02/14/2018 1119   CL 103 02/14/2018 1119   CO2 26 02/14/2018 1119   GLUCOSE 86 02/14/2018 1119   BUN 14 02/14/2018 1119   BUN 10 10/22/2017 1236   CREATININE 0.69 02/14/2018 1119   CALCIUM 9.3 02/14/2018 1119   GFRNONAA 112 02/14/2018 1119   GFRAA 130 02/14/2018 1119   Lab Results  Component Value Date   HGBA1C 4.9 10/22/2017   HGBA1C 5.1 07/11/2017   Lab Results  Component Value Date   INSULIN 8.8 10/22/2017   CBC    Component Value Date/Time   WBC 6.9 02/14/2018 1119   RBC 4.63 02/14/2018 1119   HGB 12.8 02/14/2018 1119   HGB 12.8 10/22/2017 1236   HCT 38.3 02/14/2018 1119   HCT 37.8 10/22/2017 1236   PLT 349 02/14/2018 1119   MCV 82.7 02/14/2018 1119   MCV 84 10/22/2017 1236   MCH 27.6 02/14/2018 1119   MCHC 33.4 02/14/2018 1119   RDW 12.9 02/14/2018 1119   RDW 13.5 10/22/2017 1236   LYMPHSABS 1.7 10/22/2017 1236   EOSABS 0.2 10/22/2017 1236   BASOSABS 0.0 10/22/2017 1236   Iron/TIBC/Ferritin/ %Sat No results found for: IRON, TIBC, FERRITIN, IRONPCTSAT Lipid Panel     Component Value Date/Time   CHOL 179 03/05/2018 1023   TRIG 142 03/05/2018 1023   HDL 52 03/05/2018 1023   LDLCALC 99 03/05/2018 1023   Hepatic Function Panel     Component Value Date/Time   PROT 6.8 02/14/2018 1119   PROT 6.8 10/22/2017 1236   ALBUMIN 4.1 10/22/2017 1236   AST 12 02/14/2018 1119   ALT 12 02/14/2018 1119   ALKPHOS 61 10/22/2017 1236   BILITOT 0.6 02/14/2018 1119   BILITOT 0.5 10/22/2017 1236  Component Value Date/Time   TSH 0.96 02/14/2018 1119   TSH 2.950 10/22/2017 1236   TSH 1.94 07/11/2017 1204   Results for SAORI, UMHOLTZ (MRN [id number]) as of 03/25/2018 15:29  Ref. Range 03/05/2018 10:23  Vitamin D, 25-Hydroxy Latest Ref Range: 30.0 - 100.0 ng/mL 34.4   ASSESSMENT AND PLAN: Vitamin D deficiency - Plan: Vitamin D, Ergocalciferol, (DRISDOL) 50000 units  CAPS capsule  Other depression - with emotional eating  At risk for osteoporosis  Class 3 severe obesity with serious comorbidity and body mass index (BMI) of 50.0 to 59.9 in adult, unspecified obesity type (HCC)  PLAN:  Vitamin D Deficiency Michaela Holland was informed that low vitamin D levels contributes to fatigue and are associated with obesity, breast, and colon cancer. She agrees to continue to take prescription Vit D @50 ,000 IU every week #4 with no refills and will follow up for routine testing of vitamin D, at least 2-3 times per year. She was informed of the risk of over-replacement of vitamin D and agrees to not increase her dose unless she discusses this with [place] first. Michaela Holland agrees to follow up as directed.  At risk for osteopenia and osteoporosis Michaela Holland is at risk for osteopenia and osteoporosis due to her vitamin D deficiency. She was encouraged to take her vitamin D and follow her higher calcium diet and increase strengthening exercise to help strengthen her bones and decrease her risk of osteopenia and osteoporosis.  Depression with Emotional Eating Behaviors We discussed behavior modification techniques today to help Michaela Holland deal with her emotional eating and depression. She has agreed to follow up as directed. We will refer to Dr. Dewaine Conger our bariatric psychologist.  Obesity Michaela Holland is currently in the action stage of change. As such, her goal is to continue with weight loss efforts She has agreed to follow the Category 3 plan Michaela Holland has been instructed to work up to a goal of 150 minutes of combined cardio and strengthening exercise per week for weight loss and overall health benefits. We discussed the following Behavioral Modification Strategies today: increase H2O intake and work on meal planning and easy cooking plans  Michaela Holland has agreed to follow up with our clinic in 2 weeks. She was informed of the importance of frequent follow up visits to maximize her success with intensive lifestyle  modifications for her multiple health conditions.   OBESITY BEHAVIORAL INTERVENTION VISIT  Today's visit was # 9 out of 22.  Starting weight: 313 lbs Starting date: 10/22/17 Today's weight : 307 lbs Today's date: 03/25/2018 Total lbs lost to date: 6    ASK: We discussed the diagnosis of obesity with Michaela Holland today and Michaela Holland agreed to give [place] permission to discuss obesity behavioral modification therapy today.  ASSESS: Kariann has the diagnosis of obesity and her BMI today is 51.98 Michaela Holland is in the action stage of change   ADVISE: Michaela Holland was educated on the multiple health risks of obesity as well as the benefit of weight loss to improve her health. She was advised of the need for long term treatment and the importance of lifestyle modifications.  AGREE: Multiple dietary modification options and treatment options were discussed and  Michaela Holland agreed to the above obesity treatment plan.  Michaela Holland, am acting as transcriptionist for Alois Cliche, PA-C I, Alois Cliche, PA-C have reviewed above note and agree with its content

## 2018-03-26 ENCOUNTER — Ambulatory Visit (INDEPENDENT_AMBULATORY_CARE_PROVIDER_SITE_OTHER): Payer: 59 | Admitting: Orthopaedic Surgery

## 2018-03-26 ENCOUNTER — Encounter (INDEPENDENT_AMBULATORY_CARE_PROVIDER_SITE_OTHER): Payer: Self-pay | Admitting: Orthopaedic Surgery

## 2018-03-26 DIAGNOSIS — M25562 Pain in left knee: Secondary | ICD-10-CM

## 2018-03-26 DIAGNOSIS — M1712 Unilateral primary osteoarthritis, left knee: Secondary | ICD-10-CM

## 2018-03-26 DIAGNOSIS — G8929 Other chronic pain: Secondary | ICD-10-CM

## 2018-03-26 MED ORDER — ACETAMINOPHEN-CODEINE #3 300-30 MG PO TABS: 1.0000 | ORAL_TABLET | Freq: Three times a day (TID) | ORAL | 0 refills | Status: DC | PRN

## 2018-03-26 MED FILL — VIT D2 1.25 MG (50,000 UNIT: 1.25 MG | 28 days supply | Qty: 4 | Fill #0

## 2018-03-26 MED FILL — ACETAMINOPHEN/COD #3 TABLET: 300-30 | 8 days supply | Qty: 50 | Fill #0

## 2018-03-26 NOTE — Progress Notes (Signed)
Michaela Holland is continuing to follow-up for her left knee.  She has known medial compartment arthritis of that knee.  About 3 weeks ago I placed hyaluronic acid injection in the knee.  She has not had relief from that as of yet but she understands this can still take a while.  She does take diclofenac and alternate symptoms ibuprofen.  She is also had Voltaren gel to try.  She is been going to counseling with a nutritionist and working on weight loss.  She is considering bariatric surgery.  We had a long thorough discussion about her need today.  On examination she definitely has medial joint line tenderness and a mild effusion compared to the other side.  Both knees hyperextend.  She understands that weight does play an effect on her knee pain but also she does have arthritis is going to her and her most recent MRI shows this is worsening.  She may end up needing microfracture surgery on that knee but she would like to hold off until she decides what she is going to do about her weight loss.  I agree with this as well.  All questions concerns were answered and addressed.  I would like to continue her on work restrictions that she is presently on for the foreseeable future.  I will send in some Tylenol 3 to take on occasion when the pain is bad enough.  We will see her back in 3 months he is doing overall.

## 2018-03-26 NOTE — Progress Notes (Unsigned)
Office: [phone]  /  Fax: [phone] Date: April 09, 2018 Time Seen:*** Duration:*** Provider: Lawerance Cruel, PsyD Type of Session: Intake for Individual Therapy   Informed Consent:The provider's role was explained to Avrey K Ozanich. The provider discussed issues of confidentiality, privacy, and limits therein; anticipated course of treatment; potential risks involved with psychotherapy; the voluntary nature of treatment; and the clinic's cancellation policy. In addition to written consent, verbal informed consent for psychological services was obtained from [facility] prior to the initial intake interview.   Jennfier was informed that information about mental health appointments will be entered in the medical record at [facility]) via Epic. Moreover, Kiri agreed information may be shared with other [facility] providers as needed for coordination of care. Written consent was also provided for this provider to coordinate care with other providers at [facility].The provider further explained leaving voicemail messages and sending messages via MyChart can be utilized for non-emergency reasons, and limits of confidentiality related to communication via technology was discussed. Furthermore, Fumiko was informed the clinic is not a 24/7 crisis center and mental health emergency resources were shared. Keiyana was given a handout with emergency resources. Bayleigh verbally acknowledged understanding, and agreed to use mental health emergency resources discussed if needed.   Chief Complaint: Drema was referred by Alois Cliche PA-C for depression with emotional eating behaviors. Per the note for the visit with Maia Petties on March 25, 2018, "Margaux reports stress eating with husband's recent surgery. Livian struggles with emotional eating and using food for comfort to the extent that it is negatively impacting her health. She often snacks when she is not hungry.  Britiney sometimes feels she is out of control and then feels guilty that she made poor food choices. She has been working on behavior modification techniques to help reduce her emotional eating and has been somewhat successful. She shows no sign of suicidal or homicidal ideations."    Imo was asked to complete a questionnaire assessing various behaviors related to emotional eating. Christie endorsed the following: *** Overeat when you are celebrating Experience food cravings on a regular basis Eat certain foods when you are anxious, stressed, depressed, or your feelings are hurt Use food to help you cope with emotional situations Find food is comforting to you Overeat when you are angry or upset Overeat when you are worried about something Overeat frequently when you are bored or lonely Not worry about what you eat when you are in a good mood Overeat when you are angry at someone just to show them they cannot control you Overeat when you are alone, but eat much less when you are with other people Eat to help you stay awake Eat as a reward  HPI: Per the note for the initial visit with Dr. Debbra Riding on October 22, 2017, Evianna has been heavy most of her life and she started gaining weight in high school. Her heaviest weight ever was 314 pounds. Latashia also reported the following at her initial appointment: significant food cravings issues; snacking frequently in the evenings; skipping meals frequently; frequently drinking liquids with calories; frequently making poor food choices; frequently eating larger portions than normal; and struggling with emotional eating.  Mental Status Examination: Paislee arrived on time for the appointment. She presented as appropriately dressed and groomed. Hiedi appeared her stated age and demonstrated adequate orientation to time, place, person, and purpose of the appointment. She also demonstrated appropriate eye contact. No  psychomotor abnormalities or behavioral  peculiarities noted. Her mood was *** with congruent affect. Her thought processes were logical, linear, and goal-directed. No hallucinations, delusions, bizarre thinking or behavior reported or observed. Judgment, insight, and impulse control appeared to be grossly intact. There was no evidence of paraphasias (i.e., errors in speech, gross mispronunciations, and word substitutions), repetition deficits, or disturbances in volume or prosody (i.e., rhythm and intonation). There was no evidence of attention or memory impairments. [name] denied current suicidal and homicidal ideation, plan, and intent.   The Mini-Mental State Examination, Second Edition (MMSE-2) was administered. The MMSE-2 briefly screens for cognitive dysfunction and overall mental status and assesses different cognitive domains: orientation, registration, attention and calculation, recall, and language and praxis. Dnya received *** out of 30 points possible on the MMSE-2, which is noted in the *** range. The following points were lost: ***  Family & Psychosocial History: ***  Medical History: ***  Mental Health History: ***  Structured Assessment Results: The Patient Health Questionnaire-9 (PHQ-9) is a self-report measure that assesses symptoms and severity of depression over the course of the last two weeks. Abeera obtained a score of *** suggesting *** depression. [name] finds the endorsed symptoms to be ***.    The Generalized Anxiety Disorder-7 (GAD-7) is a brief self-report measure that assesses symptoms of anxiety over the course of the last two weeks. [name] obtained a score of *** suggesting *** anxiety.    Interventions: A chart review was conducted prior to the clinical intake interview. The MMSE-2, PHQ-9, and GAD-7 were administered and a clinical intake interview was completed. In addition, [name] was asked to complete a Mood and Food questionnaire to assess various behaviors related to emotional eating. Throughout session,  empathic reflections and validation was provided. Continuing treatment with this provider was discussed and treatment goals were established.  Provisional DSM-5 Diagnosis:***  Plan: [name] expressed understanding and agreement with the initial treatment plan of care. She appears able and willing to participate as evidenced by collaboration on treatment goals, engagement in reciprocal conversation, and asking questions as needed for clarification. The next appointment will be scheduled in *** week(s). The following treatment goals were established: ***.

## 2018-03-27 ENCOUNTER — Ambulatory Visit (HOSPITAL_COMMUNITY): Payer: Self-pay | Admitting: Psychiatry

## 2018-03-27 ENCOUNTER — Ambulatory Visit: Payer: Self-pay | Admitting: Physician Assistant

## 2018-03-28 ENCOUNTER — Encounter: Payer: Self-pay | Admitting: Physician Assistant

## 2018-03-28 ENCOUNTER — Ambulatory Visit (INDEPENDENT_AMBULATORY_CARE_PROVIDER_SITE_OTHER): Payer: Self-pay | Admitting: Physician Assistant

## 2018-03-28 VITALS — BP 115/73 | HR 73 | Temp 98.1°F | Ht 64.0 in | Wt 302.0 lb

## 2018-03-28 DIAGNOSIS — N3944 Nocturnal enuresis: Secondary | ICD-10-CM

## 2018-03-28 DIAGNOSIS — L659 Nonscarring hair loss, unspecified: Secondary | ICD-10-CM

## 2018-03-28 DIAGNOSIS — N3281 Overactive bladder: Secondary | ICD-10-CM

## 2018-03-28 LAB — POCT URINALYSIS DIPSTICK
Appearance: NEGATIVE
Bilirubin, UA: NEGATIVE
GLUCOSE UA: NEGATIVE
KETONES UA: NEGATIVE
LEUKOCYTES UA: NEGATIVE
Nitrite, UA: NEGATIVE
Protein, UA: NEGATIVE
RBC UA: NEGATIVE
SPEC GRAV UA: 1.02 (ref 1.010–1.025)
Urobilinogen, UA: 0.2 E.U./dL
pH, UA: 6.5 (ref 5.0–8.0)

## 2018-03-28 MED ORDER — MIRABEGRON ER 25 MG PO TB24
25.0000 mg | ORAL_TABLET | Freq: Every day | ORAL | 3 refills | Status: DC
Start: 2018-03-28 — End: 2018-07-30

## 2018-03-28 MED FILL — NYSTATIN 100000 UNIT/GM POW: 100000 | 10 days supply | Qty: 15 | Fill #0

## 2018-03-28 MED FILL — DICLOFENAC SODIUM 1% GEL: 1 | 6 days supply | Qty: 100 | Fill #0

## 2018-03-28 NOTE — Progress Notes (Signed)
HPI:                                                                Michaela Holland is a 37 y.o. female who presents to [facility] today for urinary incontinence   Patient reports long history of bedwetting since childhood. States she was told by neurology that she was having "pre-seizure activity," which could have been the cause. She was treated with Gabapentin and she self-discontinued this at the time due to sedation. Recently began taking Gabapentin 300 mg tid for radiculopathy.  Symptoms resolved on their own for approximately 3.5 years, but recurred approximately 1 month ago. She is having nightly enuresis. She is also having nocturia twice per night. She is having frequency 9-10 times per day, urgency and overflow incontinence. Denies dysuria, hesistancy, hematuria. Has tried reducing caffeine, reducing fluids, avoiding fluids after 6 pm. Has been on Detrol LA and Imipramine, which were helpful.  Depression screen [facility] 2/9 01/09/2018 10/22/2017 05/08/2017 04/09/2017  Decreased Interest '1 3 1 1  ' Down, Depressed, Hopeless '1 3 1 1  ' PHQ - 2 Score '2 6 2 2  ' Altered sleeping 0 2 - -  Tired, decreased energy 3 3 - -  Change in appetite 2 3 - -  Feeling bad or failure about yourself  1 3 - -  Trouble concentrating 0 1 - -  Moving slowly or fidgety/restless 0 1 - -  Suicidal thoughts 0 0 - -  PHQ-9 Score 8 19 - -  Difficult doing work/chores Somewhat difficult Very difficult - -    GAD 7 : Generalized Anxiety Score 01/09/2018  Nervous, Anxious, on Edge 2  Control/stop worrying 1  Worry too much - different things 1  Trouble relaxing 2  Restless 0  Easily annoyed or irritable 3  Afraid - awful might happen 0  Total GAD 7 Score 9  Anxiety Difficulty Very difficult      Past Medical History:  Diagnosis Date  . Anxiety   . Back pain   . Chronic pain of left knee   . Depression   . Diabetes mellitus without complication ([place])    denies  this diagnosis   . Family history of adverse reaction to anesthesia    per patient ; father was having abdominal surgery and had to be placed on medicine to keep his blood pressure up; he never had any issues with low BP before the surgery "   . Gallbladder problem   . GERD (gastroesophageal reflux disease)   . Gout    believes only occurred once in feet;   . Hypertension    denies this diagnosis   . Hypothyroidism   . Joint pain   . Obesity   . Osteoarthritis   . Palpitations   . PCOS (polycystic ovarian syndrome)   . PONV (postoperative nausea and vomiting)    with c section; no issues with followwing procedures   . Seasonal allergies   . Seizures ([place])    has had "pre-seizure" activity in the brain but deneis any actual seizures   . Sleep apnea    does not currently use due to insurance   . Vitamin D deficiency    Past Surgical History:  Procedure  Laterality Date  . CESAREAN SECTION    . CHOLECYSTECTOMY    . ESOPHAGOGASTRODUODENOSCOPY  2001 and 2018  . GANGLION CYST EXCISION Left 2014   foot  . KNEE ARTHROSCOPY Left 06/28/2017   Procedure: LEFT KNEE ARTHROSCOPY with debridement;  Surgeon: Mcarthur Rossetti, MD;  Location: [facility];  Service: Orthopedics;  Laterality: Left;   Social History   Tobacco Use  . Smoking status: Never Smoker  . Smokeless tobacco: Never Used  Substance Use Topics  . Alcohol use: Never    Frequency: Never   family history includes Anxiety disorder in her father; Cancer in her father; Depression in her father and mother; Hyperlipidemia in her father and mother; Hypertension in her father; Hypothyroidism in her mother and sister; Obesity in her father and mother; Polycystic ovary syndrome in her mother and sister.    ROS: Review of Systems  Gastrointestinal: Positive for heartburn.  Genitourinary: Positive for frequency and urgency. Negative for dysuria, flank pain and hematuria.  Musculoskeletal: Positive for back pain.  Skin:       +  hair loss  Endo/Heme/Allergies:       + hypothyroidism   All other systems reviewed and are negative.    Medications: Current Outpatient Medications  Medication Sig Dispense Refill  . acetaminophen-codeine (TYLENOL #3) 300-30 MG tablet Take 1-2 tablets by mouth every 8 (eight) hours as needed for moderate pain. 50 tablet 0  . albuterol (PROVENTIL HFA;VENTOLIN HFA) 108 (90 Base) MCG/ACT inhaler Inhale 1-2 puffs into the lungs every 4 (four) hours as needed for wheezing or shortness of breath (bronchospasm). 1 Inhaler 0  . Black Pepper-Turmeric (TURMERIC CURCUMIN) 01-999 MG CAPS Take 1 capsule by mouth daily.    . Brexpiprazole (REXULTI) 1 MG TABS Take 1 tablet (1 mg total) by mouth every morning. 90 tablet 0  . citalopram (CELEXA) 40 MG tablet Take 1 tablet (40 mg total) by mouth daily. 90 tablet 0  . clonazePAM (KLONOPIN) 0.5 MG tablet Take 0.5-1 tablets (0.25-0.5 mg total) by mouth 2 (two) times daily as needed for anxiety (#30 for 90 days.). 30 tablet 0  . Cyanocobalamin 2000 MCG TBCR Take 1 tablet (2,000 mcg total) by mouth daily. 30 tablet 0  . cyclobenzaprine (FLEXERIL) 10 MG tablet Take by mouth.    . diclofenac (VOLTAREN) 75 MG EC tablet TAKE 1 TABLET BY MOUTH 2 TIMES DAILY. 60 tablet 1  . EPINEPHrine 0.3 mg/0.3 mL IJ SOAJ injection Inject into the muscle.    . fluconazole (DIFLUCAN) 150 MG tablet     . gabapentin (NEURONTIN) 300 MG capsule One tab PO qHS for a week, then BID for a week, then TID. May double weekly to a max of 3,645m/day 180 capsule 3  . Glucosamine-Chondroit-Vit C-Mn (GLUCOSAMINE CHONDR 1500 COMPLX PO) Take 1 capsule by mouth daily.    .Marland Kitchenibuprofen (ADVIL,MOTRIN) 200 MG tablet Take 800 mg by mouth every 8 (eight) hours as needed (for pain.).    .Marland Kitchenlevothyroxine (SYNTHROID, LEVOTHROID) 175 MCG tablet Take 1 tablet (175 mcg total) by mouth daily before breakfast. 90 tablet 3  . metFORMIN (GLUCOPHAGE-XR) 500 MG 24 hr tablet Take 1 tablet (500 mg total) by mouth at  bedtime. (Patient taking differently: Take 1,000 mg by mouth at bedtime. ) 90 tablet 3  . Nystatin POWD Apply liberally to affected area 2 times per day 1 Bottle 11  . omeprazole (PRILOSEC) 40 MG capsule Take 1 capsule (40 mg total) by mouth daily. 30 capsule 6  .  Prenatal MV-Min-FA-Omega-3 (PRENATAL GUMMIES/DHA & FA) 0.4-32.5 MG CHEW Chew by mouth.    . ranitidine (ZANTAC) 150 MG tablet Take 150 mg by mouth daily as needed for heartburn.     . Vitamin D, Ergocalciferol, (DRISDOL) 50000 units CAPS capsule Take 1 capsule (50,000 Units total) by mouth every 7 (seven) days. 4 capsule 0   No current facility-administered medications for this visit.    Allergies  Allergen Reactions  . Sulfa Antibiotics Hives and Shortness Of Breath  . Food     Allergy to walnuts, pecans, [place] nuts, hazel nuts (she can eat other nuts).  . Other     Nuts severe reaction difficulty breathing , throat swelling; mostly tree nuts but not all tree nuts        Objective:  BP 115/73   Pulse 73   Temp 98.1 F (36.7 C) (Oral)   Ht '5\' 4"'  (1.626 m)   Wt (!) 302 lb (137 kg)   LMP 03/13/2018 (Exact Date)   SpO2 97%   BMI 51.84 kg/m  Gen:  alert, not ill-appearing, no distress, appropriate for age, obese female HEENT: head normocephalic without obvious abnormality, conjunctiva and cornea clear, trachea midline Pulm: Normal work of breathing, normal phonation Neuro: alert and oriented x 3, no tremor MSK: extremities atraumatic, normal gait and station Skin: intact, no rashes on exposed skin, no jaundice, no cyanosis Psych: well-groomed, cooperative, good eye contact, euthymic mood, affect mood-congruent, speech is articulate, and thought processes clear and goal-directed    No results found for this or any previous visit (from the past 72 hour(s)). No results found.    Assessment and Plan: 37 y.o. female with   Urinary incontinence, nocturnal enuresis - Plan: Ambulatory referral to Neurology, mirabegron  ER (MYRBETRIQ) 25 MG TB24 tablet, Urine Culture, POCT Urinalysis Dipstick  Overactive bladder - Plan: mirabegron ER (MYRBETRIQ) 25 MG TB24 tablet  Hair loss - Plan: Ambulatory referral to Dermatology  Symptoms are consistent with OAB. UA negative. We will start Myrbetriq. Follow-up with PCP in 4-6 weeks. Patient is concerned about possibility of underlying seizure disorder. Referring to neurology for eval She is also concerned about hair loss. Labs performed by PCP including ANA, B12, TSH, testosterone, ESR, CRP, RF were unremarkable. Referring to Dermatology  Patient education and anticipatory guidance given Patient agrees with treatment plan Follow-up as needed if symptoms worsen or fail to improve  I spent 25 minutes with this patient, greater than 50% was face-to-face time counseling regarding the above diagnoses  Darlyne Russian PA-C

## 2018-03-28 NOTE — Patient Instructions (Signed)

## 2018-03-29 LAB — URINE CULTURE
MICRO NUMBER: 90858079
RESULT: NO GROWTH
SPECIMEN QUALITY: ADEQUATE

## 2018-03-31 ENCOUNTER — Telehealth: Payer: Self-pay | Admitting: Physician Assistant

## 2018-03-31 NOTE — Telephone Encounter (Signed)
Received fax from [employer] that Myrbetriq requires a PA. Information has been sent to the insurance company. Awaiting determination.

## 2018-04-02 MED FILL — MYRBETRIQ ER 25 MG TABLET: 25 | 30 days supply | Qty: 30 | Fill #0 | Status: TO

## 2018-04-02 NOTE — Telephone Encounter (Signed)
Received fax from [employer] that Mrybetriq was approved from 03/28/2018 through 03/28/2019. Pharmacy notified and forms sent to scan.   Reference ID: [id number].

## 2018-04-07 MED FILL — REXULTI 1 MG TABLET: 1 | 90 days supply | Qty: 90 | Fill #0

## 2018-04-08 ENCOUNTER — Encounter: Payer: Self-pay | Admitting: Physician Assistant

## 2018-04-09 ENCOUNTER — Ambulatory Visit (INDEPENDENT_AMBULATORY_CARE_PROVIDER_SITE_OTHER): Payer: 59 | Admitting: Family Medicine

## 2018-04-09 ENCOUNTER — Ambulatory Visit (INDEPENDENT_AMBULATORY_CARE_PROVIDER_SITE_OTHER): Payer: Self-pay | Admitting: Psychology

## 2018-04-09 ENCOUNTER — Encounter (INDEPENDENT_AMBULATORY_CARE_PROVIDER_SITE_OTHER): Payer: Self-pay

## 2018-04-09 VITALS — BP 136/84 | HR 83 | Temp 97.9°F | Ht 64.0 in | Wt 313.0 lb

## 2018-04-09 DIAGNOSIS — E559 Vitamin D deficiency, unspecified: Secondary | ICD-10-CM

## 2018-04-09 DIAGNOSIS — Z9189 Other specified personal risk factors, not elsewhere classified: Secondary | ICD-10-CM

## 2018-04-09 DIAGNOSIS — Z6841 Body Mass Index (BMI) 40.0 and over, adult: Secondary | ICD-10-CM

## 2018-04-09 DIAGNOSIS — F3289 Other specified depressive episodes: Secondary | ICD-10-CM | POA: Diagnosis not present

## 2018-04-09 MED ORDER — VITAMIN D (ERGOCALCIFEROL) 1.25 MG (50000 UNIT) PO CAPS: 50000.0000 [IU] | ORAL_CAPSULE | ORAL | 0 refills | Status: DC

## 2018-04-09 NOTE — Progress Notes (Addendum)
Office: [phone]  /  Fax: [phone] Date: April 14, 2018 Time Seen: 3:02pm Duration: 60 minutes Provider: Glennie Isle, PsyD Type of Session: Intake for Individual Therapy   Informed Consent:The provider's role was explained to Michaela Holland. The provider discussed issues of confidentiality, privacy, and limits therein; anticipated course of treatment; potential risks involved with psychotherapy; the voluntary nature of treatment; and the clinic's cancellation policy. The provider also discussed billing, as it relates to insurance and the patient's responsibility. In addition to written consent, verbal informed consent for psychological services was obtained from Michaela Holland prior to the initial intake interview.   Michaela Holland was informed that information about mental health appointments will be entered in the medical record at [facility]) via [place]. Moreover, Michaela Holland agreed information may be shared with other [facility] providers as needed for coordination of care. Written consent was also provided for this provider to coordinate care with other providers at [facility].The provider further explained leaving voicemail messages and sending messages via MyChart can be utilized for non-emergency reasons, and limits of confidentiality related to communication via technology was discussed. Furthermore, Michaela Holland was informed the clinic is not a 24/7 crisis center and mental health emergency resources were shared. Michaela Holland was given a handout with emergency resources. Michaela Holland verbally acknowledged understanding, and agreed to use mental health emergency resources discussed if needed.    Chief Complaint: Michaela Holland was referred by Abby Potash PA-C for depression with emotional eating behaviors. Per the note for the visit with [name] Held on March 25, 2018, "Michaela Holland reports stress eating with husband's recent surgery. Michaela Holland struggles with emotional eating and using food  for comfort to the extent that it is negatively impacting her health. She often snacks when she is not hungry. Zabria sometimes feels she is out of control and then feels guilty that she made poor food choices. She has been working on behavior modification techniques to help reduce her emotional eating and has been somewhat successful. She shows no sign of suicidal or homicidal ideations."   Michaela Holland reported she eats for "many different reasons." She explained she eats for hunger, and various emotions. Michaela Holland noted she does not eat to the point of being sick. She denied starving herself and purging. She described increased awareness recently related to her eating patterns. Michaela Holland noted, "I'm looking into weight loss of surgery."   Michaela Holland was asked to complete a questionnaire assessing various behaviors related to emotional eating. Michaela Holland endorsed the following: Overeat when you are celebrating Experience food cravings on a regular basis Eat certain foods when you are anxious, stressed, depressed, or your feelings are hurt Use food to help you cope with emotional situations Find food is comforting to you Overeat when you are angry or upset Overeat when you are worried about something Overeat frequently when you are bored or lonely Not worry about what you eat when you are in a good mood Overeat when you are alone, but eat much less when you are with other people Eat to help you stay awake Eat as a reward  HPI: Per the note for the initial visit with Dr. Ilene Qua on October 22, 2017, Michaela Holland has been heavy most of her life and she started gaining weight in high school. Her heaviest weight ever was 314 pounds. Michaela Holland also reported the following at her initial appointment: significant food cravings issues; snacking frequently in the evenings; skipping meals frequently; frequently drinking liquids with calories; frequently making  poor food choices; frequently eating larger portions than normal; and struggling  with emotional eating. Per Michaela Holland during today's appointment, emotional eating started in high school after she started working at [employer]. She feels she got "addicted" to eating pizza and bakery items there as she was allowed to eat those items while she was working. Once she started college, Michaela Holland shared she was on her own and her "helping sizes were large."   Mental Status Examination: Michaela Holland arrived early for the appointment; however, the appointment was initiated two minutes late due to the provider. She presented as appropriately dressed and groomed. Michaela Holland appeared her stated age and demonstrated adequate orientation to time, place, person, and purpose of the appointment. She also demonstrated appropriate eye contact. No psychomotor abnormalities or behavioral peculiarities noted. Her mood was euthymic with congruent affect. Her thought processes were logical, linear, and goal-directed. No hallucinations, delusions, bizarre thinking or behavior reported or observed. Judgment, insight, and impulse control appeared to be grossly intact. There was no evidence of paraphasias (i.e., errors in speech, gross mispronunciations, and word substitutions), repetition deficits, or disturbances in volume or prosody (i.e., rhythm and intonation). There was no evidence of attention or memory impairments. Michaela Holland denied current suicidal and homicidal ideation, plan, and intent.   The Mini-Mental State Examination, Second Edition (MMSE-2) was administered. The MMSE-2 briefly screens for cognitive dysfunction and overall mental status and assesses different cognitive domains: orientation, registration, attention and calculation, recall, and language and praxis. Michaela Holland received 30 out of 30 points possible on the MMSE-2.  Family & Psychosocial History: Michaela Holland reported she has been married for over four years and she noted she has one daughter (age 68). 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 She is on disability and searching for a job to transfer to another department. 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 She noted her highest level of education is a bachelor's degree in education. Michaela Holland shared her social support system consists of her husband, her parents, her sister, and "many, many very good friends." She added, "I have an awesome support sytsem."   Medical History: Past Medical History:  Diagnosis Date  . Anxiety   . Back pain   . Chronic pain of left knee   . Depression   . Diabetes mellitus without complication ([place])    denies this diagnosis   . Family history of adverse reaction to anesthesia    per patient ; father was having abdominal surgery and had to be placed on medicine to keep his blood pressure up; he never had any issues with low BP before the surgery "   . Gallbladder problem   . GERD (gastroesophageal reflux disease)   . Gout    believes only occurred once in feet;   . Hypertension    denies this diagnosis   . Hypothyroidism   . Joint pain   . Obesity   . Osteoarthritis   . Palpitations   . PCOS (polycystic ovarian syndrome)   . PONV (postoperative nausea and vomiting)    with c section; no issues with followwing procedures   . Seasonal allergies   . Seizures ([place])    has had "pre-seizure" activity in the brain but deneis any actual seizures   . Sleep apnea    does not currently use due to insurance   . Vitamin D  deficiency    Past Surgical History:  Procedure Laterality Date  . CESAREAN SECTION    . CHOLECYSTECTOMY    . ESOPHAGOGASTRODUODENOSCOPY  2001 and 2018  . GANGLION CYST EXCISION Left 2014   foot  . KNEE ARTHROSCOPY Left 06/28/2017   Procedure: LEFT KNEE ARTHROSCOPY with debridement;  Surgeon: Mcarthur Rossetti, MD;  Location: [facility];  Service: Orthopedics;  Laterality: Left;   Current Outpatient Medications on File Prior  to Visit  Medication Sig Dispense Refill  . acetaminophen-codeine (TYLENOL #3) 300-30 MG tablet Take 1-2 tablets by mouth every 8 (eight) hours as needed for moderate pain. 50 tablet 0  . albuterol (PROVENTIL HFA;VENTOLIN HFA) 108 (90 Base) MCG/ACT inhaler Inhale 1-2 puffs into the lungs every 4 (four) hours as needed for wheezing or shortness of breath (bronchospasm). 1 Inhaler 0  . Black Pepper-Turmeric (TURMERIC CURCUMIN) 01-999 MG CAPS Take 1 capsule by mouth daily.    . Brexpiprazole (REXULTI) 1 MG TABS Take 1 tablet (1 mg total) by mouth every morning. 90 tablet 0  . citalopram (CELEXA) 40 MG tablet Take 1 tablet (40 mg total) by mouth daily. 90 tablet 0  . clonazePAM (KLONOPIN) 0.5 MG tablet Take 0.5-1 tablets (0.25-0.5 mg total) by mouth 2 (two) times daily as needed for anxiety (#30 for 90 days.). 30 tablet 0  . Cyanocobalamin 2000 MCG TBCR Take 1 tablet (2,000 mcg total) by mouth daily. 30 tablet 0  . cyclobenzaprine (FLEXERIL) 10 MG tablet Take by mouth.    . diclofenac (VOLTAREN) 75 MG EC tablet TAKE 1 TABLET BY MOUTH 2 TIMES DAILY. 60 tablet 1  . EPINEPHrine 0.3 mg/0.3 mL IJ SOAJ injection Inject into the muscle.    . fluconazole (DIFLUCAN) 150 MG tablet     . gabapentin (NEURONTIN) 300 MG capsule One tab PO qHS for a week, then BID for a week, then TID. May double weekly to a max of 3,632m/day 180 capsule 3  . Glucosamine-Chondroit-Vit C-Mn (GLUCOSAMINE CHONDR 1500 COMPLX PO) Take 1 capsule by mouth daily.    .Marland Kitchenibuprofen (ADVIL,MOTRIN) 200 MG tablet Take 800 mg by mouth every 8 (eight) hours as needed (for pain.).    .Marland Kitchenlevothyroxine (SYNTHROID, LEVOTHROID) 175 MCG tablet Take 1 tablet (175 mcg total) by mouth daily before breakfast. 90 tablet 3  . metFORMIN (GLUCOPHAGE-XR) 500 MG 24 hr tablet Take 1 tablet (500 mg total) by mouth at bedtime. (Patient taking differently: Take 1,000 mg by mouth at bedtime. ) 90 tablet 3  . mirabegron ER (MYRBETRIQ) 25 MG TB24 tablet Take 1 tablet (25  mg total) by mouth daily. 30 tablet 3  . Nystatin POWD Apply liberally to affected area 2 times per day 1 Bottle 11  . omeprazole (PRILOSEC) 40 MG capsule Take 1 capsule (40 mg total) by mouth daily. 30 capsule 6  . Prenatal MV-Min-FA-Omega-3 (PRENATAL GUMMIES/DHA & FA) 0.4-32.5 MG CHEW Chew by mouth.    . ranitidine (ZANTAC) 150 MG tablet Take 150 mg by mouth daily as needed for heartburn.     . Vitamin D, Ergocalciferol, (DRISDOL) 50000 units CAPS capsule Take 1 capsule (50,000 Units total) by mouth every 7 (seven) days. 4 capsule 0   No current facility-administered medications on file prior to visit.    Regarding diabetes, AMaddixreported, "I do not have diabetes. I have PCOS and that is why I am on Metformin." She indicated she also does not have hypertension. She shared she is experiencing "urinary urgency" currently and is receiving  treatment. Regarding surgical history, she added she has also had her gallbladder removed. Moreover, Michaela Holland shared she takes Vitamin B 12. She reported she fell out of a tree at the age of [age] resulting in loss of consciousness. Anayiah shared she had a concussion and lost consciousness for an unknown period of time. She noted, "I remember the ambulance so probably not for long."   Mental Health History: Currently, Shantrice sees a psychiatrist for anxiety and depression. She noted she is prescribed Celexa, Klonopin, and Rexulti. She shared she feels the medications are helping. Abbie noted, "I feel the Reluxti is causing some anxiety." Thus, it was recommended she speak with her psychiatrist. She agreed. A couple months ago, Anieya stopped seeing her counselor as the therapist was not attentive in her last two sessions. She noted a plan to initiate marriage counseling.  Deandre shared she has been in therapy before. The first time was "a couple of years" when she resided in [place] for emotional eating. Shina denied a diagnosis of an eating disorder and she only met with the  therapist "a couple times." Ledora denied a history of hospitalization for psychiatric concerns. However, reported she experienced suicidal ideation and her primary care physician recommended hospitalization. Fayola noted she was not hospitalized and was instead started on a new medication. Monseratt reported both her parents suffer from depression and her father also suffers from anxiety. Ladene denied a history of trauma, including sexual, physical, and psychological abuse, as well as neglect.   Elsye currently experiences the following: anhedonia; feeling down; fatigue; overeating; low self-esteem; and worry thoughts. Tonnia denied the following: obsessions and compulsions; mania; attention and concentration issues; hallucinations and delusions; engagement in self-harm; substance use; and history of and current homicidal ideation, plan, and intent. She also denied current suicidal ideation, plan, and intent.   Regarding suicidal ideation, Jacolyn noted she experienced ideation in 2017 as noted above. She noted, "I started to have a plan. Not a well thought out of plan and that is when I went to my doctor." She informed her doctor at the time she could never end her life due to her family and friends. Indianna denied having intent. 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 She explained she was stressed financially. Once the semester was completed and they were married, she stated, "I felt so much better." At that time, Heath denied having plan and intent. At that time, Aveen also sought help from her primary care physician. The following protective factors were identified for Jullian: child, husband, parents, friends, and religion. She noted she identifies with Christianity and she feels that suicide is not the way to handle life. If she were to become overwhelmed in the future, which is a sign that a crisis may occur, she identified the following coping skills she could  engage in: prioritize what needs to be done; check things off her to do list; go for a swim; spend time with her family; be in the sun; listen to calming music, deep breathe; and take time for herself. Poonam's confidence in utilizing emergency resources should the feeling of being overwhelm intensify was assessed on a scale of one to ten where one is not confident and ten is extremely confident. Kelise reported her confidence is a 10.    Structured Assessment Results: The Patient Health Questionnaire-9 (PHQ-9) is a self-report measure that assesses symptoms and severity of depression over the course of the last two  weeks. Kieu obtained a score of six suggesting mild depression. Novia finds the endorsed symptoms to be somewhat difficult. Depression screen PHQ 2/9 04/14/2018  Decreased Interest 1  Down, Depressed, Hopeless 1  PHQ - 2 Score 2  Altered sleeping 0  Tired, decreased energy 1  Change in appetite 2  Feeling bad or failure about yourself  1  Trouble concentrating 0  Moving slowly or fidgety/restless 0  Suicidal thoughts 0  PHQ-9 Score 6  Difficult doing work/chores -    The Generalized Anxiety Disorder-7 (GAD-7) is a brief self-report measure that assesses symptoms of anxiety over the course of the last two weeks. Ailany obtained a score of 11 suggesting moderate anxiety.  GAD 7 : Generalized Anxiety Score 04/14/2018 01/09/2018  Nervous, Anxious, on Edge 3 2  Control/stop worrying 2 1  Worry too much - different things 2 1  Trouble relaxing 2 2  Restless 0 0  Easily annoyed or irritable 2 3  Afraid - awful might happen 0 0  Total GAD 7 Score 11 9  Anxiety Difficulty Somewhat difficult Very difficult   Interventions: A chart review was conducted prior to the clinical intake interview. The MMSE-2, PHQ-9, and GAD-7 were administered and a clinical intake interview was completed. In addition, Lujain was asked to complete a Mood and Food questionnaire to assess various behaviors related to  emotional eating. Throughout session, empathic reflections and validation was provided. Continuing treatment with this provider was discussed and a treatment goal was established. Psychoeducation regarding physical versus emotional hunger was provided. Villa was provided a handout to utilize between now and the next appointment to increase awareness of hunger patterns and subsequent eating. She agreed to use it.   Provisional DSM-5 Diagnosis: 311 (F32.8) Other Specified Depressive Disorder, With Anxious Distress and Emotional Eating Behaviors  Plan: Prentiss expressed understanding and agreement with the initial treatment plan of care. She appears able and willing to participate as evidenced by collaboration on a treatment goal, engagement in reciprocal conversation, and asking questions as needed for clarification. The next appointment will be scheduled in two weeks. The following treatment goal was established: reduce emotional eating.

## 2018-04-10 ENCOUNTER — Encounter (HOSPITAL_COMMUNITY): Payer: Self-pay | Admitting: Psychiatry

## 2018-04-10 NOTE — Progress Notes (Signed)
Office: [phone]  /  Fax: [phone]   HPI:   Chief Complaint: OBESITY Michaela Holland is here to discuss her progress with her obesity treatment plan. She is on the Category 3 plan and is following her eating plan approximately 25 % of the time. She states she is swimming and walking 60 minutes 3 times per week. Michaela Holland is struggling with following the category 3 meal plan. She reports wanting to eat whatever she wants prior to weight loss surgery. She is ready to get back on track. Her weight is (!) 313 lb (142 kg) today and has had a weight gain of 6 pounds over a period of 2 weeks since her last visit. She has lost 0 lbs since starting treatment with [place].  Vitamin D deficiency Michaela Holland has a diagnosis of vitamin D deficiency. She is currently taking vit D and denies nausea, vomiting or muscle weakness.  At risk for osteopenia and osteoporosis Michaela Holland is at higher risk of osteopenia and osteoporosis due to vitamin D deficiency.   Depression with emotional eating behaviors Michaela Holland reports recent episodes of emotional eating due to stress at home. She struggles with emotional eating and using food for comfort to the extent that it is negatively impacting her health. She often snacks when she is not hungry. Michaela Holland sometimes feels she is out of control and then feels guilty that she made poor food choices. She has been working on behavior modification techniques to help reduce her emotional eating and has been somewhat successful. She shows no sign of suicidal or homicidal ideations.  Depression screen [facility] 2/9 01/09/2018 10/22/2017 05/08/2017 04/09/2017  Decreased Interest 1 3 1 1   Down, Depressed, Hopeless 1 3 1 1   PHQ - 2 Score 2 6 2 2   Altered sleeping 0 2 - -  Tired, decreased energy 3 3 - -  Change in appetite 2 3 - -  Feeling bad or failure about yourself  1 3 - -  Trouble concentrating 0 1 - -  Moving slowly or fidgety/restless 0 1 - -  Suicidal thoughts 0 0 - -  PHQ-9 Score 8 19 - -  Difficult doing  work/chores Somewhat difficult Very difficult - -      ALLERGIES: Allergies  Allergen Reactions  . Sulfa Antibiotics Hives and Shortness Of Breath  . Food     Allergy to walnuts, pecans, [place] nuts, hazel nuts (she can eat other nuts).  . Other     Nuts severe reaction difficulty breathing , throat swelling; mostly tree nuts but not all tree nuts     MEDICATIONS: Current Outpatient Medications on File Prior to Visit  Medication Sig Dispense Refill  . acetaminophen-codeine (TYLENOL #3) 300-30 MG tablet Take 1-2 tablets by mouth every 8 (eight) hours as needed for moderate pain. 50 tablet 0  . albuterol (PROVENTIL HFA;VENTOLIN HFA) 108 (90 Base) MCG/ACT inhaler Inhale 1-2 puffs into the lungs every 4 (four) hours as needed for wheezing or shortness of breath (bronchospasm). 1 Inhaler 0  . Black Pepper-Turmeric (TURMERIC CURCUMIN) 01-999 MG CAPS Take 1 capsule by mouth daily.    . Brexpiprazole (REXULTI) 1 MG TABS Take 1 tablet (1 mg total) by mouth every morning. 90 tablet 0  . citalopram (CELEXA) 40 MG tablet Take 1 tablet (40 mg total) by mouth daily. 90 tablet 0  . clonazePAM (KLONOPIN) 0.5 MG tablet Take 0.5-1 tablets (0.25-0.5 mg total) by mouth 2 (two) times daily as needed for anxiety (#30 for 90 days.). 30 tablet  0  . Cyanocobalamin 2000 MCG TBCR Take 1 tablet (2,000 mcg total) by mouth daily. 30 tablet 0  . cyclobenzaprine (FLEXERIL) 10 MG tablet Take by mouth.    . diclofenac (VOLTAREN) 75 MG EC tablet TAKE 1 TABLET BY MOUTH 2 TIMES DAILY. 60 tablet 1  . EPINEPHrine 0.3 mg/0.3 mL IJ SOAJ injection Inject into the muscle.    . fluconazole (DIFLUCAN) 150 MG tablet     . gabapentin (NEURONTIN) 300 MG capsule One tab PO qHS for a week, then BID for a week, then TID. May double weekly to a max of 3,600mg /day 180 capsule 3  . Glucosamine-Chondroit-Vit C-Mn (GLUCOSAMINE CHONDR 1500 COMPLX PO) Take 1 capsule by mouth daily.    Marland Kitchen. ibuprofen (ADVIL,MOTRIN) 200 MG tablet Take 800 mg by  mouth every 8 (eight) hours as needed (for pain.).    Marland Kitchen. levothyroxine (SYNTHROID, LEVOTHROID) 175 MCG tablet Take 1 tablet (175 mcg total) by mouth daily before breakfast. 90 tablet 3  . metFORMIN (GLUCOPHAGE-XR) 500 MG 24 hr tablet Take 1 tablet (500 mg total) by mouth at bedtime. (Patient taking differently: Take 1,000 mg by mouth at bedtime. ) 90 tablet 3  . mirabegron ER (MYRBETRIQ) 25 MG TB24 tablet Take 1 tablet (25 mg total) by mouth daily. 30 tablet 3  . Nystatin POWD Apply liberally to affected area 2 times per day 1 Bottle 11  . omeprazole (PRILOSEC) 40 MG capsule Take 1 capsule (40 mg total) by mouth daily. 30 capsule 6  . Prenatal MV-Min-FA-Omega-3 (PRENATAL GUMMIES/DHA & FA) 0.4-32.5 MG CHEW Chew by mouth.    . ranitidine (ZANTAC) 150 MG tablet Take 150 mg by mouth daily as needed for heartburn.      No current facility-administered medications on file prior to visit.     PAST MEDICAL HISTORY: Past Medical History:  Diagnosis Date  . Anxiety   . Back pain   . Chronic pain of left knee   . Depression   . Diabetes mellitus without complication (HCC)    denies this diagnosis   . Family history of adverse reaction to anesthesia    per patient ; father was having abdominal surgery and had to be placed on medicine to keep his blood pressure up; he never had any issues with low BP before the surgery "   . Gallbladder problem   . GERD (gastroesophageal reflux disease)   . Gout    believes only occurred once in feet;   . Hypertension    denies this diagnosis   . Hypothyroidism   . Joint pain   . Obesity   . Osteoarthritis   . Palpitations   . PCOS (polycystic ovarian syndrome)   . PONV (postoperative nausea and vomiting)    with c section; no issues with followwing procedures   . Seasonal allergies   . Seizures (HCC)    has had "pre-seizure" activity in the brain but deneis any actual seizures   . Sleep apnea    does not currently use due to insurance   . Vitamin D  deficiency     PAST SURGICAL HISTORY: Past Surgical History:  Procedure Laterality Date  . CESAREAN SECTION    . CHOLECYSTECTOMY    . ESOPHAGOGASTRODUODENOSCOPY  2001 and 2018  . GANGLION CYST EXCISION Left 2014   foot  . KNEE ARTHROSCOPY Left 06/28/2017   Procedure: LEFT KNEE ARTHROSCOPY with debridement;  Surgeon: Kathryne HitchBlackman, Christopher Y, MD;  Location: [facility];  Service: Orthopedics;  Laterality: Left;  SOCIAL HISTORY: Social History   Tobacco Use  . Smoking status: Never Smoker  . Smokeless tobacco: Never Used  Substance Use Topics  . Alcohol use: Never    Frequency: Never  . Drug use: No    FAMILY HISTORY: Family History  Problem Relation Age of Onset  . Polycystic ovary syndrome Mother   . Hypothyroidism Mother   . Hyperlipidemia Mother   . Depression Mother   . Obesity Mother   . Polycystic ovary syndrome Sister   . Hypothyroidism Sister   . Hypertension Father   . Hyperlipidemia Father   . Cancer Father   . Depression Father   . Anxiety disorder Father   . Obesity Father     ROS: Review of Systems  Constitutional: Negative for weight loss.  Gastrointestinal: Negative for nausea and vomiting.  Musculoskeletal:       Negative for muscle weakness  Psychiatric/Behavioral: Positive for depression. Negative for suicidal ideas.       Positive for stress    PHYSICAL EXAM: Blood pressure 136/84, pulse 83, temperature 97.9 F (36.6 C), temperature source Oral, height 5\' 4"  (1.626 m), weight (!) 313 lb (142 kg), last menstrual period 03/13/2018, SpO2 98 %. Body mass index is 53.73 kg/m. Physical Exam  Constitutional: She is oriented to person, place, and time. She appears well-developed and well-nourished.  Cardiovascular: Normal rate.  Pulmonary/Chest: Effort normal.  Musculoskeletal: Normal range of motion.  Neurological: She is oriented to person, place, and time.  Skin: Skin is warm and dry.  Psychiatric: She has a normal mood and affect. Her  behavior is normal.  Vitals reviewed.   RECENT LABS AND TESTS: BMET    Component Value Date/Time   NA 137 02/14/2018 1119   NA 138 10/22/2017 1236   K 4.4 02/14/2018 1119   CL 103 02/14/2018 1119   CO2 26 02/14/2018 1119   GLUCOSE 86 02/14/2018 1119   BUN 14 02/14/2018 1119   BUN 10 10/22/2017 1236   CREATININE 0.69 02/14/2018 1119   CALCIUM 9.3 02/14/2018 1119   GFRNONAA 112 02/14/2018 1119   GFRAA 130 02/14/2018 1119   Lab Results  Component Value Date   HGBA1C 4.9 10/22/2017   HGBA1C 5.1 07/11/2017   Lab Results  Component Value Date   INSULIN 8.8 10/22/2017   CBC    Component Value Date/Time   WBC 6.9 02/14/2018 1119   RBC 4.63 02/14/2018 1119   HGB 12.8 02/14/2018 1119   HGB 12.8 10/22/2017 1236   HCT 38.3 02/14/2018 1119   HCT 37.8 10/22/2017 1236   PLT 349 02/14/2018 1119   MCV 82.7 02/14/2018 1119   MCV 84 10/22/2017 1236   MCH 27.6 02/14/2018 1119   MCHC 33.4 02/14/2018 1119   RDW 12.9 02/14/2018 1119   RDW 13.5 10/22/2017 1236   LYMPHSABS 1.7 10/22/2017 1236   EOSABS 0.2 10/22/2017 1236   BASOSABS 0.0 10/22/2017 1236   Iron/TIBC/Ferritin/ %Sat No results found for: IRON, TIBC, FERRITIN, IRONPCTSAT Lipid Panel     Component Value Date/Time   CHOL 179 03/05/2018 1023   TRIG 142 03/05/2018 1023   HDL 52 03/05/2018 1023   LDLCALC 99 03/05/2018 1023   Hepatic Function Panel     Component Value Date/Time   PROT 6.8 02/14/2018 1119   PROT 6.8 10/22/2017 1236   ALBUMIN 4.1 10/22/2017 1236   AST 12 02/14/2018 1119   ALT 12 02/14/2018 1119   ALKPHOS 61 10/22/2017 1236   BILITOT 0.6 02/14/2018 1119  BILITOT 0.5 10/22/2017 1236      Component Value Date/Time   TSH 0.96 02/14/2018 1119   TSH 2.950 10/22/2017 1236   TSH 1.94 07/11/2017 1204   Results for DANAIYA, STEADMAN (MRN [id number]) as of 04/10/2018 15:57  Ref. Range 03/05/2018 10:23  Vitamin D, 25-Hydroxy Latest Ref Range: 30.0 - 100.0 ng/mL 34.4   ASSESSMENT AND PLAN: Vitamin D  deficiency - Plan: Vitamin D, Ergocalciferol, (DRISDOL) 50000 units CAPS capsule  Other depression - with emotional eating  At risk for osteoporosis  Class 3 severe obesity with serious comorbidity and body mass index (BMI) of 50.0 to 59.9 in adult, unspecified obesity type (HCC)  PLAN:  Vitamin D Deficiency Michaela Holland was informed that low vitamin D levels contributes to fatigue and are associated with obesity, breast, and colon cancer. She agrees to continue to take prescription Vit D @50 ,000 IU every week #4 with no refills and will follow up for routine testing of vitamin D, at least 2-3 times per year. She was informed of the risk of over-replacement of vitamin D and agrees to not increase her dose unless she discusses this with [place] first. Michaela Holland agrees to follow up as directed.  At risk for osteopenia and osteoporosis Michaela Holland is at risk for osteopenia and osteoporosis due to her vitamin D deficiency. She was encouraged to take her vitamin D and follow her higher calcium diet and increase strengthening exercise to help strengthen her bones and decrease her risk of osteopenia and osteoporosis.  Depression with Emotional Eating Behaviors We discussed behavior modification techniques today to help Michaela Holland deal with her emotional eating and depression. Michaela Holland is to see Dr. Dewaine Conger our bariatric psychologist. Michaela Holland will follow up with our clinic at the agreed upon time..  Obesity Michaela Holland is currently in the action stage of change. As such, her goal is to continue with weight loss efforts She has agreed to follow the Category 3 plan Michaela Holland has been instructed to work up to a goal of 150 minutes of combined cardio and strengthening exercise per week for weight loss and overall health benefits. We discussed the following Behavioral Modification Strategies today: increasing lean protein intake and decreasing simple carbohydrates   Michaela Holland has agreed to follow up with our clinic in 2 weeks. She was informed of the  importance of frequent follow up visits to maximize her success with intensive lifestyle modifications for her multiple health conditions.   OBESITY BEHAVIORAL INTERVENTION VISIT  Today's visit was # 10 out of 22.  Starting weight: 313 lbs Starting date: 10/22/17 Today's weight : 313 lbs Today's date: 87/31/19 Total lbs lost to date: 0    ASK: We discussed the diagnosis of obesity with Michaela Holland today and Michaela Holland agreed to give [place] permission to discuss obesity behavioral modification therapy today.  ASSESS: Michaela Holland has the diagnosis of obesity and her BMI today is 53.7 Michaela Holland is in the action stage of change   ADVISE: Michaela Holland was educated on the multiple health risks of obesity as well as the benefit of weight loss to improve her health. She was advised of the need for long term treatment and the importance of lifestyle modifications.  AGREE: Multiple dietary modification options and treatment options were discussed and  Michaela Holland agreed to the above obesity treatment plan.  I, [place] Crane, am acting as transcriptionist for Quillian Quince, MD  I have reviewed the above documentation for accuracy and completeness, and I agree with the above. -Quillian Quince, MD

## 2018-04-11 ENCOUNTER — Other Ambulatory Visit (HOSPITAL_COMMUNITY): Payer: Self-pay | Admitting: Surgery

## 2018-04-14 ENCOUNTER — Ambulatory Visit (INDEPENDENT_AMBULATORY_CARE_PROVIDER_SITE_OTHER): Payer: Self-pay | Admitting: Psychology

## 2018-04-14 ENCOUNTER — Ambulatory Visit (HOSPITAL_COMMUNITY): Payer: Self-pay | Admitting: Licensed Clinical Social Worker

## 2018-04-14 DIAGNOSIS — F3289 Other specified depressive episodes: Secondary | ICD-10-CM

## 2018-04-16 NOTE — Progress Notes (Addendum)
Office: [phone]  /  Fax: [phone]   Date: April 30, 2018 Time Seen: 11:03am Duration: 35 minutes Provider: Lawerance CruelGaytri Loletha Bertini, Psy.D. Type of Session: Individual Therapy   HPI: Michaela Holland was referred by Alois Clicheracey Aguilar PA-C for depression with emotional eating behaviors. Per the note for the visit with Michaela Pettiesracey Aguilar PA-C on March 25, 2018, "Annereports stress eating with husband's recent surgery. Chistine struggleswith emotional eating and using food for comfort to the extent that it is negatively impacting herhealth. Sheoften snacks when sheis not hungry. Annesometimes feels sheis out of control and then feels guilty that shemade poor food choices. Michaela NielsenShehas been working on behavior modification techniques to help reduce heremotional eating and has been somewhat successful.Sheshows no sign of suicidal or homicidal ideations." Per the note for the initial visit with this provider on April 14, 2018, Michaela Holland reported she eats for "many different reasons." She explained she eats for hunger, and various emotions. Michaela Holland noted she does not eat to the point of being sick. She denied starving herself and purging. She described increased awareness recently related to her eating patterns. Michaela Holland noted, "I'm looking into weight loss of surgery." Michaela Holland was asked to complete a questionnaire assessing various behaviors related to emotional eating. Michaela Holland endorsed the following: overeat when you are celebrating; experience food cravings on a regular basis; eat certain foods when you are anxious, stressed, depressed, or your feelings are hurt; use food to help you cope with emotional situations; find food is comforting to you; overeat when you are angry or upset; overeat when you are worried about something; overeat frequently when you are bored or lonely; not worry about what you eat when you are in a good mood; overeat when you are alone, but eat much less when you are with other people; eat to help you stay awake; and eat  as a reward. Moreover, per the note for the initial visit with Dr. Debbra RidingAlexandria Holland on October 22, 2017, Annehas been heavy most of herlife and shestarted gaining weight in high school. Herheaviest weight ever was 314pounds. Michaela Holland also reported the following at her initial appointment: significant food cravings issues; snacking frequently in the evenings; skipping meals frequently; frequently drinking liquids with calories; frequently making poor food choices; frequently eating larger portions than normal; and struggling with emotional eating. 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 She feels she got "addicted" to eating pizza and bakery items there as she was allowed to eat those items while she was working. Once she started college, Michaela Holland shared she was on her own and her "helping sizes were large."   Session Content: Session focused on the following treatment goal: decrease emotional eating. The session was initiated with the administration of the PHQ-9 and GAD-7, as well as a brief check-in. Michaela Holland shared she has been feeling "hopeful" since she had a job interview. In addition, she shared the side effect of heightened anxiety from her prescription of Rexulti has subsided. When she experiences anxiety, Michaela Holland indicated she engages in deep breathing, prays, communicates with her husband, and spends time alone. Regarding eating habits, Michaela Holland indicated it is "not the best." Nevertheless, she discussed utilizing the handout outlining physical versus emotional hunger from the lat appointment as a "reference," which has "helped" her make "better decisions." Moreover, she indicated she is doing "better" with the meal plan as well. Session then focused on Metta's plan to pursue bariatric surgery. She indicated  she completed a bariatric evaluation with Marlet Korte. Dareen Piano, Psy.D. at [facility]. Nolah explained Dr. Dareen Piano has "cleared" her for surgery, but she has a follow up appointment with Dr. Dareen Piano to discuss the results of the evaluation. Additionally, she shared she has an appointment with a nutritionist later today. Furthermore, Michaela Hole stated Dr. Dareen Piano requested a letter from this provider stating Michaela Holland is being seen by a psychologist. Per Michaela Hole, Dr. Dareen Piano also wanted this provider to include in the letter Michaela Holland is medically cleared for surgery. This provider explained to Michaela Holland that she would not be able to write a letter indicating she is medically or psychologically cleared for bariatric surgery as this provider has not evaluated Michaela Hole for that purpose. Anwitha was receptive to this provider writing a letter to share with Dr. Dareen Piano indicating the dates of office visits to date, the next scheduled appointment, and the goal of treatment. As such, Michaela Holland signed an Authorization form for herself in order for this provider to give her a letter regarding her treatment as explained above. Michaela Holland also expressed desire to meet with this provider before and after her bariatric surgery for support. The provider re-iterated her role as explained during the initial appointment. Notably, Michaela Holland was receptive to this provider consulting with the Orthoptist and then attending the initial part of her appointment today with Alicia Amel, PA-C to provide an update regarding her requests for the letter and continued therapy. Session was concluded with this provider giving Michaela Holland a handout regarding triggers for emotional eating. She was encouraged to utilize the handout between now and the next appointment to increase awareness of triggers and frequency. Michaela Holland agreed.   Michaela Holland was receptive to today's session as evidenced by sharing about recent events and willingness to utilize provided handouts to increase awareness of hunger patterns and subsequent emotional eating.    Mental  Status Examination: Khristian arrived late for the appointment. She presented as appropriately dressed and groomed. Comfort appeared her stated age and demonstrated adequate orientation to time, place, person, and purpose of the appointment. She also demonstrated appropriate eye contact. No psychomotor abnormalities or behavioral peculiarities noted. Her mood was euthymic with congruent affect. Her thought processes were logical, linear, and goal-directed. No hallucinations, delusions, bizarre thinking or behavior reported or observed. Judgment, insight, and impulse control appeared to be grossly intact. There was no evidence of paraphasias (i.e., errors in speech, gross mispronunciations, and word substitutions), repetition deficits, or disturbances in volume or prosody (i.e., rhythm and intonation). There was no evidence of attention or memory impairments. Trinda denied current suicidal and homicidal ideation, intent or plan.  Structured Assessment Results: The Patient Health Questionnaire-9 (PHQ-9) is a self-report measure that assesses symptoms and severity of depression over the course of the last two weeks. Marlaysia obtained a score of five suggesting mild depression. Quinisha finds the endorsed symptoms to be somewhat difficult. Depression screen [facility] 2/9 04/30/2018  Decreased Interest 0  Down, Depressed, Hopeless 1  PHQ - 2 Score 1  Altered sleeping 0  Tired, decreased energy 1  Change in appetite 2  Feeling bad or failure about yourself  1  Trouble concentrating 0  Moving slowly or fidgety/restless 0  Suicidal thoughts 0  PHQ-9 Score 5  Difficult doing work/chores -   The Generalized Anxiety Disorder-7 (GAD-7) is a brief self-report measure that assesses symptoms of anxiety over the course of the last two weeks. Kasee obtained a score of five suggesting mild anxiety. GAD 7 : Generalized  Anxiety Score 04/30/2018  Nervous, Anxious, on Edge 1  Control/stop worrying 1  Worry too much - different things 1    Trouble relaxing 1  Restless 0  Easily annoyed or irritable 1  Afraid - awful might happen 0  Total GAD 7 Score 5  Anxiety Difficulty Somewhat difficult   Interventions: Makaylen was administered the PHQ-9 and GAD-7 for symptom monitoring. Content (i.e., physical versus emotional hunger) from the last session was reviewed. Throughout today's session, empathic reflections and validation were provided. Brief psychoeducation regarding triggers for emotional eating was provided. A handout was given to [facility].   DSM-5 Diagnosis: 311 (F32.8) Other Specified Depressive Disorder, With Anxious Distress and Emotional Eating Behaviors  Plan: This provider consulted with the practice's medical director, Dr. Quillian Quince, regarding Chrisma's request for the letter and continued treatment. It was recommended the letter include the information as noted above and Dorotha would benefit from a referral for longer-term therapeutic services to provide her support before and after bariatric surgery. This provider joined Programmer, applications for the initial portion of her appointment with Alois Cliche, PA-C. She was informed a letter will be completed as discussed, and Trixie requested she be contacted once the letter is completed as she would like to acquire it as soon as possible to share with Dr. Dareen Piano. It was also recommended a referral be placed for longer-term services. Lunell agreed. Additionally, this provider discussed meeting with Michaela Hole for one additional appointment for termination. Zeba was receptive to this as evidenced by her stating, "That would make me feel better knowing I can see someone until I am scheduled."    Lark continues to appear able and willing to participate as evidenced by engagement in reciprocal conversation, and asking questions for clarification as appropriate. The next appointment will be scheduled in two weeks. The next session will focus on reviewing learned skills, and termination. This provider will also  check-in on the status of the referral placed.   AddendumThurston Hole came to pick up the letter from the office shortly after she was informed it was ready. This provider reviewed the letter with Michaela Hole, and explained Yianna would be responsible at it relates to the confidentiality of the letter once she leaves the office. She acknowledged understanding, and the letter was given to her in an envelope. Additionally, she was informed a referral was placed.

## 2018-04-17 ENCOUNTER — Ambulatory Visit (INDEPENDENT_AMBULATORY_CARE_PROVIDER_SITE_OTHER): Payer: 59 | Admitting: Psychiatry

## 2018-04-17 DIAGNOSIS — F509 Eating disorder, unspecified: Secondary | ICD-10-CM | POA: Diagnosis not present

## 2018-04-24 ENCOUNTER — Ambulatory Visit (INDEPENDENT_AMBULATORY_CARE_PROVIDER_SITE_OTHER): Payer: Self-pay | Admitting: Physician Assistant

## 2018-04-24 ENCOUNTER — Ambulatory Visit (INDEPENDENT_AMBULATORY_CARE_PROVIDER_SITE_OTHER): Payer: 59 | Admitting: Neurology

## 2018-04-24 ENCOUNTER — Encounter: Payer: Self-pay | Admitting: Neurology

## 2018-04-24 ENCOUNTER — Ambulatory Visit (INDEPENDENT_AMBULATORY_CARE_PROVIDER_SITE_OTHER): Payer: 59 | Admitting: Orthopaedic Surgery

## 2018-04-24 VITALS — BP 132/71 | HR 89 | Ht 64.0 in | Wt 317.0 lb

## 2018-04-24 DIAGNOSIS — Z87448 Personal history of other diseases of urinary system: Secondary | ICD-10-CM

## 2018-04-24 DIAGNOSIS — F419 Anxiety disorder, unspecified: Secondary | ICD-10-CM

## 2018-04-24 DIAGNOSIS — G4719 Other hypersomnia: Secondary | ICD-10-CM | POA: Diagnosis not present

## 2018-04-24 DIAGNOSIS — R635 Abnormal weight gain: Secondary | ICD-10-CM | POA: Diagnosis not present

## 2018-04-24 DIAGNOSIS — G4733 Obstructive sleep apnea (adult) (pediatric): Secondary | ICD-10-CM

## 2018-04-24 DIAGNOSIS — Z6841 Body Mass Index (BMI) 40.0 and over, adult: Secondary | ICD-10-CM

## 2018-04-24 DIAGNOSIS — R351 Nocturia: Secondary | ICD-10-CM

## 2018-04-24 DIAGNOSIS — F32A Depression, unspecified: Secondary | ICD-10-CM

## 2018-04-24 DIAGNOSIS — R9401 Abnormal electroencephalogram [EEG]: Secondary | ICD-10-CM

## 2018-04-24 DIAGNOSIS — F329 Major depressive disorder, single episode, unspecified: Secondary | ICD-10-CM

## 2018-04-24 NOTE — Patient Instructions (Signed)
Thank you for choosing [facility] for your sleep related care! It was nice to meet you today! I appreciate that you entrust me with your sleep related healthcare concerns. I hope, I was able to address at least some of your concerns today, and that I can help you feel reassured and also get better.    Here is what we discussed today and what we came up with as our plan for you:    Based on your symptoms and your exam I believe you are still at risk for obstructive sleep apnea and would benefit from reevaluation as it has been some time and your have had weight change. Therefore, I think we should proceed with a sleep study to determine how severe your sleep apnea is. If you have more than mild OSA, I want you to consider ongoing treatment with CPAP. Please remember, the risks and ramifications of moderate to severe obstructive sleep apnea or OSA are: Cardiovascular disease, including congestive heart failure, stroke, difficult to control hypertension, arrhythmias, and even type 2 diabetes has been linked to untreated OSA. Sleep apnea causes disruption of sleep and sleep deprivation in most cases, which, in turn, can cause recurrent headaches, problems with memory, mood, concentration, focus, and vigilance. Most people with untreated sleep apnea report excessive daytime sleepiness, which can affect their ability to drive. Please do not drive if you feel sleepy.   I will likely see you back after your sleep study to go over the test results and where to go from there. We will call you after your sleep study to advise about the results (most likely, you will hear from Elk PlainKristen, my nurse) and to set up an appointment at the time, as necessary.    Our sleep lab administrative assistant will call you to schedule your sleep study. If you don't hear back from her by about 2 weeks from now, please feel free to call her at [phone]. You can leave a message with your phone number and concerns,  if you get the voicemail box. She will call back as soon as possible.

## 2018-04-24 NOTE — Progress Notes (Signed)
Subjective:    Patient ID: Michaela Holland is a 37 y.o. female.  HPI     Interim history:   Dear Michaela Bergamoharley,   I saw your patient, Michaela Holland, upon your kind request in my neurologic clinic today for initial consultation of her nocturnal enuresis episodes and nocturia. The patient is unaccompanied today. As you know, Michaela Holland is a 37 year old right-handed woman with an underlying medical history of obstructive sleep apnea, vitamin D deficiency, seasonal allergies, PCO S, osteoarthritis, hypothyroidism, morbid obesity with a BMI of over 50, hypertension, reflux disease, gout, diabetes, depression, anxiety, and back pain, who presents for reevaluation of her obstructive sleep apnea. I have seen her for sleep consultation last year. She decided not to pursue CPAP therapy at the time. Unfortunately, since then she has gained quite a bit of weight. She had to have left knee surgery, she had decreased mobility from that. She also had some medication changes which perhaps contributed to weight gain. She has gained about 30 pounds in the recent past. I reviewed your office note from 03/28/2018. Her sleep study in April 2018 indicated overall mild sleep apnea, moderate during supine sleep. She is currently on gabapentin for residual knee pain. She is also on Celexa, clonazepam as needed for anxiety, and Rexulti. She is sleepy during the day. Her Epworth sleepiness score is 8 out of 24, fatigue score is 46 out of 63. She also reports that in the past maybe about 10 years ago she had an extended EEG and was told that she had some abnormality, she recalls the term pre-seizures. She was not treated for epilepsy. Nevertheless, she was advised to start gabapentin. She was not able to tolerate it at the time and stopped it. She does not have any history of staring spells or convulsions or history of epilepsy in the past. She does have a Holland-standing history since childhood of enuresis. She has nocturia about once per  average night. Sometimes she sets an alarm to go to the bathroom more her husband may wake her up so she can go to the bathroom at night. She is pursuing bariatric surgery. She is hoping for a surgery date in October 2019.  The patient's allergies, current medications, family history, past medical history, past social history, past surgical history and problem list were reviewed and updated as appropriate.   Previously (copied from previous notes for reference):   05/29/2017: Michaela Holland is a 37 year old right-handed woman with an underlying medical history of hypothyroidism, PCOS, anxiety, left knee pain, reflux disease, and morbid obesity with a BMI of over 50, who was recently diagnosed with obstructive sleep apnea. She had a sleep study out of state in [place]. I reviewed her sleep study results from 12/10/2016. Sleep efficiency was 86%, sleep latency 9.5 minutes, REM percentage 19%. Total AHI was 7 per hour, supine AHI 20.6 per hour, average oxygen saturation 94%, nadir was 89%. She was noted to snore mildly. PLM index was 58 per hour with an associated arousal index of 15.4 per hour. I reviewed your office note from 04/09/2017. Her Epworth sleepiness score is 8 out of 24, fatigue score is 44 out of 63. She works at [employer]. She lives with her husband, they have 1 child. She is a nonsmoker, does not drink alcohol on a regular basis, does not use illicit drugs and drinks caffeine in the form of coffee, 1-2 cups a day. She had restless leg syndrome quite significantly during her pregnancy and was also  diagnosed with iron deficiency at the time. She is not aware of her leg movements at night but did have severe PLMS during her sleep study interestingly in April of this year. 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 She has 67-year-old daughter and her husband has a flexible schedule thankfully. Bedtime is around 11 and wake up time during workdays is 5:15 AM, otherwise 7 or 7:30 AM. She does not have night  to night nocturia. She does not typically have morning headaches. She does not have a family history of sleep apnea but suspects that her father may have it. Her original complaint that prompted sleep study testing was daytime somnolence and nonrestorative sleep.    Her Past Medical History Is Significant For: Past Medical History:  Diagnosis Date  . Anxiety   . Back pain   . Chronic pain of left knee   . Depression   . Diabetes mellitus without complication (HCC)    denies this diagnosis   . Family history of adverse reaction to anesthesia    per patient ; father was having abdominal surgery and had to be placed on medicine to keep his blood pressure up; he never had any issues with low BP before the surgery "   . Gallbladder problem   . GERD (gastroesophageal reflux disease)   . Gout    believes only occurred once in feet;   . Hypertension    denies this diagnosis   . Hypothyroidism   . Joint pain   . Obesity   . Osteoarthritis   . Palpitations   . PCOS (polycystic ovarian syndrome)   . PONV (postoperative nausea and vomiting)    with c section; no issues with followwing procedures   . Seasonal allergies   . Seizures (HCC)    has had "pre-seizure" activity in the brain but deneis any actual seizures   . Sleep apnea    does not currently use due to insurance   . Vitamin D deficiency     Her Past Surgical History Is Significant For: Past Surgical History:  Procedure Laterality Date  . CESAREAN SECTION    . CHOLECYSTECTOMY    . ESOPHAGOGASTRODUODENOSCOPY  2001 and 2018  . GANGLION CYST EXCISION Left 2014   foot  . KNEE ARTHROSCOPY Left 06/28/2017   Procedure: LEFT KNEE ARTHROSCOPY with debridement;  Surgeon: Kathryne Hitch, MD;  Location: [facility];  Service: Orthopedics;  Laterality: Left;    Her Family History Is Significant For: Family History  Problem Relation Age of Onset  . Polycystic ovary syndrome Mother   . Hypothyroidism Mother   . Hyperlipidemia  Mother   . Depression Mother   . Obesity Mother   . Polycystic ovary syndrome Sister   . Hypothyroidism Sister   . Hypertension Father   . Hyperlipidemia Father   . Cancer Father   . Depression Father   . Anxiety disorder Father   . Obesity Father     Her Social History Is Significant For: Social History   Socioeconomic History  . Marital status: Married    Spouse name: Michaela Holland  . Number of children: 1  . Years of education: Not on file  . Highest education level: Not on file  Occupational History  . Occupation: Designer, jewellery  Social Needs  . Financial resource strain: Not on file  . Food insecurity:    Worry: Not on file    Inability: Not on file  . Transportation needs:  Medical: Not on file    Non-medical: Not on file  Tobacco Use  . Smoking status: Never Smoker  . Smokeless tobacco: Never Used  Substance and Sexual Activity  . Alcohol use: Never    Frequency: Never  . Drug use: No  . Sexual activity: Yes    Partners: Male    Birth control/protection: None  Lifestyle  . Physical activity:    Days per week: Not on file    Minutes per session: Not on file  . Stress: Not on file  Relationships  . Social connections:    Talks on phone: Not on file    Gets together: Not on file    Attends religious service: Not on file    Active member of club or organization: Not on file    Attends meetings of clubs or organizations: Not on file    Relationship status: Not on file  Other Topics Concern  . Not on file  Social History Narrative  . Not on file    Her Allergies Are:  Allergies  Allergen Reactions  . Sulfa Antibiotics Hives and Shortness Of Breath  . Food     Allergy to walnuts, pecans, [place] nuts, hazel nuts (she can eat other nuts).  . Other     Nuts severe reaction difficulty breathing , throat swelling; mostly tree nuts but not all tree nuts   :   Her Current Medications Are:  Outpatient Encounter Medications as of 04/24/2018  Medication  Sig  . acetaminophen-codeine (TYLENOL #3) 300-30 MG tablet Take 1-2 tablets by mouth every 8 (eight) hours as needed for moderate pain.  Marland Kitchen. albuterol (PROVENTIL HFA;VENTOLIN HFA) 108 (90 Base) MCG/ACT inhaler Inhale 1-2 puffs into the lungs every 4 (four) hours as needed for wheezing or shortness of breath (bronchospasm).  Vedia Coffer. Black Pepper-Turmeric (TURMERIC CURCUMIN) 01-999 MG CAPS Take 1 capsule by mouth daily.  . Brexpiprazole (REXULTI) 1 MG TABS Take 1 tablet (1 mg total) by mouth every morning.  . citalopram (CELEXA) 40 MG tablet Take 1 tablet (40 mg total) by mouth daily.  . clonazePAM (KLONOPIN) 0.5 MG tablet Take 0.5-1 tablets (0.25-0.5 mg total) by mouth 2 (two) times daily as needed for anxiety (#30 for 90 days.).  Marland Kitchen. Cyanocobalamin 2000 MCG TBCR Take 1 tablet (2,000 mcg total) by mouth daily.  . cyclobenzaprine (FLEXERIL) 10 MG tablet Take by mouth.  . diclofenac (VOLTAREN) 75 MG EC tablet TAKE 1 TABLET BY MOUTH 2 TIMES DAILY.  Marland Kitchen. EPINEPHrine 0.3 mg/0.3 mL IJ SOAJ injection Inject into the muscle.  . fluconazole (DIFLUCAN) 150 MG tablet   . gabapentin (NEURONTIN) 300 MG capsule One tab PO qHS for a week, then BID for a week, then TID. May double weekly to a max of 3,600mg /day  . Glucosamine-Chondroit-Vit C-Mn (GLUCOSAMINE CHONDR 1500 COMPLX PO) Take 1 capsule by mouth daily.  Marland Kitchen. ibuprofen (ADVIL,MOTRIN) 200 MG tablet Take 800 mg by mouth every 8 (eight) hours as needed (for pain.).  Marland Kitchen. levothyroxine (SYNTHROID, LEVOTHROID) 175 MCG tablet Take 1 tablet (175 mcg total) by mouth daily before breakfast.  . metFORMIN (GLUCOPHAGE-XR) 500 MG 24 hr tablet Take 1 tablet (500 mg total) by mouth at bedtime. (Patient taking differently: Take 1,000 mg by mouth at bedtime. )  . mirabegron ER (MYRBETRIQ) 25 MG TB24 tablet Take 1 tablet (25 mg total) by mouth daily.  Marland Kitchen. Nystatin POWD Apply liberally to affected area 2 times per day  . omeprazole (PRILOSEC) 40 MG capsule Take 1 capsule (  40 mg total) by mouth  daily.  . Prenatal MV-Min-FA-Omega-3 (PRENATAL GUMMIES/DHA & FA) 0.4-32.5 MG CHEW Chew by mouth.  . ranitidine (ZANTAC) 150 MG tablet Take 150 mg by mouth daily as needed for heartburn.   . Vitamin D, Ergocalciferol, (DRISDOL) 50000 units CAPS capsule Take 1 capsule (50,000 Units total) by mouth every 7 (seven) days.   No facility-administered encounter medications on file as of 04/24/2018.   :  Review of Systems:  Out of a complete 14 point review of systems, all are reviewed and negative with the exception of these symptoms as listed below:  Review of Systems  Neurological:       Pt presents today to discuss her sleep. Pt has had a sleep study about 1.5 years ago and opted out of starting a cpap at that time. Pt needs to revisit the need of a cpap prior to her bariatric surgery.  Epworth Sleepiness Scale 0= would never doze 1= slight chance of dozing 2= moderate chance of dozing 3= high chance of dozing  Sitting and reading: 1 Watching TV: 1 Sitting inactive in a public place (ex. Theater or meeting): 0 As a passenger in a car for an hour without a break: 1 Lying down to rest in the afternoon: 3 Sitting and talking to someone: 0 Sitting quietly after lunch (no alcohol): 1 In a car, while stopped in traffic: 0 Total: 8     Objective:  Neurological Exam  Physical Exam Physical Examination:   Vitals:   04/24/18 1100  BP: 132/71  Pulse: 89    General Examination: The patient is a very pleasant 37 y.o. female in no acute distress. She appears well-developed and well-nourished and well groomed.   HEENT: Normocephalic, atraumatic, pupils are equal, round and reactive to light and accommodation. Extraocular tracking is good without limitation to gaze excursion or nystagmus noted. Normal smooth pursuit is noted. Hearing is grossly intact. Face is symmetric with normal facial animation and normal facial sensation. Speech is clear with no dysarthria noted. There is no hypophonia.  There is no lip, neck/head, jaw or voice tremor. Neck is supple with full range of passive and active motion. There are no carotid bruits on auscultation. Oropharynx exam reveals: mild mouth dryness, good dental hygiene and moderate airway crowding, due to smaller airway, larger uvula, tonsils of 1+ to 2+, L a little larger than R. Mallampati is class II. Tongue protrudes centrally and palate elevates symmetrically. Neck size is 15 1/2 inches.   Chest: Clear to auscultation without wheezing, rhonchi or crackles noted.  Heart: S1+S2+0, regular and normal without murmurs, rubs or gallops noted.   Abdomen: Soft, non-tender and non-distended with normal bowel sounds appreciated on auscultation.  Extremities: There is no pitting edema in the distal lower extremities bilaterally.   Skin: Warm and dry without trophic changes noted.  Musculoskeletal: exam reveals no obvious joint deformities, tenderness or joint swelling or erythema, with the exception of some discomfort left knee, also status post ganglion cyst removal L foot.   Neurologically:  Mental status: The patient is awake, alert and oriented in all 4 spheres. Her immediate and remote memory, attention, language skills and fund of knowledge are appropriate. There is no evidence of aphasia, agnosia, apraxia or anomia. Speech is clear with normal prosody and enunciation. Thought process is linear. Mood is normal and affect is normal.  Cranial nerves II - XII are as described above under HEENT exam. Motor exam: Normal bulk, strength and tone is noted.  There is no drift, tremor or rebound. Reflexes are 1+ throughout. Fine motor skills and coordination: intact grossly.  Cerebellar testing: No dysmetria or intention tremor. There is no truncal or gait ataxia.  Sensory exam: intact to light touch in the upper and lower extremities.  Gait, station and balance: She stands easily. No veering to one side is noted. No leaning to one side is noted.  Posture is age-appropriate and stance is narrow based. Gait shows normal stride length and normal pace. No problems turning are noted.   Assessment and Plan:  In summary, Michaela Holland is a very pleasant 37 year old female with an underlying medical history of hypothyroidism, PCO S, anxiety, left knee pain, reflux disease, and morbid obesity with a BMI of over 50, who  presents for follow-up consultation of her sleep disorder, she was diagnosed with mild to moderate obstructive sleep apnea in April 2018. Unfortunately, she has had significant interim weight gain of over 30 pounds. She had knee surgery. She had some medication changes as well. She has a history of abnormal EEG in the past, some 10 years ago. She recalls that she was in [place] at the time. She is advised that we can certainly revisit a full EEG in the near future but for now we mutually agreed to pursue her sleep issues first. She has a history of nocturnal enuresis, she has had enuresis since childhood. She also has a history of nocturia and her sleep-related complaints are snoring and daytime somnolence. There is a possibility that her sleep apnea has become worse secondary to weight gain. She is pursuing bariatric surgery as well and should be treated for sleep apnea prior to surgery. She is advised to pursue a split-night sleep study. She would be willing to try CPAP if the need arises. Her history is not suggestive of any epilepsy or convulsions or staring spells thankfully. I answered all her questions today and the patient was in agreement. I would like to see her back after the sleep study is completed and encouraged her to call with any interim questions, concerns, problems or updates.

## 2018-04-28 ENCOUNTER — Ambulatory Visit (HOSPITAL_COMMUNITY)
Admission: RE | Admit: 2018-04-28 | Discharge: 2018-04-28 | Disposition: A | Discharge status: Home / Self Care | Payer: 59 | Source: Ambulatory Visit | Attending: Surgery | Admitting: Surgery

## 2018-04-28 ENCOUNTER — Other Ambulatory Visit (INDEPENDENT_AMBULATORY_CARE_PROVIDER_SITE_OTHER): Payer: Self-pay | Admitting: Physician Assistant

## 2018-04-28 DIAGNOSIS — Z01818 Encounter for other preprocedural examination: Secondary | ICD-10-CM | POA: Insufficient documentation

## 2018-04-28 DIAGNOSIS — E559 Vitamin D deficiency, unspecified: Secondary | ICD-10-CM

## 2018-04-30 ENCOUNTER — Encounter (INDEPENDENT_AMBULATORY_CARE_PROVIDER_SITE_OTHER): Payer: Self-pay | Admitting: Psychology

## 2018-04-30 ENCOUNTER — Ambulatory Visit (INDEPENDENT_AMBULATORY_CARE_PROVIDER_SITE_OTHER): Payer: 59 | Admitting: Psychology

## 2018-04-30 ENCOUNTER — Encounter (INDEPENDENT_AMBULATORY_CARE_PROVIDER_SITE_OTHER): Payer: Self-pay

## 2018-04-30 ENCOUNTER — Ambulatory Visit (INDEPENDENT_AMBULATORY_CARE_PROVIDER_SITE_OTHER): Payer: 59 | Admitting: Physician Assistant

## 2018-04-30 ENCOUNTER — Encounter: Payer: Self-pay | Admitting: Registered"

## 2018-04-30 ENCOUNTER — Encounter: Payer: 59 | Attending: Surgery | Admitting: Registered"

## 2018-04-30 VITALS — BP 143/83 | HR 82 | Temp 98.0°F | Ht 64.0 in | Wt 319.0 lb

## 2018-04-30 DIAGNOSIS — E669 Obesity, unspecified: Secondary | ICD-10-CM | POA: Insufficient documentation

## 2018-04-30 DIAGNOSIS — Z6841 Body Mass Index (BMI) 40.0 and over, adult: Secondary | ICD-10-CM

## 2018-04-30 DIAGNOSIS — Z713 Dietary counseling and surveillance: Secondary | ICD-10-CM | POA: Insufficient documentation

## 2018-04-30 DIAGNOSIS — E559 Vitamin D deficiency, unspecified: Secondary | ICD-10-CM | POA: Diagnosis not present

## 2018-04-30 DIAGNOSIS — F3289 Other specified depressive episodes: Secondary | ICD-10-CM

## 2018-04-30 NOTE — Progress Notes (Signed)
Office: [phone]  /  Fax: [phone]   HPI:   Chief Complaint: OBESITY Michaela Holland is here to discuss her progress with her obesity treatment plan. She is on the Category 3 plan and is following her eating plan approximately 30 % of the time. She states she is swimming 45 minutes 3 to 4 times per week. Michaela Holland has struggled to follow the category 3 plan over the last few weeks. She reports a lack of meal planning. She does state that she has been having more salty food cravings. Her weight is (!) 319 lb (144.7 kg) today and has not lost weight since her last visit. She has lost 0 lbs since starting treatment with us.  Vitamin D deficiency Michaela Holland has a diagnosis of vitamin D deficiency. She is currently taking prescription vit D and denies nausea, vomiting or muscle weakness.  ALLERGIES: Allergies  Allergen Reactions  . Sulfa Antibiotics Hives and Shortness Of Breath  . Food     Allergy to walnuts, pecans, [place] nuts, hazel nuts (she can eat other nuts).  . Other     Nuts severe reaction difficulty breathing , throat swelling; mostly tree nuts but not all tree nuts     MEDICATIONS: Current Outpatient Medications on File Prior to Visit  Medication Sig Dispense Refill  . acetaminophen-codeine (TYLENOL #3) 300-30 MG tablet Take 1-2 tablets by mouth every 8 (eight) hours as needed for moderate pain. 50 tablet 0  . albuterol (PROVENTIL HFA;VENTOLIN HFA) 108 (90 Base) MCG/ACT inhaler Inhale 1-2 puffs into the lungs every 4 (four) hours as needed for wheezing or shortness of breath (bronchospasm). 1 Inhaler 0  . Black Pepper-Turmeric (TURMERIC CURCUMIN) 01-999 MG CAPS Take 1 capsule by mouth daily.    . Brexpiprazole (REXULTI) 1 MG TABS Take 1 tablet (1 mg total) by mouth every morning. 90 tablet 0  . citalopram (CELEXA) 40 MG tablet Take 1 tablet (40 mg total) by mouth daily. 90 tablet 0  . clonazePAM (KLONOPIN) 0.5 MG tablet Take 0.5-1 tablets (0.25-0.5 mg total) by mouth 2 (two) times daily  as needed for anxiety (#30 for 90 days.). 30 tablet 0  . Cyanocobalamin 2000 MCG TBCR Take 1 tablet (2,000 mcg total) by mouth daily. 30 tablet 0  . cyclobenzaprine (FLEXERIL) 10 MG tablet Take by mouth.    . diclofenac (VOLTAREN) 75 MG EC tablet TAKE 1 TABLET BY MOUTH 2 TIMES DAILY. 60 tablet 1  . EPINEPHrine 0.3 mg/0.3 mL IJ SOAJ injection Inject into the muscle.    . fluconazole (DIFLUCAN) 150 MG tablet     . gabapentin (NEURONTIN) 300 MG capsule One tab PO qHS for a week, then BID for a week, then TID. May double weekly to a max of 3,600mg /day 180 capsule 3  . Glucosamine-Chondroit-Vit C-Mn (GLUCOSAMINE CHONDR 1500 COMPLX PO) Take 1 capsule by mouth daily.    Marland Kitchen. ibuprofen (ADVIL,MOTRIN) 200 MG tablet Take 800 mg by mouth every 8 (eight) hours as needed (for pain.).    Marland Kitchen. levothyroxine (SYNTHROID, LEVOTHROID) 175 MCG tablet Take 1 tablet (175 mcg total) by mouth daily before breakfast. 90 tablet 3  . metFORMIN (GLUCOPHAGE-XR) 500 MG 24 hr tablet Take 1 tablet (500 mg total) by mouth at bedtime. (Patient taking differently: Take 1,000 mg by mouth at bedtime. ) 90 tablet 3  . mirabegron ER (MYRBETRIQ) 25 MG TB24 tablet Take 1 tablet (25 mg total) by mouth daily. 30 tablet 3  . Nystatin POWD Apply liberally to affected area 2 times  per day 1 Bottle 11  . omeprazole (PRILOSEC) 40 MG capsule Take 1 capsule (40 mg total) by mouth daily. 30 capsule 6  . Prenatal MV-Min-FA-Omega-3 (PRENATAL GUMMIES/DHA & FA) 0.4-32.5 MG CHEW Chew by mouth.    . ranitidine (ZANTAC) 150 MG tablet Take 150 mg by mouth daily as needed for heartburn.     . Vitamin D, Ergocalciferol, (DRISDOL) 50000 units CAPS capsule Take 1 capsule (50,000 Units total) by mouth every 7 (seven) days. 4 capsule 0   No current facility-administered medications on file prior to visit.     PAST MEDICAL HISTORY: Past Medical History:  Diagnosis Date  . Anxiety   . Back pain   . Chronic pain of left knee   . Depression   . Diabetes mellitus  without complication (HCC)    denies this diagnosis   . Family history of adverse reaction to anesthesia    per patient ; father was having abdominal surgery and had to be placed on medicine to keep his blood pressure up; he never had any issues with low BP before the surgery "   . Gallbladder problem   . GERD (gastroesophageal reflux disease)   . Gout    believes only occurred once in feet;   . Hypertension    denies this diagnosis   . Hypothyroidism   . Joint pain   . Obesity   . Osteoarthritis   . Palpitations   . PCOS (polycystic ovarian syndrome)   . PONV (postoperative nausea and vomiting)    with c section; no issues with followwing procedures   . Seasonal allergies   . Seizures (HCC)    has had "pre-seizure" activity in the brain but deneis any actual seizures   . Sleep apnea    does not currently use due to insurance   . Vitamin D deficiency     PAST SURGICAL HISTORY: Past Surgical History:  Procedure Laterality Date  . CESAREAN SECTION    . CHOLECYSTECTOMY    . ESOPHAGOGASTRODUODENOSCOPY  2001 and 2018  . GANGLION CYST EXCISION Left 2014   foot  . KNEE ARTHROSCOPY Left 06/28/2017   Procedure: LEFT KNEE ARTHROSCOPY with debridement;  Surgeon: Kathryne Hitch, MD;  Location: [facility];  Service: Orthopedics;  Laterality: Left;    SOCIAL HISTORY: Social History   Tobacco Use  . Smoking status: Never Smoker  . Smokeless tobacco: Never Used  Substance Use Topics  . Alcohol use: Never    Frequency: Never  . Drug use: No    FAMILY HISTORY: Family History  Problem Relation Age of Onset  . Polycystic ovary syndrome Mother   . Hypothyroidism Mother   . Hyperlipidemia Mother   . Depression Mother   . Obesity Mother   . Polycystic ovary syndrome Sister   . Hypothyroidism Sister   . Hypertension Father   . Hyperlipidemia Father   . Cancer Father   . Depression Father   . Anxiety disorder Father   . Obesity Father   . Asthma Other     ROS: Review  of Systems  Constitutional: Negative for weight loss.  Gastrointestinal: Negative for nausea and vomiting.  Musculoskeletal:       Negative for muscle weakness    PHYSICAL EXAM: Blood pressure (!) 143/83, pulse 82, temperature 98 F (36.7 C), height 5\' 4"  (1.626 m), weight (!) 319 lb (144.7 kg), SpO2 97 %. Body mass index is 54.76 kg/m. Physical Exam  Constitutional: She is oriented to person, place, and  time. She appears well-developed and well-nourished.  Cardiovascular: Normal rate.  Pulmonary/Chest: Effort normal.  Musculoskeletal: Normal range of motion.  Neurological: She is oriented to person, place, and time.  Skin: Skin is warm and dry.  Psychiatric: She has a normal mood and affect. Her behavior is normal.  Vitals reviewed.   RECENT LABS AND TESTS: BMET    Component Value Date/Time   NA 137 02/14/2018 1119   NA 138 10/22/2017 1236   K 4.4 02/14/2018 1119   CL 103 02/14/2018 1119   CO2 26 02/14/2018 1119   GLUCOSE 86 02/14/2018 1119   BUN 14 02/14/2018 1119   BUN 10 10/22/2017 1236   CREATININE 0.69 02/14/2018 1119   CALCIUM 9.3 02/14/2018 1119   GFRNONAA 112 02/14/2018 1119   GFRAA 130 02/14/2018 1119   Lab Results  Component Value Date   HGBA1C 4.9 10/22/2017   HGBA1C 5.1 07/11/2017   Lab Results  Component Value Date   INSULIN 8.8 10/22/2017   CBC    Component Value Date/Time   WBC 6.9 02/14/2018 1119   RBC 4.63 02/14/2018 1119   HGB 12.8 02/14/2018 1119   HGB 12.8 10/22/2017 1236   HCT 38.3 02/14/2018 1119   HCT 37.8 10/22/2017 1236   PLT 349 02/14/2018 1119   MCV 82.7 02/14/2018 1119   MCV 84 10/22/2017 1236   MCH 27.6 02/14/2018 1119   MCHC 33.4 02/14/2018 1119   RDW 12.9 02/14/2018 1119   RDW 13.5 10/22/2017 1236   LYMPHSABS 1.7 10/22/2017 1236   EOSABS 0.2 10/22/2017 1236   BASOSABS 0.0 10/22/2017 1236   Iron/TIBC/Ferritin/ %Sat No results found for: IRON, TIBC, FERRITIN, IRONPCTSAT Lipid Panel     Component Value Date/Time    CHOL 179 03/05/2018 1023   TRIG 142 03/05/2018 1023   HDL 52 03/05/2018 1023   LDLCALC 99 03/05/2018 1023   Hepatic Function Panel     Component Value Date/Time   PROT 6.8 02/14/2018 1119   PROT 6.8 10/22/2017 1236   ALBUMIN 4.1 10/22/2017 1236   AST 12 02/14/2018 1119   ALT 12 02/14/2018 1119   ALKPHOS 61 10/22/2017 1236   BILITOT 0.6 02/14/2018 1119   BILITOT 0.5 10/22/2017 1236      Component Value Date/Time   TSH 0.96 02/14/2018 1119   TSH 2.950 10/22/2017 1236   TSH 1.94 07/11/2017 1204   Results for Garald BaldingHANKS, Kamy K (MRN [id number]) as of 04/30/2018 15:14  Ref. Range 03/05/2018 10:23  Vitamin D, 25-Hydroxy Latest Ref Range: 30.0 - 100.0 ng/mL 34.4   ASSESSMENT AND PLAN: Vitamin D deficiency  Class 3 severe obesity with serious comorbidity and body mass index (BMI) of 50.0 to 59.9 in adult, unspecified obesity type (HCC)  PLAN:  Vitamin D Deficiency Michaela Holland was informed that low vitamin D levels contributes to fatigue and are associated with obesity, breast, and colon cancer. She agrees to continue to take prescription Vit D @50 ,000 IU every week and will follow up for routine testing of vitamin D, at least 2-3 times per year. She was informed of the risk of over-replacement of vitamin D and agrees to not increase her dose unless she discusses this with us first.  We spent > than 50% of the 15 minute visit on the counseling as documented in the note.  Obesity Michaela Holland is currently in the action stage of change. As such, her goal is to continue with weight loss efforts She has agreed to follow the Category 3 plan +300 calories Michaela Holland  has been instructed to work up to a goal of 150 minutes of combined cardio and strengthening exercise per week for weight loss and overall health benefits. We discussed the following Behavioral Modification Strategies today: better snacking choices and work on meal planning and easy cooking plans  Michaela Holland has agreed to follow up with our clinic in 2  weeks. She was informed of the importance of frequent follow up visits to maximize her success with intensive lifestyle modifications for her multiple health conditions.   OBESITY BEHAVIORAL INTERVENTION VISIT  Today's visit was # 11   Starting weight: 313 lbs Starting date: 10/22/17 Today's weight : 319 lbs Today's date: 04/30/2018 Total lbs lost to date: 0 At least 15 minutes were spent on discussing the following behavioral intervention visit.   ASK: We discussed the diagnosis of obesity with Michaela Holland today and Michaela Holland agreed to give [place] permission to discuss obesity behavioral modification therapy today.  ASSESS: Michaela Holland has the diagnosis of obesity and her BMI today is 54.73 Adysen is in the action stage of change   ADVISE: Michaela Holland was educated on the multiple health risks of obesity as well as the benefit of weight loss to improve her health. She was advised of the need for long term treatment and the importance of lifestyle modifications to improve her current health and to decrease her risk of future health problems.  AGREE: Multiple dietary modification options and treatment options were discussed and  Michaela Holland agreed to follow the recommendations documented in the above note.  ARRANGE: Michaela Holland was educated on the importance of frequent visits to treat obesity as outlined per CMS and USPSTF guidelines and agreed to schedule her next follow up appointment today.  Cristi Loron, am acting as transcriptionist for Alois Cliche, PA-C I, Alois Cliche, PA-C have reviewed above note and agree with its content

## 2018-04-30 NOTE — Progress Notes (Signed)
Pre-Op Assessment Visit:  Pre-Operative Sleeve Gastrectomy Surgery  Medical Nutrition Therapy:  Appt start time: 2:10  End time:  3:14  Patient was seen on 04/30/2018 for Pre-Operative Nutrition Assessment. [facility] on 04/30/2018.   Pt expectation of surgery: improve knee pain, reduce pressure off knee, improve quality of life, able to walk with family, increase energy  Pt expectation of Dietitian: encouragement, alternative ideas and options, accountability, research-based education  Start weight at [facility]: 322.5 BMI: 56.68   Pt states she sees a psychiatrist and psychologist. Pt states her husband has had bariatric surgery and very familiar with what it takes. 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 Pt states she is currently following a high protein, lower carb, and low fat diet.   Per insurance, pt needs 6 SWL visits prior to surgery. Pt states she has had visits since Feb 2019 and weight was yo-yoing. Pt needs education on Pre-op goals sheet at next appointment.    24 hr Dietary Recall: First Meal: sometimes skips; eggs + cheese + ham  Snack: none Second Meal: Chickfila-grilled chicken sandwich, fruit cup or frozen meal + yogurt + fruit Snack: none Third Meal: [place] food- sesame chicken, steamed rice, steamed broccoli Snack: none Beverages: diet lemonade, water with flavor packs, unsweetened tea, soda (diet and regular), coffee  Encouraged to engage in 75 minutes of moderate physical activity including cardiovascular and weight baring weekly  Handouts given during visit include:  . Pre-Op Goals . Bariatric Surgery Protein Shakes . Vitamin and Mineral Recommendations  During the appointment today the following Pre-Op Goals were reviewed with the patient: . Limited concentrated sugars and fried foods . Aim for 64-100 ounces of FLUID daily  . Aim for at least 60-80  grams of PROTEIN daily . Look for a liquid protein source that contain ?15 g protein and ?5 g carbohydrate  (ex: shakes, drinks, shots) . Physical activity is an important part of a healthy lifestyle so keep it moving!  Follow diet recommendations listed below Energy and Macronutrient Recommendations: Calories: 1800 Carbohydrate: 200 Protein: 135 Fat: 50  Demonstrated degree of understanding via:  Teach Back   Teaching Method Utilized:  Visual Auditory Hands on  Barriers to learning/adherence to lifestyle change: contemplative stage of change  Patient to call the Nutrition [facility] Education [facility] to enroll in Pre-Op and Post-Op Nutrition Education when surgery date is scheduled.

## 2018-04-30 NOTE — Progress Notes (Signed)
Letter completed to be picked up by patient.

## 2018-05-01 ENCOUNTER — Ambulatory Visit: Payer: 59 | Admitting: Psychiatry

## 2018-05-05 ENCOUNTER — Ambulatory Visit (INDEPENDENT_AMBULATORY_CARE_PROVIDER_SITE_OTHER): Payer: 59 | Admitting: Orthopaedic Surgery

## 2018-05-05 ENCOUNTER — Encounter (INDEPENDENT_AMBULATORY_CARE_PROVIDER_SITE_OTHER): Payer: Self-pay | Admitting: Orthopaedic Surgery

## 2018-05-05 ENCOUNTER — Other Ambulatory Visit: Payer: Self-pay | Admitting: Osteopathic Medicine

## 2018-05-05 ENCOUNTER — Other Ambulatory Visit (INDEPENDENT_AMBULATORY_CARE_PROVIDER_SITE_OTHER): Payer: Self-pay | Admitting: Physician Assistant

## 2018-05-05 DIAGNOSIS — G8929 Other chronic pain: Secondary | ICD-10-CM

## 2018-05-05 DIAGNOSIS — M1712 Unilateral primary osteoarthritis, left knee: Secondary | ICD-10-CM | POA: Diagnosis not present

## 2018-05-05 DIAGNOSIS — E559 Vitamin D deficiency, unspecified: Secondary | ICD-10-CM

## 2018-05-05 DIAGNOSIS — K2100 Gastro-esophageal reflux disease with esophagitis, without bleeding: Secondary | ICD-10-CM

## 2018-05-05 DIAGNOSIS — M25562 Pain in left knee: Secondary | ICD-10-CM | POA: Diagnosis not present

## 2018-05-05 NOTE — Progress Notes (Signed)
Patient is following up for her left knee.  She had Monovisc in his knee June 27.  That has not really helped her much.  The steroid injection to his before that did help more so.  She is someone who has a BMI of almost 55.  She is actually going to likely have gastric sleeve surgery in October.  On examination of her left knee both knees do hyperextend the left knee does have more patellofemoral crepitation and medial joint line tenderness with known arthritis in her knee.  There is no effusion today.  I do feel that eventually she would need an arthroscopic intervention in her young age with probably microfracture surgery.  More she is seeing how she does with weight loss surgery and how this may alleviate pain from her knee joint.  She understands this as well.  I would like to see her back though in just 4 weeks because it would be safe to place a steroid injection in her left knee exam and she agrees with this.  Infarction I will see her back for a left knee steroid injection.

## 2018-05-06 ENCOUNTER — Other Ambulatory Visit: Payer: Self-pay

## 2018-05-06 ENCOUNTER — Encounter: Payer: Self-pay | Admitting: *Deleted

## 2018-05-06 ENCOUNTER — Ambulatory Visit: Payer: Self-pay | Admitting: Family Medicine

## 2018-05-06 ENCOUNTER — Emergency Department (INDEPENDENT_AMBULATORY_CARE_PROVIDER_SITE_OTHER)
Admission: EM | Admit: 2018-05-06 | Discharge: 2018-05-06 | Disposition: A | Discharge status: Home / Self Care | Payer: 59 | Source: Home / Self Care | Attending: Family Medicine | Admitting: Family Medicine

## 2018-05-06 DIAGNOSIS — H8110 Benign paroxysmal vertigo, unspecified ear: Secondary | ICD-10-CM | POA: Diagnosis not present

## 2018-05-06 MED ORDER — MECLIZINE HCL 25 MG PO TABS: 25.0000 mg | ORAL_TABLET | Freq: Three times a day (TID) | ORAL | 1 refills | Status: DC | PRN

## 2018-05-06 MED FILL — OMEPRAZOLE 40 MG CPDR: 40 | 30 days supply | Qty: 30 | Fill #0

## 2018-05-06 MED FILL — VIT D2 1.25 MG (50,000 UNIT: 1.25 MG | 28 days supply | Qty: 4 | Fill #0

## 2018-05-06 NOTE — ED Triage Notes (Signed)
Patient c/o dizziness and lightheadedness x last night. No ear pain, new meds, or nausea. Patient has been eating on a regular interval.

## 2018-05-06 NOTE — Discharge Instructions (Addendum)
If cold-like symptoms develop, try the following: Take plain guaifenesin (1200mg  extended release tabs such as Mucinex) twice daily, with plenty of water, for cough and congestion.  Get adequate rest. May take Delsym Cough Suppressant at bedtime for nighttime cough.    Try using saline nasal spray several times daily and saline nasal irrigation (AYR is a common brand).   Stop all antihistamines for now, and other non-prescription cough/cold preparations.

## 2018-05-06 NOTE — ED Provider Notes (Signed)
[facility]    CSN: [id number] Arrival date & time: 05/06/18  1235     History   Chief Complaint Chief Complaint  Patient presents with  . Dizziness    HPI Michaela Holland is a 37 y.o. female.   Patient began to feel fatigued yesterday and last night she awakened at 1am with a sensation of the room spinning.  Her symptoms have persisted today.  No nausea/vomiting.  She has had mild increased nasal congestion for three days, but no sore throat or cough.  No fevers, chills, and sweats.  The history is provided by the patient.    Past Medical History:  Diagnosis Date  . Anxiety   . Back pain   . Chronic pain of left knee   . Depression   . Diabetes mellitus without complication (HCC)    denies this diagnosis   . Family history of adverse reaction to anesthesia    per patient ; father was having abdominal surgery and had to be placed on medicine to keep his blood pressure up; he never had any issues with low BP before the surgery "   . Gallbladder problem   . GERD (gastroesophageal reflux disease)   . Gout    believes only occurred once in feet;   . Hypertension    denies this diagnosis   . Hypothyroidism   . Joint pain   . Obesity   . Osteoarthritis   . Palpitations   . PCOS (polycystic ovarian syndrome)   . PONV (postoperative nausea and vomiting)    with c section; no issues with followwing procedures   . Seasonal allergies   . Seizures (HCC)    has had "pre-seizure" activity in the brain but deneis any actual seizures   . Sleep apnea    does not currently use due to insurance   . Vitamin D deficiency     Patient Active Problem List   Diagnosis Date Noted  . Overactive bladder 03/28/2018  . Urinary incontinence, nocturnal enuresis 03/28/2018  . Unilateral primary osteoarthritis, left knee 02/24/2018  . H/O arthroscopy of left knee 02/24/2018  . Baker cyst, left 01/01/2018  . Other fatigue 10/22/2017  . Shortness of breath on exertion 10/22/2017    . Vitamin D deficiency 10/22/2017  . S/P arthroscopy of left knee 07/18/2017  . Acute pain of left knee 06/17/2017  . Meniscal cyst, left 06/17/2017  . Cyst of lateral meniscus of left knee 05/08/2017  . Anterior cruciate ligament sprain 05/08/2017  . Morbid obesity (HCC) 05/08/2017  . History of obstructive sleep apnea 04/09/2017  . Hypothyroidism 04/09/2017  . PCOS (polycystic ovarian syndrome) 04/09/2017  . Anxiety 04/09/2017  . Chronic pain of left knee 04/09/2017  . Gastroesophageal reflux disease with esophagitis 04/09/2017    Past Surgical History:  Procedure Laterality Date  . CESAREAN SECTION    . CHOLECYSTECTOMY    . ESOPHAGOGASTRODUODENOSCOPY  2001 and 2018  . GANGLION CYST EXCISION Left 2014   foot  . KNEE ARTHROSCOPY Left 06/28/2017   Procedure: LEFT KNEE ARTHROSCOPY with debridement;  Surgeon: Kathryne HitchBlackman, Christopher Y, MD;  Location: [facility];  Service: Orthopedics;  Laterality: Left;    OB History    Gravida  1   Para      Term      Preterm      AB      Living  1     SAB      TAB  Ectopic      Multiple      Live Births               Home Medications    Prior to Admission medications   Medication Sig Start Date End Date Taking? Authorizing Provider  acetaminophen-codeine (TYLENOL #3) 300-30 MG tablet Take 1-2 tablets by mouth every 8 (eight) hours as needed for moderate pain. 03/26/18   Kathryne Hitch, MD  albuterol (PROVENTIL HFA;VENTOLIN HFA) 108 (90 Base) MCG/ACT inhaler Inhale 1-2 puffs into the lungs every 4 (four) hours as needed for wheezing or shortness of breath (bronchospasm). 12/19/17   Carlis Stable, PA-C  Black Pepper-Turmeric (TURMERIC CURCUMIN) 01-999 MG CAPS Take 1 capsule by mouth daily.    [provider]  Brexpiprazole (REXULTI) 1 MG TABS Take 1 tablet (1 mg total) by mouth every morning. 03/25/18   Eksir, Bo Mcclintock, MD  citalopram (CELEXA) 40 MG tablet Take 1 tablet (40 mg total) by  mouth daily. 02/13/18 02/13/19  Burnard Leigh, MD  clonazePAM (KLONOPIN) 0.5 MG tablet Take 0.5-1 tablets (0.25-0.5 mg total) by mouth 2 (two) times daily as needed for anxiety (#30 for 90 days.). 03/25/18   Burnard Leigh, MD  Cyanocobalamin 2000 MCG TBCR Take 1 tablet (2,000 mcg total) by mouth daily. 11/07/17   Filbert Schilder, MD  cyclobenzaprine (FLEXERIL) 10 MG tablet Take by mouth. 07/02/16   [provider]  diclofenac (VOLTAREN) 75 MG EC tablet TAKE 1 TABLET BY MOUTH 2 TIMES DAILY. 03/06/18   Kathryne Hitch, MD  EPINEPHrine 0.3 mg/0.3 mL IJ SOAJ injection Inject into the muscle. 09/26/16   [provider]  gabapentin (NEURONTIN) 300 MG capsule One tab PO qHS for a week, then BID for a week, then TID. May double weekly to a max of 3,600mg /day 09/06/17   Rodolph Bong, MD  Glucosamine-Chondroit-Vit C-Mn (GLUCOSAMINE CHONDR 1500 COMPLX PO) Take 1 capsule by mouth daily.    [provider]  ibuprofen (ADVIL,MOTRIN) 200 MG tablet Take 800 mg by mouth every 8 (eight) hours as needed (for pain.).    [provider]  levothyroxine (SYNTHROID, LEVOTHROID) 175 MCG tablet Take 1 tablet (175 mcg total) by mouth daily before breakfast. 02/14/18   Sunnie Nielsen, DO  meclizine (ANTIVERT) 25 MG tablet Take 1 tablet (25 mg total) by mouth 3 (three) times daily as needed for dizziness. 05/06/18   Lattie Haw, MD  metFORMIN (GLUCOPHAGE-XR) 500 MG 24 hr tablet Take 1 tablet (500 mg total) by mouth at bedtime. Patient taking differently: Take 1,000 mg by mouth at bedtime.  07/11/17   Romero Belling, MD  mirabegron ER (MYRBETRIQ) 25 MG TB24 tablet Take 1 tablet (25 mg total) by mouth daily. 03/28/18   Carlis Stable, PA-C  Nystatin POWD Apply liberally to affected area 2 times per day 01/09/18   Sunnie Nielsen, DO  omeprazole (PRILOSEC) 40 MG capsule TAKE 1 CAPSULE BY MOUTH EVERY EVENING. 05/06/18   Sunnie Nielsen, DO  Prenatal  MV-Min-FA-Omega-3 (PRENATAL GUMMIES/DHA & FA) 0.4-32.5 MG CHEW Chew by mouth.    [provider]  ranitidine (ZANTAC) 150 MG tablet Take 150 mg by mouth daily as needed for heartburn.     [provider]  Vitamin D, Ergocalciferol, (DRISDOL) 50000 units CAPS capsule TAKE 1 CAPSULE BY MOUTH EVERY 7 DAYS 05/06/18   Alois Cliche, PA-C    Family History Family History  Problem Relation Age of Onset  . Polycystic ovary syndrome  Mother   . Hypothyroidism Mother   . Hyperlipidemia Mother   . Depression Mother   . Obesity Mother   . Polycystic ovary syndrome Sister   . Hypothyroidism Sister   . Hypertension Father   . Hyperlipidemia Father   . Cancer Father   . Depression Father   . Anxiety disorder Father   . Obesity Father   . Asthma Other     Social History Social History   Tobacco Use  . Smoking status: Never Smoker  . Smokeless tobacco: Never Used  Substance Use Topics  . Alcohol use: Never    Frequency: Never  . Drug use: No     Allergies   Sulfa antibiotics; Food; and Other   Review of Systems Review of Systems No sore throat No cough No pleuritic pain No wheezing + nasal congestion + post-nasal drainage No sinus pain/pressure No itchy/red eyes No earache + dizziness No hemoptysis No SOB No fever/chills No nausea No vomiting No abdominal pain No diarrhea No urinary symptoms No skin rash + fatigue No myalgias  No headache Used OTC meds without relief  Physical Exam Triage Vital Signs ED Triage Vitals [05/06/18 1300]  Enc Vitals Group     BP 136/82     Pulse Rate 66     Resp 16     Temp 98.5 F (36.9 C)     Temp Source Oral     SpO2 98 %     Weight (!) 322 lb (146.1 kg)     Height      Head Circumference      Peak Flow      Pain Score 0     Pain Loc      Pain Edu?      Excl. in GC?    No data found.  Updated Vital Signs BP 136/82 (BP Location: Right Arm)   Pulse 66   Temp 98.5 F (36.9 C) (Oral)   Resp 16    Wt (!) 146.1 kg   LMP 04/09/2018   SpO2 98%   BMI 56.59 kg/m   Visual Acuity Right Eye Distance:   Left Eye Distance:   Bilateral Distance:    Right Eye Near:   Left Eye Near:    Bilateral Near:     Physical Exam Nursing notes and Vital Signs reviewed. Appearance:  Patient appears stated age, and in no acute distress Eyes:  Pupils are equal, round, and reactive to light and accomodation.  Extraocular movement is intact.  Conjunctivae are not inflamed. Fundi benign.  Slight nystagmus to the left.  Ears:  Canals normal.  Tympanic membranes normal.  Nose:  Mildly congested turbinates.  No sinus tenderness.   Pharynx:  Normal Neck:  Supple.  No adenopathy   Lungs:  Clear to auscultation.  Breath sounds are equal.  Moving air well. Heart:  Regular rate and rhythm without murmurs, rubs, or gallops.  Abdomen:  Nontender without masses or hepatosplenomegaly.  Bowel sounds are present.  No CVA or flank tenderness.  Extremities:  No edema.  Skin:  No rash present.   Neurologic:  Cranial nerves 2 through 12 are normal.  Patellar, achilles, and elbow reflexes are normal.  Cerebellar function is intact (finger-to-nose and rapid alternating hand movement).  Gait and station are normal.   UC Treatments / Results  Labs (all labs ordered are listed, but only abnormal results are displayed) Labs Reviewed - No data to display  EKG None  Radiology No results found.  Procedures Procedures (including critical care time)  Medications Ordered in UC Medications - No data to display  Initial Impression / Assessment and Plan / UC Course  I have reviewed the triage vital signs and the nursing notes.  Pertinent labs & imaging results that were available during my care of the patient were reviewed by me and considered in my medical decision making (see chart for details).    Suspect early viral URI Begin Antivert. Followup with Family Doctor if not improved in one week.    Final Clinical  Impressions(s) / UC Diagnoses   Final diagnoses:  Benign paroxysmal positional vertigo, unspecified laterality     Discharge Instructions     If cold-like symptoms develop, try the following: Take plain guaifenesin (1200mg  extended release tabs such as Mucinex) twice daily, with plenty of water, for cough and congestion.  Get adequate rest. May take Delsym Cough Suppressant at bedtime for nighttime cough.    Try using saline nasal spray several times daily and saline nasal irrigation (AYR is a common brand).   Stop all antihistamines for now, and other non-prescription cough/cold preparations.        ED Prescriptions    Medication Sig Dispense Auth. Provider   meclizine (ANTIVERT) 25 MG tablet Take 1 tablet (25 mg total) by mouth 3 (three) times daily as needed for dizziness. 15 tablet Lattie Haw, MD         Lattie Haw, MD 05/07/18 2016

## 2018-05-07 NOTE — Progress Notes (Signed)
Office: [phone]  /  Fax: [phone]   Date: May 13, 2018 Time Seen: 3:28pm Duration: 35 minutes Provider: Lawerance CruelGaytri Osmani Kersten, Psy.D. Type of Session: Individual Therapy   HPI: Michaela Holland was referred by Alois Clicheracey Aguilar PA-C for depression with emotional eating behaviors. Per the note for the visit with Maia Pettiesracey Aguilar PA-C on March 25, 2018, "Annereports stress eating with husband's recent surgery. Zhana struggleswith emotional eating and using food for comfort to the extent that it is negatively impacting herhealth. Sheoften snacks when sheis not hungry. Annesometimes feels sheis out of control and then feels guilty that shemade poor food choices. Lavetta NielsenShehas been working on behavior modification techniques to help reduce heremotional eating and has been somewhat successful.Sheshows no sign of suicidal or homicidal ideations."Per the note for the initial visit with this provider on April 14, 2018, Michaela Holland reported she eats for "many different reasons." She explainedshe eats for hunger, and various emotions. Michaela Holland noted she does not eat to the point of being sick. She denied starving herself and purging. She described increased awareness recently related to her eating patterns. Jeriann noted, "I'm looking into weight loss of surgery." Michaela Holland was asked to complete a questionnaire assessing various behaviors related to emotional eating. Michaela Holland endorsed the following: overeat when you are celebrating; experience food cravings on a regular basis; eat certain foods when you are anxious, stressed, depressed, or your feelings are hurt; use food to help you cope with emotional situations; find food is comforting to you; overeat when you are angry or upset; overeat when you are worried about something; overeat frequently when you are bored or lonely; not worry about what you eat when you are in a good mood; overeat when you are alone, but eat much less when you are with other people; eat to help you stay awake; and eat  as a reward. Moreover, per the note for the initial visit with Dr. Debbra RidingAlexandria Kadolph on October 22, 2017, Annehas been heavy most of herlife and shestarted gaining weight in high school. Herheaviest weight ever was 314pounds. 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 She feels she got "addicted" to eating pizza and bakery items there as she was allowed to eat those items while she was working. Once she started college, Michaela Holland shared she was on her own and her "helping sizes were large."During today's appointment, Michaela Holland shared ongoing instances of emotional eating; however, described doing "better" as she is able to identify when she is experiencing physical versus emotional hunger.   Session Content: Session focused on reviewing progress to date. The session was initiated with the administration of the PHQ-9 and GAD-7, as well as a brief check-in. Michaela Holland reported experiencing "some anxiety and depression in relation to work." She explained she did not "get the job" she interviewed for. However, during today's appointment, Michaela Holland indicated, "God has a plan." Regarding her upcoming bariatric surgery, Michaela Holland shared she is "nervous" and "excited." She explained while she is "disappointed" that she will not be meeting with the providers with this practice post-surgery, she has a team of providers in place for support. Remainder of session focused on reviewing physical and emotional hunger and discussing triggers for emotional eating.  Skyelar explained she read the handout for  triggers previously provided and when asked what stood out to her, she reported, "A lot of these stood out." Currently, she often experiences the following triggers: avoiding unpleasant feelings and squelching urgency. She indicated, "I have been working on that" referring to squelching urgency and described feeling "empowered." Remainder of session focused on the impact of emotional eating as it relates to avoiding unpleasant emotions. Psychoeducation regarding the impact of engagement in pleasurable activities when triggered and to cope overall with stress was provided. Tylasia was encouraged to identify various enjoyable activities to engage that vary in time. During today's session, she identified the following: getting her nails done and going to the pool to swim. Joshua was receptive to today's session as evidenced by her discussing recent events and her willingness to engage in pleasurable activities to cope. She was open to sharing and responsive to feedback.   Mental Status Examination: Catherina arrived early for the appointment; therefore, the appointment was initiated a couple minutes early. She presented as appropriately dressed and groomed. Romanita appeared her stated age and demonstrated adequate orientation to time, place, person, and purpose of the appointment. She also demonstrated appropriate eye contact. No psychomotor abnormalities or behavioral peculiarities noted. Her mood was euthymic with congruent affect. Her thought processes were logical, linear, and goal-directed. No hallucinations, delusions, bizarre thinking or behavior reported or observed. Judgment, insight, and impulse control appeared to be grossly intact. There was no evidence of paraphasias (i.e., errors in speech, gross mispronunciations, and word substitutions), repetition deficits, or disturbances in volume or prosody (i.e., rhythm and intonation). There was no evidence of attention or memory impairments. Laveah denied current suicidal and  homicidal ideation, intent or plan.  Structured Assessment Results: The Patient Health Questionnaire-9 (PHQ-9) is a self-report measure that assesses symptoms and severity of depression over the course of the last two weeks. Arvella obtained a score of three suggesting minimal depression. Jamala finds the endorsed symptoms to be somewhat difficult. Depression screen PHQ 2/9 05/13/2018  Decreased Interest 1  Down, Depressed, Hopeless 0  PHQ - 2 Score 1  Altered sleeping 0  Tired, decreased energy 1  Change in appetite 1  Feeling bad or failure about yourself  0  Trouble concentrating 0  Moving slowly or fidgety/restless 0  Suicidal thoughts 0  PHQ-9 Score 3  Difficult doing work/chores -   The Generalized Anxiety Disorder-7 (GAD-7) is a brief self-report measure that assesses symptoms of anxiety over the course of the last two weeks. Gracianna obtained a score of five suggesting mild anxiety. GAD 7 : Generalized Anxiety Score 05/13/2018  Nervous, Anxious, on Edge 1  Control/stop worrying 1  Worry too much - different things 1  Trouble relaxing 1  Restless 0  Easily annoyed or irritable 1  Afraid - awful might happen 0  Total GAD 7 Score 5  Anxiety Difficulty Somewhat difficult   Interventions:  Throughout today's session, empathic reflections and validation were provided. Darilyn was administered the PHQ-9 and GAD-7 for symptom monitoring and track progress to date. Content from sessions to date was reviewed, which included emotional versus physical hunger, as well as triggers for emotional eating. Easter was provided psychoeducation regarding the impact of engagement in pleasurable activities on emotional eating and overall coping with stressors. Kharisma indicated she lost her handout for triggers for emotional eating; therefore, she was provided with an additional copy.   Treatment Goal & Progress: During the initial appointment with this provider on April 14, 2018, the goal of reducing emotional eating  was established. Since the initial appointment, Terissa demonstrated progress in her goal of reducing emotional eating as evidenced by her awareness of emotional versus physical hunger, as well as awareness of her triggers for emotional eating. During today's appointment she described doing "better."  As noted below, Tache will continue therapeutic services with Dr. Dareen Piano.    DSM-5 Diagnosis: 311 (F32.8) Other Specified Depressive Disorder, With Anxious Distress and Emotional Eating Behaviors  Plan: Valor will be initiating longer-term therapeutic services with Dr. Juliet Rude. Dareen Piano at [facility]. Her appointment is scheduled for May 29, 2018, which is following her two week vacation. She also discussed having other professionals as part of her team for support following her bariatric surgery.

## 2018-05-08 MED FILL — GABAPENTIN 300 MG CAPSULE: 300 | 15 days supply | Qty: 180 | Fill #2

## 2018-05-08 MED FILL — LEVOTHYROXINE 175 MCG TAB: 175 | 90 days supply | Qty: 90 | Fill #1

## 2018-05-08 MED FILL — MYRBETRIQ ER 25 MG TABLET: 25 | 30 days supply | Qty: 30 | Fill #0 | Status: TO

## 2018-05-11 ENCOUNTER — Ambulatory Visit (INDEPENDENT_AMBULATORY_CARE_PROVIDER_SITE_OTHER): Payer: 59 | Admitting: Neurology

## 2018-05-11 DIAGNOSIS — F329 Major depressive disorder, single episode, unspecified: Secondary | ICD-10-CM

## 2018-05-11 DIAGNOSIS — Z6841 Body Mass Index (BMI) 40.0 and over, adult: Secondary | ICD-10-CM

## 2018-05-11 DIAGNOSIS — F419 Anxiety disorder, unspecified: Secondary | ICD-10-CM

## 2018-05-11 DIAGNOSIS — F32A Depression, unspecified: Secondary | ICD-10-CM

## 2018-05-11 DIAGNOSIS — G4719 Other hypersomnia: Secondary | ICD-10-CM

## 2018-05-11 DIAGNOSIS — Z87448 Personal history of other diseases of urinary system: Secondary | ICD-10-CM

## 2018-05-11 DIAGNOSIS — R351 Nocturia: Secondary | ICD-10-CM

## 2018-05-11 DIAGNOSIS — R635 Abnormal weight gain: Secondary | ICD-10-CM

## 2018-05-11 DIAGNOSIS — G4733 Obstructive sleep apnea (adult) (pediatric): Secondary | ICD-10-CM | POA: Diagnosis not present

## 2018-05-11 DIAGNOSIS — R9401 Abnormal electroencephalogram [EEG]: Secondary | ICD-10-CM

## 2018-05-13 ENCOUNTER — Ambulatory Visit (INDEPENDENT_AMBULATORY_CARE_PROVIDER_SITE_OTHER): Payer: 59 | Admitting: Psychology

## 2018-05-13 ENCOUNTER — Ambulatory Visit: Payer: 59 | Admitting: Psychiatry

## 2018-05-13 ENCOUNTER — Ambulatory Visit (INDEPENDENT_AMBULATORY_CARE_PROVIDER_SITE_OTHER): Payer: 59 | Admitting: Bariatrics

## 2018-05-13 VITALS — BP 130/84 | HR 80 | Temp 97.7°F | Ht 64.0 in | Wt 320.0 lb

## 2018-05-13 DIAGNOSIS — F3289 Other specified depressive episodes: Secondary | ICD-10-CM | POA: Diagnosis not present

## 2018-05-13 DIAGNOSIS — G4733 Obstructive sleep apnea (adult) (pediatric): Secondary | ICD-10-CM | POA: Diagnosis not present

## 2018-05-13 DIAGNOSIS — Z6841 Body Mass Index (BMI) 40.0 and over, adult: Secondary | ICD-10-CM

## 2018-05-13 DIAGNOSIS — E559 Vitamin D deficiency, unspecified: Secondary | ICD-10-CM | POA: Diagnosis not present

## 2018-05-13 DIAGNOSIS — E282 Polycystic ovarian syndrome: Secondary | ICD-10-CM

## 2018-05-13 NOTE — Progress Notes (Signed)
Office: [phone]  /  Fax: [phone]   HPI:   Chief Complaint: OBESITY Michaela Holland is here to discuss her progress with her obesity treatment plan. She is on the Category 3 plan plus 300 calories and is following her eating plan approximately 35 to 40 % of the time. She states she is swimming 60 minutes 2 times per week. Michaela Holland has increased snacking and the types of snacks in the evening. She is on Brexipiprazole, which can cause weight gain, from the psychiatrist for 2 months. She thinks the snacking is related to the medication (primarily carbohydrates). She has increased her water intake which she thinks has helped some of the snacking.  Her weight is (!) 320 lb (145.2 kg) today and has not lost weight since her last visit. She has lost 0 lbs since starting treatment with [place].  Vitamin D deficiency Michaela Holland has a diagnosis of vitamin D deficiency. She is currently taking vit D 50,000 IU weekly. She admits to a slight decrease in energy in the evening and denies nausea, vomiting, or muscle weakness.  Polycystic Ovarian Syndrome Michaela Holland's last A1c was 4.9 and her insulin was 8.8. She is taking metformin 500mg  - 1 tablet in the morning and 2 tablets in the evening.  Sleep Apnea Michaela Holland's previous sleep study showed mild to moderate sleep apnea. She will be having a new split visit sleep study.  Depression with emotional eating behaviors Michaela Holland is struggling with emotional eating and using food for comfort to the extent that it is negatively impacting her health. She is having increased cravings at night. She is planning on having bariatric surgery in the future. She shows no sign of suicidal or homicidal ideations.  Depression screen Michaela Holland 2/9 05/13/2018 04/30/2018 04/30/2018 04/14/2018 01/09/2018  Decreased Interest 1 1 0 1 1  Down, Depressed, Hopeless 0 1 1 1 1   PHQ - 2 Score 1 2 1 2 2   Altered sleeping 0 0 0 0 0  Tired, decreased energy 1 1 1 1 3   Change in appetite 1 2 2 2 2   Feeling bad or failure about  yourself  0 1 1 1 1   Trouble concentrating 0 0 0 0 0  Moving slowly or fidgety/restless 0 0 0 0 0  Suicidal thoughts 0 0 0 0 0  PHQ-9 Score 3 6 5 6 8   Difficult doing work/chores - Somewhat difficult - - Somewhat difficult     ALLERGIES: Allergies  Allergen Reactions  . Sulfa Antibiotics Hives and Shortness Of Breath  . Food     Allergy to walnuts, pecans, [place] nuts, hazel nuts (she can eat other nuts).  . Other     Nuts severe reaction difficulty breathing , throat swelling; mostly tree nuts but not all tree nuts     MEDICATIONS: Current Outpatient Medications on File Prior to Visit  Medication Sig Dispense Refill  . acetaminophen-codeine (TYLENOL #3) 300-30 MG tablet Take 1-2 tablets by mouth every 8 (eight) hours as needed for moderate pain. 50 tablet 0  . albuterol (PROVENTIL HFA;VENTOLIN HFA) 108 (90 Base) MCG/ACT inhaler Inhale 1-2 puffs into the lungs every 4 (four) hours as needed for wheezing or shortness of breath (bronchospasm). 1 Inhaler 0  . Black Pepper-Turmeric (TURMERIC CURCUMIN) 01-999 MG CAPS Take 1 capsule by mouth daily.    . Brexpiprazole (REXULTI) 1 MG TABS Take 1 tablet (1 mg total) by mouth every morning. 90 tablet 0  . citalopram (CELEXA) 40 MG tablet Take 1 tablet (40 mg total)  by mouth daily. 90 tablet 0  . clonazePAM (KLONOPIN) 0.5 MG tablet Take 0.5-1 tablets (0.25-0.5 mg total) by mouth 2 (two) times daily as needed for anxiety (#30 for 90 days.). 30 tablet 0  . Cyanocobalamin 2000 MCG TBCR Take 1 tablet (2,000 mcg total) by mouth daily. 30 tablet 0  . cyclobenzaprine (FLEXERIL) 10 MG tablet Take by mouth.    . diclofenac (VOLTAREN) 75 MG EC tablet TAKE 1 TABLET BY MOUTH 2 TIMES DAILY. 60 tablet 1  . EPINEPHrine 0.3 mg/0.3 mL IJ SOAJ injection Inject into the muscle.    . gabapentin (NEURONTIN) 300 MG capsule One tab PO qHS for a week, then BID for a week, then TID. May double weekly to a max of 3,600mg /day 180 capsule 3  . Glucosamine-Chondroit-Vit  C-Mn (GLUCOSAMINE CHONDR 1500 COMPLX PO) Take 1 capsule by mouth daily.    Marland Kitchen ibuprofen (ADVIL,MOTRIN) 200 MG tablet Take 800 mg by mouth every 8 (eight) hours as needed (for pain.).    Marland Kitchen levothyroxine (SYNTHROID, LEVOTHROID) 175 MCG tablet Take 1 tablet (175 mcg total) by mouth daily before breakfast. 90 tablet 3  . meclizine (ANTIVERT) 25 MG tablet Take 1 tablet (25 mg total) by mouth 3 (three) times daily as needed for dizziness. 15 tablet 1  . metFORMIN (GLUCOPHAGE-XR) 500 MG 24 hr tablet Take 1 tablet (500 mg total) by mouth at bedtime. (Patient taking differently: Take 1,000 mg by mouth at bedtime. ) 90 tablet 3  . mirabegron ER (MYRBETRIQ) 25 MG TB24 tablet Take 1 tablet (25 mg total) by mouth daily. 30 tablet 3  . Nystatin POWD Apply liberally to affected area 2 times per day 1 Bottle 11  . omeprazole (PRILOSEC) 40 MG capsule TAKE 1 CAPSULE BY MOUTH EVERY EVENING. 30 capsule 1  . Prenatal MV-Min-FA-Omega-3 (PRENATAL GUMMIES/DHA & FA) 0.4-32.5 MG CHEW Chew by mouth.    . ranitidine (ZANTAC) 150 MG tablet Take 150 mg by mouth daily as needed for heartburn.     . Vitamin D, Ergocalciferol, (DRISDOL) 50000 units CAPS capsule TAKE 1 CAPSULE BY MOUTH EVERY 7 DAYS 4 capsule 0   No current facility-administered medications on file prior to visit.     PAST MEDICAL HISTORY: Past Medical History:  Diagnosis Date  . Anxiety   . Back pain   . Chronic pain of left knee   . Depression   . Diabetes mellitus without complication (HCC)    denies this diagnosis   . Family history of adverse reaction to anesthesia    per patient ; father was having abdominal surgery and had to be placed on medicine to keep his blood pressure up; he never had any issues with low BP before the surgery "   . Gallbladder problem   . GERD (gastroesophageal reflux disease)   . Gout    believes only occurred once in feet;   . Hypertension    denies this diagnosis   . Hypothyroidism   . Joint pain   . Obesity   .  Osteoarthritis   . Palpitations   . PCOS (polycystic ovarian syndrome)   . PONV (postoperative nausea and vomiting)    with c section; no issues with followwing procedures   . Seasonal allergies   . Seizures (HCC)    has had "pre-seizure" activity in the brain but deneis any actual seizures   . Sleep apnea    does not currently use due to insurance   . Vitamin D deficiency  PAST SURGICAL HISTORY: Past Surgical History:  Procedure Laterality Date  . CESAREAN SECTION    . CHOLECYSTECTOMY    . ESOPHAGOGASTRODUODENOSCOPY  2001 and 2018  . GANGLION CYST EXCISION Left 2014   foot  . KNEE ARTHROSCOPY Left 06/28/2017   Procedure: LEFT KNEE ARTHROSCOPY with debridement;  Surgeon: Kathryne Hitch, MD;  Location: [facility];  Service: Orthopedics;  Laterality: Left;    SOCIAL HISTORY: Social History   Tobacco Use  . Smoking status: Never Smoker  . Smokeless tobacco: Never Used  Substance Use Topics  . Alcohol use: Never    Frequency: Never  . Drug use: No    FAMILY HISTORY: Family History  Problem Relation Age of Onset  . Polycystic ovary syndrome Mother   . Hypothyroidism Mother   . Hyperlipidemia Mother   . Depression Mother   . Obesity Mother   . Polycystic ovary syndrome Sister   . Hypothyroidism Sister   . Hypertension Father   . Hyperlipidemia Father   . Cancer Father   . Depression Father   . Anxiety disorder Father   . Obesity Father   . Asthma Other     ROS: Review of Systems  Constitutional: Negative for weight loss.  Gastrointestinal: Negative for nausea and vomiting.  Musculoskeletal:       Negative for muscle weakness.  Psychiatric/Behavioral: Positive for depression. Negative for suicidal ideas.       Negative for homicidal ideations.    PHYSICAL EXAM: Blood pressure 130/84, pulse 80, temperature 97.7 F (36.5 C), temperature source Oral, height 5\' 4"  (1.626 m), weight (!) 320 lb (145.2 kg), last menstrual period 05/09/2018, SpO2 96  %. Body mass index is 54.93 kg/m. Physical Exam  Constitutional: She is oriented to person, place, and time. She appears well-developed and well-nourished.  Cardiovascular: Normal rate.  Pulmonary/Chest: Effort normal.  Musculoskeletal: Normal range of motion.  Neurological: She is oriented to person, place, and time.  Skin: Skin is warm and dry.  Psychiatric: She has a normal mood and affect. Her behavior is normal.  Vitals reviewed.   RECENT LABS AND TESTS: BMET    Component Value Date/Time   NA 137 02/14/2018 1119   NA 138 10/22/2017 1236   K 4.4 02/14/2018 1119   CL 103 02/14/2018 1119   CO2 26 02/14/2018 1119   GLUCOSE 86 02/14/2018 1119   BUN 14 02/14/2018 1119   BUN 10 10/22/2017 1236   CREATININE 0.69 02/14/2018 1119   CALCIUM 9.3 02/14/2018 1119   GFRNONAA 112 02/14/2018 1119   GFRAA 130 02/14/2018 1119   Lab Results  Component Value Date   HGBA1C 4.9 10/22/2017   HGBA1C 5.1 07/11/2017   Lab Results  Component Value Date   INSULIN 8.8 10/22/2017   CBC    Component Value Date/Time   WBC 6.9 02/14/2018 1119   RBC 4.63 02/14/2018 1119   HGB 12.8 02/14/2018 1119   HGB 12.8 10/22/2017 1236   HCT 38.3 02/14/2018 1119   HCT 37.8 10/22/2017 1236   PLT 349 02/14/2018 1119   MCV 82.7 02/14/2018 1119   MCV 84 10/22/2017 1236   MCH 27.6 02/14/2018 1119   MCHC 33.4 02/14/2018 1119   RDW 12.9 02/14/2018 1119   RDW 13.5 10/22/2017 1236   LYMPHSABS 1.7 10/22/2017 1236   EOSABS 0.2 10/22/2017 1236   BASOSABS 0.0 10/22/2017 1236   Iron/TIBC/Ferritin/ %Sat No results found for: IRON, TIBC, FERRITIN, IRONPCTSAT Lipid Panel     Component Value Date/Time  CHOL 179 03/05/2018 1023   TRIG 142 03/05/2018 1023   HDL 52 03/05/2018 1023   LDLCALC 99 03/05/2018 1023   Hepatic Function Panel     Component Value Date/Time   PROT 6.8 02/14/2018 1119   PROT 6.8 10/22/2017 1236   ALBUMIN 4.1 10/22/2017 1236   AST 12 02/14/2018 1119   ALT 12 02/14/2018 1119    ALKPHOS 61 10/22/2017 1236   BILITOT 0.6 02/14/2018 1119   BILITOT 0.5 10/22/2017 1236      Component Value Date/Time   TSH 0.96 02/14/2018 1119   TSH 2.950 10/22/2017 1236   TSH 1.94 07/11/2017 1204   Results for ANISA, MCLOUD (MRN [id number]) as of 05/13/2018 17:00  Ref. Range 03/05/2018 10:23  Vitamin D, 25-Hydroxy Latest Ref Range: 30.0 - 100.0 ng/mL 34.4   ASSESSMENT AND PLAN: Vitamin D deficiency  PCOS (polycystic ovarian syndrome)  Obstructive sleep apnea syndrome  Other depression - with emotional eating  Class 3 severe obesity with serious comorbidity and body mass index (BMI) of 50.0 to 59.9 in adult, unspecified obesity type (HCC)  PLAN:  Vitamin D Deficiency Michaela Holland was informed that low vitamin D levels contributes to fatigue and are associated with obesity, breast, and colon cancer. She agrees to continue to take prescription Vit D @50 ,000 IU every week and eat foods high in vitamin D. She agreed to follow up for routine testing of vitamin D, at least 2-3 times per year. She was informed of the risk of over-replacement of vitamin D and agrees to not increase her dose unless she discusses this with [place] first.  Polycystic Ovarian Syndrome Michaela Holland will take her metformin as directed and will not skip doses. She will set an alarm if needed.    Sleep Apnea Michaela Holland will follow up with her neurologist. We will do an indirect calorimetry at her next visit in 2 weeks.   Depression with Emotional Eating Behaviors We discussed behavior modification techniques today to help Michaela Holland deal with her emotional eating and depression. She will meet with Michaela Holland, our bariatric psychologist for evaluation today, but she will need to get clearance for surgery from her regular psychiatrist.  I spent > than 50% of the 15 minute visit on counseling as documented in the note.  Obesity Michaela Holland is currently in the action stage of change. As such, her goal is to continue with weight loss efforts. She has  agreed to follow the Category 3 plan plus 300 calories. Michaela Holland has been instructed to swim for about an hour for 3 times a week and work up to a goal of 150 minutes of combined cardio and strengthening exercise per week for weight loss and overall health benefits. We discussed the following Behavioral Modification Strategies today: increasing lean protein intake, decreasing simple carbohydrates, increasing vegetables, increase H2O intake, and travel eating strategies. She agreed not to skip metformin doses and increase water and vegetables. We will check her RMR at the next visit to see if she needs a calorie amount changed. Kadey has agreed to follow up with our clinic in 2 weeks. She was informed of the importance of frequent follow up visits to maximize her success with intensive lifestyle modifications for her multiple health conditions.   OBESITY BEHAVIORAL INTERVENTION VISIT  Today's visit was # 12  Starting weight: 313 lbs Starting date: 10/22/17 Today's weight : Weight: (!) 320 lb (145.2 kg)  Today's date: 05/13/2018 Total lbs lost to date: 0 We discussed the following behavioral interventions at this  visit.   ASK: We discussed the diagnosis of obesity with Michaela Holland today and Michaela Holland agreed to give [place] permission to discuss obesity behavioral modification therapy today.  ASSESS: Michaela Holland has the diagnosis of obesity and her BMI today is 54.9. Michaela Holland is in the action stage of change.  ADVISE: Michaela Holland was educated on the multiple health risks of obesity as well as the benefit of weight loss to improve her health. She was advised of the need for long term treatment and the importance of lifestyle modifications to improve her current health and to decrease her risk of future health problems.  AGREE: Multiple dietary modification options and treatment options were discussed and Michaela Holland agreed to follow the recommendations documented in the above note.  ARRANGE: Michaela Holland was educated on the importance of  frequent visits to treat obesity as outlined per CMS and USPSTF guidelines and agreed to schedule her next follow up appointment today.  I, Kirke Corin, am acting as Energy manager for [employer]. Manson Passey, DO  I have reviewed the above documentation for accuracy and completeness, and I agree with the above. -Corinna Capra, DO

## 2018-05-14 ENCOUNTER — Encounter (INDEPENDENT_AMBULATORY_CARE_PROVIDER_SITE_OTHER): Payer: Self-pay | Admitting: Bariatrics

## 2018-05-16 NOTE — Procedures (Signed)
PATIENT'S NAME:  Michaela Holland, Michaela Holland DOB:      1981-08-09      MR#:    [id number]     DATE OF RECORDING: 05/11/2018 REFERRING M.D.:  Sunnie Nielsen, DO Study Performed:   Baseline Polysomnogram HISTORY: 37 year old woman with a history of obstructive sleep apnea, vitamin D deficiency, seasonal allergies, PCO S, osteoarthritis, hypothyroidism, morbid obesity, hypertension, reflux disease, gout, diabetes, depression, anxiety, and back pain, who presents for re-evaluation of her obstructive sleep apnea. She is pursuing bariatric surgery. The patient endorsed the Epworth Sleepiness Scale at 8/24 points. The patient's weight 317 pounds with a height of 64 (inches), resulting in a BMI of 54.2 kg/m2. The patient's neck circumference measured 15.5 inches.  CURRENT MEDICATIONS: tylenon, Proventil, Ventolin, rexulti, celexa, klonopin, cyancocobalamin, flexeril, voltaren, epinphrine, diflucan, neurontink, glucosamine, chondr, advil, motrin, synthroid, levothroid, metformin, myrbetriq, Nystatin, prilosec, Zantac, drisdol   PROCEDURE:  This is a multichannel digital polysomnogram utilizing the Somnostar 11.2 system.  Electrodes and sensors were applied and monitored per AASM Specifications.   EEG, EOG, Chin and Limb EMG, were sampled at 200 Hz.  ECG, Snore and Nasal Pressure, Thermal Airflow, Respiratory Effort, CPAP Flow and Pressure, Oximetry was sampled at 50 Hz. Digital video and audio were recorded.      BASELINE STUDY  Lights Out was at 22:39 and Lights On at 05:17.  Total recording time (TRT) was 381.5 minutes, with a total sleep time (TST) of 329.5 minutes.   The patient's sleep latency was 41 minutes, which is delayed. REM latency was 157 minutes, which is delayed. The sleep efficiency was 86.4 %.     SLEEP ARCHITECTURE: WASO (Wake after sleep onset) was 20 minutes with minimal sleep fragmentation noted. There were 13 minutes in Stage N1, 188 minutes Stage N2, 68 minutes Stage N3 and 60.5 minutes in Stage REM.   The percentage of Stage N1 was 3.9%, Stage N2 was 57.1%, which is mildly increased, Stage N3 was 20.6%, which is normal and Stage R (REM sleep) was 18.4%, which is near-normal. The arousals were noted as: 29 were spontaneous, 0 were associated with PLMs, 2 were associated with respiratory events.  RESPIRATORY ANALYSIS:  There were a total of 30 respiratory events:  0 obstructive apneas, 0 central apneas and 2 mixed apneas with a total of 2 apneas and an apnea index (AI) of .4 /hour. There were 28 hypopneas with a hypopnea index of 5.1 /hour. The patient also had 0 respiratory event related arousals (RERAs).      The total APNEA/HYPOPNEA INDEX (AHI) was 5.5 /hour and the total RESPIRATORY DISTURBANCE INDEX was  5.5 /hour.  26 events occurred in REM sleep and 8 events in NREM. The REM AHI was 25.8/hour, versus a non-REM AHI of .9. The patient spent 147 minutes of total sleep time in the supine position and 183 minutes in non-supine. The supine AHI was 10.6 versus a non-supine AHI of 1.3.  OXYGEN SATURATION & C02:  The Wake baseline 02 saturation was 96%, with the lowest being 89%. Time spent below 89% saturation equaled 0 minutes.  PERIODIC LIMB MOVEMENTS: The patient had a total of 0 Periodic Limb Movements.  The Periodic Limb Movement (PLM) index was 0 and the PLM Arousal index was 0/hour.   Audio and video analysis did not show any abnormal or unusual movements, behaviors, phonations or vocalizations. The patient took 1 bathroom break. Mild intermittent snoring was noted. The EKG was in keeping with normal sinus rhythm (NSR).   Post-study,  the patient indicated that sleep was worse than usual.   IMPRESSION:  1. Obstructive Sleep Apnea (OSA)  RECOMMENDATIONS:  1. This study demonstrates overall very mild obstructive sleep apnea, more pronounced during REM sleep with a total AHI of 5.5/hour, REM AHI of 25.8/hour, supine AHI of 10.4/hour, and O2 nadir of 89%. The absence of supine sleep likely  underestimates his AHI and O2 nadir. Given the patient's medical history and sleep related complaints, and in light of planning weight loss surgery, treatment with positive airway pressure is a reasonable consideration; this can be achieved in the form of autoPAP. Alternatively, a full-night CPAP titration study would allow optimization of therapy, if needed. Other treatment options may include avoidance of supine sleep position along with weight loss, upper airway or jaw surgery in selected patients or the use of an oral appliance in certain patients. ENT evaluation and/or consultation with a maxillofacial surgeon or dentist may be feasible in some instances.    2. Please note that untreated obstructive sleep apnea may carry additional perioperative morbidity. Patients with significant obstructive sleep apnea should receive perioperative PAP therapy and the surgeons and particularly the anesthesiologist should be informed of the diagnosis and the severity of the sleep disordered breathing. 3. The patient should be cautioned not to drive, work at heights, or operate dangerous or heavy equipment when tired or sleepy. Review and reiteration of good sleep hygiene measures should be pursued with any patient. 4. The patient will be seen in follow-up by Dr. Frances Furbish at [facility] for discussion of the test results and further management strategies. The referring provider will be notified of the test results.  I certify that I have reviewed the entire raw data recording prior to the issuance of this report in accordance with the Standards of Accreditation of the American Academy of Sleep Medicine (AASM)   Huston Foley, MD, PhD Diplomat, American Board of Neurology and Sleep Medicine (Neurology and Sleep Medicine)

## 2018-05-16 NOTE — Addendum Note (Signed)
Addended by: Huston Foley on: 05/16/2018 10:29 AM   Modules accepted: Orders

## 2018-05-16 NOTE — Progress Notes (Signed)
Patient referred by Dr. Lyn Hollingshead, seen by me on 04/24/18, diagnostic PSG on 05/11/18.    Please call and notify the patient that the recent sleep study did confirm the diagnosis of obstructive sleep apnea. OSA is overall mild, but worth treating to see if she feels better after treatment (and in light of planning weight loss surgery in the near future). To that end I recommend treatment for this in the form of autoPAP, which means, that we don't have to bring her back for a second sleep study with CPAP, but will let her try an autoPAP machine at home, through a DME company (of her choice, or as per insurance requirement). The DME representative will educate her on how to use the machine, how to put the mask on, etc. I have placed an order in the chart. Please send referral, talk to patient, send report to referring MD. We will need a FU in sleep clinic for 10 weeks post-PAP set up, please arrange that with me or one of our NPs. Thanks,   Huston Foley, MD, PhD [facility])

## 2018-05-19 ENCOUNTER — Telehealth: Payer: Self-pay

## 2018-05-19 NOTE — Telephone Encounter (Signed)
-----   Message from Huston Foley, MD sent at 05/16/2018 10:29 AM EDT ----- Patient referred by Dr. Lyn Hollingshead, seen by me on 04/24/18, diagnostic PSG on 05/11/18.    Please call and notify the patient that the recent sleep study did confirm the diagnosis of obstructive sleep apnea. OSA is overall mild, but worth treating to see if she feels better after treatment (and in light of planning weight loss surgery in the near future). To that end I recommend treatment for this in the form of autoPAP, which means, that we don't have to bring her back for a second sleep study with CPAP, but will let her try an autoPAP machine at home, through a DME company (of her choice, or as per insurance requirement). The DME representative will educate her on how to use the machine, how to put the mask on, etc. I have placed an order in the chart. Please send referral, talk to patient, send report to referring MD. We will need a FU in sleep clinic for 10 weeks post-PAP set up, please arrange that with me or one of our NPs. Thanks,   Huston Foley, MD, PhD [facility])

## 2018-05-19 NOTE — Telephone Encounter (Signed)
I called pt to discuss her sleep study results. No answer, left a message asking her to call me back. 

## 2018-05-21 NOTE — Telephone Encounter (Signed)
I called pt. I advised pt that Dr. Frances Furbish reviewed their sleep study results and found that pt has mild osa. Dr. Frances Furbish recommends that pt start an auto pap. I explained the difference between an auto pap and a cpap, and the benefits of each. Pt asked why she was not having a cpap titration study, even though she is agreeable to starting an auto pap. I reviewed PAP compliance expectations with the pt. Pt is agreeable to starting an auto-PAP. I advised pt that an order will be sent to a DME, [facility], and [facility] will call the pt within about one week after they file with the pt's insurance. [facility] will show the pt how to use the machine, fit for masks, and troubleshoot the auto-PAP if needed. A follow up appt was made for insurance purposes with Dr. Frances Furbish on 08/05/2018 at 11:30am. Pt verbalized understanding to arrive 15 minutes early and bring their auto-PAP. A letter with all of this information in it will be mailed to the pt as a reminder. I verified with the pt that the address we have on file is correct. Pt verbalized understanding of results. Pt had no questions at this time but was encouraged to call back if questions arise.

## 2018-05-29 ENCOUNTER — Institutional Professional Consult (permissible substitution): Payer: Self-pay | Admitting: Neurology

## 2018-05-29 ENCOUNTER — Ambulatory Visit (INDEPENDENT_AMBULATORY_CARE_PROVIDER_SITE_OTHER): Payer: Self-pay | Admitting: Physician Assistant

## 2018-05-29 ENCOUNTER — Ambulatory Visit: Payer: 59 | Admitting: Psychiatry

## 2018-05-30 NOTE — Telephone Encounter (Signed)
Received this notice from [facility]: "I have Called and LVM 05/21/2018, 05/28/2018 and again today 05/29/2018. No return calls as of yet. If Patient does call back I can recreate the Cpap Sales order and get her scheduled and will update you but wanted to notify you that as of right now we are Voiding this order until we hear from the patient. Thank you in advance!"

## 2018-06-02 ENCOUNTER — Encounter: Payer: 59 | Attending: Surgery | Admitting: Registered"

## 2018-06-02 ENCOUNTER — Ambulatory Visit: Payer: Self-pay | Admitting: Registered"

## 2018-06-02 DIAGNOSIS — Z713 Dietary counseling and surveillance: Secondary | ICD-10-CM | POA: Insufficient documentation

## 2018-06-02 DIAGNOSIS — E669 Obesity, unspecified: Secondary | ICD-10-CM | POA: Diagnosis not present

## 2018-06-02 NOTE — Progress Notes (Signed)
  Pre-Operative Nutrition Class:  Appt start time: 8:15   End time:  9:15.  Patient was seen on 06/02/2018 for Pre-Operative Bariatric Surgery Education at [facility].   Surgery date: TBD Surgery type: Sleeve Start weight at [facility]: 322.5 Weight today: 328.8   Samples given per MNT protocol. Patient educated on appropriate usage: Bariatric Advantage Multivitamin Lot # [id number] Exp: 06/2019  Bariatric Advantage Calcium Citrate Lot # [id number] Exp:01/09/2019  Bariatric Advantage Calcium Citrate Lot # [id number] Exp:05/03/2019  Bariatric Advantage Calcium Citrate Lot # [id number] Exp:01/30/2019  Unjury Protein Shake Lot # [id number] Exp: 11/16/2018   The following the learning objectives were met by the patient during this course:  Identify Pre-Op Dietary Goals and will begin 2 weeks pre-operatively  Identify appropriate sources of fluids and proteins   State protein recommendations and appropriate sources pre and post-operatively  Identify Post-Operative Dietary Goals and will follow for 2 weeks post-operatively  Identify appropriate multivitamin and calcium sources  Describe the need for physical activity post-operatively and will follow MD recommendations  State when to call healthcare provider regarding medication questions or post-operative complications  Handouts given during class include:  Pre-Op Bariatric Surgery Diet Handout  Protein Shake Handout  Post-Op Bariatric Surgery Nutrition Handout  BELT Program Information Flyer  Support Group Information Flyer  [facility] Bariatric Supplements Price List  Follow-Up Plan: Patient will follow-up at [facility] 2 weeks post operatively for diet advancement per MD.

## 2018-06-03 ENCOUNTER — Encounter (INDEPENDENT_AMBULATORY_CARE_PROVIDER_SITE_OTHER): Payer: Self-pay

## 2018-06-03 ENCOUNTER — Ambulatory Visit (INDEPENDENT_AMBULATORY_CARE_PROVIDER_SITE_OTHER): Payer: 59 | Admitting: Family Medicine

## 2018-06-03 ENCOUNTER — Ambulatory Visit (INDEPENDENT_AMBULATORY_CARE_PROVIDER_SITE_OTHER): Payer: 59

## 2018-06-03 ENCOUNTER — Ambulatory Visit (INDEPENDENT_AMBULATORY_CARE_PROVIDER_SITE_OTHER): Payer: Self-pay | Admitting: Orthopaedic Surgery

## 2018-06-03 ENCOUNTER — Ambulatory Visit (INDEPENDENT_AMBULATORY_CARE_PROVIDER_SITE_OTHER): Payer: Self-pay | Admitting: Physician Assistant

## 2018-06-03 VITALS — BP 123/81 | HR 98 | Ht 63.5 in | Wt 329.0 lb

## 2018-06-03 DIAGNOSIS — S99922A Unspecified injury of left foot, initial encounter: Secondary | ICD-10-CM

## 2018-06-03 DIAGNOSIS — S92512A Displaced fracture of proximal phalanx of left lesser toe(s), initial encounter for closed fracture: Secondary | ICD-10-CM

## 2018-06-03 DIAGNOSIS — Z23 Encounter for immunization: Secondary | ICD-10-CM | POA: Diagnosis not present

## 2018-06-03 DIAGNOSIS — S92515A Nondisplaced fracture of proximal phalanx of left lesser toe(s), initial encounter for closed fracture: Secondary | ICD-10-CM | POA: Diagnosis not present

## 2018-06-03 DIAGNOSIS — X501XXA Overexertion from prolonged static or awkward postures, initial encounter: Secondary | ICD-10-CM

## 2018-06-03 DIAGNOSIS — S92412A Displaced fracture of proximal phalanx of left great toe, initial encounter for closed fracture: Secondary | ICD-10-CM | POA: Diagnosis not present

## 2018-06-03 NOTE — Patient Instructions (Addendum)
Thank you for coming in today. Buddy tape the toes.  Use the post op shoe for a 1-3 weeks.  Wean out of shoe when able.  On recheck with Dr Cyndi LennertAleanxer in about 4-6 weeks make sure to get an exam of the foot.    How to Buddy Tape Buddy taping refers to taping an injured finger or toe to an uninjured finger or toe that is next to it. This protects the injured finger or toe and keeps it from moving while the injury heals. You may buddy tape a finger or toe if you have a minor sprain. Your health care provider may buddy tape your finger or toe if you have a sprain, dislocation, or fracture. You may be told to replace your buddy taping as needed. What are the risks? Generally, buddy taping is safe. However, problems may occur, such as:  Skin injury or infection.  Reduced blood flow to the finger or toe.  Skin reaction to the tape.  Do not buddy tape your toe if you have diabetes. Do not buddy tape if you know that you have an allergy to adhesives or surgical tape. How to buddy tape Before Buddy Taping Try to reduce any pain and swelling with rest, icing, and elevation:  Avoid any activity that causes pain.  Raise (elevate) your hand or foot above the level of your heart while you are sitting or lying down.  If directed, apply ice to the injured area: ? Put ice in a plastic bag. ? Place a towel between your skin and the bag. ? Leave the ice on for 20 minutes, 2-3 times per day.  Buddy Taping Procedure  Clean and dry your finger or toe as told by your health care provider.  Place a gauze pad or a piece of cloth or cotton between your injured finger or toe and the uninjured finger or toe.  Use tape to wrap around both fingers or toes so your injured finger or toe is secured to the uninjured finger or toe. ? The tape should be snug, but not tight. ? Make sure the ends of the piece of tape overlap. ? Avoid placing tape directly over the joint.  Change the tape and the padding as told  by your health care provider. Remove and replace the tape or padding if it becomes loose, worn, dirty, or wet. After Buddy Taping  Take over-the-counter and prescription medicines only as told by your health care provider.  Return to your normal activities as told by your health care provider. Ask your health care provider what activities are safe for you.  Watch the buddy-taped area and always remove buddy taping if: ? Your pain gets worse. ? Your fingers turn pale or blue. ? Your skin becomes irritated. Contact a health care provider if:  You have pain, swelling, or bruising that lasts longer than three days.  You have a fever.  Your skin is red, cracked, or irritated. Get help right away if:  The injured area becomes cold, numb, or pale.  You have severe pain, swelling, bruising, or loss of movement in your finger or toe.  Your finger or toe changes shape (deformity). This information is not intended to replace advice given to you by your health care provider. Make sure you discuss any questions you have with your health care provider. Document Released: 10/04/2004 Document Revised: 02/02/2016 Document Reviewed: 01/19/2015 Elsevier Interactive Patient Education  [employer]2018 Elsevier Inc.

## 2018-06-03 NOTE — Progress Notes (Signed)
Michaela Holland is a 37 y.o. female who [facility] today for toe pain.   She injured her left little toe yesterday when she stepped on a shoe in her home. She says her toe was sticking out at a 45 degree angle to the side. She reduced the toe herself before coming in to clinic. She is worried about treatment of the toe because she has knee problems and does not want to aggravate her knee. She has not tried any oral pain medication.     ROS:  As above  Exam:  BP 123/81   Pulse 98   Ht 5' 3.5" (1.613 m)   Wt (!) 329 lb (149.2 kg)   LMP 05/09/2018   BMI 57.37 kg/m  General: Well Developed, well nourished, and in no acute distress.  Neuro/Psych: Alert and oriented x3, extra-ocular muscles intact, able to move all 4 extremities, sensation grossly intact. Skin: Warm and dry, no rashes noted.  Respiratory: Not using accessory muscles, speaking in full sentences, trachea midline.  Cardiovascular: Pulses palpable, no extremity edema. Abdomen: Does not appear distended. Left fifth toe: mildly erythematous. Swollen. No obvious deformities.  Tender to palpation along the proximal phalanx  Capillary refill and sensation are intact distally.     Lab and Radiology Results X-ray images left fifth toe personally independently reviewed and revealed a nondisplaced transverse fraction at the base of the proximal phalanx.  No other acute changes present in the foot. Await formal radiology review    Assessment and Plan: 37 y.o. female with left fifth proximal phalanx fracture. Based on her history she had a severe angulation of her toe that she reduced herself quite well. The toe looks well aligned both grossly and on x ray. There is however a fracture in her fifth proximal phalanx, however formal radiology report is still pending. The plan will be to use buddy taping for 6-8 weeks and weight bear as tolerated as well as postop shoe.  She can  also take ibuprofen or aleve for pain if needed.  Recheck x-ray in about 4 weeks or sooner if needed.  Flu vaccine given today prior to discharge.  Orders Placed This Encounter  Procedures  . DG Foot Complete Left    Standing Status:   Future    Number of Occurrences:   1    Standing Expiration Date:   08/04/2019    Order Specific Question:   Reason for Exam (SYMPTOM  OR DIAGNOSIS REQUIRED)    Answer:   PINKY TOE INJURY    Order Specific Question:   Is patient pregnant?    Answer:   No    Order Specific Question:   Preferred imaging location?    [facility]    Order Specific Question:   Radiology Contrast Protocol - do NOT remove file path    Answer:   [email or web address]s.pdf  . Pneumococcal polysaccharide vaccine 23-valent greater than or equal to 2yo subcutaneous/IM  . Flu Vaccine QUAD 36+ mos IM   No orders of the defined types were placed in this encounter.   Historical information moved to improve visibility of documentation.  Past Medical History:  Diagnosis Date  . Anxiety   . Back pain   . Chronic pain of left knee   . Depression   . Diabetes mellitus without complication (HCC)    denies this diagnosis   . Family history of adverse reaction to anesthesia    per  patient ; father was having abdominal surgery and had to be placed on medicine to keep his blood pressure up; he never had any issues with low BP before the surgery "   . Gallbladder problem   . GERD (gastroesophageal reflux disease)   . Gout    believes only occurred once in feet;   . Hypertension    denies this diagnosis   . Hypothyroidism   . Joint pain   . Obesity   . Osteoarthritis   . Palpitations   . PCOS (polycystic ovarian syndrome)   . PONV (postoperative nausea and vomiting)    with c section; no issues with followwing procedures   . Seasonal allergies   . Seizures (HCC)    has had "pre-seizure" activity in the brain but deneis any  actual seizures   . Sleep apnea    does not currently use due to insurance   . Vitamin D deficiency    Past Surgical History:  Procedure Laterality Date  . CESAREAN SECTION    . CHOLECYSTECTOMY    . ESOPHAGOGASTRODUODENOSCOPY  2001 and 2018  . GANGLION CYST EXCISION Left 2014   foot  . KNEE ARTHROSCOPY Left 06/28/2017   Procedure: LEFT KNEE ARTHROSCOPY with debridement;  Surgeon: Kathryne Hitch, MD;  Location: [facility];  Service: Orthopedics;  Laterality: Left;   Social History   Tobacco Use  . Smoking status: Never Smoker  . Smokeless tobacco: Never Used  Substance Use Topics  . Alcohol use: Never    Frequency: Never   family history includes Anxiety disorder in her father; Asthma in her other; Cancer in her father; Depression in her father and mother; Hyperlipidemia in her father and mother; Hypertension in her father; Hypothyroidism in her mother and sister; Obesity in her father and mother; Polycystic ovary syndrome in her mother and sister.  Medications: Current Outpatient Medications  Medication Sig Dispense Refill  . acetaminophen-codeine (TYLENOL #3) 300-30 MG tablet Take 1-2 tablets by mouth every 8 (eight) hours as needed for moderate pain. 50 tablet 0  . albuterol (PROVENTIL HFA;VENTOLIN HFA) 108 (90 Base) MCG/ACT inhaler Inhale 1-2 puffs into the lungs every 4 (four) hours as needed for wheezing or shortness of breath (bronchospasm). 1 Inhaler 0  . Black Pepper-Turmeric (TURMERIC CURCUMIN) 01-999 MG CAPS Take 1 capsule by mouth daily.    . Brexpiprazole (REXULTI) 1 MG TABS Take 1 tablet (1 mg total) by mouth every morning. 90 tablet 0  . citalopram (CELEXA) 40 MG tablet Take 1 tablet (40 mg total) by mouth daily. 90 tablet 0  . clonazePAM (KLONOPIN) 0.5 MG tablet Take 0.5-1 tablets (0.25-0.5 mg total) by mouth 2 (two) times daily as needed for anxiety (#30 for 90 days.). 30 tablet 0  . Cyanocobalamin 2000 MCG TBCR Take 1 tablet (2,000 mcg total) by mouth daily.  30 tablet 0  . cyclobenzaprine (FLEXERIL) 10 MG tablet Take by mouth.    . diclofenac (VOLTAREN) 75 MG EC tablet TAKE 1 TABLET BY MOUTH 2 TIMES DAILY. 60 tablet 1  . EPINEPHrine 0.3 mg/0.3 mL IJ SOAJ injection Inject into the muscle.    . gabapentin (NEURONTIN) 300 MG capsule One tab PO qHS for a week, then BID for a week, then TID. May double weekly to a max of 3,600mg /day 180 capsule 3  . Glucosamine-Chondroit-Vit C-Mn (GLUCOSAMINE CHONDR 1500 COMPLX PO) Take 1 capsule by mouth daily.    Marland Kitchen ibuprofen (ADVIL,MOTRIN) 200 MG tablet Take 800 mg by mouth every 8 (eight)  hours as needed (for pain.).    Marland Kitchen levothyroxine (SYNTHROID, LEVOTHROID) 175 MCG tablet Take 1 tablet (175 mcg total) by mouth daily before breakfast. 90 tablet 3  . meclizine (ANTIVERT) 25 MG tablet Take 1 tablet (25 mg total) by mouth 3 (three) times daily as needed for dizziness. 15 tablet 1  . metFORMIN (GLUCOPHAGE-XR) 500 MG 24 hr tablet Take 1 tablet (500 mg total) by mouth at bedtime. (Patient taking differently: Take 1,000 mg by mouth at bedtime. ) 90 tablet 3  . mirabegron ER (MYRBETRIQ) 25 MG TB24 tablet Take 1 tablet (25 mg total) by mouth daily. 30 tablet 3  . Nystatin POWD Apply liberally to affected area 2 times per day 1 Bottle 11  . omeprazole (PRILOSEC) 40 MG capsule TAKE 1 CAPSULE BY MOUTH EVERY EVENING. 30 capsule 1  . Prenatal MV-Min-FA-Omega-3 (PRENATAL GUMMIES/DHA & FA) 0.4-32.5 MG CHEW Chew by mouth.    . ranitidine (ZANTAC) 150 MG tablet Take 150 mg by mouth daily as needed for heartburn.     . Vitamin D, Ergocalciferol, (DRISDOL) 50000 units CAPS capsule TAKE 1 CAPSULE BY MOUTH EVERY 7 DAYS 4 capsule 0   No current facility-administered medications for this visit.    Allergies  Allergen Reactions  . Sulfa Antibiotics Hives and Shortness Of Breath  . Food     Allergy to walnuts, pecans, [place] nuts, hazel nuts (she can eat other nuts).  . Other     Nuts severe reaction difficulty breathing , throat  swelling; mostly tree nuts but not all tree nuts       Discussed warning signs or symptoms. Please see discharge instructions. Patient expresses understanding.  I personally was present and performed or re-performed the history, physical exam and medical decision-making activities of this service and have verified that the service and findings are accurately documented in the student's note. ___________________________________________ Clementeen Graham M.D., ABFM., CAQSM. Primary Care and Sports Medicine Adjunct Instructor of Family Medicine  [employer]

## 2018-06-04 ENCOUNTER — Encounter: Payer: Self-pay | Admitting: Family Medicine

## 2018-06-04 DIAGNOSIS — S92515A Nondisplaced fracture of proximal phalanx of left lesser toe(s), initial encounter for closed fracture: Secondary | ICD-10-CM | POA: Insufficient documentation

## 2018-06-04 DIAGNOSIS — F900 Attention-deficit hyperactivity disorder, predominantly inattentive type: Secondary | ICD-10-CM | POA: Diagnosis not present

## 2018-06-04 DIAGNOSIS — G4733 Obstructive sleep apnea (adult) (pediatric): Secondary | ICD-10-CM | POA: Diagnosis not present

## 2018-06-04 DIAGNOSIS — F3341 Major depressive disorder, recurrent, in partial remission: Secondary | ICD-10-CM | POA: Diagnosis not present

## 2018-06-04 MED FILL — METHYLPHENIDATE 10 MG TAB: 10 | 30 days supply | Qty: 90 | Fill #0

## 2018-06-04 MED FILL — CITALOPRAM HBR 40 MG TABLET: 40 | 90 days supply | Qty: 90 | Fill #0

## 2018-06-05 MED FILL — OMEPRAZOLE 40 MG CPDR: 40 | 30 days supply | Qty: 30 | Fill #1

## 2018-06-05 MED FILL — MYRBETRIQ ER 25 MG TABLET: 25 | 30 days supply | Qty: 30 | Fill #0

## 2018-06-05 MED FILL — DICLOFENAC SOD EC 75 MG TAB: 75 | 30 days supply | Qty: 60 | Fill #1

## 2018-06-10 ENCOUNTER — Ambulatory Visit (INDEPENDENT_AMBULATORY_CARE_PROVIDER_SITE_OTHER): Payer: 59 | Admitting: Psychiatry

## 2018-06-10 ENCOUNTER — Encounter (INDEPENDENT_AMBULATORY_CARE_PROVIDER_SITE_OTHER): Payer: Self-pay | Admitting: Orthopaedic Surgery

## 2018-06-10 ENCOUNTER — Ambulatory Visit (INDEPENDENT_AMBULATORY_CARE_PROVIDER_SITE_OTHER): Payer: 59 | Admitting: Orthopaedic Surgery

## 2018-06-10 DIAGNOSIS — F509 Eating disorder, unspecified: Secondary | ICD-10-CM

## 2018-06-10 DIAGNOSIS — M1712 Unilateral primary osteoarthritis, left knee: Secondary | ICD-10-CM

## 2018-06-10 MED ORDER — LIDOCAINE HCL 1 % IJ SOLN
3.0000 mL | INTRAMUSCULAR | Status: AC | PRN
  Administered 2018-06-10: 3 mL

## 2018-06-10 MED ORDER — METHYLPREDNISOLONE ACETATE 40 MG/ML IJ SUSP
40.0000 mg | INTRAMUSCULAR | Status: AC | PRN
  Administered 2018-06-10: 40 mg via INTRA_ARTICULAR

## 2018-06-10 MED ORDER — ACETAMINOPHEN-CODEINE #3 300-30 MG PO TABS: 1.0000 | ORAL_TABLET | Freq: Three times a day (TID) | ORAL | 0 refills | Status: DC | PRN

## 2018-06-10 MED FILL — ACETAMINOPHEN/COD #3 TABLET: 300-30 | 8 days supply | Qty: 50 | Fill #0

## 2018-06-10 NOTE — Progress Notes (Signed)
   Procedure Note  Patient: Michaela Holland             Date of Birth: 08-Dec-1980           MRN: [id number]             Visit Date: 06/10/2018 HPI: Michaela Holland returns today for follow-up of her left knee.  She continues to have pain in the knee states is unchanged.  It limits the amount of time she can walk.  Unfortunately she is injured her left fifth toe states she fractured it was buddy taped and she is in a postop shoe.  She is been unable to go to the pool since the injury to her foot.  She does have upcoming weight loss surgery with Michaela Holland either November or December.  She is been told to stop her NSAIDs.  She currently taking Neurontin to [place].  She is asking for refill on her Tylenol 3 which was prescribed to her back in July and this will be her first refill.  Physical exam: Left knee no abnormal warmth erythema or effusion.  Good range of motion of the knee.  Procedures: Visit Diagnoses: Unilateral primary osteoarthritis, left knee  Large Joint Inj on 06/10/2018 10:48 AM Indications: pain Details: 22 G 1.5 in needle, anterolateral approach  Arthrogram: No  Medications: 3 mL lidocaine 1 %; 40 mg methylPREDNISolone acetate 40 MG/ML Outcome: tolerated well, no immediate complications Procedure, treatment alternatives, risks and benefits explained, specific risks discussed. Consent was given by the patient. Immediately prior to procedure a time out was called to verify the correct patient, procedure, equipment, support staff and site/side marked as required. Patient was prepped and draped in the usual sterile fashion.    Plan: She is given a refill her Tylenol 3 she will use these sparingly.  See her back in about 3 months for reevaluation of the left knee.  She may benefit from supplemental versus cortisone injection at that time.  If she does wish to proceed with a supplemental injection she is told to call our office in early December so this can be available for her at her  appointment.  Questions are encouraged and answered at length.

## 2018-06-11 ENCOUNTER — Ambulatory Visit: Payer: Self-pay | Admitting: Neurology

## 2018-06-25 ENCOUNTER — Ambulatory Visit (INDEPENDENT_AMBULATORY_CARE_PROVIDER_SITE_OTHER): Payer: Self-pay | Admitting: Orthopaedic Surgery

## 2018-07-01 ENCOUNTER — Ambulatory Visit: Payer: Self-pay | Admitting: Osteopathic Medicine

## 2018-07-02 MED FILL — ONDANSETRON ODT 4 MG TABLET: 4 | 3 days supply | Qty: 15 | Fill #0

## 2018-07-02 MED FILL — PANTOPRAZOLE SOD DR 40 MG T: 40 | 30 days supply | Qty: 30 | Fill #0

## 2018-07-03 DIAGNOSIS — F3341 Major depressive disorder, recurrent, in partial remission: Secondary | ICD-10-CM | POA: Diagnosis not present

## 2018-07-03 DIAGNOSIS — F900 Attention-deficit hyperactivity disorder, predominantly inattentive type: Secondary | ICD-10-CM | POA: Diagnosis not present

## 2018-07-04 DIAGNOSIS — G4733 Obstructive sleep apnea (adult) (pediatric): Secondary | ICD-10-CM | POA: Diagnosis not present

## 2018-07-04 MED FILL — oxyCODONE HCL 5 MG/5ML SOLN: 5 | 2 days supply | Qty: 120 | Fill #0

## 2018-07-07 ENCOUNTER — Telehealth: Payer: Self-pay | Admitting: Skilled Nursing Facility1

## 2018-07-07 NOTE — Telephone Encounter (Signed)
Pt called with questions Dietitian returned her call.  LVM

## 2018-07-09 ENCOUNTER — Telehealth: Payer: Self-pay | Admitting: Skilled Nursing Facility1

## 2018-07-09 NOTE — Telephone Encounter (Signed)
Pt called stating she is having trouble tolerating the protein shakes. Pt states she has had a history of diarrehea not really knowing what causes it.   Dietitian worked with pt for options of protein shakes and to ensure she is getting in enough fluid to make up for the diarrhea.

## 2018-07-11 ENCOUNTER — Telehealth: Payer: Self-pay | Admitting: Osteopathic Medicine

## 2018-07-11 NOTE — Telephone Encounter (Signed)
Left vm msg for pt regarding provider's note. Direct call back info provided.

## 2018-07-11 NOTE — Telephone Encounter (Signed)
Please call patient: I received some paperwork from [employer] company regarding disability forms to be filled out.  I think this may actually need to go to one of her surgeons, I am not sure if she is requesting disability for knee surgery or gastric bypass.  Can we please call the patient and see what she is requesting disability for and which surgeon we should forward the paperwork to?

## 2018-07-11 NOTE — Progress Notes (Signed)
10-22-17 (Epic) EKG  04-28-18 (Epic) CXR

## 2018-07-11 NOTE — Patient Instructions (Addendum)
Michaela Holland  07/11/2018   Your procedure is scheduled on: 07-22-18    Report to [facility] Main  Entrance    Report to Admitting at 5:30 AM    Call this number if you have problems the morning of surgery [phone]     Remember: MORNING OF SURGERY DRINK:  1SHAKE BEFORE YOU LEAVE HOME, DRINK ALL OF THE SHAKE AT ONE TIME.   NO SOLID FOOD AFTER 600 PM THE NIGHT BEFORE YOUR SURGERY. YOU MAY DRINK CLEAR FLUIDS. THE SHAKE YOU DRINK BEFORE YOU LEAVE HOME WILL BE THE LAST FLUIDS YOU DRINK BEFORE SURGERY.  PAIN IS EXPECTED AFTER SURGERY AND WILL NOT BE COMPLETELY ELIMINATED. AMBULATION AND TYLENOL WILL HELP REDUCE INCISIONAL AND GAS PAIN. MOVEMENT IS KEY!  YOU ARE EXPECTED TO BE OUT OF BED WITHIN 4 HOURS OF ADMISSION TO YOUR PATIENT ROOM.  SITTING IN THE RECLINER THROUGHOUT THE DAY IS IMPORTANT FOR DRINKING FLUIDS AND MOVING GAS THROUGHOUT THE GI TRACT.  COMPRESSION STOCKINGS SHOULD BE WORN [facility] STAY UNLESS YOU ARE WALKING.   INCENTIVE SPIROMETER SHOULD BE USED EVERY HOUR WHILE AWAKE TO DECREASE POST-OPERATIVE COMPLICATIONS SUCH AS PNEUMONIA.  WHEN DISCHARGED HOME, IT IS IMPORTANT TO CONTINUE TO WALK EVERY HOUR AND USE THE INCENTIVE SPIROMETER EVERY HOUR.            CLEAR LIQUID DIET   Foods Allowed                                                                     Foods Excluded  Coffee and tea, regular and decaf                             liquids that you cannot  Plain Jell-O in any flavor                                             see through such as: Fruit ices (not with fruit pulp)                                     milk, soups, orange juice  Iced Popsicles                                    All solid food Carbonated beverages, regular and diet                                    Cranberry, grape and apple juices Sports drinks like Gatorade Lightly seasoned clear broth or consume(fat free) Sugar, honey syrup  Sample  Menu Breakfast                                Lunch  Supper Cranberry juice                    Beef broth                            Chicken broth Jell-O                                     Grape juice                           Apple juice Coffee or tea                        Jell-O                                      Popsicle                                                Coffee or tea                        Coffee or tea  _____________________________________________________________________      BRUSH YOUR TEETH MORNING OF SURGERY AND RINSE YOUR MOUTH OUT, NO CHEWING GUM CANDY OR MINTS.     Take these medicines the morning of surgery with A SIP OF WATER: Citalopram (Celexa), and Levothyroxine (Synthroid), Clonazepam (Klonopin), Gabapentin (Neurontin), and Methylphenidate (Ritalin)                                 You may not have any metal on your body including hair pins and              piercings  Do not wear jewelry, make-up, lotions, powders or perfumes, deodorant             Do not wear nail polish.  Do not shave  48 hours prior to surgery.               Do not bring valuables to the hospital. [facility] IS NOT             RESPONSIBLE   FOR VALUABLES.  Contacts, dentures or bridgework may not be worn into surgery.  Leave suitcase in the car. After surgery it may be brought to your room.    Special Instructions: N/A              Please read over the following fact sheets you were given: _____________________________________________________________________  [facility] - Preparing for Surgery Before surgery, you can play an important role.  Because skin is not sterile, your skin needs to be as free of germs as possible.  You can reduce the number of germs on your skin by washing with CHG (chlorahexidine gluconate) soap before surgery.  CHG is an antiseptic cleaner which kills germs and bonds with the skin to continue killing germs even after  washing. Please DO NOT use if you have an allergy to CHG or antibacterial soaps.  If your skin  becomes reddened/irritated stop using the CHG and inform your nurse when you arrive at Short Stay. Do not shave (including legs and underarms) for at least 48 hours prior to the first CHG shower.  You may shave your face/neck. Please follow these instructions carefully:  1.  Shower with CHG Soap the night before surgery and the  morning of Surgery.  2.  If you choose to wash your hair, wash your hair first as usual with your  normal  shampoo.  3.  After you shampoo, rinse your hair and body thoroughly to remove the  shampoo.                           4.  Use CHG as you would any other liquid soap.  You can apply chg directly  to the skin and wash                       Gently with a scrungie or clean washcloth.  5.  Apply the CHG Soap to your body ONLY FROM THE NECK DOWN.   Do not use on face/ open                           Wound or open sores. Avoid contact with eyes, ears mouth and genitals (private parts).                       Wash face,  Genitals (private parts) with your normal soap.             6.  Wash thoroughly, paying special attention to the area where your surgery  will be performed.  7.  Thoroughly rinse your body with warm water from the neck down.  8.  DO NOT shower/wash with your normal soap after using and rinsing off  the CHG Soap.                9.  Pat yourself dry with a clean towel.            10.  Wear clean pajamas.            11.  Place clean sheets on your bed the night of your first shower and do not  sleep with pets. Day of Surgery : Do not apply any lotions/deodorants the morning of surgery.  Please wear clean clothes to the hospital/surgery center.  FAILURE TO FOLLOW THESE INSTRUCTIONS MAY RESULT IN THE CANCELLATION OF YOUR SURGERY PATIENT SIGNATURE_________________________________  NURSE  SIGNATURE__________________________________  ________________________________________________________________________  Michaela Holland  An incentive spirometer is a tool that can help keep your lungs clear and active. This tool measures how well you are filling your lungs with each breath. Taking long deep breaths may help reverse or decrease the chance of developing breathing (pulmonary) problems (especially infection) following:  A long period of time when you are unable to move or be active. BEFORE THE PROCEDURE   If the spirometer includes an indicator to show your best effort, your nurse or respiratory therapist will set it to a desired goal.  If possible, sit up straight or lean slightly forward. Try not to slouch.  Hold the incentive spirometer in an upright position. INSTRUCTIONS FOR USE  1. Sit on the edge of your bed if possible, or sit up as far as you can in bed or on a chair. 2. Hold the  incentive spirometer in an upright position. 3. Breathe out normally. 4. Place the mouthpiece in your mouth and seal your lips tightly around it. 5. Breathe in slowly and as deeply as possible, raising the piston or the ball toward the top of the column. 6. Hold your breath for 3-5 seconds or for as long as possible. Allow the piston or ball to fall to the bottom of the column. 7. Remove the mouthpiece from your mouth and breathe out normally. 8. Rest for a few seconds and repeat Steps 1 through 7 at least 10 times every 1-2 hours when you are awake. Take your time and take a few normal breaths between deep breaths. 9. The spirometer may include an indicator to show your best effort. Use the indicator as a goal to work toward during each repetition. 10. After each set of 10 deep breaths, practice coughing to be sure your lungs are clear. If you have an incision (the cut made at the time of surgery), support your incision when coughing by placing a pillow or rolled up towels firmly against  it. Once you are able to get out of bed, walk around indoors and cough well. You may stop using the incentive spirometer when instructed by your caregiver.  RISKS AND COMPLICATIONS  Take your time so you do not get dizzy or light-headed.  If you are in pain, you may need to take or ask for pain medication before doing incentive spirometry. It is harder to take a deep breath if you are having pain. AFTER USE  Rest and breathe slowly and easily.  It can be helpful to keep track of a log of your progress. Your caregiver can provide you with a simple table to help with this. If you are using the spirometer at home, follow these instructions: SEEK MEDICAL CARE IF:   You are having difficultly using the spirometer.  You have trouble using the spirometer as often as instructed.  Your pain medication is not giving enough relief while using the spirometer.  You develop fever of 100.5 F (38.1 C) or higher. SEEK IMMEDIATE MEDICAL CARE IF:   You cough up bloody sputum that had not been present before.  You develop fever of 102 F (38.9 C) or greater.  You develop worsening pain at or near the incision site. MAKE SURE YOU:   Understand these instructions.  Will watch your condition.  Will get help right away if you are not doing well or get worse. Document Released: 01/07/2007 Document Revised: 11/19/2011 Document Reviewed: 03/10/2007 [employer] Patient Information 2014 [employer], [place].   ________________________________________________________________________  WHAT IS A BLOOD TRANSFUSION? Blood Transfusion Information  A transfusion is the replacement of blood or some of its parts. Blood is made up of multiple cells which provide different functions.  Red blood cells carry oxygen and are used for blood loss replacement.  White blood cells fight against infection.  Platelets control bleeding.  Plasma helps clot blood.  Other blood products are available for specialized  needs, such as hemophilia or other clotting disorders. BEFORE THE TRANSFUSION  Who gives blood for transfusions?   Healthy volunteers who are fully evaluated to make sure their blood is safe. This is blood bank blood. Transfusion therapy is the safest it has ever been in the practice of medicine. Before blood is taken from a donor, a complete history is taken to make sure that person has no history of diseases nor engages in risky social behavior (examples are intravenous drug use  or sexual activity with multiple partners). The donor's travel history is screened to minimize risk of transmitting infections, such as malaria. The donated blood is tested for signs of infectious diseases, such as HIV and hepatitis. The blood is then tested to be sure it is compatible with you in order to minimize the chance of a transfusion reaction. If you or a relative donates blood, this is often done in anticipation of surgery and is not appropriate for emergency situations. It takes many days to process the donated blood. RISKS AND COMPLICATIONS Although transfusion therapy is very safe and saves many lives, the main dangers of transfusion include:   Getting an infectious disease.  Developing a transfusion reaction. This is an allergic reaction to something in the blood you were given. Every precaution is taken to prevent this. The decision to have a blood transfusion has been considered carefully by your caregiver before blood is given. Blood is not given unless the benefits outweigh the risks. AFTER THE TRANSFUSION  Right after receiving a blood transfusion, you will usually feel much better and more energetic. This is especially true if your red blood cells have gotten low (anemic). The transfusion raises the level of the red blood cells which carry oxygen, and this usually causes an energy increase.  The nurse administering the transfusion will monitor you carefully for complications. HOME CARE INSTRUCTIONS   No special instructions are needed after a transfusion. You may find your energy is better. Speak with your caregiver about any limitations on activity for underlying diseases you may have. SEEK MEDICAL CARE IF:   Your condition is not improving after your transfusion.  You develop redness or irritation at the intravenous (IV) site. SEEK IMMEDIATE MEDICAL CARE IF:  Any of the following symptoms occur over the next 12 hours:  Shaking chills.  You have a temperature by mouth above 102 F (38.9 C), not controlled by medicine.  Chest, back, or muscle pain.  People around you feel you are not acting correctly or are confused.  Shortness of breath or difficulty breathing.  Dizziness and fainting.  You get a rash or develop hives.  You have a decrease in urine output.  Your urine turns a dark color or changes to pink, red, or brown. Any of the following symptoms occur over the next 10 days:  You have a temperature by mouth above 102 F (38.9 C), not controlled by medicine.  Shortness of breath.  Weakness after normal activity.  The white part of the eye turns yellow (jaundice).  You have a decrease in the amount of urine or are urinating less often.  Your urine turns a dark color or changes to pink, red, or brown. Document Released: 08/24/2000 Document Revised: 11/19/2011 Document Reviewed: 04/12/2008 [facility] Patient Information 2014 [place], [place].  _______________________________________________________________________

## 2018-07-15 ENCOUNTER — Other Ambulatory Visit: Payer: Self-pay

## 2018-07-15 ENCOUNTER — Encounter (HOSPITAL_COMMUNITY): Payer: Self-pay

## 2018-07-15 ENCOUNTER — Encounter (HOSPITAL_COMMUNITY)
Admission: RE | Admit: 2018-07-15 | Discharge: 2018-07-15 | Disposition: A | Discharge status: Home / Self Care | Payer: 59 | Source: Ambulatory Visit | Attending: Surgery | Admitting: Surgery

## 2018-07-15 DIAGNOSIS — Z01812 Encounter for preprocedural laboratory examination: Secondary | ICD-10-CM | POA: Insufficient documentation

## 2018-07-15 LAB — CBC WITH DIFFERENTIAL/PLATELET
Abs Immature Granulocytes: 0.06 10*3/uL (ref 0.00–0.07)
BASOS PCT: 0 %
Basophils Absolute: 0 10*3/uL (ref 0.0–0.1)
EOS ABS: 0.1 10*3/uL (ref 0.0–0.5)
Eosinophils Relative: 1 %
HCT: 42.2 % (ref 36.0–46.0)
HEMOGLOBIN: 13 g/dL (ref 12.0–15.0)
Immature Granulocytes: 1 %
Lymphocytes Relative: 17 %
Lymphs Abs: 1.6 10*3/uL (ref 0.7–4.0)
MCH: 26.6 pg (ref 26.0–34.0)
MCHC: 30.8 g/dL (ref 30.0–36.0)
MCV: 86.5 fL (ref 80.0–100.0)
MONOS PCT: 7 %
Monocytes Absolute: 0.6 10*3/uL (ref 0.1–1.0)
NEUTROS PCT: 74 %
Neutro Abs: 6.8 10*3/uL (ref 1.7–7.7)
Platelets: 345 10*3/uL (ref 150–400)
RBC: 4.88 MIL/uL (ref 3.87–5.11)
RDW: 13 % (ref 11.5–15.5)
WBC: 9.1 10*3/uL (ref 4.0–10.5)
nRBC: 0 % (ref 0.0–0.2)

## 2018-07-15 LAB — COMPREHENSIVE METABOLIC PANEL
ALK PHOS: 50 U/L (ref 38–126)
ALT: 16 U/L (ref 0–44)
AST: 19 U/L (ref 15–41)
Albumin: 4.1 g/dL (ref 3.5–5.0)
Anion gap: 9 (ref 5–15)
BUN: 12 mg/dL (ref 6–20)
CALCIUM: 9.2 mg/dL (ref 8.9–10.3)
CO2: 28 mmol/L (ref 22–32)
Chloride: 100 mmol/L (ref 98–111)
Creatinine, Ser: 0.6 mg/dL (ref 0.44–1.00)
GFR calc Af Amer: >60 mL/min (ref >60)
GFR calc non Af Amer: >60 mL/min (ref >60)
Glucose, Bld: 97 mg/dL (ref 70–99)
Potassium: 4.4 mmol/L (ref 3.5–5.1)
SODIUM: 137 mmol/L (ref 135–145)
Total Bilirubin: 0.5 mg/dL (ref 0.3–1.2)
Total Protein: 7.4 g/dL (ref 6.5–8.1)

## 2018-07-15 NOTE — Progress Notes (Signed)
Pt reports that she is taking Metformin for PCOS, not diabetes. She also reports that the Diabetes comorbidity was entered in error. She sees her primary provider tomorrow, and will see if she can have the diagnosis removed.

## 2018-07-16 ENCOUNTER — Telehealth: Payer: Self-pay | Admitting: Registered"

## 2018-07-16 ENCOUNTER — Ambulatory Visit (INDEPENDENT_AMBULATORY_CARE_PROVIDER_SITE_OTHER): Payer: 59 | Admitting: Osteopathic Medicine

## 2018-07-16 ENCOUNTER — Encounter: Payer: Self-pay | Admitting: Osteopathic Medicine

## 2018-07-16 LAB — ABO/RH: ABO/RH(D): AB POS

## 2018-07-16 NOTE — Progress Notes (Signed)
HPI: Michaela Holland is a 37 y.o. female who  has a past medical history of Anxiety, Back pain, Chronic pain of left knee, Depression, Diabetes mellitus without complication ([place]), Family history of adverse reaction to anesthesia, Gallbladder problem, GERD (gastroesophageal reflux disease), Gout, Hypertension, Hypothyroidism, Joint pain, Obesity, Osteoarthritis, Palpitations, PCOS (polycystic ovarian syndrome), PONV (postoperative nausea and vomiting), Seasonal allergies, Seizures ([place]), Sleep apnea, and Vitamin D deficiency.  she presents to [facility] today, 07/16/18,  for chief complaint of: Risk evaluation for surgery  Michaela Holland is seen 07/16/18 for peri-operative risk stratification.    Michaela Holland denies chest pain at rest or with exertion, denies dyspnea at rest or with exertion,denies lower extremity edema, denies claudication or palpitations.  Patient has negative history of ischemic heart disease, CHF, CVD, diabetes, alcohol or drug abuse, recent anticoagulation or antithrombotic use  Patient has negative history of undergoing cardiac stress test or catheterization, coronary revascularization.   Patient reports being able to achieve normal MET of activity during routine ADL, limited by knee pain     Past medical, surgical, social and family history reviewed:  Patient Active Problem List   Diagnosis Date Noted  . Closed nondisplaced fracture of proximal phalanx of lesser toe of left foot 06/04/2018  . Overactive bladder 03/28/2018  . Urinary incontinence, nocturnal enuresis 03/28/2018  . Unilateral primary osteoarthritis, left knee 02/24/2018  . H/O arthroscopy of left knee 02/24/2018  . Baker cyst, left 01/01/2018  . Other fatigue 10/22/2017  . Shortness of breath on exertion 10/22/2017  . Vitamin D deficiency 10/22/2017  . S/P arthroscopy of left knee 07/18/2017  . Acute pain of left knee 06/17/2017  . Meniscal cyst, left 06/17/2017   . Cyst of lateral meniscus of left knee 05/08/2017  . Anterior cruciate ligament sprain 05/08/2017  . Morbid obesity ([place]) 05/08/2017  . History of obstructive sleep apnea 04/09/2017  . Hypothyroidism 04/09/2017  . PCOS (polycystic ovarian syndrome) 04/09/2017  . Anxiety 04/09/2017  . Chronic pain of left knee 04/09/2017  . Gastroesophageal reflux disease with esophagitis 04/09/2017    Past Surgical History:  Procedure Laterality Date  . CESAREAN SECTION    . CHOLECYSTECTOMY    . ESOPHAGOGASTRODUODENOSCOPY  2001 and 2018  . GANGLION CYST EXCISION Left 2014   foot  . KNEE ARTHROSCOPY Left 06/28/2017   Procedure: LEFT KNEE ARTHROSCOPY with debridement;  Surgeon: Mcarthur Rossetti, MD;  Location: [facility];  Service: Orthopedics;  Laterality: Left;    Social History   Tobacco Use  . Smoking status: Never Smoker  . Smokeless tobacco: Never Used  Substance Use Topics  . Alcohol use: Never    Frequency: Never    Family History  Problem Relation Age of Onset  . Polycystic ovary syndrome Mother   . Hypothyroidism Mother   . Hyperlipidemia Mother   . Depression Mother   . Obesity Mother   . Polycystic ovary syndrome Sister   . Hypothyroidism Sister   . Hypertension Father   . Hyperlipidemia Father   . Cancer Father   . Depression Father   . Anxiety disorder Father   . Obesity Father   . Asthma Other      Current medication list and allergy/intolerance information reviewed:    Current Outpatient Medications  Medication Sig Dispense Refill  . acetaminophen-codeine (TYLENOL #3) 300-30 MG tablet Take 1-2 tablets by mouth every 8 (eight) hours as needed for moderate pain. (Patient taking differently: Take 1 tablet by  mouth every 8 (eight) hours as needed for moderate pain. ) 50 tablet 0  . albuterol (PROVENTIL HFA;VENTOLIN HFA) 108 (90 Base) MCG/ACT inhaler Inhale 1-2 puffs into the lungs every 4 (four) hours as needed for wheezing or shortness of breath (bronchospasm).  1 Inhaler 0  . Brexpiprazole (REXULTI) 1 MG TABS Take 1 tablet (1 mg total) by mouth every morning. 90 tablet 0  . CANNABIDIOL PO Take 1 Dose by mouth 2 (two) times daily. CBD OIL    . citalopram (CELEXA) 40 MG tablet Take 1 tablet (40 mg total) by mouth daily. 90 tablet 0  . clonazePAM (KLONOPIN) 0.5 MG tablet Take 0.5-1 tablets (0.25-0.5 mg total) by mouth 2 (two) times daily as needed for anxiety (#30 for 90 days.). 30 tablet 0  . diclofenac (VOLTAREN) 75 MG EC tablet TAKE 1 TABLET BY MOUTH 2 TIMES DAILY. (Patient taking differently: Take 75 mg by mouth 2 (two) times daily as needed (for pain.). ) 60 tablet 1  . EPINEPHrine 0.3 mg/0.3 mL IJ SOAJ injection Inject 0.3 mg into the muscle daily as needed (for anaphylactic reactions.).     Marland Kitchen gabapentin (NEURONTIN) 300 MG capsule One tab PO qHS for a week, then BID for a week, then TID. May double weekly to a max of 3,666m/day (Patient taking differently: Take 600 mg by mouth 2 (two) times daily. Morning & afternoon.) 180 capsule 3  . ibuprofen (ADVIL,MOTRIN) 200 MG tablet Take 800 mg by mouth every 8 (eight) hours as needed (for pain.).    .Marland Kitchenlevothyroxine (SYNTHROID, LEVOTHROID) 175 MCG tablet Take 1 tablet (175 mcg total) by mouth daily before breakfast. 90 tablet 3  . meclizine (ANTIVERT) 25 MG tablet Take 1 tablet (25 mg total) by mouth 3 (three) times daily as needed for dizziness. 15 tablet 1  . metFORMIN (GLUCOPHAGE-XR) 500 MG 24 hr tablet Take 1 tablet (500 mg total) by mouth at bedtime. (Patient taking differently: Take 1,000 mg by mouth at bedtime. ) 90 tablet 3  . methylphenidate (RITALIN) 10 MG tablet Take 15 mg by mouth 2 (two) times daily.  0  . mirabegron ER (MYRBETRIQ) 25 MG TB24 tablet Take 1 tablet (25 mg total) by mouth daily. (Patient taking differently: Take 25 mg by mouth at bedtime. ) 30 tablet 3  . Multiple Vitamins-Minerals (ADULT GUMMY PO) Take 2 tablets by mouth at bedtime.    .Marland KitchenNystatin POWD Apply liberally to affected area 2  times per day (Patient taking differently: Apply 1 g topically 2 (two) times daily as needed (for rash/irritated skin.). ) 1 Bottle 11  . omeprazole (PRILOSEC) 40 MG capsule TAKE 1 CAPSULE BY MOUTH EVERY EVENING. (Patient taking differently: Take 40 mg by mouth daily as needed (acid reflux/indigestion.). ) 30 capsule 1  . ondansetron (ZOFRAN-ODT) 4 MG disintegrating tablet Take 4 mg by mouth 3 (three) times daily as needed for nausea/vomiting.  0  . oxyCODONE (ROXICODONE) 5 MG/5ML solution Take 5-10 mLs by mouth every 6 (six) hours as needed for pain.  0  . pantoprazole (PROTONIX) 40 MG tablet Take 40 mg by mouth daily.  2  . Turmeric 500 MG CAPS Take 1,000 mg by mouth at bedtime.    . vitamin B-12 (CYANOCOBALAMIN) 500 MCG tablet Take 500 mcg by mouth daily.    .Marland KitchenVITAMIN D PO Take by mouth.    . Vitamin D, Ergocalciferol, (DRISDOL) 50000 units CAPS capsule TAKE 1 CAPSULE BY MOUTH EVERY 7 DAYS (Patient not taking: Reported on 07/16/2018)  4 capsule 0   No current facility-administered medications for this visit.     Allergies  Allergen Reactions  . Sulfa Antibiotics Hives and Shortness Of Breath  . Other     Nuts severe reaction difficulty breathing , throat swelling; mostly tree nuts but not all tree nuts   . Adhesive [Tape] Rash      Review of Systems:  Constitutional:  No  fever, no chills, No recent illness, No unintentional weight changes. No significant fatigue.   HEENT: No  headache, no vision change  Cardiac: No  chest pain, No  pressure, No palpitations, No  Orthopnea  Respiratory:  No  shortness of breath. No  Cough  Gastrointestinal: No  abdominal pain, No  nausea, No  vomiting,  No  blood in stool, No  diarrhea, No  constipation   Musculoskeletal: No new myalgia/arthralgia  Skin: No  Rash  Hem/Onc: No  easy bruising/bleeding,  Endocrine: No cold intolerance,  No heat intolerance. No polyuria/polydipsia/polyphagia   Neurologic: No  weakness, No   dizziness  Psychiatric: No  concerns with depression, No  concerns with anxiety, No sleep problems, No mood problems  Exam:  BP 130/66 (BP Location: Left Arm, Patient Position: Sitting, Cuff Size: Large)   Pulse 78   Temp 98.4 F (36.9 C) (Oral)   Wt (!) 316 lb 12.8 oz (143.7 kg)   LMP 07/07/2018 (Exact Date)   BMI 54.81 kg/m   Constitutional: VS see above. General Appearance: alert, well-developed, well-nourished, NAD  Eyes: Normal lids and conjunctive, non-icteric sclera  Ears, Nose, Mouth, Throat: MMM, Normal external inspection ears/nares/mouth/lips/gums.  Pharynx/tonsils no erythema, no exudate.  Neck: No masses, trachea midline. No thyroid enlargement. No tenderness/mass appreciated. No lymphadenopathy  Respiratory: Normal respiratory effort. no wheeze, no rhonchi, no rales  Cardiovascular: S1/S2 normal, no murmur, no rub/gallop auscultated. RRR. No lower extremity edema.  Gastrointestinal: Nontender, no masses  Musculoskeletal: Gait normal.   Neurological: Normal balance/coordination. No tremor. No cranial nerve deficit on limited exam. Motor and sensation intact and symmetric. Cerebellar reflexes intact.    Skin: warm, dry, intact. No rash/ulcer.   Psychiatric: Normal judgment/insight. Normal mood and affect. Oriented x3.    Results for orders placed or performed during the hospital encounter of 07/15/18 (from the past 72 hour(s))  CBC WITH DIFFERENTIAL     Status: None   Collection Time: 07/15/18  2:00 PM  Result Value Ref Range   WBC 9.1 4.0 - 10.5 K/uL   RBC 4.88 3.87 - 5.11 MIL/uL   Hemoglobin 13.0 12.0 - 15.0 g/dL   HCT 42.2 36.0 - 46.0 %   MCV 86.5 80.0 - 100.0 fL   MCH 26.6 26.0 - 34.0 pg   MCHC 30.8 30.0 - 36.0 g/dL   RDW 13.0 11.5 - 15.5 %   Platelets 345 150 - 400 K/uL   nRBC 0.0 0.0 - 0.2 %   Neutrophils Relative % 74 %   Neutro Abs 6.8 1.7 - 7.7 K/uL   Lymphocytes Relative 17 %   Lymphs Abs 1.6 0.7 - 4.0 K/uL   Monocytes Relative 7 %    Monocytes Absolute 0.6 0.1 - 1.0 K/uL   Eosinophils Relative 1 %   Eosinophils Absolute 0.1 0.0 - 0.5 K/uL   Basophils Relative 0 %   Basophils Absolute 0.0 0.0 - 0.1 K/uL   Immature Granulocytes 1 %   Abs Immature Granulocytes 0.06 0.00 - 0.07 K/uL    Comment: Performed at [facility], [address] [place], [place] [address]  Comprehensive metabolic panel     Status: None   Collection Time: 07/15/18  2:00 PM  Result Value Ref Range   Sodium 137 135 - 145 mmol/L   Potassium 4.4 3.5 - 5.1 mmol/L   Chloride 100 98 - 111 mmol/L   CO2 28 22 - 32 mmol/L   Glucose, Bld 97 70 - 99 mg/dL   BUN 12 6 - 20 mg/dL   Creatinine, Ser 0.60 0.44 - 1.00 mg/dL   Calcium 9.2 8.9 - 10.3 mg/dL   Total Protein 7.4 6.5 - 8.1 g/dL   Albumin 4.1 3.5 - 5.0 g/dL   AST 19 15 - 41 U/L   ALT 16 0 - 44 U/L   Alkaline Phosphatase 50 38 - 126 U/L   Total Bilirubin 0.5 0.3 - 1.2 mg/dL   GFR calc non Af Amer >60 >60 mL/min   GFR calc Af Amer >60 >60 mL/min    Comment: (NOTE) The eGFR has been calculated using the CKD EPI equation. This calculation has not been validated in all clinical situations. eGFR's persistently <60 mL/min signify possible Chronic Kidney Disease.    Anion gap 9 5 - 15    Comment: Performed at [facility], [place] [address], [place], [place] [address]  ABO/Rh     Status: None   Collection Time: 07/15/18  2:00 PM  Result Value Ref Range   ABO/RH(D)      AB POS Performed at [facility], [place] [address] [place], [place] [address]   Type and screen     Status: None   Collection Time: 07/15/18  2:32 PM  Result Value Ref Range   ABO/RH(D) AB POS    Antibody Screen NEG    Sample Expiration 07/29/2018    Extend sample reason      NO TRANSFUSIONS OR PREGNANCY IN THE PAST 3 MONTHS Performed at [facility], [place] [address], [place], [place] [address]     No results found.   ASSESSMENT/PLAN:   Morbid  obesity ([place]) - planning for bariatric surgery     Patient's cardiac risk factors include obesity, OSA.   According to the RCR I, this number of risk factors stratify as the patient to class 0 which carries with it a 3.9 percent risk of major cardiovascular complications such as MI, CHF, malignant arrhythmia.    The above risks, along with the risk of perioperative stroke, were discussed with the patient and her family, in light of the benefits of possible surgery. Patient and family wish to proceed with the operation. This assessment was conveyed to the surgery team.    Visit summary with medication list and pertinent instructions was printed for patient to review. All questions at time of visit were answered - patient instructed to contact office with any additional concerns. ER/RTC precautions were reviewed with the patient.   Follow-up plan: Annual when due 01/2019     Please note: voice recognition software was used to produce this document, and typos may escape review. Please contact Dr. Sheppard Coil for any needed clarifications.

## 2018-07-16 NOTE — Telephone Encounter (Signed)
Pt has questions about pre-op diet. RD returned pt's phone call. Pt states she is unable to talk at current moment but will email questions/concerns to RD.

## 2018-07-17 ENCOUNTER — Encounter: Payer: Self-pay | Admitting: Osteopathic Medicine

## 2018-07-21 MED ORDER — BUPIVACAINE LIPOSOME 1.3 % IJ SUSP
20.0000 mL | Freq: Once | INTRAMUSCULAR | Status: DC
  Filled 2018-07-21: qty 20

## 2018-07-21 MED FILL — MYRBETRIQ ER 25 MG TABLET: 25 | 30 days supply | Qty: 30 | Fill #1

## 2018-07-21 NOTE — H&P (Signed)
Chief Complaint:  Morbid obesity for sleeve gastrectomy  History of Present Illness:  Michaela Holland is an 37 y.o. 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  Informed consent completed.  UGI was normal.m  She has DM and joint pain.    Past Medical History:  Diagnosis Date  . Anxiety   . Back pain   . Chronic pain of left knee   . Depression   . Diabetes mellitus without complication (HCC)    denies this diagnosis   . Family history of adverse reaction to anesthesia    per patient ; father was having abdominal surgery and had to be placed on medicine to keep his blood pressure up; he never had any issues with low BP before the surgery "   . Gallbladder problem   . GERD (gastroesophageal reflux disease)   . Gout    believes only occurred once in feet;   . Hypertension    denies this diagnosis   . Hypothyroidism   . Joint pain   . Obesity   . Osteoarthritis   . Palpitations   . PCOS (polycystic ovarian syndrome)   . PONV (postoperative nausea and vomiting)    with c section; no issues with followwing procedures   . Seasonal allergies   . Seizures (HCC)    has had "pre-seizure" activity in the brain but deneis any actual seizures   . Sleep apnea    does not currently use due to insurance   . Vitamin D deficiency     Past Surgical History:  Procedure Laterality Date  . CESAREAN SECTION    . CHOLECYSTECTOMY    . ESOPHAGOGASTRODUODENOSCOPY  2001 and 2018  . GANGLION CYST EXCISION Left 2014   foot  . KNEE ARTHROSCOPY Left 06/28/2017   Procedure: LEFT KNEE ARTHROSCOPY with debridement;  Surgeon: Kathryne Hitch, MD;  Location: [facility];  Service: Orthopedics;  Laterality: Left;    Current Facility-Administered Medications  Medication Dose Route Frequency Provider Last Rate Last Dose  . [START ON 07/22/2018] bupivacaine liposome (EXPAREL) 1.3 % injection 266 mg  20 mL  Infiltration Once Cindi Carbon, [facility]       Current Outpatient Medications  Medication Sig Dispense Refill  . acetaminophen-codeine (TYLENOL #3) 300-30 MG tablet Take 1-2 tablets by mouth every 8 (eight) hours as needed for moderate pain. (Patient taking differently: Take 1 tablet by mouth every 8 (eight) hours as needed for moderate pain. ) 50 tablet 0  . albuterol (PROVENTIL HFA;VENTOLIN HFA) 108 (90 Base) MCG/ACT inhaler Inhale 1-2 puffs into the lungs every 4 (four) hours as needed for wheezing or shortness of breath (bronchospasm). 1 Inhaler 0  . CANNABIDIOL PO Take 1 Dose by mouth 2 (two) times daily. CBD OIL    . citalopram (CELEXA) 40 MG tablet Take 1 tablet (40 mg total) by mouth daily. 90 tablet 0  . clonazePAM (KLONOPIN) 0.5 MG tablet Take 0.5-1 tablets (0.25-0.5 mg total) by mouth 2 (two) times daily as needed for anxiety (#30 for 90 days.). 30 tablet 0  . EPINEPHrine 0.3 mg/0.3 mL IJ SOAJ injection Inject 0.3 mg into the muscle daily as needed (for anaphylactic reactions.).     Marland Kitchen gabapentin (NEURONTIN) 300 MG capsule One tab PO qHS for a week, then BID for a week, then TID. May double weekly to a max of 3,600mg /day (Patient taking differently: Take  600 mg by mouth 2 (two) times daily. Morning & afternoon.) 180 capsule 3  . levothyroxine (SYNTHROID, LEVOTHROID) 175 MCG tablet Take 1 tablet (175 mcg total) by mouth daily before breakfast. 90 tablet 3  . meclizine (ANTIVERT) 25 MG tablet Take 1 tablet (25 mg total) by mouth 3 (three) times daily as needed for dizziness. 15 tablet 1  . metFORMIN (GLUCOPHAGE-XR) 500 MG 24 hr tablet Take 1 tablet (500 mg total) by mouth at bedtime. (Patient taking differently: Take 1,000 mg by mouth at bedtime. ) 90 tablet 3  . methylphenidate (RITALIN) 10 MG tablet Take 15 mg by mouth 2 (two) times daily.  0  . mirabegron ER (MYRBETRIQ) 25 MG TB24 tablet Take 1 tablet (25 mg total) by mouth daily. (Patient taking differently: Take 25 mg by mouth at bedtime. )  30 tablet 3  . Multiple Vitamins-Minerals (ADULT GUMMY PO) Take 2 tablets by mouth at bedtime.    Marland Kitchen omeprazole (PRILOSEC) 40 MG capsule TAKE 1 CAPSULE BY MOUTH EVERY EVENING. (Patient taking differently: Take 40 mg by mouth daily as needed (acid reflux/indigestion.). ) 30 capsule 1  . Turmeric 500 MG CAPS Take 1,000 mg by mouth at bedtime.    . vitamin B-12 (CYANOCOBALAMIN) 500 MCG tablet Take 500 mcg by mouth daily.    . Brexpiprazole (REXULTI) 1 MG TABS Take 1 tablet (1 mg total) by mouth every morning. 90 tablet 0  . diclofenac (VOLTAREN) 75 MG EC tablet TAKE 1 TABLET BY MOUTH 2 TIMES DAILY. (Patient taking differently: Take 75 mg by mouth 2 (two) times daily as needed (for pain.). ) 60 tablet 1  . ibuprofen (ADVIL,MOTRIN) 200 MG tablet Take 800 mg by mouth every 8 (eight) hours as needed (for pain.).    Marland Kitchen Nystatin POWD Apply liberally to affected area 2 times per day (Patient taking differently: Apply 1 g topically 2 (two) times daily as needed (for rash/irritated skin.). ) 1 Bottle 11  . ondansetron (ZOFRAN-ODT) 4 MG disintegrating tablet Take 4 mg by mouth 3 (three) times daily as needed for nausea/vomiting.  0  . oxyCODONE (ROXICODONE) 5 MG/5ML solution Take 5-10 mLs by mouth every 6 (six) hours as needed for pain.  0  . pantoprazole (PROTONIX) 40 MG tablet Take 40 mg by mouth daily.  2  . VITAMIN D PO Take by mouth.    . Vitamin D, Ergocalciferol, (DRISDOL) 50000 units CAPS capsule TAKE 1 CAPSULE BY MOUTH EVERY 7 DAYS (Patient not taking: Reported on 07/16/2018) 4 capsule 0   Sulfa antibiotics; Other; and Adhesive [tape] Family History  Problem Relation Age of Onset  . Polycystic ovary syndrome Mother   . Hypothyroidism Mother   . Hyperlipidemia Mother   . Depression Mother   . Obesity Mother   . Polycystic ovary syndrome Sister   . Hypothyroidism Sister   . Hypertension Father   . Hyperlipidemia Father   . Cancer Father   . Depression Father   . Anxiety disorder Father   .  Obesity Father   . Asthma Other    Social History:   reports that she has never smoked. She has never used smokeless tobacco. She reports that she does not drink alcohol or use drugs.   REVIEW OF SYSTEMS : Negative except for see problem list  Physical Exam:   Last menstrual period 07/07/2018. There is no height or weight on file to calculate BMI.  Gen:  WDWN WF NAD  Neurological: Alert and oriented to person, place, and  time. Motor and sensory function is grossly intact  Head: Normocephalic and atraumatic.  Eyes: Conjunctivae are normal. Pupils are equal, round, and reactive to light. No scleral icterus.  Neck: Normal range of motion. Neck supple. No tracheal deviation or thyromegaly present.  Cardiovascular:  SR without murmurs or gallops.  No carotid bruits Breast:  Not examined Respiratory: Effort normal.  No respiratory distress. No chest wall tenderness. Breath sounds normal.  No wheezes, rales or rhonchi.  Abdomen:  obese GU:  Pannus obscuring.  Musculoskeletal: Normal range of motion. Extremities are nontender. No cyanosis, edema or clubbing noted Lymphadenopathy: No cervical, preauricular, postauricular or axillary adenopathy is present Skin: Skin is warm and dry. No rash noted. No diaphoresis. No erythema. No pallor. Pscyh: Normal mood and affect. Behavior is normal. Judgment and thought content normal.   LABORATORY RESULTS: No results found for this or any previous visit (from the past 48 hour(s)).   RADIOLOGY RESULTS: No results found.  Problem List: Patient Active Problem List   Diagnosis Date Noted  . Closed nondisplaced fracture of proximal phalanx of lesser toe of left foot 06/04/2018  . Overactive bladder 03/28/2018  . Urinary incontinence, nocturnal enuresis 03/28/2018  . Unilateral primary osteoarthritis, left knee 02/24/2018  . H/O arthroscopy of left knee 02/24/2018  . Baker cyst, left 01/01/2018  . Other fatigue 10/22/2017  . Shortness of breath on  exertion 10/22/2017  . Vitamin D deficiency 10/22/2017  . S/P arthroscopy of left knee 07/18/2017  . Acute pain of left knee 06/17/2017  . Meniscal cyst, left 06/17/2017  . Cyst of lateral meniscus of left knee 05/08/2017  . Anterior cruciate ligament sprain 05/08/2017  . Morbid obesity (HCC) 05/08/2017  . History of obstructive sleep apnea 04/09/2017  . Hypothyroidism 04/09/2017  . PCOS (polycystic ovarian syndrome) 04/09/2017  . Anxiety 04/09/2017  . Chronic pain of left knee 04/09/2017  . Gastroesophageal reflux disease with esophagitis 04/09/2017    Assessment & Plan: Morbid obesity with BMI 55 for sleeve gastrectomy.      Matt B. Daphine Deutscher, MD, [facility] [phone] beeper [phone]  07/21/2018 3:07 PM

## 2018-07-22 ENCOUNTER — Encounter (HOSPITAL_COMMUNITY)
Admission: RE | Disposition: A | Discharge status: Home / Self Care | Payer: Self-pay | Source: Home / Self Care | Attending: Surgery

## 2018-07-22 ENCOUNTER — Other Ambulatory Visit: Payer: Self-pay

## 2018-07-22 ENCOUNTER — Inpatient Hospital Stay (HOSPITAL_COMMUNITY): Payer: 59 | Admitting: Anesthesiology

## 2018-07-22 ENCOUNTER — Inpatient Hospital Stay (HOSPITAL_COMMUNITY)
Admission: RE | Admit: 2018-07-22 | Discharge: 2018-07-23 | DRG: 621 | Disposition: A | Discharge status: Home / Self Care | Payer: 59 | Attending: Surgery | Admitting: Surgery

## 2018-07-22 ENCOUNTER — Encounter (HOSPITAL_COMMUNITY): Payer: Self-pay | Admitting: Anesthesiology

## 2018-07-22 DIAGNOSIS — Z8349 Family history of other endocrine, nutritional and metabolic diseases: Secondary | ICD-10-CM

## 2018-07-22 DIAGNOSIS — Z8249 Family history of ischemic heart disease and other diseases of the circulatory system: Secondary | ICD-10-CM | POA: Diagnosis not present

## 2018-07-22 DIAGNOSIS — G4733 Obstructive sleep apnea (adult) (pediatric): Secondary | ICD-10-CM | POA: Diagnosis present

## 2018-07-22 DIAGNOSIS — G8929 Other chronic pain: Secondary | ICD-10-CM | POA: Diagnosis present

## 2018-07-22 DIAGNOSIS — Z6841 Body Mass Index (BMI) 40.0 and over, adult: Secondary | ICD-10-CM

## 2018-07-22 DIAGNOSIS — M1712 Unilateral primary osteoarthritis, left knee: Secondary | ICD-10-CM | POA: Diagnosis present

## 2018-07-22 DIAGNOSIS — Z9884 Bariatric surgery status: Secondary | ICD-10-CM

## 2018-07-22 DIAGNOSIS — I10 Essential (primary) hypertension: Secondary | ICD-10-CM | POA: Diagnosis present

## 2018-07-22 DIAGNOSIS — Z79890 Hormone replacement therapy (postmenopausal): Secondary | ICD-10-CM | POA: Diagnosis not present

## 2018-07-22 DIAGNOSIS — E119 Type 2 diabetes mellitus without complications: Secondary | ICD-10-CM | POA: Diagnosis present

## 2018-07-22 DIAGNOSIS — Z7984 Long term (current) use of oral hypoglycemic drugs: Secondary | ICD-10-CM

## 2018-07-22 DIAGNOSIS — F329 Major depressive disorder, single episode, unspecified: Secondary | ICD-10-CM | POA: Diagnosis present

## 2018-07-22 DIAGNOSIS — E559 Vitamin D deficiency, unspecified: Secondary | ICD-10-CM | POA: Diagnosis present

## 2018-07-22 DIAGNOSIS — K219 Gastro-esophageal reflux disease without esophagitis: Secondary | ICD-10-CM | POA: Diagnosis present

## 2018-07-22 DIAGNOSIS — K449 Diaphragmatic hernia without obstruction or gangrene: Secondary | ICD-10-CM | POA: Diagnosis present

## 2018-07-22 DIAGNOSIS — E039 Hypothyroidism, unspecified: Secondary | ICD-10-CM | POA: Diagnosis present

## 2018-07-22 DIAGNOSIS — F419 Anxiety disorder, unspecified: Secondary | ICD-10-CM | POA: Diagnosis present

## 2018-07-22 DIAGNOSIS — K210 Gastro-esophageal reflux disease with esophagitis: Secondary | ICD-10-CM | POA: Diagnosis present

## 2018-07-22 HISTORY — PX: LAPAROSCOPIC GASTRIC SLEEVE RESECTION: SHX5895

## 2018-07-22 LAB — TYPE AND SCREEN
ABO/RH(D): AB POS
Antibody Screen: NEGATIVE

## 2018-07-22 LAB — CBC
HEMATOCRIT: 39.5 % (ref 36.0–46.0)
HEMOGLOBIN: 12.3 g/dL (ref 12.0–15.0)
MCH: 27.4 pg (ref 26.0–34.0)
MCHC: 31.1 g/dL (ref 30.0–36.0)
MCV: 88 fL (ref 80.0–100.0)
NRBC: 0 % (ref 0.0–0.2)
Platelets: 287 10*3/uL (ref 150–400)
RBC: 4.49 MIL/uL (ref 3.87–5.11)
RDW: 13.2 % (ref 11.5–15.5)
WBC: 11.9 10*3/uL — AB (ref 4.0–10.5)

## 2018-07-22 LAB — CREATININE, SERUM
Creatinine, Ser: 0.6 mg/dL (ref 0.44–1.00)
GFR calc Af Amer: >60 mL/min (ref >60)

## 2018-07-22 LAB — GLUCOSE, CAPILLARY: Glucose-Capillary: 111 mg/dL — ABNORMAL HIGH (ref 70–99)

## 2018-07-22 LAB — PREGNANCY, URINE: PREG TEST UR: NEGATIVE

## 2018-07-22 SURGERY — GASTRECTOMY, SLEEVE, LAPAROSCOPIC
Anesthesia: General

## 2018-07-22 MED ORDER — ONDANSETRON HCL 4 MG/2ML IJ SOLN
4.0000 mg | INTRAMUSCULAR | Status: DC | PRN
  Administered 2018-07-23: 4 mg via INTRAVENOUS
  Filled 2018-07-22: qty 2

## 2018-07-22 MED ORDER — HYDRALAZINE HCL 20 MG/ML IJ SOLN: 10.0000 mg | INTRAMUSCULAR | Status: DC | PRN

## 2018-07-22 MED ORDER — PROPOFOL 10 MG/ML IV BOLUS
INTRAVENOUS | Status: AC
  Filled 2018-07-22: qty 20

## 2018-07-22 MED ORDER — HEPARIN SODIUM (PORCINE) 5000 UNIT/ML IJ SOLN
5000.0000 [IU] | Freq: Three times a day (TID) | INTRAMUSCULAR | Status: DC
  Administered 2018-07-22 – 2018-07-23 (×3): 5000 [IU] via SUBCUTANEOUS
  Filled 2018-07-22 (×3): qty 1

## 2018-07-22 MED ORDER — BUPIVACAINE LIPOSOME 1.3 % IJ SUSP
INTRAMUSCULAR | Status: DC | PRN
  Administered 2018-07-22: 20 mL

## 2018-07-22 MED ORDER — SUGAMMADEX SODIUM 500 MG/5ML IV SOLN
INTRAVENOUS | Status: AC
  Filled 2018-07-22: qty 5

## 2018-07-22 MED ORDER — APREPITANT 40 MG PO CAPS
40.0000 mg | ORAL_CAPSULE | ORAL | Status: AC
  Administered 2018-07-22: 40 mg via ORAL
  Filled 2018-07-22: qty 1

## 2018-07-22 MED ORDER — LIDOCAINE 2% (20 MG/ML) 5 ML SYRINGE
INTRAMUSCULAR | Status: DC | PRN
  Administered 2018-07-22: 1.5 mg/kg/h via INTRAVENOUS

## 2018-07-22 MED ORDER — METOPROLOL TARTRATE 5 MG/5ML IV SOLN: 5.0000 mg | Freq: Four times a day (QID) | INTRAVENOUS | Status: DC | PRN

## 2018-07-22 MED ORDER — FENTANYL CITRATE (PF) 250 MCG/5ML IJ SOLN
INTRAMUSCULAR | Status: AC
  Filled 2018-07-22: qty 5

## 2018-07-22 MED ORDER — HEPARIN SODIUM (PORCINE) 5000 UNIT/ML IJ SOLN
5000.0000 [IU] | INTRAMUSCULAR | Status: AC
  Administered 2018-07-22: 5000 [IU] via SUBCUTANEOUS
  Filled 2018-07-22: qty 1

## 2018-07-22 MED ORDER — KETAMINE HCL 10 MG/ML IJ SOLN
INTRAMUSCULAR | Status: DC | PRN
  Administered 2018-07-22: 25 mg via INTRAVENOUS

## 2018-07-22 MED ORDER — ROCURONIUM BROMIDE 10 MG/ML (PF) SYRINGE
PREFILLED_SYRINGE | INTRAVENOUS | Status: AC
  Filled 2018-07-22: qty 10

## 2018-07-22 MED ORDER — FENTANYL CITRATE (PF) 100 MCG/2ML IJ SOLN
INTRAMUSCULAR | Status: AC
  Filled 2018-07-22: qty 2

## 2018-07-22 MED ORDER — CITALOPRAM HYDROBROMIDE 20 MG PO TABS
40.0000 mg | ORAL_TABLET | Freq: Every day | ORAL | Status: DC
  Administered 2018-07-23: 40 mg via ORAL
  Filled 2018-07-22: qty 2

## 2018-07-22 MED ORDER — DEXAMETHASONE SODIUM PHOSPHATE 10 MG/ML IJ SOLN
INTRAMUSCULAR | Status: AC
  Filled 2018-07-22: qty 1

## 2018-07-22 MED ORDER — SODIUM CHLORIDE 0.9 % IV SOLN
2.0000 g | INTRAVENOUS | Status: AC
  Administered 2018-07-22: 2 g via INTRAVENOUS
  Filled 2018-07-22: qty 2

## 2018-07-22 MED ORDER — PROMETHAZINE HCL 25 MG/ML IJ SOLN
INTRAMUSCULAR | Status: AC
  Filled 2018-07-22: qty 1

## 2018-07-22 MED ORDER — FENTANYL CITRATE (PF) 100 MCG/2ML IJ SOLN
INTRAMUSCULAR | Status: DC | PRN
  Administered 2018-07-22: 25 ug via INTRAVENOUS
  Administered 2018-07-22 (×2): 50 ug via INTRAVENOUS
  Administered 2018-07-22: 25 ug via INTRAVENOUS
  Administered 2018-07-22 (×2): 50 ug via INTRAVENOUS
  Administered 2018-07-22 (×5): 25 ug via INTRAVENOUS
  Administered 2018-07-22 (×2): 50 ug via INTRAVENOUS
  Administered 2018-07-22: 25 ug via INTRAVENOUS
  Administered 2018-07-22: 100 ug via INTRAVENOUS
  Administered 2018-07-22: 50 ug via INTRAVENOUS

## 2018-07-22 MED ORDER — CHLORHEXIDINE GLUCONATE CLOTH 2 % EX PADS: 6.0000 | MEDICATED_PAD | Freq: Once | CUTANEOUS | Status: DC

## 2018-07-22 MED ORDER — EPHEDRINE SULFATE-NACL 50-0.9 MG/10ML-% IV SOSY
PREFILLED_SYRINGE | INTRAVENOUS | Status: DC | PRN
  Administered 2018-07-22: 5 mg via INTRAVENOUS

## 2018-07-22 MED ORDER — ACETAMINOPHEN 500 MG PO TABS
1000.0000 mg | ORAL_TABLET | ORAL | Status: AC
  Administered 2018-07-22: 1000 mg via ORAL
  Filled 2018-07-22: qty 2

## 2018-07-22 MED ORDER — GABAPENTIN 300 MG PO CAPS
300.0000 mg | ORAL_CAPSULE | ORAL | Status: DC
  Filled 2018-07-22: qty 1

## 2018-07-22 MED ORDER — PREMIER PROTEIN SHAKE
2.0000 [oz_av] | ORAL | Status: DC
  Administered 2018-07-23 (×4): 2 [oz_av] via ORAL

## 2018-07-22 MED ORDER — DEXAMETHASONE SODIUM PHOSPHATE 10 MG/ML IJ SOLN
INTRAMUSCULAR | Status: DC | PRN
  Administered 2018-07-22: 4 mg via INTRAVENOUS

## 2018-07-22 MED ORDER — ONDANSETRON HCL 4 MG/2ML IJ SOLN
INTRAMUSCULAR | Status: AC
  Filled 2018-07-22: qty 2

## 2018-07-22 MED ORDER — OXYCODONE HCL 5 MG/5ML PO SOLN
5.0000 mg | ORAL | Status: DC | PRN
  Administered 2018-07-23 (×2): 5 mg via ORAL
  Filled 2018-07-22 (×2): qty 5

## 2018-07-22 MED ORDER — PROMETHAZINE HCL 25 MG/ML IJ SOLN
6.2500 mg | INTRAMUSCULAR | Status: DC | PRN
  Administered 2018-07-22: 12.5 mg via INTRAVENOUS

## 2018-07-22 MED ORDER — PROPOFOL 10 MG/ML IV BOLUS
INTRAVENOUS | Status: DC | PRN
  Administered 2018-07-22: 200 mg via INTRAVENOUS

## 2018-07-22 MED ORDER — LACTATED RINGERS IR SOLN
Status: DC | PRN
  Administered 2018-07-22: 1

## 2018-07-22 MED ORDER — LIDOCAINE 2% (20 MG/ML) 5 ML SYRINGE
INTRAMUSCULAR | Status: AC
  Filled 2018-07-22: qty 5

## 2018-07-22 MED ORDER — 0.9 % SODIUM CHLORIDE (POUR BTL) OPTIME
TOPICAL | Status: DC | PRN
  Administered 2018-07-22: 1000 mL

## 2018-07-22 MED ORDER — LEVOTHYROXINE SODIUM 75 MCG PO TABS
175.0000 ug | ORAL_TABLET | Freq: Every day | ORAL | Status: DC
  Administered 2018-07-23: 75 ug via ORAL
  Administered 2018-07-23: 100 ug via ORAL
  Filled 2018-07-22: qty 1

## 2018-07-22 MED ORDER — SCOPOLAMINE 1 MG/3DAYS TD PT72
1.0000 | MEDICATED_PATCH | TRANSDERMAL | Status: DC
  Administered 2018-07-22: 1.5 mg via TRANSDERMAL
  Filled 2018-07-22: qty 1

## 2018-07-22 MED ORDER — SUGAMMADEX SODIUM 500 MG/5ML IV SOLN
INTRAVENOUS | Status: DC | PRN
  Administered 2018-07-22: 500 mg via INTRAVENOUS

## 2018-07-22 MED ORDER — SODIUM CHLORIDE (PF) 0.9 % IJ SOLN
INTRAMUSCULAR | Status: AC
  Filled 2018-07-22: qty 50

## 2018-07-22 MED ORDER — MIDAZOLAM HCL 2 MG/2ML IJ SOLN
INTRAMUSCULAR | Status: AC
  Filled 2018-07-22: qty 2

## 2018-07-22 MED ORDER — LACTATED RINGERS IV SOLN
INTRAVENOUS | Status: DC
  Administered 2018-07-22: 10:00:00 via INTRAVENOUS
  Administered 2018-07-22: 1000 mL via INTRAVENOUS

## 2018-07-22 MED ORDER — FENTANYL CITRATE (PF) 100 MCG/2ML IJ SOLN
25.0000 ug | INTRAMUSCULAR | Status: DC | PRN
  Administered 2018-07-22 (×2): 25 ug via INTRAVENOUS

## 2018-07-22 MED ORDER — PANTOPRAZOLE SODIUM 40 MG IV SOLR
40.0000 mg | Freq: Every day | INTRAVENOUS | Status: DC
  Administered 2018-07-22: 40 mg via INTRAVENOUS
  Filled 2018-07-22: qty 40

## 2018-07-22 MED ORDER — ROCURONIUM BROMIDE 100 MG/10ML IV SOLN
INTRAVENOUS | Status: DC | PRN
  Administered 2018-07-22: 50 mg via INTRAVENOUS
  Administered 2018-07-22: 20 mg via INTRAVENOUS

## 2018-07-22 MED ORDER — KCL IN DEXTROSE-NACL 20-5-0.45 MEQ/L-%-% IV SOLN
INTRAVENOUS | Status: DC
  Administered 2018-07-22 – 2018-07-23 (×3): via INTRAVENOUS
  Filled 2018-07-22 (×3): qty 1000

## 2018-07-22 MED ORDER — ALBUTEROL SULFATE (2.5 MG/3ML) 0.083% IN NEBU: 3.0000 mL | INHALATION_SOLUTION | RESPIRATORY_TRACT | Status: DC | PRN

## 2018-07-22 MED ORDER — SODIUM CHLORIDE (PF) 0.9 % IJ SOLN
INTRAMUSCULAR | Status: DC | PRN
  Administered 2018-07-22: 30 mL

## 2018-07-22 MED ORDER — MIDAZOLAM HCL 5 MG/5ML IJ SOLN
INTRAMUSCULAR | Status: DC | PRN
  Administered 2018-07-22: 2 mg via INTRAVENOUS

## 2018-07-22 MED ORDER — EPHEDRINE 5 MG/ML INJ
INTRAVENOUS | Status: AC
  Filled 2018-07-22: qty 10

## 2018-07-22 MED ORDER — MORPHINE SULFATE (PF) 2 MG/ML IV SOLN
1.0000 mg | INTRAVENOUS | Status: DC | PRN
  Administered 2018-07-22 (×2): 2 mg via INTRAVENOUS
  Filled 2018-07-22 (×2): qty 1

## 2018-07-22 MED ORDER — KETAMINE HCL 10 MG/ML IJ SOLN
INTRAMUSCULAR | Status: AC
  Filled 2018-07-22: qty 1

## 2018-07-22 MED ORDER — LIDOCAINE HCL (CARDIAC) PF 100 MG/5ML IV SOSY
PREFILLED_SYRINGE | INTRAVENOUS | Status: DC | PRN
  Administered 2018-07-22: 30 mg via INTRAVENOUS

## 2018-07-22 MED ORDER — LIDOCAINE HCL 2 % IJ SOLN
INTRAMUSCULAR | Status: AC
  Filled 2018-07-22: qty 20

## 2018-07-22 MED ORDER — ACETAMINOPHEN 160 MG/5ML PO SOLN
650.0000 mg | Freq: Four times a day (QID) | ORAL | Status: DC
  Administered 2018-07-22 – 2018-07-23 (×3): 650 mg via ORAL
  Filled 2018-07-22 (×4): qty 20.3

## 2018-07-22 MED ORDER — ONDANSETRON HCL 4 MG/2ML IJ SOLN
INTRAMUSCULAR | Status: DC | PRN
  Administered 2018-07-22 (×2): 4 mg via INTRAVENOUS

## 2018-07-22 SURGICAL SUPPLY — 60 items
APPLICATOR COTTON TIP 6 STRL (MISCELLANEOUS) IMPLANT
APPLICATOR COTTON TIP 6IN STRL (MISCELLANEOUS)
APPLIER CLIP 5 13 M/L LIGAMAX5 (MISCELLANEOUS)
APPLIER CLIP ROT 10 11.4 M/L (STAPLE)
APPLIER CLIP ROT 13.4 12 LRG (CLIP)
BLADE SURG 15 STRL LF DISP TIS (BLADE) ×1 IMPLANT
BLADE SURG 15 STRL SS (BLADE) ×2
CABLE HIGH FREQUENCY MONO STRZ (ELECTRODE) ×3 IMPLANT
CLIP APPLIE 5 13 M/L LIGAMAX5 (MISCELLANEOUS) IMPLANT
CLIP APPLIE ROT 10 11.4 M/L (STAPLE) IMPLANT
CLIP APPLIE ROT 13.4 12 LRG (CLIP) IMPLANT
COVER WAND RF STERILE (DRAPES) IMPLANT
DERMABOND ADVANCED (GAUZE/BANDAGES/DRESSINGS) ×2
DERMABOND ADVANCED .7 DNX12 (GAUZE/BANDAGES/DRESSINGS) ×1 IMPLANT
DEVICE SUT QUICK LOAD TK 5 (STAPLE) ×4 IMPLANT
DEVICE SUT TI-KNOT TK 5X26 (MISCELLANEOUS) ×2 IMPLANT
DEVICE SUTURE ENDOST 10MM (ENDOMECHANICALS) ×3 IMPLANT
DEVICE TI KNOT TK5 (MISCELLANEOUS) ×1
DISSECTOR BLUNT TIP ENDO 5MM (MISCELLANEOUS) ×3 IMPLANT
ELECT REM PT RETURN 15FT ADLT (MISCELLANEOUS) ×3 IMPLANT
GAUZE SPONGE 4X4 12PLY STRL (GAUZE/BANDAGES/DRESSINGS) IMPLANT
GLOVE BIOGEL M 8.0 STRL (GLOVE) ×3 IMPLANT
GOWN STRL REUS W/TWL XL LVL3 (GOWN DISPOSABLE) ×12 IMPLANT
GRASPER SUT TROCAR 14GX15 (MISCELLANEOUS) ×3 IMPLANT
HANDLE STAPLE EGIA 4 XL (STAPLE) ×3 IMPLANT
HOVERMATT SINGLE USE (MISCELLANEOUS) ×3 IMPLANT
KIT BASIN OR (CUSTOM PROCEDURE TRAY) ×3 IMPLANT
MARKER SKIN DUAL TIP RULER LAB (MISCELLANEOUS) ×3 IMPLANT
NEEDLE SPNL 22GX3.5 QUINCKE BK (NEEDLE) ×3 IMPLANT
PACK UNIVERSAL I (CUSTOM PROCEDURE TRAY) ×3 IMPLANT
QUICK LOAD TK 5 (STAPLE) ×2
RELOAD TRI 45 ART MED THCK BLK (STAPLE) ×3 IMPLANT
RELOAD TRI 45 ART MED THCK PUR (STAPLE) ×3 IMPLANT
RELOAD TRI 60 ART MED THCK BLK (STAPLE) ×3 IMPLANT
RELOAD TRI 60 ART MED THCK PUR (STAPLE) ×9 IMPLANT
SCISSORS LAP 5X45 EPIX DISP (ENDOMECHANICALS) IMPLANT
SET IRRIG TUBING LAPAROSCOPIC (IRRIGATION / IRRIGATOR) ×3 IMPLANT
SHEARS HARMONIC ACE PLUS 45CM (MISCELLANEOUS) ×3 IMPLANT
SLEEVE ADV FIXATION 5X100MM (TROCAR) ×9 IMPLANT
SLEEVE GASTRECTOMY 36FR VISIGI (MISCELLANEOUS) ×3 IMPLANT
SOLUTION ANTI FOG 6CC (MISCELLANEOUS) ×3 IMPLANT
SPONGE LAP 18X18 RF (DISPOSABLE) ×3 IMPLANT
STAPLER VISISTAT 35W (STAPLE) ×3 IMPLANT
SUT MNCRL AB 4-0 PS2 18 (SUTURE) ×6 IMPLANT
SUT SURGIDAC NAB ES-9 0 48 120 (SUTURE) ×6 IMPLANT
SUT VICRYL 0 TIES 12 18 (SUTURE) ×3 IMPLANT
SYR 10ML ECCENTRIC (SYRINGE) ×3 IMPLANT
SYR 20CC LL (SYRINGE) ×3 IMPLANT
SYR 50ML LL SCALE MARK (SYRINGE) ×3 IMPLANT
TOWEL OR 17X26 10 PK STRL BLUE (TOWEL DISPOSABLE) ×6 IMPLANT
TOWEL OR NON WOVEN STRL DISP B (DISPOSABLE) ×3 IMPLANT
TRAY FOLEY MTR SLVR 16FR STAT (SET/KITS/TRAYS/PACK) IMPLANT
TROCAR ADV FIXATION 5X100MM (TROCAR) ×3 IMPLANT
TROCAR BLADELESS 15MM (ENDOMECHANICALS) ×3 IMPLANT
TROCAR BLADELESS OPT 5 100 (ENDOMECHANICALS) ×3 IMPLANT
TUBE CALIBRATION LAPBAND (TUBING) ×3 IMPLANT
TUBING CONNECTING 10 (TUBING) ×2 IMPLANT
TUBING CONNECTING 10' (TUBING) ×1
TUBING ENDO SMARTCAP (MISCELLANEOUS) ×3 IMPLANT
TUBING INSUF HEATED (TUBING) ×3 IMPLANT

## 2018-07-22 NOTE — Transfer of Care (Signed)
Immediate Anesthesia Transfer of Care Note  Patient: Michaela Holland  Procedure(s) Performed: LAPAROSCOPIC GASTRIC SLEEVE RESECTION WITH UPPER ENDO AND ERAS PATHWAY (N/A )  Patient Location: PACU  Anesthesia Type:General  Level of Consciousness: awake, alert  and oriented  Airway & Oxygen Therapy: Patient Spontanous Breathing and Patient connected to face mask oxygen  Post-op Assessment: Report given to RN and Post -op Vital signs reviewed and stable  Post vital signs: Reviewed and stable  Last Vitals:  Vitals Value Taken Time  BP 167/113 07/22/2018 10:10 AM  Temp    Pulse 96 07/22/2018 10:12 AM  Resp 17 07/22/2018 10:12 AM  SpO2 100 % 07/22/2018 10:12 AM  Vitals shown include unvalidated device data.  Last Pain:  Vitals:   07/22/18 0653  TempSrc:   PainSc: 0-No pain      Patients Stated Pain Goal: 4 (07/22/18 [id number])  Complications: No apparent anesthesia complications

## 2018-07-22 NOTE — Anesthesia Procedure Notes (Signed)
Procedure Name: Intubation Date/Time: 07/22/2018 7:40 AM Performed by: Garrel Ridgel, CRNA Pre-anesthesia Checklist: Patient identified, Emergency Drugs available, Suction available, Patient being monitored and Timeout performed Patient Re-evaluated:Patient Re-evaluated prior to induction Oxygen Delivery Method: Circle system utilized Preoxygenation: Pre-oxygenation with 100% oxygen Induction Type: IV induction Ventilation: Two handed mask ventilation required Laryngoscope Size: Mac and 3 Grade View: Grade I Tube type: Oral Tube size: 7.0 mm Number of attempts: 1 Airway Equipment and Method: Stylet Placement Confirmation: ETT inserted through vocal cords under direct vision,  positive ETCO2 and breath sounds checked- equal and bilateral Secured at: 21 cm Tube secured with: Tape Dental Injury: Teeth and Oropharynx as per pre-operative assessment

## 2018-07-22 NOTE — Op Note (Signed)
22 July 2018  Surgeon: Wenda LowMatt Yoceline Bazar, MD, FACS  Asst:  Ovidio Kinavid Newman, MD, FACS  Anes:  General endotracheal  Procedure: Laparoscopic posterior repair of hiatal hernia (2 suture) and  sleeve gastrectomy and upper endoscopy  Diagnosis: Morbid obesity BMI 55  Complications: None noted  EBL:   minimal cc  Description of Procedure:  The patient was take to OR 1 and given general anesthesia.  The abdomen was prepped with Technicare and draped sterilely.  A timeout was performed.  Access to the abdomen was achieved with a 5 mm Optiview through the left upper quadrant.  Following insufflation, the state of the abdomen was found to be free of adhesions.  The ViSiGi 36Fr tube was inserted to deflate the stomach and was pulled back into the esophagus.  The balloon tip tube replaced the ViSiGi momentarily while we did the balloon test with 10 cc of air and this was + for a sliding hiatal hernia.  Posterior dissection was performed and the hiatal hernia was visualized and repaired with 2 sutures placed with the Endostitch (0) and TyKnots.    The pylorus was identified and we measured 5 cm back and marked the antrum.  At that point we began dissection to take down the greater curvature of the stomach using the Harmonic scalpel.  This dissection was taken all the way up to the left crus.  Posterior attachments of the stomach were also taken down.    The ViSiGi tube was then passed into the antrum and suction applied so that it was snug along the lessor curvature.  The "crow's foot" or incisura was identified.  The sleeve gastrectomy was begun using the [employer] InternationalCovidien platform stapler beginning with a 4.5 cm black load with pants followed by a 6 cm black load with TRS and then purple loads with TRS.  When the sleeve was complete the tube was taken off suction and insufflated briefly.  The tube was withdrawn.  The specimen was extracted through the 15 mm port with was closed with a PRI and 0 vicryl.   Upper  endoscopy was then performed by Dr. Ezzard StandingNewman which showed a cylindrical sleeve, no bubbles or bleeding and no evidence of a hiatal hernia.     The specimen was extracted through the 15 trocar site.  Local was provided by infiltrating with Exparel TAP block and closed 4-0 Monocryl and Dermabond.    Matt B. Daphine DeutscherMartin, MD, [facility], [place] [phone]

## 2018-07-22 NOTE — Anesthesia Postprocedure Evaluation (Signed)
Anesthesia Post Note  Patient: Cheri Guppynne K Orsini  Procedure(s) Performed: LAPAROSCOPIC GASTRIC SLEEVE RESECTION WITH UPPER ENDO AND ERAS PATHWAY (N/A )     Patient location during evaluation: PACU Anesthesia Type: General Level of consciousness: awake and alert, awake and oriented Pain management: pain level controlled Vital Signs Assessment: post-procedure vital signs reviewed and stable Respiratory status: spontaneous breathing, nonlabored ventilation and respiratory function stable Cardiovascular status: blood pressure returned to baseline and stable Postop Assessment: no apparent nausea or vomiting Anesthetic complications: no    Last Vitals:  Vitals:   07/22/18 1130 07/22/18 1230  BP: (!) 158/100 (!) 151/98  Pulse: 87 86  Resp: 18 20  Temp:  36.8 C  SpO2: 99% 98%    Last Pain:  Vitals:   07/22/18 1230  TempSrc:   PainSc: Asleep                 Cecile HearingStephen Edward Turk

## 2018-07-22 NOTE — Progress Notes (Signed)
Patient resting, husband at bedside.  Discussed plan of care with bedside RN.

## 2018-07-22 NOTE — Op Note (Signed)
Name:  Michaela Baldingnne K Baillargeon MRN: [id number] Date of Surgery: 07/22/2018  Preop Diagnosis:  Morbid Obesity  Postop Diagnosis:  Morbid Obesity (Weight - 315, BMI - 54.8), S/P Gastric Sleeve resection  Procedure:  Upper endoscopy  (Intraoperative)  Surgeon:  Ovidio Kinavid Maxen Rowland, M.D.  Anesthesia:  GET  Indications for procedure: Michaela Holland is a 37 y.o. female whose primary care physician is Sunnie NielsenAlexander, Natalie, DO and has completed a gastric sleeve resection today for weight loss by Dr. Daphine DeutscherMartin.  I am doing an intraoperative upper endoscopy to evaluate the gastric pouch after the sleeve gastrectomy.  Operative Note: The patient is under general anesthesia.  Dr. Daphine DeutscherMartin is laparoscoping the patient while I do an upper endoscopy to evaluate the stomach pouch.  With the patient intubated, I passed the Olympus upper endoscope without difficulty down the esophagus.  The esophagus was unremarkable.  The esophago-gastric junction was at 37 cm.    The mucosa of the stomach looked viable and the staple line was intact without bleeding.  I advanced the scope to the pylorus, but did not go through it.  While I insufflated the stomach pouch with air, Dr. Daphine DeutscherMartin  flooded the upper abdomen with saline to put the gastric pouch under saline.  There was no bubbling or evidence of a leak.  There was no evidence of narrowing of the pouch and the gastric sleeve looked tubular.  The scope was then withdrawn.  The esophagus was unremarkable and the patient tolerated the endoscopy without difficulty.  Ovidio Kinavid Geoge Lawrance, MD, [facility] Pager: [phone] Office phone:  [phone]

## 2018-07-22 NOTE — Progress Notes (Signed)
PHARMACY CONSULT FOR:  Risk Assessment for Post-Discharge VTE Following Bariatric Surgery  Post-Discharge VTE Risk Assessment: This patient's probability of 30-day post-discharge VTE is increased due to the factors marked:   Female    Age >/=60 years  x  BMI >/=50 kg/m2    CHF    Dyspnea at Rest    Paraplegia   x Non-gastric-band surgery    Operation Time >/=3 hr    Return to OR     Length of Stay >/= 3 d   Predicted probability of 30-day post-discharge VTE: 0.27%  Other patient-specific factors to consider:   Recommendation for Discharge: No pharmacologic prophylaxis post-discharge  Michaela Holland is a 37 y.o. female who underwent  Sleeve gastrectomy on 07/22/18   Case start: 0807 Case end: 0957   Allergies  Allergen Reactions  . Sulfa Antibiotics Hives and Shortness Of Breath  . Other     Nuts severe reaction difficulty breathing , throat swelling; mostly tree nuts but not all tree nuts   . Adhesive [Tape] Rash    Patient Measurements: Height: 5' 3.5" (161.3 cm) Weight: (!) 317 lb (143.8 kg) IBW/kg (Calculated) : 53.55 Body mass index is 55.27 kg/m.  No results for input(s): WBC, HGB, HCT, PLT, APTT, CREATININE, LABCREA, CREATININE, CREAT24HRUR, MG, PHOS, ALBUMIN, PROT, ALBUMIN, AST, ALT, ALKPHOS, BILITOT, BILIDIR, IBILI in the last 72 hours. Estimated Creatinine Clearance: 136.3 mL/min (by C-G formula based on SCr of 0.6 mg/dL).    Past Medical History:  Diagnosis Date  . Anxiety   . Back pain   . Chronic pain of left knee   . Depression   . Diabetes mellitus without complication (HCC)    denies this diagnosis   . Family history of adverse reaction to anesthesia    per patient ; father was having abdominal surgery and had to be placed on medicine to keep his blood pressure up; he never had any issues with low BP before the surgery "   . Gallbladder problem   . GERD (gastroesophageal reflux disease)   . Gout    believes only occurred once in feet;   .  Hypertension    denies this diagnosis   . Hypothyroidism   . Joint pain   . Obesity   . Osteoarthritis   . Palpitations   . PCOS (polycystic ovarian syndrome)   . PONV (postoperative nausea and vomiting)    with c section; no issues with followwing procedures   . Seasonal allergies   . Seizures (HCC)    has had "pre-seizure" activity in the brain but deneis any actual seizures   . Sleep apnea    does not currently use due to insurance   . Vitamin D deficiency      Medications Prior to Admission  Medication Sig Dispense Refill Last Dose  . acetaminophen-codeine (TYLENOL #3) 300-30 MG tablet Take 1-2 tablets by mouth every 8 (eight) hours as needed for moderate pain. (Patient taking differently: Take 1 tablet by mouth every 8 (eight) hours as needed for moderate pain. ) 50 tablet 0 07/21/2018 at Unknown time  . CANNABIDIOL PO Take 1 Dose by mouth 2 (two) times daily. CBD OIL   07/15/2018  . citalopram (CELEXA) 40 MG tablet Take 1 tablet (40 mg total) by mouth daily. 90 tablet 0 07/22/2018 at 0500  . clonazePAM (KLONOPIN) 0.5 MG tablet Take 0.5-1 tablets (0.25-0.5 mg total) by mouth 2 (two) times daily as needed for anxiety (#30 for 90 days.). 30 tablet  0 07/22/2018 at 0500  . gabapentin (NEURONTIN) 300 MG capsule One tab PO qHS for a week, then BID for a week, then TID. May double weekly to a max of 3,600mg /day (Patient taking differently: Take 600 mg by mouth 2 (two) times daily. Morning & afternoon.) 180 capsule 3 07/22/2018 at 0500  . levothyroxine (SYNTHROID, LEVOTHROID) 175 MCG tablet Take 1 tablet (175 mcg total) by mouth daily before breakfast. 90 tablet 3 07/22/2018 at 0500  . methylphenidate (RITALIN) 10 MG tablet Take 15 mg by mouth 2 (two) times daily.  0 07/22/2018 at 0500  . mirabegron ER (MYRBETRIQ) 25 MG TB24 tablet Take 1 tablet (25 mg total) by mouth daily. (Patient taking differently: Take 25 mg by mouth at bedtime. ) 30 tablet 3 07/21/2018 at Unknown time  . Multiple  Vitamins-Minerals (ADULT GUMMY PO) Take 2 tablets by mouth at bedtime.   07/21/2018 at Unknown time  . omeprazole (PRILOSEC) 40 MG capsule TAKE 1 CAPSULE BY MOUTH EVERY EVENING. (Patient taking differently: Take 40 mg by mouth daily as needed (acid reflux/indigestion.). ) 30 capsule 1 07/21/2018 at Unknown time  . Turmeric 500 MG CAPS Take 1,000 mg by mouth at bedtime.   07/15/2018  . vitamin B-12 (CYANOCOBALAMIN) 500 MCG tablet Take 500 mcg by mouth daily.   07/21/2018 at Unknown time  . VITAMIN D PO Take by mouth.   07/21/2018 at Unknown time  . albuterol (PROVENTIL HFA;VENTOLIN HFA) 108 (90 Base) MCG/ACT inhaler Inhale 1-2 puffs into the lungs every 4 (four) hours as needed for wheezing or shortness of breath (bronchospasm). 1 Inhaler 0 More than a month at Unknown time  . Brexpiprazole (REXULTI) 1 MG TABS Take 1 tablet (1 mg total) by mouth every morning. 90 tablet 0 Taking  . diclofenac (VOLTAREN) 75 MG EC tablet TAKE 1 TABLET BY MOUTH 2 TIMES DAILY. (Patient taking differently: Take 75 mg by mouth 2 (two) times daily as needed (for pain.). ) 60 tablet 1 07/12/2018  . EPINEPHrine 0.3 mg/0.3 mL IJ SOAJ injection Inject 0.3 mg into the muscle daily as needed (for anaphylactic reactions.).    Unknown at Unknown time  . ibuprofen (ADVIL,MOTRIN) 200 MG tablet Take 800 mg by mouth every 8 (eight) hours as needed (for pain.).   07/12/2018  . meclizine (ANTIVERT) 25 MG tablet Take 1 tablet (25 mg total) by mouth 3 (three) times daily as needed for dizziness. 15 tablet 1 Unknown at Unknown time  . metFORMIN (GLUCOPHAGE-XR) 500 MG 24 hr tablet Take 1 tablet (500 mg total) by mouth at bedtime. (Patient taking differently: Take 1,000 mg by mouth at bedtime. ) 90 tablet 3 07/12/2018  . Nystatin POWD Apply liberally to affected area 2 times per day (Patient taking differently: Apply 1 g topically 2 (two) times daily as needed (for rash/irritated skin.). ) 1 Bottle 11 Unknown at Unknown time  . ondansetron  (ZOFRAN-ODT) 4 MG disintegrating tablet Take 4 mg by mouth 3 (three) times daily as needed for nausea/vomiting.  0 Unknown at Unknown time  . oxyCODONE (ROXICODONE) 5 MG/5ML solution Take 5-10 mLs by mouth every 6 (six) hours as needed for pain.  0 Unknown at Unknown time  . pantoprazole (PROTONIX) 40 MG tablet Take 40 mg by mouth daily.  2 Unknown at Unknown time  . Vitamin D, Ergocalciferol, (DRISDOL) 50000 units CAPS capsule TAKE 1 CAPSULE BY MOUTH EVERY 7 DAYS (Patient not taking: Reported on 07/16/2018) 4 capsule 0 Not Taking  Luisa Harthristy, Jacoya Bauman D 07/22/2018,2:42 PM

## 2018-07-22 NOTE — Discharge Instructions (Signed)
° ° ° °GASTRIC BYPASS/SLEEVE ° Home Care Instructions ° ° These instructions are to help you care for yourself when you go home. ° °Call: If you have any problems. °• Call [phone] and ask for the surgeon on call °• If you need immediate help, come to the ER at [facility].  °• Tell the ER staff that you are a new post-op gastric bypass or gastric sleeve patient °  °Signs and symptoms to report: • Severe vomiting or nausea °o If you cannot keep down clear liquids for longer than 1 day, call your surgeon  °• Abdominal pain that does not get better after taking your pain medication °• Fever over 100.4° F with chills °• Heart beating over 100 beats a minute °• Shortness of breath at rest °• Chest pain °•  Redness, swelling, drainage, or foul odor at incision (surgical) sites °•  If your incisions open or pull apart °• Swelling or pain in calf (lower leg) °• Diarrhea (Loose bowel movements that happen often), frequent watery, uncontrolled bowel movements °• Constipation, (no bowel movements for 3 days) if this happens: Pick one °o Milk of Magnesia, 2 tablespoons by mouth, 3 times a day for 2 days if needed °o Stop taking Milk of Magnesia once you have a bowel movement °o Call your doctor if constipation continues °Or °o Miralax  (instead of Milk of Magnesia) following the label instructions °o Stop taking Miralax once you have a bowel movement °o Call your doctor if constipation continues °• Anything you think is not normal °  °Normal side effects after surgery: • Unable to sleep at night or unable to focus °• Irritability or moody °• Being tearful (crying) or depressed °These are common complaints, possibly related to your anesthesia medications that put you to sleep, stress of surgery, and change in lifestyle.  This usually goes away a few weeks after surgery.  If these feelings continue, call your primary care doctor. °  °Wound Care: You may have surgical glue, steri-strips, or staples over your incisions after  surgery °• Surgical glue:  Looks like a clear film over your incisions and will wear off a little at a time °• Steri-strips: Strips of tape over your incisions. You may notice a yellowish color on the skin under the steri-strips. This is used to make the   steri-strips stick better. Do not pull the steri-strips off - let them fall off °• Staples: Staples may be removed before you leave the hospital °o If you go home with staples, call [facility], [phone] at for an appointment with your surgeon’s nurse to have staples removed 10 days after surgery. °• Showering: You may shower two (2) days after your surgery unless your surgeon tells you differently °o Wash gently around incisions with warm soapy water, rinse well, and gently pat dry  °o No tub baths until staples are removed, steri-strips fall off or glue is gone.  °  °Medications: • Medications should be liquid or crushed if larger than the size of a dime °• Extended release pills (medication that release a little bit at a time through the day) should NOT be crushed or cut. (examples include XL, ER, DR, SR) °• Depending on the size and number of medications you take, you may need to space (take a few throughout the day)/change the time you take your medications so that you do not over-fill your pouch (smaller stomach) °• Make sure you follow-up with your primary care doctor to   make medication changes needed during rapid weight loss and life-style changes °• If you have diabetes, follow up with the doctor that orders your diabetes medication(s) within one week after surgery and check your blood sugar regularly. °• Do not drive while taking prescription pain medication  °• It is ok to take Tylenol by the bottle instructions with your pain medicine or instead of your pain medicine as needed.  DO NOT TAKE NSAIDS (EXAMPLES OF NSAIDS:  IBUPROFREN/ NAPROXEN)  °Diet:                    First 2 Weeks ° You will see the dietician t about two (2) weeks  after your surgery. The dietician will increase the types of foods you can eat if you are handling liquids well: °• If you have severe vomiting or nausea and cannot keep down clear liquids lasting longer than 1 day, call your surgeon @ ([phone]) °Protein Shake °• Drink at least 2 ounces of shake 5-6 times per day °• Each serving of protein shakes (usually 8 - 12 ounces) should have: °o 15 grams of protein  °o And no more than 5 grams of carbohydrate  °• Goal for protein each day: °o Men = 80 grams per day °o Women = 60 grams per day °• Protein powder may be added to fluids such as non-fat milk or Lactaid milk or unsweetened Soy/Almond milk (limit to 35 grams added protein powder per serving) ° °Hydration °• Slowly increase the amount of water and other clear liquids as tolerated (See Acceptable Fluids) °• Slowly increase the amount of protein shake as tolerated  °•  Sip fluids slowly and throughout the day.  Do not use straws. °• May use sugar substitutes in small amounts (no more than 6 - 8 packets per day; i.e. Splenda) ° °Fluid Goal °• The first goal is to drink at least 8 ounces of protein shake/drink per day (or as directed by the nutritionist); some examples of protein shakes are Syntrax Nectar, Adkins Advantage, EAS Edge HP, and Unjury. See handout from pre-op Bariatric Education Class: °o Slowly increase the amount of protein shake you drink as tolerated °o You may find it easier to slowly sip shakes throughout the day °o It is important to get your proteins in first °• Your fluid goal is to drink 64 - 100 ounces of fluid daily °o It may take a few weeks to build up to this °• 32 oz (or more) should be clear liquids  °And  °• 32 oz (or more) should be full liquids (see below for examples) °• Liquids should not contain sugar, caffeine, or carbonation ° °Clear Liquids: °• Water or Sugar-free flavored water (i.e. Fruit H2O, Propel) °• Decaffeinated coffee or tea (sugar-free) °• Crystal Lite, Wyler’s Lite,  Minute Maid Lite °• Sugar-free Jell-O °• Bouillon or broth °• Sugar-free Popsicle:   *Less than 20 calories each; Limit 1 per day ° °Full Liquids: °Protein Shakes/Drinks + 2 choices per day of other full liquids °• Full liquids must be: °o No More Than 15 grams of Carbs per serving  °o No More Than 3 grams of Fat per serving °• Strained low-fat cream soup (except Cream of Potato or Tomato) °• Non-Fat milk °• Fat-free Lactaid Milk °• Unsweetened Soy Or Unsweetened Almond Milk °• Low Sugar yogurt (Dannon Lite & Fit, Greek yogurt; Oikos Triple Zero; Chobani Simply 100; Yoplait 100 calorie Greek - No Fruit on the Bottom) ° °  °Vitamins   and Minerals • Start 1 day after surgery unless otherwise directed by your surgeon °• 2 Chewable Bariatric Specific Multivitamin / Multimineral Supplement with iron (Example: Bariatric Advantage Multi EA) °• Chewable Calcium with Vitamin D-3 °(Example: 3 Chewable Calcium Plus 600 with Vitamin D-3) °o Take 500 mg three (3) times a day for a total of 1500 mg each day °o Do not take all 3 doses of calcium at one time as it may cause constipation, and you can only absorb 500 mg  at a time  °o Do not mix multivitamins containing iron with calcium supplements; take 2 hours apart °• Menstruating women and those with a history of anemia (a blood disease that causes weakness) may need extra iron °o Talk with your doctor to see if you need more iron °• Do not stop taking or change any vitamins or minerals until you talk to your dietitian or surgeon °• Your Dietitian and/or surgeon must approve all vitamin and mineral supplements °  °Activity and Exercise: Limit your physical activity as instructed by your doctor.  It is important to continue walking at home.  During this time, use these guidelines: °• Do not lift anything greater than ten (10) pounds for at least two (2) weeks °• Do not go back to work or drive until your surgeon says you can °• You may have sex when you feel comfortable  °o It is  VERY important for female patients to use a reliable birth control method; fertility often increases after surgery  °o All hormonal birth control will be ineffective for 30 days after surgery due to medications given during surgery a barrier method must be used. °o Do not get pregnant for at least 18 months °• Start exercising as soon as your doctor tells you that you can °o Make sure your doctor approves any physical activity °• Start with a simple walking program °• Walk 5-15 minutes each day, 7 days per week.  °• Slowly increase until you are walking 30-45 minutes per day °Consider joining our BELT program. [phone] or email [email or web address] °  °Special Instructions Things to remember: °• Use your CPAP when sleeping if this applies to you ° °• [facility] has two free Bariatric Surgery Support Groups that meet monthly °o The 3rd Thursday of each month, 6 pm, [facility]  °o The 2nd Friday of each month, 11:45 am in the private dining room in the basement of  °• It is very important to keep all follow up appointments with your surgeon, dietitian, primary care physician, and behavioral health practitioner °• Routine follow up schedule with your surgeon include appointments at 2-3 weeks, 6-8 weeks, 6 months, and 1 year at a minimum.  Your surgeon may request to see you more often.   °o After the first year, please follow up with your bariatric surgeon and dietitian at least once a year in order to maintain best weight loss results °[facility]: [phone] °[facility]: [phone] °Bariatric Nurse Coordinator: [phone] °  °   Reviewed and Endorsed  °by [place] Patient Education Committee, June, 2016 °Edits Approved: Aug, 2018 ° ° ° °

## 2018-07-22 NOTE — Interval H&P Note (Signed)
History and Physical Interval Note:  07/22/2018 7:21 AM  Michaela GuppyAnne K Oyama  has presented today for surgery, with the diagnosis of MORBID OBESITY  The various methods of treatment have been discussed with the patient and family. After consideration of risks, benefits and other options for treatment, the patient has consented to  Procedure(s): LAPAROSCOPIC GASTRIC SLEEVE RESECTION WITH UPPER ENDO AND ERAS PATHWAY (N/A) as a surgical intervention .  The patient's history has been reviewed, patient examined, no change in status, stable for surgery.  I have reviewed the patient's chart and labs.  Questions were answered to the patient's satisfaction.     Valarie MerinoMatthew B Aveleen Nevers

## 2018-07-22 NOTE — Anesthesia Preprocedure Evaluation (Addendum)
Anesthesia Evaluation  Patient identified by MRN, date of birth, ID band Patient awake    Reviewed: Allergy & Precautions, NPO status , Patient's Chart, lab work & pertinent test results  History of Anesthesia Complications (+) PONV, Family history of anesthesia reaction and history of anesthetic complications  Airway Mallampati: I  TM Distance: >3 FB Neck ROM: Full    Dental  (+) Teeth Intact, Dental Advisory Given   Pulmonary sleep apnea ,    Pulmonary exam normal breath sounds clear to auscultation       Cardiovascular hypertension, Normal cardiovascular exam Rhythm:Regular Rate:Normal     Neuro/Psych Seizures -,  PSYCHIATRIC DISORDERS Anxiety Depression    GI/Hepatic Neg liver ROS, GERD  Medicated,  Endo/Other  diabetes, Type 2, Oral Hypoglycemic AgentsHypothyroidism Morbid obesity  Renal/GU negative Renal ROS     Musculoskeletal  (+) Arthritis ,   Abdominal   Peds  Hematology negative hematology ROS (+)   Anesthesia Other Findings Day of surgery medications reviewed with the patient.  Reproductive/Obstetrics                            Anesthesia Physical Anesthesia Plan  ASA: III  Anesthesia Plan: General   Post-op Pain Management:    Induction: Intravenous  PONV Risk Score and Plan: 4 or greater and Diphenhydramine, Scopolamine patch - Pre-op, Midazolam, Dexamethasone and Ondansetron  Airway Management Planned: Oral ETT  Additional Equipment:   Intra-op Plan:   Post-operative Plan: Extubation in OR  Informed Consent: I have reviewed the patients History and Physical, chart, labs and discussed the procedure including the risks, benefits and alternatives for the proposed anesthesia with the patient or authorized representative who has indicated his/her understanding and acceptance.   Dental advisory given  Plan Discussed with: CRNA  Anesthesia Plan Comments:          Anesthesia Quick Evaluation

## 2018-07-23 ENCOUNTER — Encounter (HOSPITAL_COMMUNITY): Payer: Self-pay | Admitting: Surgery

## 2018-07-23 LAB — CBC WITH DIFFERENTIAL/PLATELET
Abs Immature Granulocytes: 0.07 10*3/uL (ref 0.00–0.07)
BASOS PCT: 0 %
Basophils Absolute: 0 10*3/uL (ref 0.0–0.1)
EOS ABS: 0 10*3/uL (ref 0.0–0.5)
EOS PCT: 0 %
HEMATOCRIT: 37.6 % (ref 36.0–46.0)
Hemoglobin: 11.4 g/dL — ABNORMAL LOW (ref 12.0–15.0)
Immature Granulocytes: 1 %
LYMPHS ABS: 1.5 10*3/uL (ref 0.7–4.0)
Lymphocytes Relative: 15 %
MCH: 27.1 pg (ref 26.0–34.0)
MCHC: 30.3 g/dL (ref 30.0–36.0)
MCV: 89.3 fL (ref 80.0–100.0)
MONOS PCT: 8 %
Monocytes Absolute: 0.8 10*3/uL (ref 0.1–1.0)
NRBC: 0 % (ref 0.0–0.2)
Neutro Abs: 7.8 10*3/uL — ABNORMAL HIGH (ref 1.7–7.7)
Neutrophils Relative %: 76 %
Platelets: 302 10*3/uL (ref 150–400)
RBC: 4.21 MIL/uL (ref 3.87–5.11)
RDW: 13.3 % (ref 11.5–15.5)
WBC: 10.3 10*3/uL (ref 4.0–10.5)

## 2018-07-23 NOTE — Progress Notes (Signed)
Patient alert and oriented, Post op day 1.  Provided support and encouragement.  Encouraged pulmonary toilet, ambulation and small sips of liquids.  Completed 12 ounces of bari clear fluid and started protein shake.  All questions answered.  Will continue to monitor. 

## 2018-07-23 NOTE — Plan of Care (Signed)

## 2018-07-23 NOTE — Progress Notes (Signed)
Pt started drinking first 2oz cup of protein at 0700.

## 2018-07-23 NOTE — Discharge Summary (Signed)
Physician Discharge Summary  Patient ID: Michaela Holland K Dollar MRN: [id number] DOB/AGE: 37-Jun-1982 37 y.o.  PCP: Sunnie NielsenAlexander, Natalie, DO  Admit date: 07/22/2018 Discharge date: 07/23/2018  Admission Diagnoses:  Morbid obesity and GERD  Discharge Diagnoses:  same  Principal Problem:   S/P laparoscopic sleeve gastrectomyNov2019   Surgery:  Laparoscopic repair of hiatal hernia and sleeve gastrectomy  Discharged Condition: improved  Hospital Course:   Had surgery and was begun on clears.  These were advanced to protein shakes and she was ready to go home on PD 1  Consults: none  Significant Diagnostic Studies: none    Discharge Exam: Blood pressure (!) 160/98, pulse 71, temperature 98.8 F (37.1 C), temperature source Oral, resp. rate 18, height 5' 3.5" (1.613 m), weight (!) 143.8 kg, last menstrual period 07/07/2018, SpO2 100 %. Incisions look good   Disposition: Discharge disposition: 01-Home or Self Care       Discharge Instructions    Ambulate hourly while awake   Complete by:  As directed    Call MD for:  difficulty breathing, headache or visual disturbances   Complete by:  As directed    Call MD for:  persistant dizziness or light-headedness   Complete by:  As directed    Call MD for:  persistant nausea and vomiting   Complete by:  As directed    Call MD for:  redness, tenderness, or signs of infection (pain, swelling, redness, odor or green/yellow discharge around incision site)   Complete by:  As directed    Call MD for:  severe uncontrolled pain   Complete by:  As directed    Call MD for:  temperature >101 F   Complete by:  As directed    Diet bariatric full liquid   Complete by:  As directed    Incentive spirometry   Complete by:  As directed    Perform hourly while awake     Allergies as of 07/23/2018      Reactions   Sulfa Antibiotics Hives, Shortness Of Breath   Other    Nuts severe reaction difficulty breathing , throat swelling; mostly tree nuts but  not all tree nuts    Adhesive [tape] Rash      Medication List    STOP taking these medications   CANNABIDIOL PO   diclofenac 75 MG EC tablet Commonly known as:  VOLTAREN   ibuprofen 200 MG tablet Commonly known as:  ADVIL,MOTRIN   omeprazole 40 MG capsule Commonly known as:  PRILOSEC     TAKE these medications   acetaminophen-codeine 300-30 MG tablet Commonly known as:  TYLENOL #3 Take 1-2 tablets by mouth every 8 (eight) hours as needed for moderate pain. What changed:  how much to take   ADULT GUMMY PO Take 2 tablets by mouth at bedtime.   albuterol 108 (90 Base) MCG/ACT inhaler Commonly known as:  PROVENTIL HFA;VENTOLIN HFA Inhale 1-2 puffs into the lungs every 4 (four) hours as needed for wheezing or shortness of breath (bronchospasm).   Brexpiprazole 1 MG Tabs Take 1 tablet (1 mg total) by mouth every morning.   citalopram 40 MG tablet Commonly known as:  CELEXA Take 1 tablet (40 mg total) by mouth daily.   clonazePAM 0.5 MG tablet Commonly known as:  KLONOPIN Take 0.5-1 tablets (0.25-0.5 mg total) by mouth 2 (two) times daily as needed for anxiety (#30 for 90 days.).   EPINEPHrine 0.3 mg/0.3 mL Soaj injection Commonly known as:  EPI-PEN Inject 0.3 mg into  the muscle daily as needed (for anaphylactic reactions.).   gabapentin 300 MG capsule Commonly known as:  NEURONTIN One tab PO qHS for a week, then BID for a week, then TID. May double weekly to a max of 3,600mg /day What changed:    how much to take  how to take this  when to take this  additional instructions   levothyroxine 175 MCG tablet Commonly known as:  SYNTHROID, LEVOTHROID Take 1 tablet (175 mcg total) by mouth daily before breakfast.   meclizine 25 MG tablet Commonly known as:  ANTIVERT Take 1 tablet (25 mg total) by mouth 3 (three) times daily as needed for dizziness.   metFORMIN 500 MG 24 hr tablet Commonly known as:  GLUCOPHAGE-XR Take 1 tablet (500 mg total) by mouth at  bedtime. What changed:  how much to take   methylphenidate 10 MG tablet Commonly known as:  RITALIN Take 15 mg by mouth 2 (two) times daily.   mirabegron ER 25 MG Tb24 tablet Commonly known as:  MYRBETRIQ Take 1 tablet (25 mg total) by mouth daily. What changed:  when to take this   Nystatin Powd Apply liberally to affected area 2 times per day What changed:    how much to take  how to take this  when to take this  reasons to take this  additional instructions   ondansetron 4 MG disintegrating tablet Commonly known as:  ZOFRAN-ODT Take 4 mg by mouth 3 (three) times daily as needed for nausea/vomiting.   oxyCODONE 5 MG/5ML solution Commonly known as:  ROXICODONE Take 5-10 mLs by mouth every 6 (six) hours as needed for pain.   pantoprazole 40 MG tablet Commonly known as:  PROTONIX Take 40 mg by mouth daily.   Turmeric 500 MG Caps Take 1,000 mg by mouth at bedtime.   vitamin B-12 500 MCG tablet Commonly known as:  CYANOCOBALAMIN Take 500 mcg by mouth daily.   Vitamin D (Ergocalciferol) 1.25 MG (50000 UT) Caps capsule Commonly known as:  DRISDOL TAKE 1 CAPSULE BY MOUTH EVERY 7 DAYS   VITAMIN D PO Take by mouth.      Follow-up Information    Sur[facility], [place]. Go on 08/13/2018.   Specialty:  General Surgery Why:  at 130 Contact information: [address] [place] [address] [phone]        [facility]ed[facility] Slade, [place]. Go on 09/05/2018.   Specialty:  General Surgery Why:  at 2 Contact information: [address] [place] [address] [place] [address] [phone]           Signed: Valarie Merino 07/23/2018, 1:22 PM

## 2018-07-23 NOTE — Progress Notes (Signed)
Patient alert and oriented, pain is controlled. Patient is tolerating fluids, advanced to protein shake today, patient is tolerating well.  Reviewed Gastric sleeve discharge instructions with patient and patient is able to articulate understanding.  Provided information on BELT program, Support Group and [facility] outpatient pharmacy. All questions answered, will continue to monitor.   Total fluid intake 660 Call back one week post op per dehydration protocol

## 2018-07-23 NOTE — Progress Notes (Signed)
Discharge instructions given to pt and all questions were answered. Pt taken down via wheelchair and was picked up by her husband.  

## 2018-07-25 ENCOUNTER — Other Ambulatory Visit: Payer: Self-pay | Admitting: *Deleted

## 2018-07-25 NOTE — Patient Outreach (Signed)
[facility]) Care Management  07/25/2018  Michaela Holland [birth year] [id number]  Subjective: Telephone call to patient's home / mobile number, spoke with patient, and HIPAA verified.  [facility] Transition of care follow up, patient voiced understanding, and is in agreement to follow up.   Patient states she is not feeling very well, has notified her MD of her status,  not up to talking at this time, and requested call back at a later time.     Objective: Per KPN (Knowledge Performance Now, point of care tool) and chart review, patient hospitalized 07/22/18 -07/23/18 for morbid obesity, status post Laparoscopic posterior repair of hiatal hernia (2 suture) and  sleeve gastrectomy and upper endoscopy on 07/22/18.    Patient also has a history of diabetes, hypertension, gout, seizures, hypothyroidism, sleep apnea, Vitamin D deficiency, and PCOS (polycystic ovarian syndrome).        Assessment: Received [facility] Transition of care referral on 07/26/18.  Transition of care follow up pending patient contact.         Plan: RNCM will send unsuccessful outreach  letter, [facility], will call patient for 2nd telephone outreach attempt, transition of care follow up, and proceed with case closure, within 10 business days if no return call.        Jasmeet Manton H. Gardiner Barefootooper RN, BSN, CCM [facility] Telephonic CM Phone: [phone] Fax: [phone]

## 2018-07-28 ENCOUNTER — Telehealth (HOSPITAL_COMMUNITY): Payer: Self-pay

## 2018-07-28 ENCOUNTER — Other Ambulatory Visit: Payer: Self-pay | Admitting: *Deleted

## 2018-07-28 NOTE — Patient Outreach (Signed)
[facility]) Care Management  07/28/2018  Michaela Holland 1981/07/25 [id number]   Subjective: Telephone call to patient's home  / mobile number, no answer, left HIPAA compliant voicemail message, and requested call back.    Objective: Per KPN (Knowledge Performance Now, point of care tool) and chart review, patient hospitalized 07/22/18 -07/23/18 for morbid obesity, status post Laparoscopicposterior repair of hiatal hernia (2 suture) andsleeve gastrectomy and upper endoscopy on 07/22/18.    Patient also has a history of diabetes, hypertension, gout, seizures, hypothyroidism, sleep apnea, Vitamin D deficiency, and PCOS (polycystic ovarian syndrome).        Assessment: Received [facility] Transition of care referral on 07/26/18.  Transition of care follow up pending patient contact.         Plan: RNCM has sent unsuccessful outreach  letter, [facility] pamph[facility], will call patient for 3rd telephone outreach attempt, transition of care follow up, and proceed with case closure, within 10 business days if no return call.       Oshen Wlodarczyk H. Gardiner Barefootooper RN, BSN, CCM [facility] Care Management [facility] Telephonic CM Phone: [phone] Fax: [phone]

## 2018-07-28 NOTE — Telephone Encounter (Signed)
Patient called to discuss post bariatric surgery follow up questions.  See below:   1.  Tell me about your pain and pain management? x4 times has helped with pain  2.  Let's talk about fluid intake.  How much total fluid are you taking in?56 ounces of fluid  3.  How much protein have you taken in the last 2 days?60 grams of protein  4.  Have you had nausea?  Tell me about when have experienced nausea and what you did to help?x 3 times helps with pain  5.  Has the frequency or color changed with your urine?urinating frequently   6.  Tell me what your incisions look like?one incision open seeing Puja in office  7.  Have you been passing gas? BM? Passing gas bm since discharge  8.  If a problem or question were to arise who would you call?  Do you know contact numbers for [facility], [facility], and [facility]?aware of how to contact services  9.  How has the walking going?walking regularly  10.  How are your vitamins and calcium going?  How are you taking them?multvittamin and calcium with not problems

## 2018-07-29 ENCOUNTER — Other Ambulatory Visit: Payer: Self-pay | Admitting: *Deleted

## 2018-07-29 NOTE — Patient Outreach (Addendum)
[facility]) Care Management  07/29/2018  Garald Baldingnne K Barre 1981-04-21 [id number]   Subjective: Telephone call to patient's home / mobile number, spoke with patient, and HIPAA verified.  [facility]ed [facility] Transition of care follow up, patient voiced understanding, and is in agreement to follow up.   Patient states she is currently sleeping, will call me back  and / or requested a call back at a later time.        Objective:Per KPN (Knowledge Performance Now, point of care tool) and chart review,patient hospitalized 07/22/18 -07/23/18 for morbid obesity, status postLaparoscopicposterior repair of hiatal hernia (2 suture) andsleeve gastrectomy and upper endoscopyon 07/22/18. Patient also has a history of diabetes, hypertension, gout, seizures, hypothyroidism, sleep apnea, Vitamin D deficiency, andPCOS (polycystic ovarian syndrome).       Assessment: Received [facility] Transition of care referral on 07/26/18.Transition of care follow up pending patient contact.        Plan:RNCM has sent unsuccessful outreach letter, [facility] pamph[facility], and will  proceed with case closure, within 10 business days if no return call.       Lilibeth Opie H. Gardiner Barefootooper RN, BSN, CCM [facility] Care [facility] Telephonic CM Phone: [phone] Fax: [phone]

## 2018-07-30 ENCOUNTER — Encounter: Payer: Self-pay | Admitting: Osteopathic Medicine

## 2018-07-30 ENCOUNTER — Ambulatory Visit (INDEPENDENT_AMBULATORY_CARE_PROVIDER_SITE_OTHER): Payer: 59 | Admitting: Osteopathic Medicine

## 2018-07-30 DIAGNOSIS — E282 Polycystic ovarian syndrome: Secondary | ICD-10-CM

## 2018-07-30 DIAGNOSIS — Z308 Encounter for other contraceptive management: Secondary | ICD-10-CM | POA: Diagnosis not present

## 2018-07-30 MED ORDER — GABAPENTIN 300 MG PO CAPS: 600.0000 mg | ORAL_CAPSULE | Freq: Two times a day (BID) | ORAL | 3 refills | Status: DC

## 2018-07-30 MED ORDER — METFORMIN HCL ER 500 MG PO TB24: 1000.0000 mg | ORAL_TABLET | Freq: Every day | ORAL | 3 refills | Status: DC

## 2018-07-30 NOTE — Patient Instructions (Signed)
From UpToDate: "Because studies suggest efficacy of the contraceptive patch may be diminished in women ?90 kg (198 pounds), we provide women who weigh >90 kg with this information. A pooled analysis of three large studies examining the efficacy of the contraceptive patch reported contraceptive failure was low and uniformly distributed across the range of body weights <90 kg (198 pounds) but suggested the patch may be less effective in the subgroup of women ?90 kg. In this analysis, 5 of the 15 pregnancies in patch users occurred in the 83 women (3 percent of study subjects) who weighed ?90 kg. The small number of pregnancies in this pooled analysis made it impossible to provide a good estimate of the excess risk of contraceptive failure in women weighing ?90 kg."   Natural Family Planning:  "Fertility-awareness based methods for preventing pregnancy are based upon the physiological changes during the menstrual cycle. These methods, also called "natural family planning," involve identifying the fertile days of the menstrual cycle using a combination of cycle length and physical manifestations of ovulation (change in cervical secretions, basal body temperature) and then avoiding sexual intercourse or using barrier methods on those days."  Possibility of pregnancy from intercourse on days relative to ovulation  Data from: Cathleen FearsWilcox A, et al. Lesle ChrisPost-ovulatory aging of the human oocyte and embryo failure. Hum Reprod [id number]; 12:394. Reproduced with permission from the [employer], [facility].

## 2018-07-30 NOTE — Progress Notes (Signed)
HPI: Michaela Holland is a 37 y.o. female who  has a past medical history of Anxiety, Back pain, Chronic pain of left knee, Depression, Diabetes mellitus without complication (HCC), Family history of adverse reaction to anesthesia, Gallbladder problem, GERD (gastroesophageal reflux disease), Gout, Hypertension, Hypothyroidism, Joint pain, Obesity, Osteoarthritis, Palpitations, PCOS (polycystic ovarian syndrome), PONV (postoperative nausea and vomiting), Seasonal allergies, Seizures (HCC), Sleep apnea, and Vitamin D deficiency.  [facility],  for chief complaint of:  Follow-up from surgery  S/p laparoscopic sleeve gastrectomy   Doing well after surgery Questions about contraception - does not want IUD, pills, NuvaRing, Depo injection, Nexplanon. We dicussed NFP and patches as below - see A/P    At today's visit... Past medical history, surgical history, and family history reviewed and updated as needed.  Current medication list and allergy/intolerance information reviewed and updated as needed. (See remainder of HPI, ROS, Phys Exam below)         ASSESSMENT/PLAN: The primary encounter diagnosis was Morbid obesity (HCC). Diagnoses of PCOS (polycystic ovarian syndrome) and Encounter for other contraceptive management were also pertinent to this visit.  Pt will consider patches, see below - some higher risk of unplanned pregnancy in patients >198 lbs   Patient Instructions  From UpToDate: "Because studies suggest efficacy of the contraceptive patch may be diminished in women ?90 kg (198 pounds), we provide women who weigh >90 kg with this information. A pooled analysis of three large studies examining the efficacy of the contraceptive patch reported contraceptive failure was low and uniformly distributed across the range of body weights <90 kg (198 pounds) but suggested the patch may be less effective in the subgroup of women  ?90 kg. In this analysis, 5 of the 15 pregnancies in patch users occurred in the 83 women (3 percent of study subjects) who weighed ?90 kg. The small number of pregnancies in this pooled analysis made it impossible to provide a good estimate of the excess risk of contraceptive failure in women weighing ?90 kg."   Natural Family Planning:  "Fertility-awareness based methods for preventing pregnancy are based upon the physiological changes during the menstrual cycle. These methods, also called "natural family planning," involve identifying the fertile days of the menstrual cycle using a combination of cycle length and physical manifestations of ovulation (change in cervical secretions, basal body temperature) and then avoiding sexual intercourse or using barrier methods on those days."  Possibility of pregnancy from intercourse on days relative to ovulation  Data from: Cathleen Fears A, et al. Lesle Chris aging of the human oocyte and embryo failure. Hum Reprod [id number]; 12:394. Reproduced with permission from the [facility] [employer], [facility].      Follow-up plan: Return in about 3 months (around 10/30/2018) for monitor weight and blood pressure - see me sooner if needed .                             ############################################ ############################################ ############################################ ############################################    No outpatient medications have been marked as taking for the 07/30/18 encounter (Appointment) with Sunnie Nielsen, DO.    Allergies  Allergen Reactions  . Sulfa Antibiotics Hives and Shortness Of Breath  . Other     Nuts severe reaction difficulty breathing , throat swelling; mostly tree nuts but not all tree nuts   . Adhesive [Tape] Rash       Review of Systems:  Constitutional: No  recent illness  HEENT: No  headache, no vision change  Cardiac: No   chest pain, No  pressure, No palpitations  Respiratory:  No  shortness of breath. No  Cough  Gastrointestinal: No  abdominal pain  Musculoskeletal: No new myalgia/arthralgia  Neurologic: No  weakness, No  Dizziness  Psychiatric: No  concerns with depression, No  concerns with anxiety  Exam:  BP 133/81 (BP Location: Left Arm, Patient Position: Sitting, Cuff Size: Large)   Pulse 87   Temp (!) 97.5 F (36.4 C) (Oral)   Wt (!) 301 lb 8 oz (136.8 kg)   LMP 07/07/2018 (Exact Date)   BMI 52.57 kg/m   Constitutional: VS see above. General Appearance: alert, well-developed, well-nourished, NAD  Eyes: Normal lids and conjunctive, non-icteric sclera  Ears, Nose, Mouth, Throat: MMM, Normal external inspection ears/nares/mouth/lips/gums.  Neck: No masses, trachea midline.   Respiratory: Normal respiratory effort. no wheeze, no rhonchi, no rales  Cardiovascular: S1/S2 normal, no murmur, no rub/gallop auscultated. RRR.   Musculoskeletal: Gait normal. Symmetric and independent movement of all extremities  Abdominal: non-tender, non-distended, no appreciable organomegaly, neg Murphy's, BS WNLx4  Neurological: Normal balance/coordination. No tremor.  Skin: warm, dry, intact.   Psychiatric: Normal judgment/insight. Normal mood and affect. Oriented x3.       Visit summary with medication list and pertinent instructions was printed for patient to review, patient was advised to alert us if any updates are needed. All questions at time of visit were answered - patient instructed to contact office with any additional concerns. ER/RTC precautions were reviewed with the patient and understanding verbalized.   Note: Total time spent 25 minutes, greater than 50% of the visit was spent face-to-face counseling and coordinating care for the following: The primary encounter diagnosis was Morbid obesity (HCC). Diagnoses of PCOS (polycystic ovarian syndrome) and Encounter for other contraceptive  management were also pertinent to this visit.Marland Kitchen.  Please note: voice recognition software was used to produce this document, and typos may escape review. Please contact Dr. Lyn HollingsheadAlexander for any needed clarifications.    Follow up plan: Return in about 3 months (around 10/30/2018) for monitor weight and blood pressure - see me sooner if needed .

## 2018-07-31 ENCOUNTER — Encounter: Payer: Self-pay | Admitting: *Deleted

## 2018-07-31 ENCOUNTER — Other Ambulatory Visit: Payer: Self-pay | Admitting: *Deleted

## 2018-07-31 NOTE — Patient Outreach (Signed)
[facility]) Care Management  07/31/2018  Michaela Holland 09-17-1980 [id number]   Subjective: Telephone call to patient's home / mobile number, spoke with patient, and HIPAA verified.  Discussed [facility] Care Management [facility] Transition of care follow up, patient voiced understanding, and is in agreement to follow up.   Patient states she remembers speaking with this RNCM in the past.   States she is feeling much better, getting better everyday, tolerating the full liquid diet without difficulty, and has a follow up appointment with surgeon on 08/06/18.  States she had a follow up appointment with primary MD on 07/30/18 and appointment went well.  Patient states she is able to manage self care and has assistance as needed. Patient voices understanding of medical diagnosis, surgery, and treatment plan.  Patient states she does not have hypertension or diabetes, advised this was already documented in her chart by another provider.  States she takes metformin for PCOS and not diabetes.  States she is accessing the following [employer] benefits: outpatient pharmacy, hospital indemnity (not sure if chosen, verbally given contact number for [employer] [phone], will verify benefit, will file claim if appropriate, verbally given contact number for [facility] [phone] to request itemized bill) and not eligible for family medical leave act (FMLA).   States states she is no longer a [employer] Employee but still carries benefits at this time.  Patient states she does not have any education material, transition of care, care coordination, disease management, disease monitoring, transportation, community resource, or pharmacy needs at this time. States she is very appreciative of the follow up and is in agreement to receive [facility] information.       Objective:Per KPN (Knowledge Performance Now, point of care tool) and chart review,patient hospitalized 07/22/18 -07/23/18 for morbid  obesity, status postLaparoscopicposterior repair of hiatal hernia (2 suture) andsleeve gastrectomy and upper endoscopyon 07/22/18. Patient also has a history of diabetes, hypertension, gout, seizures, hypothyroidism, sleep apnea, Vitamin D deficiency, andPCOS (polycystic ovarian syndrome).       Assessment: Received [facility] Transition of care referral on 07/26/18.Transition of care follow up completed, no care management needs, and will proceed with case closure.        Plan:RNCM will send patient successful outreach letter, [facility] pamphlet, and magnet. RNCM will complete case closure due to follow up completed / no care management needs.         Koven Belinsky H. Gardiner Barefootooper RN, BSN, CCM [facility] Care Management [facility] Telephonic CM Phone: [phone] Fax: [phone]

## 2018-08-03 ENCOUNTER — Encounter: Payer: Self-pay | Admitting: Neurology

## 2018-08-04 ENCOUNTER — Encounter: Payer: Self-pay | Admitting: Osteopathic Medicine

## 2018-08-04 ENCOUNTER — Ambulatory Visit (INDEPENDENT_AMBULATORY_CARE_PROVIDER_SITE_OTHER): Payer: 59

## 2018-08-04 ENCOUNTER — Ambulatory Visit (INDEPENDENT_AMBULATORY_CARE_PROVIDER_SITE_OTHER): Payer: 59 | Admitting: Osteopathic Medicine

## 2018-08-04 VITALS — BP 139/72 | HR 91 | Temp 97.8°F | Wt 298.5 lb

## 2018-08-04 DIAGNOSIS — R05 Cough: Secondary | ICD-10-CM | POA: Diagnosis not present

## 2018-08-04 DIAGNOSIS — J4531 Mild persistent asthma with (acute) exacerbation: Secondary | ICD-10-CM

## 2018-08-04 DIAGNOSIS — J40 Bronchitis, not specified as acute or chronic: Secondary | ICD-10-CM

## 2018-08-04 DIAGNOSIS — R059 Cough, unspecified: Secondary | ICD-10-CM

## 2018-08-04 DIAGNOSIS — G4733 Obstructive sleep apnea (adult) (pediatric): Secondary | ICD-10-CM | POA: Diagnosis not present

## 2018-08-04 MED ORDER — PREDNISONE 20 MG PO TABS: 20.0000 mg | ORAL_TABLET | Freq: Two times a day (BID) | ORAL | 0 refills | Status: DC

## 2018-08-04 MED ORDER — AZITHROMYCIN 250 MG PO TABS: ORAL_TABLET | ORAL | 0 refills | Status: DC

## 2018-08-04 MED ORDER — GUAIFENESIN-CODEINE 100-10 MG/5ML PO SOLN: 5.0000 mL | ORAL | 0 refills | Status: DC | PRN

## 2018-08-04 NOTE — Progress Notes (Signed)
HPI: Michaela Holland is a 37 y.o. female who  has a past medical history of Anxiety, Back pain, Chronic pain of left knee, Depression, Diabetes mellitus without complication (HCC), Family history of adverse reaction to anesthesia, Gallbladder problem, GERD (gastroesophageal reflux disease), Gout, Hypertension, Hypothyroidism, Joint pain, Obesity, Osteoarthritis, Palpitations, PCOS (polycystic ovarian syndrome), PONV (postoperative nausea and vomiting), Seasonal allergies, Seizures (HCC), Sleep apnea, and Vitamin D deficiency.  [facility], 08/04/18,  for chief complaint of: Cough  . Context: recent bariatric surgery went well, POD 13 today.  . Location/Quality: chest congestion, coughing, reports wheezing . Duration: 2 weeks . Modifying factors: OTC measures not helping, albuterol helping some  . Assoc signs/symptoms: headache        Past medical, surgical, social and family history reviewed and updated as necessary.   Current medication list and allergy/intolerance information reviewed:    Current Outpatient Medications  Medication Sig Dispense Refill  . acetaminophen-codeine (TYLENOL #3) 300-30 MG tablet Take 1-2 tablets by mouth every 8 (eight) hours as needed for moderate pain. (Patient taking differently: Take 1 tablet by mouth every 8 (eight) hours as needed for moderate pain. ) 50 tablet 0  . albuterol (PROVENTIL HFA;VENTOLIN HFA) 108 (90 Base) MCG/ACT inhaler Inhale 1-2 puffs into the lungs every 4 (four) hours as needed for wheezing or shortness of breath (bronchospasm). 1 Inhaler 0  . Brexpiprazole (REXULTI) 1 MG TABS Take 1 tablet (1 mg total) by mouth every morning. 90 tablet 0  . calcium-vitamin D (OSCAL WITH D) 500-200 MG-UNIT tablet Take 1 tablet by mouth 3 (three) times daily.    . citalopram (CELEXA) 40 MG tablet Take 1 tablet (40 mg total) by mouth daily. 90 tablet 0  . clonazePAM (KLONOPIN) 0.5 MG tablet Take 0.5-1  tablets (0.25-0.5 mg total) by mouth 2 (two) times daily as needed for anxiety (#30 for 90 days.). 30 tablet 0  . EPINEPHrine 0.3 mg/0.3 mL IJ SOAJ injection Inject 0.3 mg into the muscle daily as needed (for anaphylactic reactions.).     Marland Kitchen. gabapentin (NEURONTIN) 300 MG capsule Take 2 capsules (600 mg total) by mouth 2 (two) times daily. Morning & afternoon. 180 capsule 3  . levothyroxine (SYNTHROID, LEVOTHROID) 175 MCG tablet Take 1 tablet (175 mcg total) by mouth daily before breakfast. 90 tablet 3  . meclizine (ANTIVERT) 25 MG tablet Take 1 tablet (25 mg total) by mouth 3 (three) times daily as needed for dizziness. 15 tablet 1  . metFORMIN (GLUCOPHAGE-XR) 500 MG 24 hr tablet Take 2 tablets (1,000 mg total) by mouth at bedtime. 180 tablet 3  . methylphenidate (RITALIN) 10 MG tablet Take 15 mg by mouth 2 (two) times daily.  0  . Multiple Vitamins-Minerals (ADULT GUMMY PO) Take 2 tablets by mouth at bedtime.    . Multiple Vitamins-Minerals (BARIATRIC FUSION) CHEW Chew 1 tablet by mouth 2 (two) times daily.    . ondansetron (ZOFRAN-ODT) 4 MG disintegrating tablet Take 4 mg by mouth 3 (three) times daily as needed for nausea/vomiting.  0  . oxyCODONE (ROXICODONE) 5 MG/5ML solution Take 5-10 mLs by mouth every 6 (six) hours as needed for pain.  0  . pantoprazole (PROTONIX) 40 MG tablet Take 40 mg by mouth daily.  2  . Turmeric 500 MG CAPS Take 1,000 mg by mouth at bedtime.    . vitamin B-12 (CYANOCOBALAMIN) 500 MCG tablet Take 500 mcg by mouth daily.    Marland Kitchen. VITAMIN D PO  Take by mouth.    . Vitamin D, Ergocalciferol, (DRISDOL) 50000 units CAPS capsule TAKE 1 CAPSULE BY MOUTH EVERY 7 DAYS 4 capsule 0   No current facility-administered medications for this visit.     Allergies  Allergen Reactions  . Sulfa Antibiotics Hives and Shortness Of Breath  . Other     Nuts severe reaction difficulty breathing , throat swelling; mostly tree nuts but not all tree nuts   . Adhesive [Tape] Rash      Review  of Systems:  Constitutional:  No  fever, no chills, +recent illness, No unintentional weight changes. +significant fatigue.   HEENT: No  headache, no vision change, no hearing change,cao sore throat, No  sinus pressure  Cardiac: No  chest pain, No  pressure, No palpitations  Respiratory:  No  shortness of breath. +Cough  Gastrointestinal: No  abdominal pain, No  nausea, No  vomiting,  No  blood in stool, No  diarrhea  Musculoskeletal: No new myalgia/arthralgia  Skin: No  Rash  Neurologic: No  weakness, No  dizziness  Exam:  BP 139/72 (BP Location: Left Arm, Patient Position: Sitting, Cuff Size: Large)   Pulse 91   Temp 97.8 F (36.6 C) (Oral)   Wt 298 lb 8 oz (135.4 kg)   LMP 07/07/2018 (Exact Date)   SpO2 100%   BMI 52.05 kg/m   Constitutional: VS see above. General Appearance: alert, well-developed, well-nourished, NAD  Eyes: Normal lids and conjunctive, non-icteric sclera  Ears, Nose, Mouth, Throat: MMM, Normal external inspection ears/nares/mouth/lips/gums. Pharynx/tonsils no erythema, no exudate. Nasal mucosa normal.   Neck: No masses, trachea midline. No tenderness/mass appreciated. No lymphadenopathy  Respiratory: Normal respiratory effort. +wheeze more on R throughout lung, no rhonchi, no rales  Cardiovascular: S1/S2 normal, no murmur, no rub/gallop auscultated. RRR. No lower extremity edema.   Musculoskeletal: Gait normal.   Neurological: Normal balance/coordination. No tremor.   Skin: warm, dry, intact.   Psychiatric: Normal judgment/insight. Normal mood and affect.   CXR personally reviewed: I don't appreciate any consolidation or infiltrate, compared w/ pre-op CXR. Await radiology over-read   No results found.       ASSESSMENT/PLAN: The primary encounter diagnosis was Mild persistent asthma with exacerbation. Diagnoses of Cough and Bronchitis were also pertinent to this visit.  CXR ok, I don't really suspect HCAP or CAP, wheezing more likely  asthma exacerbation, fill printed z-pack if needed if no better w/ steroid, or if radiology has concerns on CXR   Meds ordered this encounter  Medications  . DISCONTD: predniSONE (DELTASONE) 20 MG tablet    Sig: Take 1 tablet (20 mg total) by mouth 2 (two) times daily with a meal.    Dispense:  10 tablet    Refill:  0  . azithromycin (ZITHROMAX) 250 MG tablet    Sig: 2 tabs po on Day 1, then 1 tab daily Days 2 - 5    Dispense:  6 tablet    Refill:  0  . DISCONTD: guaiFENesin-codeine 100-10 MG/5ML syrup    Sig: Take 5-10 mLs by mouth every 4 (four) hours as needed for cough.    Dispense:  118 mL    Refill:  0  . guaiFENesin-codeine 100-10 MG/5ML syrup    Sig: Take 5-10 mLs by mouth every 4 (four) hours as needed for cough.    Dispense:  118 mL    Refill:  0  . predniSONE (DELTASONE) 20 MG tablet    Sig: Take 1 tablet (20 mg  total) by mouth 2 (two) times daily with a meal.    Dispense:  10 tablet    Refill:  0    Orders Placed This Encounter  Procedures  . DG Chest 2 View       Visit summary with medication list and pertinent instructions was printed for patient to review. All questions at time of visit were answered - patient instructed to contact office with any additional concerns or updates. ER/RTC precautions were reviewed with the patient.   Follow-up plan: Return if symptoms worsen or fail to improve.   Please note: voice recognition software was used to produce this document, and typos may escape review. Please contact Dr. Lyn Hollingshead for any needed clarifications.

## 2018-08-05 ENCOUNTER — Encounter: Payer: Self-pay | Admitting: Neurology

## 2018-08-05 ENCOUNTER — Ambulatory Visit: Payer: 59 | Admitting: Neurology

## 2018-08-05 ENCOUNTER — Encounter: Payer: 59 | Attending: Surgery | Admitting: Skilled Nursing Facility1

## 2018-08-05 VITALS — BP 138/88 | HR 96 | Ht 63.5 in | Wt 300.0 lb

## 2018-08-05 DIAGNOSIS — E669 Obesity, unspecified: Secondary | ICD-10-CM | POA: Insufficient documentation

## 2018-08-05 DIAGNOSIS — G4733 Obstructive sleep apnea (adult) (pediatric): Secondary | ICD-10-CM | POA: Diagnosis not present

## 2018-08-05 DIAGNOSIS — Z9884 Bariatric surgery status: Secondary | ICD-10-CM | POA: Diagnosis not present

## 2018-08-05 DIAGNOSIS — R634 Abnormal weight loss: Secondary | ICD-10-CM | POA: Diagnosis not present

## 2018-08-05 DIAGNOSIS — Z713 Dietary counseling and surveillance: Secondary | ICD-10-CM | POA: Diagnosis not present

## 2018-08-05 NOTE — Progress Notes (Signed)
Subjective:    Patient ID: Michaela Holland is a 37 y.o. female.  HPI     Interim history:   Michaela Holland is a 37 year old right-handed woman with an underlying medical history of obstructive sleep apnea, vitamin D deficiency, seasonal allergies, PCO S, osteoarthritis, hypothyroidism, morbid obesity with a BMI of over 50, hypertension, reflux disease, gout, diabetes, depression, anxiety, and back pain, who presents for follow-up consultation of her obstructive sleep apnea, after her baseline sleep study from 05/11/2018 and starting AutoPap therapy. The patient is unaccompanied today. I last saw her on 04/24/2018, at which time we talked about her prior diagnosis of OSA. She was agreeable to pursuing sleep study testing. She had a baseline sleep study on 05/11/2018 which showed mild or borderline sleep apnea with an AHI of 5.5 per hour, supine AHI of 10.6 per hour, REM AHI of 25.8 per hour, average oxygen saturation of 96%, nadir of 89%. She was in the process of pursuing bariatric surgery and was therefore advised to start AutoPap therapy.  Date, 08/05/2018: I reviewed her AutoPap compliance data for the past 30 days, during which time she used her AutoPap only 4 days, indicating non-usage. Since set up on 06/04/2018 she has been using her AutoPap infrequently for a total of 14 out of 61 days. Of note, she had interim gastric sleeve bariatric surgery on 07/22/2018. She reports struggling with usage, but would like to continue. She has had mask fit issues, she has received a new type of mask. She has lost weight already and feels better. She would like to continue with the autoPAP for now.   Previously:  04/24/2018: (She) presents for reevaluation of her obstructive sleep apnea. I have seen her for sleep consultation last year. She decided not to pursue CPAP therapy at the time. Unfortunately, since then she has gained quite a bit of weight. She had to have left knee surgery, she had decreased mobility from  that. She also had some medication changes which perhaps contributed to weight gain. She has gained about 30 pounds in the recent past. I reviewed your office note from 03/28/2018. Her sleep study in April 2018 indicated overall mild sleep apnea, moderate during supine sleep. She is currently on gabapentin for residual knee pain. She is also on Celexa, clonazepam as needed for anxiety, and Rexulti. She is sleepy during the day. Her Epworth sleepiness score is 8 out of 24, fatigue score is 46 out of 63. She also reports that in the past maybe about 10 years ago she had an extended EEG and was told that she had some abnormality, she recalls the term pre-seizures. She was not treated for epilepsy. Nevertheless, she was advised to start gabapentin. She was not able to tolerate it at the time and stopped it. She does not have any history of staring spells or convulsions or history of epilepsy in the past. She does have a long-standing history since childhood of enuresis. She has nocturia about once per average night. Sometimes she sets an alarm to go to the bathroom more her husband may wake her up so she can go to the bathroom at night. She is pursuing bariatric surgery. She is hoping for a surgery date in October 2019.   The patient's allergies, current medications, family history, past medical history, past social history, past surgical history and problem list were reviewed and updated as appropriate.    Previously (copied from previous notes for reference):    05/29/2017: Michaela Holland is a  37 year old right-handed woman with an underlying medical history of hypothyroidism, PCOS, anxiety, left knee pain, reflux disease, and morbid obesity with a BMI of over 50, who was recently diagnosed with obstructive sleep apnea. She had a sleep study out of state in [place]. I reviewed her sleep study results from 12/10/2016. Sleep efficiency was 86%, sleep latency 9.5 minutes, REM percentage 19%. Total AHI was 7 per hour, supine  AHI 20.6 per hour, average oxygen saturation 94%, nadir was 89%. She was noted to snore mildly. PLM index was 58 per hour with an associated arousal index of 15.4 per hour. I reviewed your office note from 04/09/2017. Her Epworth sleepiness score is 8 out of 24, fatigue score is 44 out of 63. She works at [employer]. She lives with her husband, they have 1 child. She is a nonsmoker, does not drink alcohol on a regular basis, does not use illicit drugs and drinks caffeine in the form of coffee, 1-2 cups a day. She had restless leg syndrome quite significantly during her pregnancy and was also diagnosed with iron deficiency at the time. She is not aware of her leg movements at night but did have severe PLMS during her sleep study interestingly in April of this year. 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 She has 37-year-old daughter and her husband has a flexible schedule thankfully. Bedtime is around 11 and wake up time during workdays is 5:15 AM, otherwise 7 or 7:30 AM. She does not have night to night nocturia. She does not typically have morning headaches. She does not have a family history of sleep apnea but suspects that her father may have it. Her original complaint that prompted sleep study testing was daytime somnolence and nonrestorative sleep.  Her Past Medical History Is Significant For: Past Medical History:  Diagnosis Date  . Anxiety   . Back pain   . Chronic pain of left knee   . Depression   . Diabetes mellitus without complication (HCC)    denies this diagnosis   . Family history of adverse reaction to anesthesia    per patient ; father was having abdominal surgery and had to be placed on medicine to keep his blood pressure up; he never had any issues with low BP before the surgery "   . Gallbladder problem   . GERD (gastroesophageal reflux disease)   . Gout    believes only occurred once in feet;   . Hypertension    denies this diagnosis   . Hypothyroidism   . Joint pain    . Obesity   . Osteoarthritis   . Palpitations   . PCOS (polycystic ovarian syndrome)   . PONV (postoperative nausea and vomiting)    with c section; no issues with followwing procedures   . Seasonal allergies   . Seizures (HCC)    has had "pre-seizure" activity in the brain but deneis any actual seizures   . Sleep apnea    does not currently use due to insurance   . Vitamin D deficiency     Her Past Surgical History Is Significant For: Past Surgical History:  Procedure Laterality Date  . CESAREAN SECTION    . CHOLECYSTECTOMY    . ESOPHAGOGASTRODUODENOSCOPY  2001 and 2018  . GANGLION CYST EXCISION Left 2014   foot  . KNEE ARTHROSCOPY Left 06/28/2017   Procedure: LEFT KNEE ARTHROSCOPY with debridement;  Surgeon: Kathryne HitchBlackman, Christopher Y, MD;  Location: [facility];  Service: Orthopedics;  Laterality: Left;  .  LAPAROSCOPIC GASTRIC SLEEVE RESECTION N/A 07/22/2018   Procedure: LAPAROSCOPIC GASTRIC SLEEVE RESECTION WITH UPPER ENDO AND ERAS PATHWAY;  Surgeon: Luretha Murphy, MD;  Location: [facility];  Service: General;  Laterality: N/A;    Her Family History Is Significant For: Family History  Problem Relation Age of Onset  . Polycystic ovary syndrome Mother   . Hypothyroidism Mother   . Hyperlipidemia Mother   . Depression Mother   . Obesity Mother   . Polycystic ovary syndrome Sister   . Hypothyroidism Sister   . Hypertension Father   . Hyperlipidemia Father   . Cancer Father   . Depression Father   . Anxiety disorder Father   . Obesity Father   . Asthma Other     Her Social History Is Significant For: Social History   Socioeconomic History  . Marital status: Married    Spouse name: Ree Kida Brossart  . Number of children: 1  . Years of education: Not on file  . Highest education level: Not on file  Occupational History  . Occupation: Designer, jewellery  Social Needs  . Financial resource strain: Not on file  . Food insecurity:    Worry: Not on file    Inability: Not on file   . Transportation needs:    Medical: Not on file    Non-medical: Not on file  Tobacco Use  . Smoking status: Never Smoker  . Smokeless tobacco: Never Used  Substance and Sexual Activity  . Alcohol use: Never    Frequency: Never  . Drug use: No  . Sexual activity: Yes    Partners: Male    Birth control/protection: None  Lifestyle  . Physical activity:    Days per week: Not on file    Minutes per session: Not on file  . Stress: Not on file  Relationships  . Social connections:    Talks on phone: Not on file    Gets together: Not on file    Attends religious service: Not on file    Active member of club or organization: Not on file    Attends meetings of clubs or organizations: Not on file    Relationship status: Not on file  Other Topics Concern  . Not on file  Social History Narrative  . Not on file    Her Allergies Are:  Allergies  Allergen Reactions  . Sulfa Antibiotics Hives and Shortness Of Breath  . Other     Nuts severe reaction difficulty breathing , throat swelling; mostly tree nuts but not all tree nuts   . Adhesive [Tape] Rash  :   Her Current Medications Are:  Outpatient Encounter Medications as of 08/05/2018  Medication Sig  . acetaminophen-codeine (TYLENOL #3) 300-30 MG tablet Take 1-2 tablets by mouth every 8 (eight) hours as needed for moderate pain. (Patient taking differently: Take 1 tablet by mouth every 8 (eight) hours as needed for moderate pain. )  . albuterol (PROVENTIL HFA;VENTOLIN HFA) 108 (90 Base) MCG/ACT inhaler Inhale 1-2 puffs into the lungs every 4 (four) hours as needed for wheezing or shortness of breath (bronchospasm).  Marland Kitchen azithromycin (ZITHROMAX) 250 MG tablet 2 tabs po on Day 1, then 1 tab daily Days 2 - 5  . calcium-vitamin D (OSCAL WITH D) 500-200 MG-UNIT tablet Take 1 tablet by mouth 3 (three) times daily.  . citalopram (CELEXA) 40 MG tablet Take 1 tablet (40 mg total) by mouth daily.  . clonazePAM (KLONOPIN) 0.5 MG tablet Take  0.5-1 tablets (0.25-0.5 mg  total) by mouth 2 (two) times daily as needed for anxiety (#30 for 90 days.).  Marland Kitchen EPINEPHrine 0.3 mg/0.3 mL IJ SOAJ injection Inject 0.3 mg into the muscle daily as needed (for anaphylactic reactions.).   Marland Kitchen gabapentin (NEURONTIN) 300 MG capsule Take 2 capsules (600 mg total) by mouth 2 (two) times daily. Morning & afternoon.  Marland Kitchen guaiFENesin-codeine 100-10 MG/5ML syrup Take 5-10 mLs by mouth every 4 (four) hours as needed for cough.  . levothyroxine (SYNTHROID, LEVOTHROID) 175 MCG tablet Take 1 tablet (175 mcg total) by mouth daily before breakfast.  . meclizine (ANTIVERT) 25 MG tablet Take 1 tablet (25 mg total) by mouth 3 (three) times daily as needed for dizziness.  . metFORMIN (GLUCOPHAGE-XR) 500 MG 24 hr tablet Take 2 tablets (1,000 mg total) by mouth at bedtime.  . methylphenidate (RITALIN) 10 MG tablet Take 15 mg by mouth 2 (two) times daily.  . Multiple Vitamins-Minerals (ADULT GUMMY PO) Take 2 tablets by mouth at bedtime.  . Multiple Vitamins-Minerals (BARIATRIC FUSION) CHEW Chew 1 tablet by mouth 2 (two) times daily.  . ondansetron (ZOFRAN-ODT) 4 MG disintegrating tablet Take 4 mg by mouth 3 (three) times daily as needed for nausea/vomiting.  Marland Kitchen oxyCODONE (ROXICODONE) 5 MG/5ML solution Take 5-10 mLs by mouth every 6 (six) hours as needed for pain.  . pantoprazole (PROTONIX) 40 MG tablet Take 40 mg by mouth daily.  . predniSONE (DELTASONE) 20 MG tablet Take 1 tablet (20 mg total) by mouth 2 (two) times daily with a meal.  . Turmeric 500 MG CAPS Take 1,000 mg by mouth at bedtime.  . vitamin B-12 (CYANOCOBALAMIN) 500 MCG tablet Take 500 mcg by mouth daily.  Marland Kitchen VITAMIN D PO Take by mouth.  . [DISCONTINUED] Brexpiprazole (REXULTI) 1 MG TABS Take 1 tablet (1 mg total) by mouth every morning.  . [DISCONTINUED] Vitamin D, Ergocalciferol, (DRISDOL) 50000 units CAPS capsule TAKE 1 CAPSULE BY MOUTH EVERY 7 DAYS   No facility-administered encounter medications on file as of  08/05/2018.   :  Review of Systems:  Out of a complete 14 point review of systems, all are reviewed and negative with the exception of these symptoms as listed below:  Review of Systems  Neurological:       Pt presents today for follow up on her auto pap. Pt reports that she takes it off in the middle of the night unknowingly. Pt is still trying to adjust to her cpap.    Objective:  Neurological Exam  Physical Exam Physical Examination:   Vitals:   08/05/18 1148  BP: 138/88  Pulse: 96    General Examination: The patient is a very pleasant 37 y.o. female in no acute distress. She appears well-developed and well-nourished and well groomed.   HEENT:Normocephalic, atraumatic, pupils are equal, round and reactive to light and accommodation. Extraocular tracking is good without limitation to gaze excursion or nystagmus noted. Normal smooth pursuit is noted. Hearing is grossly intact. Face is symmetric with normal facial animation and normal facial sensation. Speech is clear with no dysarthria noted. There is no hypophonia. There is no lip, neck/head, jaw or voice tremor. Neck shows FROM. Oropharynx exam reveals: mildmouth dryness, gooddental hygiene and moderateairway crowding. Mallampati is classII. Tongue protrudes centrally and palate elevates symmetrically.   Chest:Clear to auscultation without wheezing, rhonchi or crackles noted.  Heart:S1+S2+0, regular and normal without murmurs, rubs or gallops noted.   Abdomen:Soft, non-tender and non-distended.  Extremities:There isnopitting edema in the distal lower extremities bilaterally.  Skin: Warm and dry without trophic changes noted.  Musculoskeletal: exam reveals no obvious joint deformities, tenderness or joint swelling or erythema, with the exception of some discomfort left knee, also status post ganglion cyst removal L foot.   Neurologically:  Mental status: The patient is awake, alert and oriented in all 4  spheres.Herimmediate and remote memory, attention, language skills and fund of knowledge are appropriate. There is no evidence of aphasia, agnosia, apraxia or anomia. Speech is clear with normal prosody and enunciation. Thought process is linear. Mood is normaland affect is normal.  Cranial nerves II - XII are as described above under HEENT exam. Motor exam: Normal bulk, strength and tone is noted. There is no tremor. Fine motor skills and coordination: intact grossly.  Cerebellar testing: No dysmetria or intention tremor. There is no truncal or gait ataxia.  Sensory exam: intact to light touchin the upper and lower extremities.  Gait, station and balance:Shestands easily. No veering to one side is noted. No leaning to one side is noted. Posture is age-appropriate and stance is narrow based. Gait showsnormalstride length and normalpace. No problems turning are noted.   Assessmentand Plan:  In summary,Michaela K Hanksis a very pleasant 37 year oldfemalewith an underlying medical history of hypothyroidism, PCO S, anxiety, left knee pain, reflux disease, and morbid obesity with a BMI of over 50, who presents for follow-up consultation of her sleep obstructive sleep apnea which was mild to borderline by recent home sleep testing in September 2019. She had recent gastric sleeve bariatric surgery and has already been able to lose some weight. She would like to continue with AutoPap therapy for now but is advised that if her insurance requires compliance criteria to be fulfilled in the first 90 days, she may not be able to be fully compliant by insurance criteria. Nevertheless, we mutually agreed to continue with AutoPap therapy. I suggested a routine follow-up in 6 months, she can see one of our nurse practitioners, we will track her progress and weight loss as well. We may consider a repeat on sleep test at the next visit. I answered all her questions today and she was in agreement. I spent 20 minutes  in total face-to-face time with the patient, more than 50% of which was spent in counseling and coordination of care, reviewing test results, reviewing medication and discussing or reviewing the diagnosis of OSA, its prognosis and treatment options. Pertinent laboratory and imaging test results that were available during this visit with the patient were reviewed by me and considered in my medical decision making (see chart for details).

## 2018-08-05 NOTE — Patient Instructions (Addendum)
I am sorry, you have had issues with the autoPAP. You can continue using it, keep in mind, your insurance may require that you use PAP at least 4 hours each night on 70% of the nights, especially in the first 90 days of set up.   I am glad you are feeling better already.   Keep trying the autoPAP. Follow up with Megan in 6 months, we may consider a home sleep test.

## 2018-08-06 ENCOUNTER — Encounter: Payer: Self-pay | Admitting: Skilled Nursing Facility1

## 2018-08-06 NOTE — Progress Notes (Signed)
Bariatric Class:  Appt start time: 1530 end time:  1630.  2 Week Post-Operative Nutrition Class  Patient was seen on 08/05/2018 for Post-Operative Nutrition education at [facility].   Surgery date: 07/22/2018 Surgery type: Sleeve Start weight at [facility]: 322.5 Weight today: 300.2  TANITA  BODY COMP RESULTS  08/05/2018   BMI (kg/m^2) 52.3   Fat Mass (lbs) 165.2   Fat Free Mass (lbs) 135   Total Body Water (lbs) 101.2   The following the learning objectives were met by the patient during this course:  Identifies Phase 3A (Soft, High Proteins) Dietary Goals and will begin from 2 weeks post-operatively to 2 months post-operatively  Identifies appropriate sources of fluids and proteins   States protein recommendations and appropriate sources post-operatively  Identifies the need for appropriate texture modifications, mastication, and bite sizes when consuming solids  Identifies appropriate multivitamin and calcium sources post-operatively  Describes the need for physical activity post-operatively and will follow MD recommendations  States when to call healthcare provider regarding medication questions or post-operative complications  Handouts given during class include:  Phase 3A: Soft, High Protein Diet Handout  Follow-Up Plan: Patient will follow-up at [facility] in 6 weeks for 2 month post-op nutrition visit for diet advancement per MD.

## 2018-08-11 ENCOUNTER — Telehealth: Payer: Self-pay | Admitting: Skilled Nursing Facility1

## 2018-08-11 NOTE — Telephone Encounter (Signed)
RD called pt to verify fluid intake once starting soft, solid proteins 2 week post-bariatric surgery.   Daily Fluid intake: 64+ Daily Protein intake: 60+  Concerns/issues:  Pt states she has been fighting off bronchitis.

## 2018-08-13 ENCOUNTER — Ambulatory Visit (INDEPENDENT_AMBULATORY_CARE_PROVIDER_SITE_OTHER): Payer: 59 | Admitting: Psychiatry

## 2018-08-13 DIAGNOSIS — F509 Eating disorder, unspecified: Secondary | ICD-10-CM | POA: Diagnosis not present

## 2018-08-15 ENCOUNTER — Encounter: Payer: Self-pay | Admitting: Osteopathic Medicine

## 2018-08-15 MED ORDER — NORELGESTROMIN-ETH ESTRADIOL 150-35 MCG/24HR TD PTWK: 1.0000 | MEDICATED_PATCH | TRANSDERMAL | 4 refills | Status: DC

## 2018-08-15 NOTE — Addendum Note (Signed)
Addended by: Deirdre PippinsALEXANDER, Valda Christenson M on: 08/15/2018 10:45 AM   Modules accepted: Orders

## 2018-08-18 IMAGING — MR MR KNEE*L* W/O CM
8 series · 40 of 40 positions shown · non-contrast
Comparison: None.

CLINICAL DATA: Diffuse left knee pain x2 months after walking on
uneven ground.

EXAM:
MRI OF THE LEFT KNEE WITHOUT CONTRAST
TECHNIQUE: Multiplanar, multisequence MR imaging of the knee was performed. No
intravenous contrast was administered.

[Series 3: PD fat-sat · axial · 4.0mm · 0.70mm/px · z∈[-122,-8]mm · 6 of 24 slices shown (1 of 4)]
[im 1/24]
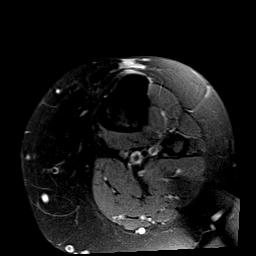
[im 5/24]
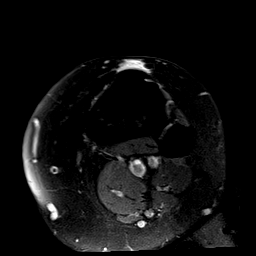
[im 10/24]
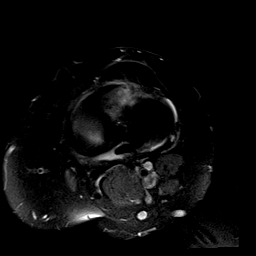
[im 14/24]
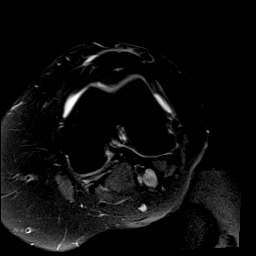
[im 19/24]
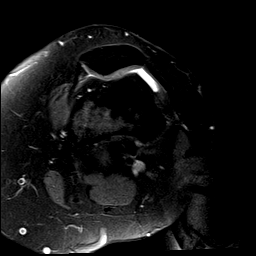
[im 24/24]
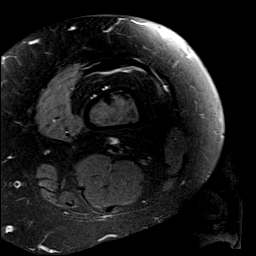

[Series 4: T1 · coronal · 4.0mm · 0.70mm/px · 6 of 25 slices shown]
[im 1/25]
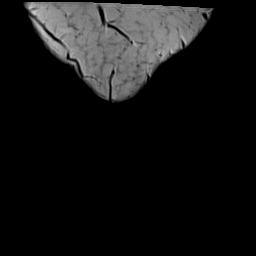
[im 5/25]
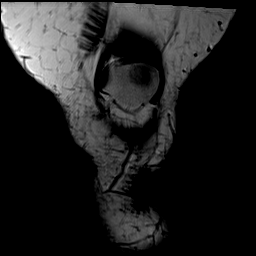
[im 10/25]
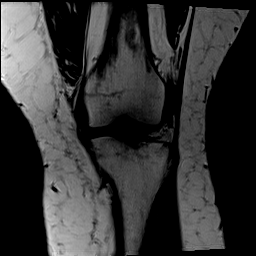
[im 15/25]
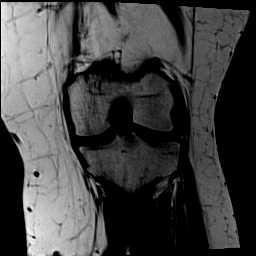
[im 20/25]
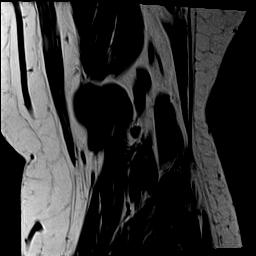
[im 25/25]
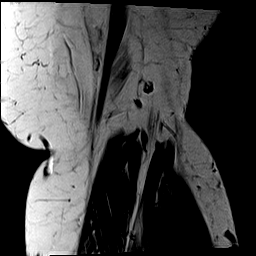

[Series 5: PD fat-sat · sagittal · 4.0mm · 0.70mm/px · 6 of 23 slices shown (2 of 4)]
[im 1/23]
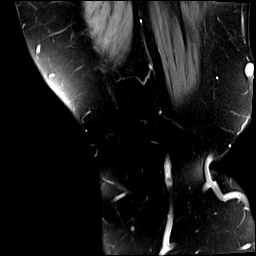
[im 5/23]
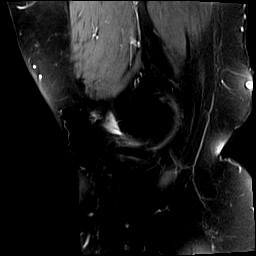
[im 9/23]
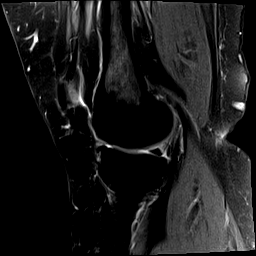
[im 14/23]
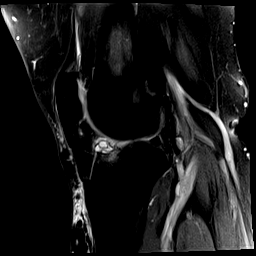
[im 18/23]
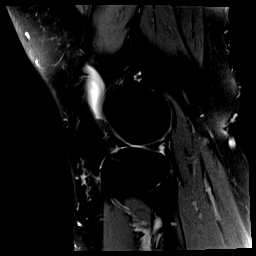
[im 23/23]
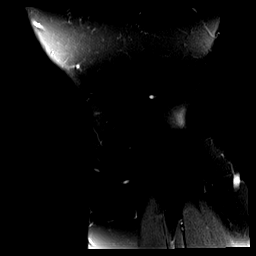

[Series 6: PD fat-sat · coronal · 4.0mm · 0.70mm/px · 6 of 25 slices shown (3 of 4)]
[im 1/25]
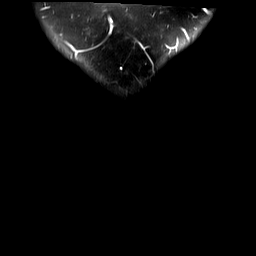
[im 5/25]
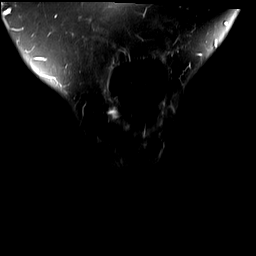
[im 10/25]
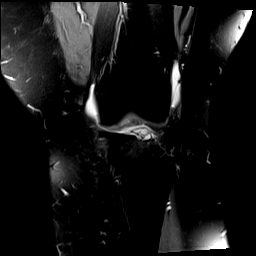
[im 15/25]
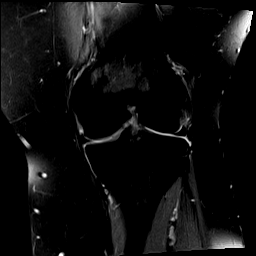
[im 20/25]
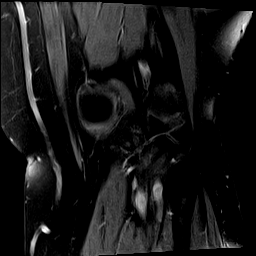
[im 25/25]
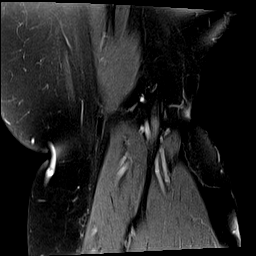

[Series 7: T2 fat-sat · coronal · 4.0mm · 0.70mm/px · 6 of 24 slices shown]
[im 1/24]
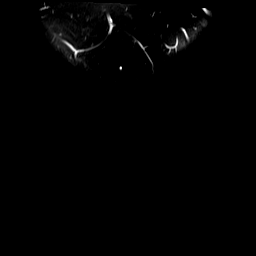
[im 5/24]
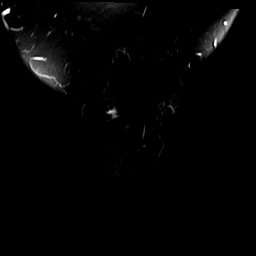
[im 10/24]
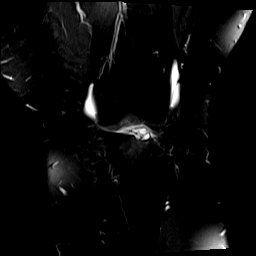
[im 14/24]
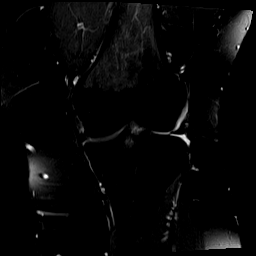
[im 19/24]
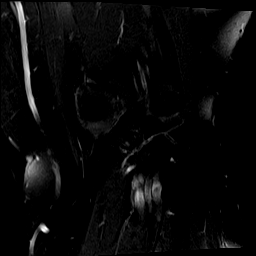
[im 24/24]
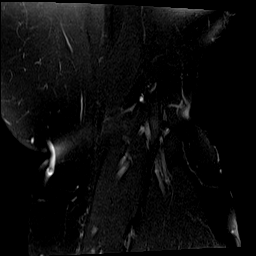

[Series 8: PD · oblique · 2.0mm · 0.62mm/px · 4 of 15 slices shown]
[im 1/15]
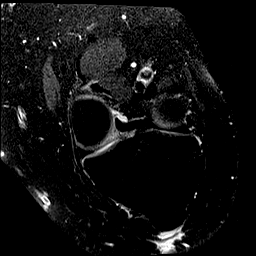
[im 5/15]
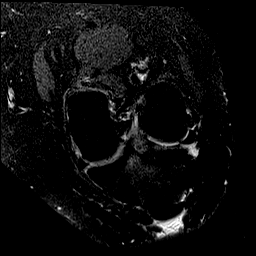
[im 10/15]
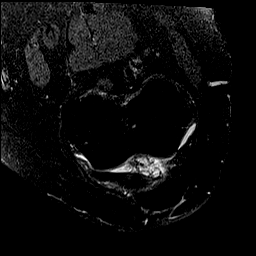
[im 15/15]
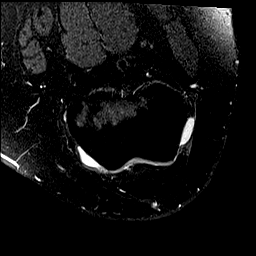

[Series 9: PD fat-sat · axial · 4.0mm · 0.70mm/px · z∈[-121,-18]mm · 5 of 22 slices shown (4 of 4)]
[im 1/22]
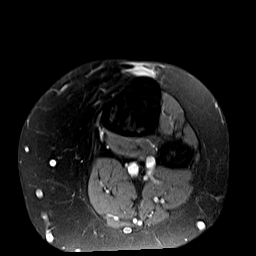
[im 6/22]
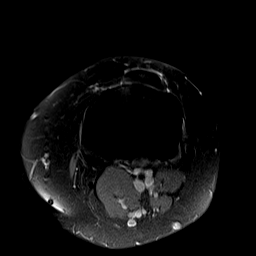
[im 11/22]
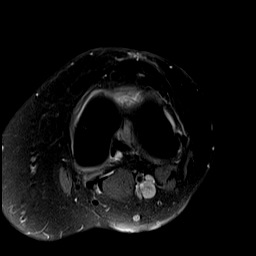
[im 16/22]
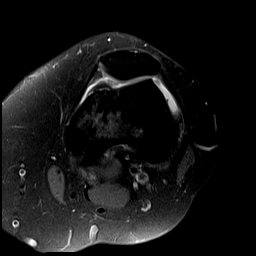
[im 22/22]
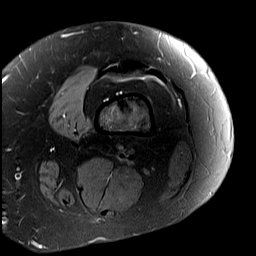

[Series 5002: hx · axial · 8.0mm · 0.68mm/px · 1 of 3 slices shown]
[im 1/3]
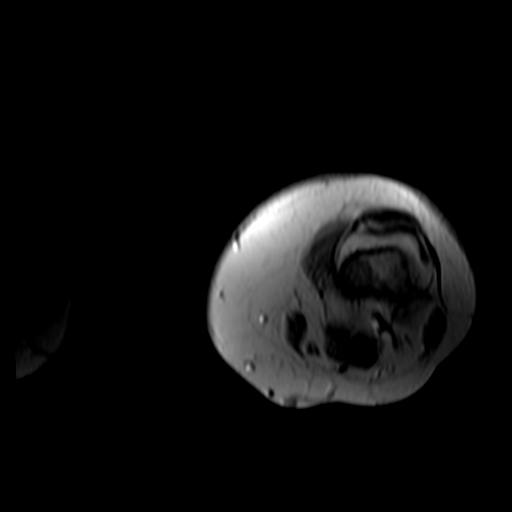

[40 of 40 positions shown; findings below may reference images not displayed]

FINDINGS: MENISCI

Medial meniscus:  Intact.

Lateral meniscus: There is a small cluster of cysts adjacent to the
anteromesial corner of the lateral meniscus, likely representing
parameniscal cysts and inferring a meniscal tear, likely off the
anterior horn no discrete tear is not definitively identified in
this location given the presence normal meniscal fascicles.

LIGAMENTS

Cruciates: Intrasubstance tear of the proximal fibers of the ACL,
series 9, image 10 and series 5, image 11.

Collaterals: Medial collateral ligament is intact. Lateral
collateral ligament complex is intact.

CARTILAGE

Patellofemoral: Mild fraying of the cartilage overlying the lateral
patellar facet.

Medial: Mild partial-thickness cartilage loss of the medial
femorotibial compartment.

Lateral: Moderate-to-marked chondromalacia of the lateral
femorotibial compartment down to bone.

Joint:  No joint effusion. Normal Hoffa's fat. No plical thickening.

Popliteal Fossa: Trace fluid in the expected location of a Baker's
cyst. Intact popliteus tendon.

Extensor Mechanism:  Intact quadriceps tendon and patellar tendon.

Bones:  Marrow edema of the anterior tibial plateau.

Other: Prepatellar tendon soft tissue edema.
IMPRESSION: 1. Partial intrasubstance tear of the proximal fibers of the ACL.
2. Anteromesial cluster of parameniscal cysts adjacent to the
anterior horn of the lateral meniscus inferring a tear. Given the
presence of meniscal fascicles, a discrete tear is difficult to
identify.
3. Bone marrow edema of the anteromesial tibial plateau.
4. Chondromalacia of the femorotibial compartment more so laterally.

## 2018-08-20 ENCOUNTER — Other Ambulatory Visit: Payer: Self-pay | Admitting: Endocrinology

## 2018-08-20 ENCOUNTER — Telehealth: Payer: Self-pay | Admitting: Skilled Nursing Facility1

## 2018-08-20 MED FILL — XULANE PATCH: 150-35 | 84 days supply | Qty: 9 | Fill #0

## 2018-08-20 NOTE — Telephone Encounter (Signed)
Pt called asking for dietitian to leave her a voicemail with her body fat mass and the desirable range.    Dietitian did this.

## 2018-08-20 NOTE — Telephone Encounter (Signed)
Please refill x 1 Ov is due  

## 2018-08-20 NOTE — Telephone Encounter (Signed)
Last OV 07/11/17 refill or refuse please advise

## 2018-08-21 ENCOUNTER — Other Ambulatory Visit: Payer: Self-pay

## 2018-08-21 ENCOUNTER — Other Ambulatory Visit: Payer: Self-pay | Admitting: Endocrinology

## 2018-08-21 MED ORDER — LEVOTHYROXINE SODIUM 175 MCG PO TABS: 175.0000 ug | ORAL_TABLET | Freq: Every day | ORAL | 0 refills | Status: DC

## 2018-08-21 MED FILL — LEVOTHYROXINE 175 MCG TAB: 175 | 90 days supply | Qty: 90 | Fill #0

## 2018-08-26 ENCOUNTER — Other Ambulatory Visit: Payer: Self-pay | Admitting: Family Medicine

## 2018-08-26 ENCOUNTER — Encounter: Payer: Self-pay | Admitting: Family Medicine

## 2018-08-26 MED FILL — PANTOPRAZOLE SOD DR 40 MG T: 40 | 30 days supply | Qty: 30 | Fill #1

## 2018-08-26 MED FILL — GABAPENTIN 300 MG CAPSULE: 300 | 30 days supply | Qty: 120 | Fill #0

## 2018-08-28 DIAGNOSIS — F900 Attention-deficit hyperactivity disorder, predominantly inattentive type: Secondary | ICD-10-CM | POA: Diagnosis not present

## 2018-08-28 DIAGNOSIS — F3341 Major depressive disorder, recurrent, in partial remission: Secondary | ICD-10-CM | POA: Diagnosis not present

## 2018-09-03 DIAGNOSIS — G4733 Obstructive sleep apnea (adult) (pediatric): Secondary | ICD-10-CM | POA: Diagnosis not present

## 2018-09-05 ENCOUNTER — Encounter (INDEPENDENT_AMBULATORY_CARE_PROVIDER_SITE_OTHER): Payer: Self-pay | Admitting: Orthopaedic Surgery

## 2018-09-10 ENCOUNTER — Ambulatory Visit (INDEPENDENT_AMBULATORY_CARE_PROVIDER_SITE_OTHER): Payer: Self-pay | Admitting: Orthopaedic Surgery

## 2018-09-11 ENCOUNTER — Encounter (INDEPENDENT_AMBULATORY_CARE_PROVIDER_SITE_OTHER): Payer: Self-pay | Admitting: Orthopaedic Surgery

## 2018-09-11 ENCOUNTER — Ambulatory Visit (INDEPENDENT_AMBULATORY_CARE_PROVIDER_SITE_OTHER): Payer: BLUE CROSS/BLUE SHIELD | Admitting: Orthopaedic Surgery

## 2018-09-11 ENCOUNTER — Ambulatory Visit (INDEPENDENT_AMBULATORY_CARE_PROVIDER_SITE_OTHER): Payer: BLUE CROSS/BLUE SHIELD | Admitting: Psychiatry

## 2018-09-11 ENCOUNTER — Telehealth (INDEPENDENT_AMBULATORY_CARE_PROVIDER_SITE_OTHER): Payer: Self-pay | Admitting: Orthopaedic Surgery

## 2018-09-11 DIAGNOSIS — M25562 Pain in left knee: Secondary | ICD-10-CM

## 2018-09-11 DIAGNOSIS — G8929 Other chronic pain: Secondary | ICD-10-CM | POA: Diagnosis not present

## 2018-09-11 DIAGNOSIS — F509 Eating disorder, unspecified: Secondary | ICD-10-CM

## 2018-09-11 DIAGNOSIS — F4323 Adjustment disorder with mixed anxiety and depressed mood: Secondary | ICD-10-CM

## 2018-09-11 DIAGNOSIS — M1711 Unilateral primary osteoarthritis, right knee: Secondary | ICD-10-CM

## 2018-09-11 DIAGNOSIS — M1712 Unilateral primary osteoarthritis, left knee: Secondary | ICD-10-CM

## 2018-09-11 DIAGNOSIS — M25561 Pain in right knee: Secondary | ICD-10-CM | POA: Diagnosis not present

## 2018-09-11 MED ORDER — LIDOCAINE HCL 1 % IJ SOLN
3.0000 mL | INTRAMUSCULAR | Status: AC | PRN
  Administered 2018-09-11: 3 mL

## 2018-09-11 MED ORDER — METHYLPREDNISOLONE ACETATE 40 MG/ML IJ SUSP
40.0000 mg | INTRAMUSCULAR | Status: AC | PRN
  Administered 2018-09-11: 40 mg via INTRA_ARTICULAR

## 2018-09-11 NOTE — Progress Notes (Signed)
Office Visit Note   Patient: Michaela Holland           Date of Birth: December 19, 1980           MRN: [id number] Visit Date: 09/11/2018              Requested by: Sunnie NielsenAlexander, Natalie, DO [address] [place], [place] [id number] PCP: Sunnie NielsenAlexander, Natalie, DO   Assessment & Plan: Visit Diagnoses:  1. Unilateral primary osteoarthritis, left knee   2. Chronic pain of left knee   3. Chronic pain of right knee   4. Unilateral primary osteoarthritis, right knee     Plan: I was able to place a steroid injection in both knees today and she tolerated this well.  We will schedule her to come back in 4 weeks from now for hyaluronic acid for both knees.  I do feel that weight loss is can help her knees as well as quad training exercises.  I also feel that she is still permanently disabled as a relates to the stress on both her knees with the severe pain she is experiencing in both knees.  She is still mainly homebound due to her knee pain and weakness as well as her obesity.  We will continue to reevaluate her.  Again we will see her back in 4 weeks.  Follow-Up Instructions: Return in about 4 weeks (around 10/09/2018).   Orders:  Orders Placed This Encounter  Procedures  . Large Joint Inj  . Large Joint Inj   No orders of the defined types were placed in this encounter.     Procedures: Large Joint Inj: R knee on 09/11/2018 1:34 PM Indications: diagnostic evaluation and pain Details: 22 G 1.5 in needle, superolateral approach  Arthrogram: No  Medications: 3 mL lidocaine 1 %; 40 mg methylPREDNISolone acetate 40 MG/ML Outcome: tolerated well, no immediate complications Procedure, treatment alternatives, risks and benefits explained, specific risks discussed. Consent was given by the patient. Immediately prior to procedure a time out was called to verify the correct patient, procedure, equipment, support staff and site/side marked as required. Patient was prepped and draped in the usual  sterile fashion.   Large Joint Inj: L knee on 09/11/2018 1:35 PM Indications: diagnostic evaluation and pain Details: 22 G 1.5 in needle, superolateral approach  Arthrogram: No  Medications: 3 mL lidocaine 1 %; 40 mg methylPREDNISolone acetate 40 MG/ML Outcome: tolerated well, no immediate complications Procedure, treatment alternatives, risks and benefits explained, specific risks discussed. Consent was given by the patient. Immediately prior to procedure a time out was called to verify the correct patient, procedure, equipment, support staff and site/side marked as required. Patient was prepped and draped in the usual sterile fashion.       Clinical Data: No additional findings.   Subjective: Chief Complaint  Patient presents with  . Left Knee - Follow-up  The patient is here for continued follow-up of significant bilateral knee pain with left worse than right.  She has known significant medial compartment arthritis of the left knee.  She has been recovering recently from gastric sleeve surgery.  She is already lost 50 pounds.  She would like to have steroid injection in both knees today and consider hyaluronic acid again as well for both knees.  HPI  Review of Systems She currently denies any headache, chest pain, shortness of breath, fever, chills, nausea, vomiting.  Objective: Vital Signs: There were no vitals taken for this visit.  Physical Exam She  is alert and orient x3 in no acute distress Ortho Exam Examination of both knees show no effusion.  Both knees hyperextend and have significant medial joint tenderness and patellofemoral crepitation. Specialty Comments:  No specialty comments available.  Imaging: No results found.   PMFS History: Patient Active Problem List   Diagnosis Date Noted  . Chronic pain of right knee 09/11/2018  . Unilateral primary osteoarthritis, right knee 09/11/2018  . S/P laparoscopic sleeve gastrectomyNov2019 07/22/2018  . Closed  nondisplaced fracture of proximal phalanx of lesser toe of left foot 06/04/2018  . Overactive bladder 03/28/2018  . Urinary incontinence, nocturnal enuresis 03/28/2018  . Unilateral primary osteoarthritis, left knee 02/24/2018  . H/O arthroscopy of left knee 02/24/2018  . Baker cyst, left 01/01/2018  . Other fatigue 10/22/2017  . Shortness of breath on exertion 10/22/2017  . Vitamin D deficiency 10/22/2017  . S/P arthroscopy of left knee 07/18/2017  . Acute pain of left knee 06/17/2017  . Meniscal cyst, left 06/17/2017  . Cyst of lateral meniscus of left knee 05/08/2017  . Anterior cruciate ligament sprain 05/08/2017  . Morbid obesity (HCC) 05/08/2017  . History of obstructive sleep apnea 04/09/2017  . Hypothyroidism 04/09/2017  . PCOS (polycystic ovarian syndrome) 04/09/2017  . Anxiety 04/09/2017  . Chronic pain of left knee 04/09/2017  . Gastroesophageal reflux disease with esophagitis 04/09/2017   Past Medical History:  Diagnosis Date  . Anxiety   . Back pain   . Chronic pain of left knee   . Depression   . Diabetes mellitus without complication (HCC)    denies this diagnosis   . Family history of adverse reaction to anesthesia    per patient ; father was having abdominal surgery and had to be placed on medicine to keep his blood pressure up; he never had any issues with low BP before the surgery "   . Gallbladder problem   . GERD (gastroesophageal reflux disease)   . Gout    believes only occurred once in feet;   . Hypertension    denies this diagnosis   . Hypothyroidism   . Joint pain   . Obesity   . Osteoarthritis   . Palpitations   . PCOS (polycystic ovarian syndrome)   . PONV (postoperative nausea and vomiting)    with c section; no issues with followwing procedures   . Seasonal allergies   . Seizures (HCC)    has had "pre-seizure" activity in the brain but deneis any actual seizures   . Sleep apnea    does not currently use due to insurance   . Vitamin D  deficiency     Family History  Problem Relation Age of Onset  . Polycystic ovary syndrome Mother   . Hypothyroidism Mother   . Hyperlipidemia Mother   . Depression Mother   . Obesity Mother   . Polycystic ovary syndrome Sister   . Hypothyroidism Sister   . Hypertension Father   . Hyperlipidemia Father   . Cancer Father   . Depression Father   . Anxiety disorder Father   . Obesity Father   . Asthma Other     Past Surgical History:  Procedure Laterality Date  . CESAREAN SECTION    . CHOLECYSTECTOMY    . ESOPHAGOGASTRODUODENOSCOPY  2001 and 2018  . GANGLION CYST EXCISION Left 2014   foot  . KNEE ARTHROSCOPY Left 06/28/2017   Procedure: LEFT KNEE ARTHROSCOPY with debridement;  Surgeon: Kathryne HitchBlackman, Madaline Lefeber Y, MD;  Location: [facility];  Service:  Orthopedics;  Laterality: Left;  . LAPAROSCOPIC GASTRIC SLEEVE RESECTION N/A 07/22/2018   Procedure: LAPAROSCOPIC GASTRIC SLEEVE RESECTION WITH UPPER ENDO AND ERAS PATHWAY;  Surgeon: Luretha Murphy, MD;  Location: [facility];  Service: General;  Laterality: N/A;   Social History   Occupational History  . Occupation: Designer, jewellery  Tobacco Use  . Smoking status: Never Smoker  . Smokeless tobacco: Never Used  Substance and Sexual Activity  . Alcohol use: Never    Frequency: Never  . Drug use: No  . Sexual activity: Yes    Partners: Male    Birth control/protection: None

## 2018-09-11 NOTE — Telephone Encounter (Signed)
Per patient Dr. Magnus Ivan wants her to have a gel injection.  Please forward request to April-per April.  Thank you.  Advised patient she will be called once auth through insurance with appointment.

## 2018-09-11 NOTE — Telephone Encounter (Signed)
Noted  

## 2018-09-18 ENCOUNTER — Encounter: Payer: Self-pay | Admitting: Skilled Nursing Facility1

## 2018-09-18 ENCOUNTER — Encounter: Payer: BLUE CROSS/BLUE SHIELD | Attending: Surgery | Admitting: Skilled Nursing Facility1

## 2018-09-18 DIAGNOSIS — E669 Obesity, unspecified: Secondary | ICD-10-CM | POA: Diagnosis not present

## 2018-09-18 DIAGNOSIS — Z713 Dietary counseling and surveillance: Secondary | ICD-10-CM | POA: Insufficient documentation

## 2018-09-18 NOTE — Progress Notes (Signed)
Follow-up visit:  8 Weeks Post-Operative Sleeve Surgery  Primary concerns today: Post-operative Bariatric Surgery Nutrition Management.   Pt states she has not met her fluid goals due to being busy. Pt states is not active due to chasing her 38 year old: Dietitian educated th ept on this topic. Pt state she has fallen off the wagon and given into cravings of carbohydrates. Pt states she has had a lot of emotional ups and downs and is working with her psychologist. Pt states she feels the worst when her husband and her argue and does not want to share her cravings episodes with her her husband because he has had surgery too.    Surgery date: 07/22/2018 Surgery type: Sleeve Start weight at [facility]: 322.5 Weight today:  278.2 Weight change: 22  TANITA  BODY COMP RESULTS  08/05/2018 09/18/2018   BMI (kg/m^2) 52.3 48.5   Fat Mass (lbs) 165.2 148.8   Fat Free Mass (lbs) 135 129.4   Total Body Water (lbs) 101.2 96.4    24-hr recall: B (AM): 1-2 regular coffee with protein shake  Snk (AM): ham and cheese 2 eggs  L (PM): wendys chili or tuna pack  Snk (PM):   D (PM): left over chili or ham and cheese rollup or yogurt Snk (PM): half yogurt   Fluid intake: water, unsweet tea, vitamin zero water: 50-55 Estimated total protein intake: 60+  Medications: See List Supplementation: multi and 2 calcium   Using straws: no Drinking while eating: no Having you been chewing well: yes  Chewing/swallowing difficulties: no Changes in vision: no Changes to mood/headaches: no Hair loss/Cahnges to skin/Changes to nails: no Any difficulty focusing or concentrating: no Sweating: no Dizziness/Lightheaded: no Palpitations: no  Carbonated beverages: no N/V/D/C/GAS: no Abdominal Pain: no Dumping syndrome: no  Recent physical activity:  ADL's  Progress Towards Goal(s):  In progress.  Handouts given during visit include:  Non starchy vegetables + protein    Nutritional Diagnosis:  [place]-3.3  Overweight/obesity related to past poor dietary habits and physical inactivity as evidenced by patient w/ recent Sleeve surgery following dietary guidelines for continued weight loss.    Intervention:  Nutrition counseling. Pts diet was advanced to the next phase now including non starchy vegetables + protein. Due to the bodies need for essential vitamins, minerals, and fats the pt was educated on the need to consume a certain amount of calories as well as certain nutrients daily. Pt was educated on the need for daily physical activity and to reach a goal of at least 150 minutes of moderate to vigorous physical activity as directed by their physician due to such benefits as increased musculature and improved lab values.   Goals: -Continue to aim for a minimum of 64 fluid ounces 7 days a week with at least 30 ounces being plain water -Eat non-starchy vegetables 2 times a day 7 days a week -Start out with soft cooked vegetables today and tomorrow; if tolerated begin to eat raw vegetables or cooked including salads -Eat your 3 ounces of protein first then start in on your non-starchy vegetables; once you understand how much of your meal leads to satisfaction and not full while still eating 3 ounces of protein and non-starchy vegetables you can eat them in any order  -Continue to aim for 30 minutes of activity at least 5 times a week  Teaching Method Utilized:  Visual Auditory Hands on  Barriers to learning/adherence to lifestyle change: children   Demonstrated degree of understanding via:  Teach Back   Monitoring/Evaluation:  Dietary intake, exercise, and body weight. Follow up in 6 weeks

## 2018-09-18 NOTE — Patient Instructions (Signed)
-  Continue to aim for a minimum of 64 fluid ounces 7 days a week with at least 30 ounces being plain water  -Eat non-starchy vegetables 2 times a day 7 days a week  -Start out with soft cooked vegetables today and tomorrow; if tolerated begin to eat raw vegetables or cooked including salads  -Eat your 3 ounces of protein first then start in on your non-starchy vegetables; once you understand how much of your meal leads to satisfaction and not full while still eating 3 ounces of protein and non-starchy vegetables you can eat them in any order   -Continue to aim for 30 minutes of activity at least 5 times a week  

## 2018-09-29 ENCOUNTER — Telehealth (INDEPENDENT_AMBULATORY_CARE_PROVIDER_SITE_OTHER): Payer: Self-pay

## 2018-09-29 NOTE — Telephone Encounter (Signed)
Submitted VOB for SynviscOne, bilateral knee. 

## 2018-09-30 ENCOUNTER — Telehealth (INDEPENDENT_AMBULATORY_CARE_PROVIDER_SITE_OTHER): Payer: Self-pay

## 2018-09-30 NOTE — Telephone Encounter (Signed)
Talked with patient concerning verification of [employer] member ID number.  Patient stated that she will call back with the information.

## 2018-10-08 ENCOUNTER — Telehealth (INDEPENDENT_AMBULATORY_CARE_PROVIDER_SITE_OTHER): Payer: Self-pay | Admitting: Orthopaedic Surgery

## 2018-10-08 NOTE — Telephone Encounter (Signed)
Daraya from [facility] called yesterday requesting for you to put the patient's updated member ID number online for the patient's gel injection.  CB#[phone].  Thank you.

## 2018-10-10 ENCOUNTER — Telehealth (INDEPENDENT_AMBULATORY_CARE_PROVIDER_SITE_OTHER): Payer: Self-pay

## 2018-10-10 NOTE — Telephone Encounter (Signed)
Talked with Dawn at [facility] and advised her that patient has not called back to clarify the correct member ID for [employer].  Stated that case will remain open until insurance information is received.

## 2018-10-10 NOTE — Telephone Encounter (Signed)
Called and left a VM advising patient to call back to verify correct member ID# for [employer] to complete process for gel injection.

## 2018-10-28 ENCOUNTER — Ambulatory Visit (INDEPENDENT_AMBULATORY_CARE_PROVIDER_SITE_OTHER): Payer: BLUE CROSS/BLUE SHIELD | Admitting: Osteopathic Medicine

## 2018-10-28 ENCOUNTER — Encounter: Payer: Self-pay | Admitting: Osteopathic Medicine

## 2018-10-28 ENCOUNTER — Ambulatory Visit: Payer: Self-pay | Admitting: Family Medicine

## 2018-10-28 VITALS — BP 124/61 | HR 72 | Temp 97.8°F | Wt 264.8 lb

## 2018-10-28 DIAGNOSIS — S76112A Strain of left quadriceps muscle, fascia and tendon, initial encounter: Secondary | ICD-10-CM | POA: Diagnosis not present

## 2018-10-28 DIAGNOSIS — M545 Low back pain, unspecified: Secondary | ICD-10-CM

## 2018-10-28 MED ORDER — GABAPENTIN 100 MG PO CAPS: 100.0000 mg | ORAL_CAPSULE | Freq: Three times a day (TID) | ORAL | 3 refills | Status: DC | PRN

## 2018-10-28 MED FILL — GABAPENTIN 100 MG CAPSULE: 100 | 10 days supply | Qty: 90 | Fill #0

## 2018-10-28 NOTE — Patient Instructions (Addendum)
   See printed home exercises  Will get set up with PT  OK to use Advil 200 mg on occasion  Will decrease dose Gabapentin pills, can take 100-300 mg as needed three times per day   See below for TENS unit info  If knee/back/arm is not better or if it gets worse, I would recommend follow-up with one of our sports medicine specialists (Dr Denyse Amass or Dr. Cherylann Parr Dr. Karie Schwalbe) for further evaluation in 2-4 weeks. Just call our office and ask for an appointment for sports medicine!            TENS UNIT: This is helpful for muscle pain and spasm.   Search and Purchase a TENS 7000 2nd edition at  www.tenspros.com or www.Amazon.com It should be less than $30.     TENS unit instructions: Do not shower or bathe with the unit on . Turn the unit off before removing electrodes or batteries . If the electrodes lose stickiness add a drop of water to the electrodes after they are disconnected from the unit and place on plastic sheet. If you continued to have difficulty, call the TENS unit company to purchase more electrodes. . Do not apply lotion on the skin area prior to use. Make sure the skin is clean and dry as this will help prolong the life of the electrodes. . After use, always check skin for unusual red areas, rash or other skin difficulties. If there are any skin problems, does not apply electrodes to the same area. . Never remove the electrodes from the unit by pulling the wires. . Do not use the TENS unit or electrodes other than as directed. . Do not change electrode placement without consultating your therapist or physician. Marland Kitchen Keep 2 fingers with between each electrode. . Wear time ratio is 2:1, on to off times.    For example on for 30 minutes off for 15 minutes and then on for 30 minutes off for 15 minutes

## 2018-10-28 NOTE — Progress Notes (Signed)
HPI: Michaela Holland is a 38 y.o. female who  has a past medical history of Anxiety, Back pain, Chronic pain of left knee, Depression, Diabetes mellitus without complication (HCC), Family history of adverse reaction to anesthesia, Gallbladder problem, GERD (gastroesophageal reflux disease), Gout, Hypertension, Hypothyroidism, Joint pain, Obesity, Osteoarthritis, Palpitations, PCOS (polycystic ovarian syndrome), PONV (postoperative nausea and vomiting), Seasonal allergies, Seizures (HCC), Sleep apnea, and Vitamin D deficiency.  she presents to [facility] today, 10/30/18,  for chief complaint of:  Knee and low back pain  Left knee pain, does not really seem to be in the joint, more above the joint/quadriceps area.  Seems different from previous pain with patella issues.  Reports soreness in this area with increased walking.  Ports lower back pain, no radiation down into the legs.  Gabapentin is helping a bit but does make her tired.  Stretches are helping some.  Status post bariatric surgery, was advised is okay to take 200 mg of Advil on occasion, which she is a bit nervous about doing but it does help when she takes it.    BP Readings from Last 3 Encounters:  10/28/18 124/61  08/05/18 138/88  08/04/18 139/72   Wt Readings from Last 3 Encounters:  10/28/18 264 lb 12.8 oz (120.1 kg)  09/18/18 278 lb 3.2 oz (126.2 kg)  08/06/18 (!) 300 lb 3.2 oz (136.2 kg)         At today's visit 10/30/18 ... PMH, PSH, FH reviewed and updated as needed.  Current medication list and allergy/intolerance hx reviewed and updated as needed. (See remainder of HPI, ROS, Phys Exam below)         ASSESSMENT/PLAN: The primary encounter diagnosis was Acute bilateral low back pain without sciatica. A diagnosis of Quadriceps strain, left, initial encounter was also pertinent to this visit.   Orders Placed This Encounter  Procedures  . Ambulatory referral to Physical  Therapy     Meds ordered this encounter  Medications  . gabapentin (NEURONTIN) 100 MG capsule    Sig: Take 1-3 capsules (100-300 mg total) by mouth 3 (three) times daily as needed.    Dispense:  90 capsule    Refill:  3    Patient Instructions   See printed home exercises  Will get set up with PT  OK to use Advil 200 mg on occasion  Will decrease dose Gabapentin pills, can take 100-300 mg as needed three times per day   See below for TENS unit info  If knee/back/arm is not better or if it gets worse, I would recommend follow-up with one of our sports medicine specialists (Dr Denyse Amass or Dr. Cherylann Parr Dr. Karie Schwalbe) for further evaluation in 2-4 weeks. Just call our office and ask for an appointment for sports medicine!            TENS UNIT: This is helpful for muscle pain and spasm.   Search and Purchase a TENS 7000 2nd edition at  www.tenspros.com or www.Amazon.com It should be less than $30.     TENS unit instructions: Do not shower or bathe with the unit on . Turn the unit off before removing electrodes or batteries . If the electrodes lose stickiness add a drop of water to the electrodes after they are disconnected from the unit and place on plastic sheet. If you continued to have difficulty, call the TENS unit company to purchase more electrodes. . Do not apply lotion on the skin area prior to  use. Make sure the skin is clean and dry as this will help prolong the life of the electrodes. . After use, always check skin for unusual red areas, rash or other skin difficulties. If there are any skin problems, does not apply electrodes to the same area. . Never remove the electrodes from the unit by pulling the wires. . Do not use the TENS unit or electrodes other than as directed. . Do not change electrode placement without consultating your therapist or physician. Marland Kitchen Keep 2 fingers with between each electrode. . Wear time ratio is 2:1, on to off times.    For example  on for 30 minutes off for 15 minutes and then on for 30 minutes off for 15 minutes           Follow-up plan: Return in about 3 months (around 01/26/2019) for ANNUAL PHYSICAL - sooner if needed! .                                                 ################################################# ################################################# ################################################# #################################################    Current Meds  Medication Sig  . acetaminophen-codeine (TYLENOL #3) 300-30 MG tablet Take 1-2 tablets by mouth every 8 (eight) hours as needed for moderate pain. (Patient taking differently: Take 1 tablet by mouth every 8 (eight) hours as needed for moderate pain. )  . albuterol (PROVENTIL HFA;VENTOLIN HFA) 108 (90 Base) MCG/ACT inhaler Inhale 1-2 puffs into the lungs every 4 (four) hours as needed for wheezing or shortness of breath (bronchospasm).  Marland Kitchen azithromycin (ZITHROMAX) 250 MG tablet 2 tabs po on Day 1, then 1 tab daily Days 2 - 5  . calcium-vitamin D (OSCAL WITH D) 500-200 MG-UNIT tablet Take 1 tablet by mouth 3 (three) times daily.  . citalopram (CELEXA) 40 MG tablet Take 1 tablet (40 mg total) by mouth daily.  . clonazePAM (KLONOPIN) 0.5 MG tablet Take 0.5-1 tablets (0.25-0.5 mg total) by mouth 2 (two) times daily as needed for anxiety (#30 for 90 days.).  Marland Kitchen EPINEPHrine 0.3 mg/0.3 mL IJ SOAJ injection Inject 0.3 mg into the muscle daily as needed (for anaphylactic reactions.).   Marland Kitchen levothyroxine (SYNTHROID, LEVOTHROID) 175 MCG tablet Take 1 tablet (175 mcg total) by mouth daily before breakfast.  . levothyroxine (SYNTHROID, LEVOTHROID) 175 MCG tablet TAKE 1 TABLET BY MOUTH DAILY BEFORE BREAKFAST.  Marland Kitchen levothyroxine (SYNTHROID, LEVOTHROID) 175 MCG tablet Take 1 tablet (175 mcg total) by mouth daily before breakfast.  . meclizine (ANTIVERT) 25 MG tablet Take 1 tablet (25 mg total) by mouth 3  (three) times daily as needed for dizziness.  . methylphenidate (RITALIN) 10 MG tablet Take 15 mg by mouth 2 (two) times daily.  . Multiple Vitamins-Minerals (ADULT GUMMY PO) Take 2 tablets by mouth at bedtime.  . Multiple Vitamins-Minerals (BARIATRIC FUSION) CHEW Chew 1 tablet by mouth 2 (two) times daily.  . norelgestromin-ethinyl estradiol (ORTHO EVRA) 150-35 MCG/24HR transdermal patch Place 1 patch onto the skin once a week.  . ondansetron (ZOFRAN-ODT) 4 MG disintegrating tablet Take 4 mg by mouth 3 (three) times daily as needed for nausea/vomiting.  Marland Kitchen oxyCODONE (ROXICODONE) 5 MG/5ML solution Take 5-10 mLs by mouth every 6 (six) hours as needed for pain.  . pantoprazole (PROTONIX) 40 MG tablet Take 40 mg by mouth daily.  . predniSONE (DELTASONE) 20 MG tablet Take 1 tablet (20 mg total) by  mouth 2 (two) times daily with a meal.  . Turmeric 500 MG CAPS Take 1,000 mg by mouth at bedtime.  . vitamin B-12 (CYANOCOBALAMIN) 500 MCG tablet Take 500 mcg by mouth daily.  Marland Kitchen. VITAMIN D PO Take by mouth.  . [DISCONTINUED] gabapentin (NEURONTIN) 300 MG capsule TAKE 1 CAPSULE BY MOUTH AT BEDTIME FOR 7 DAYS, 1 CAPSULE TWICE DAILY FOR 7 DAYS, THEN 1 CAPSULE 3 TIMES A DAY. MAY DOUBLE WEEKLY TO 3600MG  M    Allergies  Allergen Reactions  . Sulfa Antibiotics Hives and Shortness Of Breath  . Other     Nuts severe reaction difficulty breathing , throat swelling; mostly tree nuts but not all tree nuts   . Adhesive [Tape] Rash       Review of Systems:  Constitutional: No recent illness  Cardiac: No  chest pain, No  pressure, No palpitations  Respiratory:  No  shortness of breath.   Gastrointestinal: No  abdominal pain  Musculoskeletal: +new myalgia/arthralgia  Skin: No  Rash  Neurologic: No  weakness, No  Dizziness   Exam:  BP 124/61 (BP Location: Left Arm, Patient Position: Sitting, Cuff Size: Normal)   Pulse 72   Temp 97.8 F (36.6 C) (Oral)   Wt 264 lb 12.8 oz (120.1 kg)   BMI 46.17  kg/m   Constitutional: VS see above. General Appearance: alert, well-developed, well-nourished, NAD  Eyes: Normal lids and conjunctive, non-icteric sclera  Ears, Nose, Mouth, Throat: MMM, Normal external inspection ears/nares/mouth/lips/gums.  Neck: No masses, trachea midline.   Respiratory: Normal respiratory effort. no wheeze, no rhonchi, no rales  Cardiovascular: S1/S2 normal, no murmur, no rub/gallop auscultated. RRR.   Musculoskeletal: Gait normal. Symmetric and independent movement of all extremities  L knee no crepitus, normal Ant/Post drawer, (+)tenderness suprapatellar region in quadriceps, patella motion normal   No midline lower back tenderness, (+)paraspinal lower lumbar tenderness   Neurological: Normal balance/coordination. No tremor.  Skin: warm, dry, intact.   Psychiatric: Normal judgment/insight. Normal mood and affect. Oriented x3.       Visit summary with medication list and pertinent instructions was printed for patient to review, patient was advised to alert us if any updates are needed. All questions at time of visit were answered - patient instructed to contact office with any additional concerns. ER/RTC precautions were reviewed with the patient and understanding verbalized.      Please note: voice recognition software was used to produce this document, and typos may escape review. Please contact Dr. Lyn HollingsheadAlexander for any needed clarifications.    Follow up plan: Return in about 3 months (around 01/26/2019) for ANNUAL PHYSICAL - sooner if needed! .Marland Kitchen

## 2018-10-30 ENCOUNTER — Ambulatory Visit: Payer: Self-pay | Admitting: Osteopathic Medicine

## 2018-10-30 ENCOUNTER — Encounter: Payer: Self-pay | Admitting: Osteopathic Medicine

## 2018-10-30 ENCOUNTER — Ambulatory Visit: Payer: Self-pay | Admitting: Skilled Nursing Facility1

## 2018-11-05 ENCOUNTER — Ambulatory Visit: Payer: Self-pay | Admitting: Skilled Nursing Facility1

## 2018-11-06 ENCOUNTER — Encounter: Payer: Self-pay | Admitting: Dietician

## 2018-11-06 ENCOUNTER — Encounter: Payer: BLUE CROSS/BLUE SHIELD | Attending: Surgery | Admitting: Dietician

## 2018-11-06 VITALS — Wt 263.2 lb

## 2018-11-06 DIAGNOSIS — E669 Obesity, unspecified: Secondary | ICD-10-CM | POA: Insufficient documentation

## 2018-11-06 DIAGNOSIS — Z713 Dietary counseling and surveillance: Secondary | ICD-10-CM | POA: Insufficient documentation

## 2018-11-06 NOTE — Progress Notes (Signed)
Bariatric Follow-Up Visit Medical Nutrition Therapy  Appt Start Time: 11:45am End Time: 12:30pm  3 Months Post-Operative Sleeve Gastrectomy Surgery Surgery Date: 07/22/2018   NUTRITION ASSESSMENT  Anthropometrics  Start weight at [facility]: 322.5lbs (04/30/2018) Today's weight: 263.2 lbs Total weight lost: -59.3 lbs  TANITA  Body Composition Results  08/05/2018 09/18/2018 11/06/2018   BMI (kg/m2) 52.3 48.5 45.7   Fat Mass (lbs) 165.2 148.8 120.6   Fat Free Mass (lbs) 135 129.4 140.5   Total Body Water (lbs) 101.2 96.4 108.1   Total Body Water (%) - - 41.4   Pt stated weight goal: next target is below 250 lbs, with the eventual goal of 170 lbs. However, pt states she would be satisfied with a weight higher than this, after learning body composition and how muscle/fluid play a role in overall weight and health. Pt switched her primary goal to then focus on body fat, and to have less than 40% total body fat.   Clinical Medical Hx: obesity, vitamin D deficiency, sleep apnea, PCOS, osteoarthritis, hypothyroidism, HTN, gout, GERD, depression, anxiety Medications: see list  24-Hr Dietary Recall First Meal: coffee + protein shake Snack: 1/4 cup roasted almonds  Second Meal: sweet peppers + red pepper hummus + gouda cheese + deli ham  Snack: 2 sausage links (or celery stalks + peanut butter + craisins)  Third Meal: burger patty + cheddar cheese + pork + bacon + lettuce wrap + pickle + ranch Snack: none Beverages: coffee, decaf tea, water, vitamin zero water   Food & Nutrition Related Hx Dietary Hx: Pt states she has cravings for sweet things, whereas before surgery she usually craved salty/savory, especially fast food. [name]. [name], such as bringing foods appropriate for her to eat to parties and recognizing cravings and having good "go-to" foods in those situations. Pt states she is interested in trying bean/vegetable based  pastas in lieu of regular pasta. Pt states she has not consumed carbonation since surgery. [name] on most days (an improvement since most recent appointment 6 weeks ago.)  Supplements: bariatric MVI, calcium (RD recommended switching to 1 a day bariatric MVI, since pt is struggling to take all supplements and medications daily)  Estimated Daily Fluid Intake: ~64 to 85 oz Estimated Daily Protein Intake: 60 g  Physical Activity  Current average weekly physical activity: limited dt knee injury, but moving more by playing with dog and daughter; also, parking farther away from the store, taking stairs, walking more through the store when shopping, cleaning the house (focusing on exercising more by living)    NUTRITION DIAGNOSIS  Overweight/obesity ([place]-3.3) related to past poor dietary habits and physical inactivity as evidenced by [name] following dietary guidelines for continued weight loss.   NUTRITION INTERVENTION Nutrition counseling (C-1) and education (E-2) to facilitate bariatric surgery goals, including: . The importance of consuming adequate calories as well as certain nutrients daily due to the body's need for essential vitamins, minerals, and fats . The importance of daily physical activity and to reach a goal of at least 150 minutes of moderate to vigorous physical activity weekly (or as directed by their physician) due to benefits such as increased musculature and improved lab values  Handouts Provided Include   Should I Eat?  Bariatric Snack Ideas  Learning Style & Readiness for Change Teaching method utilized: Visual & Auditory  Demonstrated degree of understanding via: Teach Back  Barriers to learning/adherence to lifestyle change: Stress    MONITORING & EVALUATION Dietary intake, weekly physical activity, body weight, and goals in 3 months.  Next Steps Patient is to follow-up in 3  months for 6 month post-op follow-up.

## 2018-11-06 NOTE — Patient Instructions (Signed)
   Keep up the great work! Remember your goals and why you want to reach them, and you will.   Continue to prioritize protein foods and non-starchy vegetables.   Good job on managing your cravings. Have a list of good go-to foods that you can snack on when you do feel hungry, and use the "Should I Eat?" sheet to help identify cravings.   Continue to get at least 64 ounces of fluid each day.   See you in 3 months at the 6 Month Class!

## 2018-11-26 DIAGNOSIS — F900 Attention-deficit hyperactivity disorder, predominantly inattentive type: Secondary | ICD-10-CM | POA: Diagnosis not present

## 2018-11-26 MED FILL — clonazePAM 0.5 MG TABS: 0.5 | 70 days supply | Qty: 30 | Fill #0 | Status: TO

## 2018-11-26 MED FILL — CITALOPRAM HBR 40 MG TABLET: 40 | 90 days supply | Qty: 90 | Fill #0 | Status: TO

## 2018-11-27 ENCOUNTER — Other Ambulatory Visit: Payer: Self-pay | Admitting: Family Medicine

## 2018-11-27 ENCOUNTER — Other Ambulatory Visit: Payer: Self-pay | Admitting: Osteopathic Medicine

## 2018-11-27 ENCOUNTER — Other Ambulatory Visit: Payer: Self-pay | Admitting: Endocrinology

## 2018-11-27 MED FILL — METHYLPHENIDATE 10 MG TAB: 10 | 90 days supply | Qty: 270 | Fill #0

## 2018-11-27 MED FILL — GABAPENTIN 100 MG CAPSULE: 100 | 10 days supply | Qty: 90 | Fill #1

## 2018-11-27 MED FILL — GABAPENTIN 300 MG CAPSULE: 300 | 15 days supply | Qty: 180 | Fill #0 | Status: TO

## 2018-11-28 ENCOUNTER — Telehealth (INDEPENDENT_AMBULATORY_CARE_PROVIDER_SITE_OTHER): Payer: Self-pay | Admitting: Orthopaedic Surgery

## 2018-11-28 ENCOUNTER — Encounter: Payer: Self-pay | Admitting: Osteopathic Medicine

## 2018-11-28 NOTE — Telephone Encounter (Signed)
09/11/2018 ov note faxed to [facility] [phone]

## 2018-12-01 MED ORDER — TRAZODONE HCL 50 MG PO TABS: 25.0000 mg | ORAL_TABLET | Freq: Every evening | ORAL | 3 refills | Status: DC | PRN

## 2018-12-01 MED ORDER — LEVOTHYROXINE SODIUM 175 MCG PO TABS: 175.0000 ug | ORAL_TABLET | Freq: Every day | ORAL | 1 refills | Status: DC

## 2018-12-01 MED FILL — traZODone HCL 50 MG TABS: 50 | 15 days supply | Qty: 30 | Fill #0 | Status: TO

## 2018-12-01 MED FILL — LEVOTHYROXINE 175 MCG TAB: 175 | 90 days supply | Qty: 90 | Fill #0 | Status: TO

## 2018-12-01 MED FILL — PANTOPRAZOLE SOD DR 40 MG T: 40 | 90 days supply | Qty: 90 | Fill #0 | Status: TO

## 2018-12-04 MED ORDER — MOMETASONE FUROATE 50 MCG/ACT NA SUSP: NASAL | 6 refills | Status: DC

## 2018-12-04 NOTE — Addendum Note (Signed)
Addended by: Deirdre Pippins on: 12/04/2018 11:52 AM   Modules accepted: Orders

## 2019-01-08 ENCOUNTER — Telehealth: Payer: Self-pay | Admitting: Neurology

## 2019-01-08 NOTE — Telephone Encounter (Signed)
I called patient to offer a sooner appointment via virtual visit. Patient stated that she did not feel that a follow-up appointment would be beneficial as she has stopped using her cpap. She states that she was having mild sleep apnea and since then she has lost 90 pounds from bariatric surgery. Patient requested cancellation of appointment and I advised her to call back if needed.

## 2019-01-08 NOTE — Telephone Encounter (Signed)
Noted, thank you

## 2019-01-20 ENCOUNTER — Encounter: Payer: Self-pay | Admitting: Dietician

## 2019-01-20 ENCOUNTER — Encounter: Payer: BLUE CROSS/BLUE SHIELD | Attending: Surgery | Admitting: Dietician

## 2019-01-20 DIAGNOSIS — E669 Obesity, unspecified: Secondary | ICD-10-CM | POA: Insufficient documentation

## 2019-01-20 DIAGNOSIS — Z713 Dietary counseling and surveillance: Secondary | ICD-10-CM | POA: Insufficient documentation

## 2019-01-20 NOTE — Progress Notes (Signed)
Bariatric Follow-Up Visit Medical Nutrition Therapy  Appt Start Time: 11:45am End Time: 12:45pm  6 Months Post-Operative Sleeve Gastrectomy Surgery Surgery Date: 07/22/2018  Telephone Visit This visit was completed via telephone due to the COVID-19 pandemic.   I spoke with Michaela Holland and verified that I was speaking with the correct person with two patient identifiers (full name and date of birth).   I discussed the limitations related to this kind of visit and the patient is willing to proceed.   NUTRITION ASSESSMENT  Anthropometrics  Start weight at [facility]: 322.5lbs (04/30/2018) Today's weight: ~248 lbs (pt reported)  Total weight lost: -74.5 lbs  TANITA  Body Composition Results  08/05/2018 09/18/2018 11/06/2018 01/20/2019   BMI (kg/m2) 52.3 48.5 45.7 N/A   Fat Mass (lbs) 165.2 148.8 120.6    Fat Free Mass (lbs) 135 129.4 140.5    Total Body Water (lbs) 101.2 96.4 108.1    Total Body Water (%) - - 41.4    Pt stated weight goal (on 11/06/2018): next target is below 250 lbs, with the eventual goal of 170 lbs. However, pt states she would be satisfied with a weight higher than this, after learning body composition and how muscle/fluid play a role in overall weight and health. Pt switched her primary goal to then focus on body fat, and to have less than 40% total body fat.    Clinical Medical Hx: obesity, vitamin D deficiency, sleep apnea, PCOS, osteoarthritis, hypothyroidism, HTN, gout, GERD, depression, anxiety Medications: see list  Psychological/Lifestyle  Pt is talkative. Pt lives with her husband and their 38 year old daughter. Pt's husband had bariatric surgery as well.   24-Hr Dietary Recall First Meal: coffee + protein shake Snack: 1/4 cup roasted almonds  Second Meal: sweet peppers + red pepper hummus + gouda cheese + deli ham  Snack: 2 sausage links (or celery stalks + peanut butter + craisins)  Third Meal: spaghetti squash + tomato sauce + meat  Snack: none Beverages:  coffee w/ protein shake, decaf tea, water, vitamin zero water, diet Snapple, milk  Food & Nutrition Related Hx Dietary Hx: Pt states she has cravings for sweet things, whereas before surgery she usually craved salty/savory, especially fast food. [name]. [name], such as bringing foods appropriate for her to eat to parties and recognizing cravings and having good "go-to" foods in those situations. Pt states she is interested in trying bean/vegetable based pastas in lieu of regular pasta. Pt states she has not consumed carbonation since surgery. [name] on most days (an improvement since most recent appointment 6 weeks ago.)   01/20/2019: Anxiety has worsened, been snacking more. Stress eater. Checks in with self daily, and talks herself through her food choices. Trying to snack on "allowed" foods such as almonds, peanut butter, diet Snapple. Still enjoys carb foods and will eat them. For example, working on eating a few bites of fruit instead of M&M's or animal crackers. States she feels like in "cruise mode." Knows what supposed to do and tries to stick to it despite cravings. Learning which foods do and do not agree with body. Finding satisfaction in foods and feels as though she needs to try a food she craves at least once to validate that she does not need or care for it that much.   Supplements: bariatric MVI, calcium  Estimated Daily Fluid Intake: ~  64 to 85 oz Estimated Daily Protein Intake: 60+ g  Physical Activity  Current average weekly physical activity: limited dt knee injury, but moving more by playing with dog and daughter; also, parking farther away from the store, taking stairs, walking more through the store when shopping, cleaning the house (focusing on exercising more by living)    New Goal 01/20/2019: Get outside everyday. Minimum daily step count (4,000+). Do as much as  possible without exacerbating knee pain.     NUTRITION DIAGNOSIS  Overweight/obesity ([place]-3.3) related to past poor dietary habits and physical inactivity as evidenced by [name] following dietary guidelines for continued weight loss.   NUTRITION INTERVENTION Nutrition counseling (C-1) and education (E-2) to facilitate bariatric surgery goals, including: . Diet advancement to the next phase (phase V) now including starchy vegetables . The importance of consuming adequate calories as well as certain nutrients daily due to the body's need for essential vitamins, minerals, and fats . The importance of daily physical activity and to reach a goal of at least 150 minutes of moderate to vigorous physical activity weekly (or as directed by their physician) due to benefits such as increased musculature and improved lab values  Handouts Provided Include   Phase V: Protein + All Vegetables   Bariatric Serving Sizes   After visit summary and handouts were provided to pt via email ([email or web address]) per pt request as well as via MyChart.   Learning Style & Readiness for Change Teaching method utilized: Auditory  Demonstrated degree of understanding via: Teach Back  Barriers to learning/adherence to lifestyle change: Stress    MONITORING & EVALUATION Dietary intake, weekly physical activity, body weight, and goals in 3 months.  Next Steps Patient is to follow-up in 3 months for 9 month post-op follow-up.

## 2019-01-20 NOTE — Patient Instructions (Signed)
.   Continue to aim for a minimum of 64 fluid ounces 7 days a week with at least 30 ounces being plain water. . Eat non-starchy vegetables at least 2 times a day 7 days a week. Begin to incorporate starchy vegetables as desired (after making sure you are eating plenty of non-starchy vegetables and still reaching protein goal!) . Per meal/snack, eat 3 ounces of protein first then start on vegetables. o Once you understand how much of your meal leads to satisfaction (and not full) while still eating 3 ounces of protein and non-starchy vegetables, you can eat them in any order (figure out how much you can eat at a time to get enough but not too much.)  o Attached is a measurements & serving sizes sheet to help navigate this as well! . Continue to aim for 30 minutes of physical activity at least 5 times a week.   Keep up the GREAT work! You have made a tremendous amount of progress on your journey so far. We will see you in about 3 months for your 9 month follow up visit. Please contact us in the meantime with questions or concerns.

## 2019-01-22 ENCOUNTER — Encounter: Payer: Self-pay | Admitting: Osteopathic Medicine

## 2019-01-26 ENCOUNTER — Encounter: Payer: Self-pay | Admitting: Osteopathic Medicine

## 2019-02-10 ENCOUNTER — Encounter: Payer: BLUE CROSS/BLUE SHIELD | Admitting: Osteopathic Medicine

## 2019-02-11 ENCOUNTER — Ambulatory Visit (INDEPENDENT_AMBULATORY_CARE_PROVIDER_SITE_OTHER): Payer: BLUE CROSS/BLUE SHIELD | Admitting: Osteopathic Medicine

## 2019-02-11 ENCOUNTER — Encounter: Payer: Self-pay | Admitting: Osteopathic Medicine

## 2019-02-11 ENCOUNTER — Other Ambulatory Visit: Payer: Self-pay

## 2019-02-11 VITALS — BP 129/73 | HR 75 | Wt 242.0 lb

## 2019-02-11 DIAGNOSIS — Z0000 Encounter for general adult medical examination without abnormal findings: Secondary | ICD-10-CM

## 2019-02-11 DIAGNOSIS — B369 Superficial mycosis, unspecified: Secondary | ICD-10-CM | POA: Diagnosis not present

## 2019-02-11 DIAGNOSIS — E038 Other specified hypothyroidism: Secondary | ICD-10-CM

## 2019-02-11 DIAGNOSIS — F520 Hypoactive sexual desire disorder: Secondary | ICD-10-CM

## 2019-02-11 MED ORDER — NYSTATIN 100000 UNIT/GM EX POWD: Freq: Four times a day (QID) | CUTANEOUS | 1 refills | Status: DC

## 2019-02-11 NOTE — Patient Instructions (Signed)
General Preventive Care  Most recent routine screening lipids/other labs:   Everyone should have blood pressure checked once per year.   Tobacco: don't!   Alcohol: responsible moderation is ok for most adults - if you have concerns about your alcohol intake, please talk to me!   Exercise: as tolerated to reduce risk of cardiovascular disease and diabetes. Strength training will also prevent osteoporosis.   Mental health: if need for mental health care (medicines, counseling, other), or concerns about moods, please let me know!   Sexual health: if need for STD testing, or if concerns with libido/pain problems, please let me know! If you need to discuss your birth control options, please let me know!   Advanced Directive: Living Will and/or Healthcare Power of Attorney recommended for all adults, regardless of age or health.  Vaccines  Flu vaccine: recommended for almost everyone, every fall.   Shingles vaccine: Shingrix recommended after age 61.   Pneumonia vaccines: Prevnar and Pneumovax recommended after age 28  Tetanus booster: Tdap recommended every 10 years. Due 2026 Cancer screenings   Colon cancer screening: recommended for everyone at age 56, but some folks need a colonoscopy sooner if risk factors   Breast cancer screening: mammogram recommended at age 11 every other year at least, and annually after age 46.   Cervical cancer screening: Pap every 1 to 5 years depending on age and other risk factors. Can usually stop at age 31 or w/ hysterectomy.   Lung cancer screening: not needed for non-smokers  Infection screenings . HIV, Gonorrhea/Chlamydia: screening as needed . Hepatitis C: recommended for anyone born 55-1965 . TB: certain at-risk populations, or depending on work requirements and/or travel history Other  Bone Density Test: recommended for women at age 73

## 2019-02-11 NOTE — Progress Notes (Signed)
HPI: Michaela Holland is a 38 y.o. female who  has a past medical history of Anxiety, Back pain, Chronic pain of left knee, Depression, Diabetes mellitus without complication (HCC), Family history of adverse reaction to anesthesia, Gallbladder problem, GERD (gastroesophageal reflux disease), Gout, Hypertension, Hypothyroidism, Joint pain, Obesity, Osteoarthritis, Palpitations, PCOS (polycystic ovarian syndrome), PONV (postoperative nausea and vomiting), Seasonal allergies, Seizures (HCC), Sleep apnea, and Vitamin D deficiency.  she presents to [facility] today, 02/11/19,  for chief complaint of: Annual physical     Patient here for annual physical / wellness exam.  See preventive care reviewed as below.   Additional concerns today include:   Levothyroxine, would like to recheck levels  Gabapentin concern, taking 100 mg during the day, 200 mg qhs.  Was taking 300-600 during the day, the higher dose in the evening caused some morning grogginess   Low libido   Skin rash - fungal in skin folds  Wt Readings from Last 3 Encounters:  02/11/19 242 lb (109.8 kg)  11/06/18 263 lb 3.2 oz (119.4 kg)  10/28/18 264 lb 12.8 oz (120.1 kg)     Past medical, surgical, social and family history reviewed:  Patient Active Problem List   Diagnosis Date Noted  . Chronic pain of right knee 09/11/2018  . Unilateral primary osteoarthritis, right knee 09/11/2018  . S/P laparoscopic sleeve gastrectomyNov2019 07/22/2018  . Closed nondisplaced fracture of proximal phalanx of lesser toe of left foot 06/04/2018  . Overactive bladder 03/28/2018  . Urinary incontinence, nocturnal enuresis 03/28/2018  . Unilateral primary osteoarthritis, left knee 02/24/2018  . H/O arthroscopy of left knee 02/24/2018  . Baker cyst, left 01/01/2018  . Other fatigue 10/22/2017  . Shortness of breath on exertion 10/22/2017  . Vitamin D deficiency 10/22/2017  . S/P arthroscopy of left knee  07/18/2017  . Acute pain of left knee 06/17/2017  . Meniscal cyst, left 06/17/2017  . Cyst of lateral meniscus of left knee 05/08/2017  . Anterior cruciate ligament sprain 05/08/2017  . Obesity 05/08/2017  . History of obstructive sleep apnea 04/09/2017  . Hypothyroidism 04/09/2017  . PCOS (polycystic ovarian syndrome) 04/09/2017  . Anxiety 04/09/2017  . Chronic pain of left knee 04/09/2017  . Gastroesophageal reflux disease with esophagitis 04/09/2017    Past Surgical History:  Procedure Laterality Date  . CESAREAN SECTION    . CHOLECYSTECTOMY    . ESOPHAGOGASTRODUODENOSCOPY  2001 and 2018  . GANGLION CYST EXCISION Left 2014   foot  . KNEE ARTHROSCOPY Left 06/28/2017   Procedure: LEFT KNEE ARTHROSCOPY with debridement;  Surgeon: Kathryne Hitch, MD;  Location: [facility];  Service: Orthopedics;  Laterality: Left;  . LAPAROSCOPIC GASTRIC SLEEVE RESECTION N/A 07/22/2018   Procedure: LAPAROSCOPIC GASTRIC SLEEVE RESECTION WITH UPPER ENDO AND ERAS PATHWAY;  Surgeon: Luretha Murphy, MD;  Location: [facility];  Service: General;  Laterality: N/A;    Social History   Tobacco Use  . Smoking status: Never Smoker  . Smokeless tobacco: Never Used  Substance Use Topics  . Alcohol use: Never    Frequency: Never    Family History  Problem Relation Age of Onset  . Polycystic ovary syndrome Mother   . Hypothyroidism Mother   . Hyperlipidemia Mother   . Depression Mother   . Obesity Mother   . Polycystic ovary syndrome Sister   . Hypothyroidism Sister   . Hypertension Father   . Hyperlipidemia Father   . Cancer Father   . Depression Father   .  Anxiety disorder Father   . Obesity Father   . Asthma Other      Current medication list and allergy/intolerance information reviewed:    Current Outpatient Medications  Medication Sig Dispense Refill  . albuterol (PROVENTIL HFA;VENTOLIN HFA) 108 (90 Base) MCG/ACT inhaler Inhale 1-2 puffs into the lungs every 4 (four) hours as needed  for wheezing or shortness of breath (bronchospasm). 1 Inhaler 0  . AMBULATORY NON FORMULARY MEDICATION Take 1 each by mouth at bedtime. Medication Name: Melatonin Chew 6 mg    . calcium-vitamin D (OSCAL WITH D) 500-200 MG-UNIT tablet Take 1 tablet by mouth 3 (three) times daily.    . citalopram (CELEXA) 40 MG tablet Take 1 tablet (40 mg total) by mouth daily. 90 tablet 0  . clonazePAM (KLONOPIN) 0.5 MG tablet Take 0.5-1 tablets (0.25-0.5 mg total) by mouth 2 (two) times daily as needed for anxiety (#30 for 90 days.). 30 tablet 0  . EPINEPHrine 0.3 mg/0.3 mL IJ SOAJ injection Inject 0.3 mg into the muscle daily as needed (for anaphylactic reactions.).     Marland Kitchen gabapentin (NEURONTIN) 100 MG capsule Take 1-3 capsules (100-300 mg total) by mouth 3 (three) times daily as needed. 90 capsule 3  . ibuprofen (ADVIL) 200 MG tablet Take 200 mg by mouth as needed.    Marland Kitchen levothyroxine (SYNTHROID, LEVOTHROID) 175 MCG tablet Take 1 tablet (175 mcg total) by mouth daily before breakfast. 90 tablet 1  . metFORMIN (GLUCOPHAGE-XR) 500 MG 24 hr tablet Take 2 tablets (1,000 mg total) by mouth at bedtime. 180 tablet 3  . methylphenidate (RITALIN) 10 MG tablet Take 15 mg by mouth 2 (two) times daily.  0  . mometasone (NASONEX) 50 MCG/ACT nasal spray One spray in each nostril twice a day, use left hand for right nostril, and right hand for left nostril.  Please dispense 3 bottles. Refill x3. 1 g 6  . Multiple Vitamins-Minerals (BARIATRIC FUSION) CHEW Chew 1 tablet by mouth 2 (two) times daily.    . norelgestromin-ethinyl estradiol (ORTHO EVRA) 150-35 MCG/24HR transdermal patch Place 1 patch onto the skin once a week. 9 patch 4  . ondansetron (ZOFRAN-ODT) 4 MG disintegrating tablet Take 4 mg by mouth 3 (three) times daily as needed for nausea/vomiting.  0  . pantoprazole (PROTONIX) 40 MG tablet Take 40 mg by mouth daily.  2  . traZODone (DESYREL) 50 MG tablet Take 0.5-2 tablets (25-100 mg total) by mouth at bedtime as needed for  sleep. 30 tablet 3  . Turmeric 500 MG CAPS Take 1,000 mg by mouth at bedtime.    . vitamin B-12 (CYANOCOBALAMIN) 500 MCG tablet Take 500 mcg by mouth daily.    Marland Kitchen nystatin (MYCOSTATIN/NYSTOP) powder Apply topically 4 (four) times daily. As needed for fungal skin infection 45 g 1   No current facility-administered medications for this visit.     Allergies  Allergen Reactions  . Sulfa Antibiotics Hives and Shortness Of Breath  . Other     Nuts severe reaction difficulty breathing , throat swelling; mostly tree nuts but not all tree nuts   . Adhesive [Tape] Rash      Review of Systems:  Constitutional:  No  fever, no chills, No recent illness, No unintentional weight changes. No significant fatigue.   HEENT: No  headache, no vision change, no hearing change, No sore throat, No  sinus pressure  Cardiac: No  chest pain, No  pressure, No palpitations, No  Orthopnea  Respiratory:  No  shortness of  breath. No  Cough  Gastrointestinal: No  abdominal pain, No  nausea, No  vomiting,  No  blood in stool, No  diarrhea, No  constipation   Musculoskeletal: No new myalgia/arthralgia  Skin: No  Rash, No other wounds/concerning lesions  Genitourinary: No  incontinence, No  abnormal genital bleeding, No abnormal genital discharge  Hem/Onc: No  easy bruising/bleeding, No  abnormal lymph node  Endocrine: No cold intolerance,  No heat intolerance. No polyuria/polydipsia/polyphagia   Neurologic: No  weakness, No  dizziness, No  slurred speech/focal weakness/facial droop  Psychiatric: No  concerns with depression, No  concerns with anxiety, No sleep problems, No mood problems  Exam:  BP 129/73   Pulse 75   Wt 242 lb (109.8 kg)   SpO2 97%   BMI 42.20 kg/m   Constitutional: VS see above. General Appearance: alert, well-developed, well-nourished, NAD  Eyes: Normal lids and conjunctive, non-icteric sclera  Ears, Nose, Mouth, Throat: MMM, Normal external inspection  ears/nares/mouth/lips/gums. TM normal bilaterally. Pharynx/tonsils no erythema, no exudate. Nasal mucosa normal.   Neck: No masses, trachea midline. No thyroid enlargement. No tenderness/mass appreciated. No lymphadenopathy  Respiratory: Normal respiratory effort. no wheeze, no rhonchi, no rales  Cardiovascular: S1/S2 normal, no murmur, no rub/gallop auscultated. RRR. No lower extremity edema. Pedal pulse II/IV bilaterally DP and PT. No carotid bruit or JVD. No abdominal aortic bruit.  Gastrointestinal: Nontender, no masses. No hepatomegaly, no splenomegaly. No hernia appreciated. Bowel sounds normal. Rectal exam deferred.   Musculoskeletal: Gait normal. No clubbing/cyanosis of digits.   Neurological: Normal balance/coordination. No tremor. No cranial nerve deficit on limited exam. Motor and sensation intact and symmetric. Cerebellar reflexes intact.   Skin: warm, dry, intact. No rash/ulcer. No concerning nevi or subq nodules on limited exam.    Psychiatric: Normal judgment/insight. Normal mood and affect. Oriented x3.   BREAST: No rashes/skin changes, normal fibrous breast tissue, no masses or tenderness, normal nipple without discharge, normal axilla   No results found for this or any previous visit (from the past 72 hour(s)).  No results found.   ASSESSMENT/PLAN: The primary encounter diagnosis was Annual physical exam. Diagnoses of Other specified hypothyroidism, Fungal infection of skin, and Hypoactive sexual desire were also pertinent to this visit.   Has upcoming appointment with psychiatry, would consider changing Celexa given possible libido side effect.  We will of course get blood work and see if anything turns up here that might also give answers.  Patient states that it has been going on since prior to starting current birth control, she has not noticed it getting worse since then.  Orders Placed This Encounter  Procedures  . CBC  . COMPLETE METABOLIC PANEL WITH GFR  .  Lipid panel  . TSH    Meds ordered this encounter  Medications  . nystatin (MYCOSTATIN/NYSTOP) powder    Sig: Apply topically 4 (four) times daily. As needed for fungal skin infection    Dispense:  45 g    Refill:  1    Patient Instructions  General Preventive Care  Most recent routine screening lipids/other labs:   Everyone should have blood pressure checked once per year.   Tobacco: don't!   Alcohol: responsible moderation is ok for most adults - if you have concerns about your alcohol intake, please talk to me!   Exercise: as tolerated to reduce risk of cardiovascular disease and diabetes. Strength training will also prevent osteoporosis.   Mental health: if need for mental health care (medicines, counseling, other), or  concerns about moods, please let me know!   Sexual health: if need for STD testing, or if concerns with libido/pain problems, please let me know! If you need to discuss your birth control options, please let me know!   Advanced Directive: Living Will and/or Healthcare Power of Attorney recommended for all adults, regardless of age or health.  Vaccines  Flu vaccine: recommended for almost everyone, every fall.   Shingles vaccine: Shingrix recommended after age 44.   Pneumonia vaccines: Prevnar and Pneumovax recommended after age 42  Tetanus booster: Tdap recommended every 10 years. Due 2026 Cancer screenings   Colon cancer screening: recommended for everyone at age 38, but some folks need a colonoscopy sooner if risk factors   Breast cancer screening: mammogram recommended at age 40 every other year at least, and annually after age 28.   Cervical cancer screening: Pap every 1 to 5 years depending on age and other risk factors. Can usually stop at age 39 or w/ hysterectomy.   Lung cancer screening: not needed for non-smokers  Infection screenings . HIV, Gonorrhea/Chlamydia: screening as needed . Hepatitis C: recommended for anyone born  60-1965 . TB: certain at-risk populations, or depending on work requirements and/or travel history Other  Bone Density Test: recommended for women at age 24           Visit summary with medication list and pertinent instructions was printed for patient to review. All questions at time of visit were answered - patient instructed to contact office with any additional concerns or updates. ER/RTC precautions were reviewed with the patient.    Please note: voice recognition software was used to produce this document, and typos may escape review. Please contact Dr. Lyn Hollingshead for any needed clarifications.     Follow-up plan: Return in about 1 year (around 02/11/2020) for annual physical, sooner if needed! Marland Kitchen

## 2019-02-12 LAB — LIPID PANEL
Cholesterol: 187 mg/dL (ref <200)
HDL: 54 mg/dL (ref >=50)
LDL Cholesterol (Calc): 108 mg/dL (calc) — ABNORMAL HIGH
Non-HDL Cholesterol (Calc): 133 mg/dL (calc) — ABNORMAL HIGH (ref <130)
Total CHOL/HDL Ratio: 3.5 (calc) (ref <5.0)
Triglycerides: 140 mg/dL (ref <150)

## 2019-02-12 LAB — CBC
HCT: 41.6 % (ref 35.0–45.0)
Hemoglobin: 13.7 g/dL (ref 11.7–15.5)
MCH: 28.5 pg (ref 27.0–33.0)
MCHC: 32.9 g/dL (ref 32.0–36.0)
MCV: 86.5 fL (ref 80.0–100.0)
MPV: 10 fL (ref 7.5–12.5)
Platelets: 315 10*3/uL (ref 140–400)
RBC: 4.81 10*6/uL (ref 3.80–5.10)
RDW: 11.8 % (ref 11.0–15.0)
WBC: 7.8 10*3/uL (ref 3.8–10.8)

## 2019-02-12 LAB — COMPLETE METABOLIC PANEL WITHOUT GFR
AG Ratio: 1.9 (calc) (ref 1.0–2.5)
ALT: 8 U/L (ref 6–29)
AST: 12 U/L (ref 10–30)
Albumin: 4.4 g/dL (ref 3.6–5.1)
Alkaline phosphatase (APISO): 39 U/L (ref 31–125)
BUN: 14 mg/dL (ref 7–25)
CO2: 27 mmol/L (ref 20–32)
Calcium: 9.7 mg/dL (ref 8.6–10.2)
Chloride: 102 mmol/L (ref 98–110)
Creat: 0.68 mg/dL (ref 0.50–1.10)
GFR, Est African American: 130 mL/min/1.73m2
GFR, Est Non African American: 112 mL/min/1.73m2
Globulin: 2.3 g/dL (ref 1.9–3.7)
Glucose, Bld: 81 mg/dL (ref 65–99)
Potassium: 4.4 mmol/L (ref 3.5–5.3)
Sodium: 138 mmol/L (ref 135–146)
Total Bilirubin: 0.7 mg/dL (ref 0.2–1.2)
Total Protein: 6.7 g/dL (ref 6.1–8.1)

## 2019-02-12 LAB — TSH: TSH: 0.93 m[IU]/L

## 2019-02-17 ENCOUNTER — Encounter: Payer: Self-pay | Admitting: Osteopathic Medicine

## 2019-02-19 MED ORDER — COLCHICINE 0.6 MG PO TABS: ORAL_TABLET | ORAL | 1 refills | Status: DC

## 2019-03-03 DIAGNOSIS — F900 Attention-deficit hyperactivity disorder, predominantly inattentive type: Secondary | ICD-10-CM | POA: Diagnosis not present

## 2019-03-20 ENCOUNTER — Telehealth (INDEPENDENT_AMBULATORY_CARE_PROVIDER_SITE_OTHER): Payer: BLUE CROSS/BLUE SHIELD | Admitting: Family Medicine

## 2019-03-20 ENCOUNTER — Other Ambulatory Visit: Payer: Self-pay

## 2019-03-20 ENCOUNTER — Encounter: Payer: Self-pay | Admitting: Family Medicine

## 2019-03-20 ENCOUNTER — Encounter: Payer: Self-pay | Admitting: Osteopathic Medicine

## 2019-03-20 VITALS — HR 64 | Temp 97.4°F | Resp 15 | Wt 235.7 lb

## 2019-03-20 DIAGNOSIS — B373 Candidiasis of vulva and vagina: Secondary | ICD-10-CM

## 2019-03-20 DIAGNOSIS — B3731 Acute candidiasis of vulva and vagina: Secondary | ICD-10-CM

## 2019-03-20 MED ORDER — FLUCONAZOLE 150 MG PO TABS: 150.0000 mg | ORAL_TABLET | Freq: Once | ORAL | 0 refills | Status: AC

## 2019-03-20 NOTE — Progress Notes (Signed)
Virtual Visit via Video Note  I connected with Michaela Holland on 03/20/19 at  4:20 PM EDT by a video enabled telemedicine application and verified that I am speaking with the correct person using two identifiers.   I discussed the limitations of evaluation and management by telemedicine and the availability of in person appointments. The patient expressed understanding and agreed to proceed.  Pt was at home and I was in my office for the virtual visit.     Subjective:    CC: Possible yeast   HPI: Pt reports that this is day 3. She stated that she has itching,irritation, no d/c  She cannot use the OTC med due to sensitivity. No pelvic pain.  No ffever, chills. No pelvic pain or pressure. She is monogamous with her husband. Says this feel similar to previous yeast infection. More sexually active recently with her husband.  Last infection was 6 months ago.  She does not douche.   Past medical history, Surgical history, Family history not pertinant except as noted below, Social history, Allergies, and medications have been entered into the medical record, reviewed, and corrections made.   Review of Systems: No fevers, chills, night sweats, weight loss, chest pain, or shortness of breath.   Objective:    General: Speaking clearly in complete sentences without any shortness of breath.  Alert and oriented x3.  Normal judgment. No apparent acute distress.    Impression and Recommendations:    Vaginitis - mostly consistent with a yeast infection.  Will treat with oral Diflucan since she does not tolerate topicals well.  She says when she uses the lower strength cream they do not seem to work and when she uses the higher strength creams they just seem to burn and irritate.  If not better after the weekend please give [place] call back.      I discussed the assessment and treatment plan with the patient. The patient was provided an opportunity to ask questions and all were answered. The patient  agreed with the plan and demonstrated an understanding of the instructions.   The patient was advised to call back or seek an in-person evaluation if the symptoms worsen or if the condition fails to improve as anticipated.   Beatrice Lecher, MD

## 2019-03-20 NOTE — Progress Notes (Signed)
lvm advising pt that I was calling to obtain her VS and go over her medications prior to her visit. Maryruth Eve, Lahoma Crocker, CMA   Called pt again still no response.Elouise Munroe, CMA   Pt reports that this is day 3. She stated that she has itching,irritation, no d/c  She cannot use the OTC med due to sensitivity. Maryruth Eve, Lahoma Crocker, CMA

## 2019-03-31 ENCOUNTER — Ambulatory Visit: Payer: Self-pay | Admitting: Adult Health

## 2019-04-01 ENCOUNTER — Ambulatory Visit (INDEPENDENT_AMBULATORY_CARE_PROVIDER_SITE_OTHER): Payer: BLUE CROSS/BLUE SHIELD | Admitting: Osteopathic Medicine

## 2019-04-01 ENCOUNTER — Encounter: Payer: Self-pay | Admitting: Osteopathic Medicine

## 2019-04-01 ENCOUNTER — Other Ambulatory Visit: Payer: Self-pay

## 2019-04-01 VITALS — BP 113/61 | HR 77 | Temp 97.1°F | Wt 234.7 lb

## 2019-04-01 DIAGNOSIS — Z9884 Bariatric surgery status: Secondary | ICD-10-CM | POA: Diagnosis not present

## 2019-04-01 DIAGNOSIS — R454 Irritability and anger: Secondary | ICD-10-CM

## 2019-04-01 DIAGNOSIS — F411 Generalized anxiety disorder: Secondary | ICD-10-CM

## 2019-04-01 NOTE — Progress Notes (Signed)
Virtual Visit via Video (App used: Doximity) Note  I connected with      Michaela Holland on 04/01/19 at 2:18 PM by a telemedicine application and verified that I am speaking with the correct person using two identifiers.  Patient is at home I am in office   I discussed the limitations of evaluation and management by telemedicine and the availability of in person appointments. The patient expressed understanding and agreed to proceed.  History of Present Illness: Michaela Holland is a 38 y.o. female who would like to discuss mood problems    Moodiness worse past few months Definitely worse when first started birth control, then got better but did not resolve.  Feels tired all the time, this is persistent prior to starting [facility].  Thought might be due to bariatric surgery.  Working split schedule several years, going to bed late, waking up early, napping during the day. Feels her sleep is restful, despite erratic schedule.  Increased irritability, frequently fighting with spouse (feels safe). He ad agreed to arrange for counseling but hasn't doe this yet, which is frustrating to her.  On Celexa about a year and it doesn't seem to be helping. Previously on Prozac, caused GI upset and headaches.  Gene Anderson seen before for therapist.      Depression screen [facility] 2/9 04/01/2019 07/30/2018 05/13/2018  Decreased Interest 1 1 1   Down, Depressed, Hopeless 1 0 0  PHQ - 2 Score 2 1 1   Altered sleeping 1 1 0  Tired, decreased energy 3 1 1   Change in appetite 2 1 1   Feeling bad or failure about yourself  1 0 0  Trouble concentrating 1 0 0  Moving slowly or fidgety/restless 0 0 0  Suicidal thoughts 0 0 0  PHQ-9 Score 10 4 3   Difficult doing work/chores Somewhat difficult Somewhat difficult -   GAD 7 : Generalized Anxiety Score 04/01/2019 07/30/2018 05/13/2018 04/30/2018  Nervous, Anxious, on Edge 3 1 1 1   Control/stop worrying 1 0 1 1  Worry too much - different things 0 0 1 1  Trouble relaxing 1  0 1 1  Restless 0 0 0 0  Easily annoyed or irritable 3 1 1 1   Afraid - awful might happen 0 1 0 0  Total GAD 7 Score 8 3 5 5   Anxiety Difficulty Somewhat difficult Somewhat difficult Somewhat difficult Somewhat difficult         Observations/Objective: LMP 03/02/2019 (Exact Date)  BP Readings from Last 3 Encounters:  02/11/19 129/73  10/28/18 124/61  08/05/18 138/88   Exam: Normal Speech.  NAD  Lab and Radiology Results No results found for this or any previous visit (from the past 72 hour(s)). No results found.     Assessment and Plan: 38 y.o. female with The primary encounter diagnosis was Anxiety state. Diagnoses of History of gastric bypass and Irritability and anger were also pertinent to this visit.  Let's at least evaluate for nutrient deficiencies which might be contributing to fatigue.  Longstanding issues with sleep.  Patient notes that she is going to visit her mom next week, is really looking forward to this as some time away from her husband and she feels like she might not be as irritable.  It sounds like her symptoms might be situational, there are some conflicts to be working out with her husband, she is working on getting a job with a better sleep schedule and work/life balance but financial issues due to  pandemic are also making this challenging.  Patient is reluctant to change psychiatric medications, she follows with a psychiatrist I advised talk to them about her symptoms as well.  I am here if she needs me!   I have emailed 1 of the counselors embedded in our office to see if she would be comfortable seeing this patient and her husband as a couple, or if she has any other resources for couples counseling that I can share with Mrs. Miron  PDMP not reviewed this encounter. Orders Placed This Encounter  Procedures  . Ferritin  . Folate  . Iron and TIBC  . VITAMIN D 25 Hydroxy (Vit-D Deficiency, Fractures)  . Vitamin B1  . Vitamin B12  . Zinc  .  Copper, serum  . CBC with Differential/Platelet  . COMPLETE METABOLIC PANEL WITH GFR   No orders of the defined types were placed in this encounter.  There are no Patient Instructions on file for this visit.     Follow Up Instructions: Return for RECHECK PENDING RESULTS / IF WORSE OR CHANGE.    I discussed the assessment and treatment plan with the patient. The patient was provided an opportunity to ask questions and all were answered. The patient agreed with the plan and demonstrated an understanding of the instructions.   The patient was advised to call back or seek an in-person evaluation if any new concerns, if symptoms worsen or if the condition fails to improve as anticipated.  25 minutes of non-face-to-face time was provided during this encounter.                      Historical information moved to improve visibility of documentation.  Past Medical History:  Diagnosis Date  . Anxiety   . Back pain   . Chronic pain of left knee   . Depression   . Diabetes mellitus without complication (HCC)    denies this diagnosis   . Family history of adverse reaction to anesthesia    per patient ; father was having abdominal surgery and had to be placed on medicine to keep his blood pressure up; he never had any issues with low BP before the surgery "   . Gallbladder problem   . GERD (gastroesophageal reflux disease)   . Gout    believes only occurred once in feet;   . Hypertension    denies this diagnosis   . Hypothyroidism   . Joint pain   . Obesity   . Osteoarthritis   . Palpitations   . PCOS (polycystic ovarian syndrome)   . PONV (postoperative nausea and vomiting)    with c section; no issues with followwing procedures   . Seasonal allergies   . Seizures (HCC)    has had "pre-seizure" activity in the brain but deneis any actual seizures   . Sleep apnea    does not currently use due to insurance   . Vitamin D deficiency    Past Surgical History:   Procedure Laterality Date  . CESAREAN SECTION    . CHOLECYSTECTOMY    . ESOPHAGOGASTRODUODENOSCOPY  2001 and 2018  . GANGLION CYST EXCISION Left 2014   foot  . KNEE ARTHROSCOPY Left 06/28/2017   Procedure: LEFT KNEE ARTHROSCOPY with debridement;  Surgeon: Kathryne HitchBlackman, Christopher Y, MD;  Location: [facility];  Service: Orthopedics;  Laterality: Left;  . LAPAROSCOPIC GASTRIC SLEEVE RESECTION N/A 07/22/2018   Procedure: LAPAROSCOPIC GASTRIC SLEEVE RESECTION WITH UPPER ENDO AND ERAS PATHWAY;  Surgeon:  Luretha MurphyMartin, Matthew, MD;  Location: [facility];  Service: General;  Laterality: N/A;   Social History   Tobacco Use  . Smoking status: Never Smoker  . Smokeless tobacco: Never Used  Substance Use Topics  . Alcohol use: Never    Frequency: Never   family history includes Anxiety disorder in her father; Asthma in an other family member; Cancer in her father; Depression in her father and mother; Hyperlipidemia in her father and mother; Hypertension in her father; Hypothyroidism in her mother and sister; Obesity in her father and mother; Polycystic ovary syndrome in her mother and sister.  Medications: Current Outpatient Medications  Medication Sig Dispense Refill  . albuterol (PROVENTIL HFA;VENTOLIN HFA) 108 (90 Base) MCG/ACT inhaler Inhale 1-2 puffs into the lungs every 4 (four) hours as needed for wheezing or shortness of breath (bronchospasm). 1 Inhaler 0  . AMBULATORY NON FORMULARY MEDICATION Take 1 each by mouth at bedtime. Medication Name: Melatonin Chew 6 mg    . calcium-vitamin D (OSCAL WITH D) 500-200 MG-UNIT tablet Take 1 tablet by mouth 3 (three) times daily.    . citalopram (CELEXA) 40 MG tablet Take 1 tablet (40 mg total) by mouth daily. 90 tablet 0  . clonazePAM (KLONOPIN) 0.5 MG tablet Take 0.5-1 tablets (0.25-0.5 mg total) by mouth 2 (two) times daily as needed for anxiety (#30 for 90 days.). 30 tablet 0  . colchicine 0.6 MG tablet Take 2 tabs (1.2mg ) followed by 1 tab (0.6mg ) 1 hour later.  Then 1 tab (0.6mg ) daily until pain resolves 30 tablet 1  . EPINEPHrine 0.3 mg/0.3 mL IJ SOAJ injection Inject 0.3 mg into the muscle daily as needed (for anaphylactic reactions.).     Marland Kitchen. gabapentin (NEURONTIN) 100 MG capsule Take 1-3 capsules (100-300 mg total) by mouth 3 (three) times daily as needed. 90 capsule 3  . ibuprofen (ADVIL) 200 MG tablet Take 200 mg by mouth as needed.    Marland Kitchen. levothyroxine (SYNTHROID, LEVOTHROID) 175 MCG tablet Take 1 tablet (175 mcg total) by mouth daily before breakfast. 90 tablet 1  . metFORMIN (GLUCOPHAGE-XR) 500 MG 24 hr tablet Take 2 tablets (1,000 mg total) by mouth at bedtime. (Patient taking differently: Take 1,500 mg by mouth at bedtime. ) 180 tablet 3  . methylphenidate (RITALIN) 10 MG tablet Take 15 mg by mouth 2 (two) times daily.  0  . mometasone (NASONEX) 50 MCG/ACT nasal spray One spray in each nostril twice a day, use left hand for right nostril, and right hand for left nostril.  Please dispense 3 bottles. Refill x3. 1 g 6  . Multiple Vitamins-Minerals (BARIATRIC FUSION) CHEW Chew 1 tablet by mouth 2 (two) times daily.    . norelgestromin-ethinyl estradiol (ORTHO EVRA) 150-35 MCG/24HR transdermal patch Place 1 patch onto the skin once a week. 9 patch 4  . nystatin (MYCOSTATIN/NYSTOP) powder Apply topically 4 (four) times daily. As needed for fungal skin infection 45 g 1  . ondansetron (ZOFRAN-ODT) 4 MG disintegrating tablet Take 4 mg by mouth 3 (three) times daily as needed for nausea/vomiting.  0  . pantoprazole (PROTONIX) 40 MG tablet Take 40 mg by mouth daily.  2  . traZODone (DESYREL) 50 MG tablet Take 0.5-2 tablets (25-100 mg total) by mouth at bedtime as needed for sleep. 30 tablet 3  . Turmeric 500 MG CAPS Take 1,000 mg by mouth at bedtime.    . vitamin B-12 (CYANOCOBALAMIN) 500 MCG tablet Take 500 mcg by mouth daily.     No current facility-administered  medications for this visit.    Allergies  Allergen Reactions  . Sulfa Antibiotics Hives and  Shortness Of Breath  . Other     Nuts severe reaction difficulty breathing , throat swelling; mostly tree nuts but not all tree nuts   . Adhesive [Tape] Rash    PDMP not reviewed this encounter. No orders of the defined types were placed in this encounter.  No orders of the defined types were placed in this encounter.

## 2019-04-05 ENCOUNTER — Encounter: Payer: Self-pay | Admitting: Osteopathic Medicine

## 2019-04-08 NOTE — Telephone Encounter (Signed)
Okay to order thyroid labs  I am not sure what letter she is talking about.  She might be talking about a list of couples counselors, I asked Janett Billow about this via email but I have not heard anything back yet.

## 2019-04-09 ENCOUNTER — Telehealth: Payer: BLUE CROSS/BLUE SHIELD | Admitting: Family

## 2019-04-09 ENCOUNTER — Encounter: Payer: Self-pay | Admitting: Family

## 2019-04-09 DIAGNOSIS — L259 Unspecified contact dermatitis, unspecified cause: Secondary | ICD-10-CM

## 2019-04-09 NOTE — Progress Notes (Signed)
E Visit for Rash  We are sorry that you are not feeling well. Here is how we plan to help!  Based on what you shared with me it looks like you have contact dermatitis.  Contact dermatitis is a skin rash caused by something that touches the skin and causes irritation or inflammation.  Your skin may be red, swollen, dry, cracked, and itch.  The rash should go away in a few days but can last a few weeks.  If you get a rash, it's important to figure out what caused it so the irritant can be avoided in the future.   Prednisone 10 mg daily for 6 days (see taper instructions below)  Directions for 6 day taper: Day 1: 2 tablets before breakfast, 1 after both lunch & dinner and 2 at bedtime Day 2: 1 tab before breakfast, 1 after both lunch & dinner and 2 at bedtime Day 3: 1 tab at each meal & 1 at bedtime Day 4: 1 tab at breakfast, 1 at lunch, 1 at bedtime Day 5: 1 tab at breakfast & 1 tab at bedtime Day 6: 1 tab at breakfast    HOME CARE:   Take cool showers and avoid direct sunlight.  Apply cool compress or wet dressings.  Take a bath in an oatmeal bath.  Sprinkle content of one Aveeno packet under running faucet with comfortably warm water.  Bathe for 15-20 minutes, 1-2 times daily.  Pat dry with a towel. Do not rub the rash.  Use hydrocortisone cream.  Take an antihistamine like Benadryl for widespread rashes that itch.  The adult dose of Benadryl is 25-50 mg by mouth 4 times daily.  Caution:  This type of medication may cause sleepiness.  Do not drink alcohol, drive, or operate dangerous machinery while taking antihistamines.  Do not take these medications if you have prostate enlargement.  Read package instructions thoroughly on all medications that you take.  GET HELP RIGHT AWAY IF:   Symptoms don't go away after treatment.  Severe itching that persists.  If you rash spreads or swells.  If you rash begins to smell.  If it blisters and opens or develops a yellow-brown  crust.  You develop a fever.  You have a sore throat.  You become short of breath.  MAKE SURE YOU:  Understand these instructions. Will watch your condition. Will get help right away if you are not doing well or get worse.  Thank you for choosing an e-visit. Your e-visit answers were reviewed by a board certified advanced clinical practitioner to complete your personal care plan. Depending upon the condition, your plan could have included both over the counter or prescription medications. Please review your pharmacy choice. Be sure that the pharmacy you have chosen is open so that you can pick up your prescription now.  If there is a problem you may message your provider in MyChart to have the prescription routed to another pharmacy. Your safety is important to us. If you have drug allergies check your prescription carefully.  For the next 24 hours, you can use MyChart to ask questions about today's visit, request a non-urgent call back, or ask for a work or school excuse from your e-visit provider. You will get an email in the next two days asking about your experience. I hope that your e-visit has been valuable and will speed your recovery.    Greater than 5 minutes, yet less than 10 minutes of time have been spent researching, coordinating,   and implementing care for this patient today.  Thank you for the details you included in the comment boxes. Those details are very helpful in determining the best course of treatment for you and help us to provide the best care.  

## 2019-04-09 NOTE — Telephone Encounter (Signed)
Spoke to patient, she does not need thyroid labs, she recently had these done. She was wanting to know if maybe Dr Sheppard Coil recommended her to get hormone testing such as testosterone/estrogen/etc., to see if hormones may be contributing to mood and irritability.   Letter patient was referring to was one that was written and looks like it was deleted in error, I have copy and pasted the notes and written pt a new letter. New letter pended, please review and send to pt if appropriate  Please advise on lab

## 2019-04-13 ENCOUNTER — Encounter: Payer: Self-pay | Admitting: Orthopaedic Surgery

## 2019-04-13 ENCOUNTER — Encounter: Payer: Self-pay | Admitting: Physician Assistant

## 2019-04-20 ENCOUNTER — Encounter: Payer: Self-pay | Admitting: Osteopathic Medicine

## 2019-04-20 ENCOUNTER — Telehealth (INDEPENDENT_AMBULATORY_CARE_PROVIDER_SITE_OTHER): Payer: BLUE CROSS/BLUE SHIELD | Admitting: Osteopathic Medicine

## 2019-04-20 VITALS — BP 145/57 | HR 71 | Temp 96.8°F | Wt 232.8 lb

## 2019-04-20 DIAGNOSIS — B379 Candidiasis, unspecified: Secondary | ICD-10-CM | POA: Diagnosis not present

## 2019-04-20 MED ORDER — FLUCONAZOLE 150 MG PO TABS: ORAL_TABLET | ORAL | 0 refills | Status: DC

## 2019-04-20 NOTE — Progress Notes (Signed)
Virtual Visit via Video (App used: MyChart) Note  I connected with      Michaela Holland on 04/20/19 at 3:20 PM by a telemedicine application and verified that I am speaking with the correct person using two identifiers.  Patient is at home I am in office   I discussed the limitations of evaluation and management by telemedicine and the availability of in person appointments. The patient expressed understanding and agreed to proceed.  History of Present Illness: Michaela Holland is a 38 y.o. female who would like to discuss yeast infection      . Location: vaginal . Quality: itching, dryness . Duration: 2+ weeks . Timing: constant . Context: took 1 pill diflucan 2 weeks ago, got a little better but not totally gone, took second pill last week, no significant improvement.       Observations/Objective: BP (!) 145/57 (Patient Position: Sitting, Cuff Size: Normal)   Pulse 71   Temp (!) 96.8 F (36 C) (Oral)   Wt 232 lb 12.8 oz (105.6 kg)   BMI 40.59 kg/m  BP Readings from Last 3 Encounters:  04/20/19 (!) 145/57  04/01/19 113/61  02/11/19 129/73   Exam: Normal Speech.  NAD  Lab and Radiology Results No results found for this or any previous visit (from the past 72 hour(s)). No results found.     Assessment and Plan: 38 y.o. female with The encounter diagnosis was Yeast infection.   PDMP not reviewed this encounter. No orders of the defined types were placed in this encounter.  Meds ordered this encounter  Medications  . fluconazole (DIFLUCAN) 150 MG tablet    Sig: Take 1 tablet (150 mg) every 3 days for 3 pills total    Dispense:  3 tablet    Refill:  0     Follow Up Instructions: Return if symptoms worsen or fail to improve.    I discussed the assessment and treatment plan with the patient. The patient was provided an opportunity to ask questions and all were answered. The patient agreed with the plan and demonstrated an understanding of the  instructions.   The patient was advised to call back or seek an in-person evaluation if any new concerns, if symptoms worsen or if the condition fails to improve as anticipated.  15 minutes of non-face-to-face time was provided during this encounter.                      Historical information moved to improve visibility of documentation.  Past Medical History:  Diagnosis Date  . Anxiety   . Back pain   . Chronic pain of left knee   . Depression   . Diabetes mellitus without complication (HCC)    denies this diagnosis   . Family history of adverse reaction to anesthesia    per patient ; father was having abdominal surgery and had to be placed on medicine to keep his blood pressure up; he never had any issues with low BP before the surgery "   . Gallbladder problem   . GERD (gastroesophageal reflux disease)   . Gout    believes only occurred once in feet;   . Hypertension    denies this diagnosis   . Hypothyroidism   . Joint pain   . Obesity   . Osteoarthritis   . Palpitations   . PCOS (polycystic ovarian syndrome)   . PONV (postoperative nausea and vomiting)    with c section;  no issues with followwing procedures   . Seasonal allergies   . Seizures ([place])    has had "pre-seizure" activity in the brain but deneis any actual seizures   . Sleep apnea    does not currently use due to insurance   . Vitamin D deficiency    Past Surgical History:  Procedure Laterality Date  . CESAREAN SECTION    . CHOLECYSTECTOMY    . ESOPHAGOGASTRODUODENOSCOPY  2001 and 2018  . GANGLION CYST EXCISION Left 2014   foot  . KNEE ARTHROSCOPY Left 06/28/2017   Procedure: LEFT KNEE ARTHROSCOPY with debridement;  Surgeon: Mcarthur Rossetti, MD;  Location: [facility];  Service: Orthopedics;  Laterality: Left;  . LAPAROSCOPIC GASTRIC SLEEVE RESECTION N/A 07/22/2018   Procedure: LAPAROSCOPIC GASTRIC SLEEVE RESECTION WITH UPPER ENDO AND ERAS PATHWAY;  Surgeon: Johnathan Hausen, MD;   Location: [facility];  Service: General;  Laterality: N/A;   Social History   Tobacco Use  . Smoking status: Never Smoker  . Smokeless tobacco: Never Used  Substance Use Topics  . Alcohol use: Never    Frequency: Never   family history includes Anxiety disorder in her father; Asthma in an other family member; Cancer in her father; Depression in her father and mother; Hyperlipidemia in her father and mother; Hypertension in her father; Hypothyroidism in her mother and sister; Obesity in her father and mother; Polycystic ovary syndrome in her mother and sister.  Medications: Current Outpatient Medications  Medication Sig Dispense Refill  . albuterol (PROVENTIL HFA;VENTOLIN HFA) 108 (90 Base) MCG/ACT inhaler Inhale 1-2 puffs into the lungs every 4 (four) hours as needed for wheezing or shortness of breath (bronchospasm). 1 Inhaler 0  . AMBULATORY NON FORMULARY MEDICATION Take 1 each by mouth at bedtime. Medication Name: Melatonin Chew 6 mg    . calcium-vitamin D (OSCAL WITH D) 500-200 MG-UNIT tablet Take 1 tablet by mouth 3 (three) times daily.    . clonazePAM (KLONOPIN) 0.5 MG tablet Take 0.5-1 tablets (0.25-0.5 mg total) by mouth 2 (two) times daily as needed for anxiety (#30 for 90 days.). 30 tablet 0  . colchicine 0.6 MG tablet Take 2 tabs (1.2mg ) followed by 1 tab (0.6mg ) 1 hour later. Then 1 tab (0.6mg ) daily until pain resolves 30 tablet 1  . EPINEPHrine 0.3 mg/0.3 mL IJ SOAJ injection Inject 0.3 mg into the muscle daily as needed (for anaphylactic reactions.).     Marland Kitchen gabapentin (NEURONTIN) 100 MG capsule Take 1-3 capsules (100-300 mg total) by mouth 3 (three) times daily as needed. 90 capsule 3  . ibuprofen (ADVIL) 200 MG tablet Take 200 mg by mouth as needed.    Marland Kitchen levothyroxine (SYNTHROID, LEVOTHROID) 175 MCG tablet Take 1 tablet (175 mcg total) by mouth daily before breakfast. 90 tablet 1  . metFORMIN (GLUCOPHAGE-XR) 500 MG 24 hr tablet Take 2 tablets (1,000 mg total) by mouth at bedtime.  (Patient taking differently: Take 1,500 mg by mouth at bedtime. ) 180 tablet 3  . methylphenidate (RITALIN) 10 MG tablet Take 15 mg by mouth 2 (two) times daily.  0  . mometasone (NASONEX) 50 MCG/ACT nasal spray One spray in each nostril twice a day, use left hand for right nostril, and right hand for left nostril.  Please dispense 3 bottles. Refill x3. 1 g 6  . Multiple Vitamins-Minerals (BARIATRIC FUSION) CHEW Chew 1 tablet by mouth 2 (two) times daily.    . norelgestromin-ethinyl estradiol (ORTHO EVRA) 150-35 MCG/24HR transdermal patch Place 1 patch onto the skin  once a week. 9 patch 4  . nystatin (MYCOSTATIN/NYSTOP) powder Apply topically 4 (four) times daily. As needed for fungal skin infection 45 g 1  . ondansetron (ZOFRAN-ODT) 4 MG disintegrating tablet Take 4 mg by mouth 3 (three) times daily as needed for nausea/vomiting.  0  . pantoprazole (PROTONIX) 40 MG tablet Take 40 mg by mouth daily.  2  . traZODone (DESYREL) 50 MG tablet Take 0.5-2 tablets (25-100 mg total) by mouth at bedtime as needed for sleep. 30 tablet 3  . Turmeric 500 MG CAPS Take 1,000 mg by mouth at bedtime.    . vitamin B-12 (CYANOCOBALAMIN) 500 MCG tablet Take 500 mcg by mouth daily.    . citalopram (CELEXA) 40 MG tablet Take 1 tablet (40 mg total) by mouth daily. 90 tablet 0  . fluconazole (DIFLUCAN) 150 MG tablet Take 1 tablet (150 mg) every 3 days for 3 pills total 3 tablet 0   No current facility-administered medications for this visit.    Allergies  Allergen Reactions  . Sulfa Antibiotics Hives and Shortness Of Breath  . Other     Nuts severe reaction difficulty breathing , throat swelling; mostly tree nuts but not all tree nuts   . Adhesive [Tape] Rash    PDMP not reviewed this encounter. No orders of the defined types were placed in this encounter.  Meds ordered this encounter  Medications  . fluconazole (DIFLUCAN) 150 MG tablet    Sig: Take 1 tablet (150 mg) every 3 days for 3 pills total     Dispense:  3 tablet    Refill:  0

## 2019-04-21 ENCOUNTER — Telehealth (INDEPENDENT_AMBULATORY_CARE_PROVIDER_SITE_OTHER): Payer: BLUE CROSS/BLUE SHIELD | Admitting: Family Medicine

## 2019-04-21 VITALS — Wt 232.0 lb

## 2019-04-21 DIAGNOSIS — M25562 Pain in left knee: Secondary | ICD-10-CM

## 2019-04-21 DIAGNOSIS — G8929 Other chronic pain: Secondary | ICD-10-CM | POA: Diagnosis not present

## 2019-04-21 NOTE — Progress Notes (Signed)
Virtual Visit  via Video Note  I connected with      Michaela Holland by a video enabled telemedicine application and verified that I am speaking with the correct person using two identifiers.   I discussed the limitations of evaluation and management by telemedicine and the availability of in person appointments. The patient expressed understanding and agreed to proceed.  History of Present Illness: Michaela Holland is a 38 y.o. female who would like to discuss her knee pain.  Michaela Holenne is been seen multiple times by myself and my orthopedic colleague Dr. Magnus IvanBlackman regarding her knee pain.  She developed pain and ultimately had trials of conservative management with little benefit.  She did ultimately have surgery last year which provided only a little bit of benefit.  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  Her increased demands of walking squatting pushing and pulling were effectively too much for her knee causing significant pain and dysfunction.  In the interim Michaela Holenne has had bariatric surgery and lost over 100 pounds.  She notes this is made a big difference in her quality of life.  She still has knee pain but overall is much better.  She notes increased ability to do normal activities at home such as walking do some light housework.  However when she walks more than about 5000 steps a day or she does a lot of heavy housework such as some squatting or bending or pushing or pulling she has much worse pain and has to rest or take it easy for the next day.  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  She does squatting is especially problematic.     Observations/Objective: Wt 232 lb (105.2 kg)   BMI 40.45 kg/m  Wt Readings from Last 5 Encounters:  04/21/19 232 lb (105.2 kg)  04/20/19 232 lb 12.8 oz (105.6  kg)  04/01/19 234 lb 11.2 oz (106.5 kg)  03/20/19 235 lb 11.2 oz (106.9 kg)  02/11/19 242 lb (109.8 kg)   Exam: Appearance non-toxic appearing. Normal Speech.    Lab and Radiology Results No results found for this or any previous visit (from the past 72 hour(s)). No results found.   Assessment and Plan: 38 y.o. female with knee pain.  Doing well much improved from previous however still having some symptoms.  This patient continues to lose weight and gain quadricep strength she should continue to have less pain.  Although I think at this point she is likely able to work a sedentary job I do not think that she can meet the full demands of inpatient nursing that she was doing previously.  Effectively inpatient nursing is a physically demanding job with frequent long days of lots of walking squatting pushing and pulling that she will not be able to do.  At this time I do not think that she can return to her prior injury position of inpatient nursing but could do light duty work or a more sedentary job.  Cannot return to prior to injury position of inpatient nursing.     Follow Up Instructions:    I discussed the assessment and treatment plan with the patient. The patient was provided an opportunity to ask questions and all were answered. The patient agreed  with the plan and demonstrated an understanding of the instructions.   The patient was advised to call back or seek an in-person evaluation if the symptoms worsen or if the condition fails to improve as anticipated.  Time: 15 minutes of intraservice time, with >22 minutes of total time during today's visit.      Historical information moved to improve visibility of documentation.  Past Medical History:  Diagnosis Date  . Anxiety   . Back pain   . Chronic pain of left knee   . Depression   . Diabetes mellitus without complication (HCC)    denies this diagnosis   . Family history of adverse reaction to anesthesia    per  patient ; father was having abdominal surgery and had to be placed on medicine to keep his blood pressure up; he never had any issues with low BP before the surgery "   . Gallbladder problem   . GERD (gastroesophageal reflux disease)   . Gout    believes only occurred once in feet;   . Hypertension    denies this diagnosis   . Hypothyroidism   . Joint pain   . Obesity   . Osteoarthritis   . Palpitations   . PCOS (polycystic ovarian syndrome)   . PONV (postoperative nausea and vomiting)    with c section; no issues with followwing procedures   . Seasonal allergies   . Seizures (HCC)    has had "pre-seizure" activity in the brain but deneis any actual seizures   . Sleep apnea    does not currently use due to insurance   . Vitamin D deficiency    Past Surgical History:  Procedure Laterality Date  . CESAREAN SECTION    . CHOLECYSTECTOMY    . ESOPHAGOGASTRODUODENOSCOPY  2001 and 2018  . GANGLION CYST EXCISION Left 2014   foot  . KNEE ARTHROSCOPY Left 06/28/2017   Procedure: LEFT KNEE ARTHROSCOPY with debridement;  Surgeon: Kathryne HitchBlackman, Christopher Y, MD;  Location: [facility];  Service: Orthopedics;  Laterality: Left;  . LAPAROSCOPIC GASTRIC SLEEVE RESECTION N/A 07/22/2018   Procedure: LAPAROSCOPIC GASTRIC SLEEVE RESECTION WITH UPPER ENDO AND ERAS PATHWAY;  Surgeon: Luretha MurphyMartin, Matthew, MD;  Location: [facility];  Service: General;  Laterality: N/A;   Social History   Tobacco Use  . Smoking status: Never Smoker  . Smokeless tobacco: Never Used  Substance Use Topics  . Alcohol use: Never    Frequency: Never   family history includes Anxiety disorder in her father; Asthma in an other family member; Cancer in her father; Depression in her father and mother; Hyperlipidemia in her father and mother; Hypertension in her father; Hypothyroidism in her mother and sister; Obesity in her father and mother; Polycystic ovary syndrome in her mother and sister.  Medications: Current Outpatient Medications   Medication Sig Dispense Refill  . albuterol (PROVENTIL HFA;VENTOLIN HFA) 108 (90 Base) MCG/ACT inhaler Inhale 1-2 puffs into the lungs every 4 (four) hours as needed for wheezing or shortness of breath (bronchospasm). 1 Inhaler 0  . AMBULATORY NON FORMULARY MEDICATION Take 1 each by mouth at bedtime. Medication Name: Melatonin Chew 6 mg    . calcium-vitamin D (OSCAL WITH D) 500-200 MG-UNIT tablet Take 1 tablet by mouth 3 (three) times daily.    . clonazePAM (KLONOPIN) 0.5 MG tablet Take 0.5-1 tablets (0.25-0.5 mg total) by mouth 2 (two) times daily as needed for anxiety (#30 for 90 days.). 30 tablet 0  . colchicine 0.6 MG tablet Take 2  tabs (1.2mg ) followed by 1 tab (0.6mg ) 1 hour later. Then 1 tab (0.6mg ) daily until pain resolves 30 tablet 1  . EPINEPHrine 0.3 mg/0.3 mL IJ SOAJ injection Inject 0.3 mg into the muscle daily as needed (for anaphylactic reactions.).     Marland Kitchen fluconazole (DIFLUCAN) 150 MG tablet Take 1 tablet (150 mg) every 3 days for 3 pills total 3 tablet 0  . gabapentin (NEURONTIN) 100 MG capsule Take 1-3 capsules (100-300 mg total) by mouth 3 (three) times daily as needed. 90 capsule 3  . ibuprofen (ADVIL) 200 MG tablet Take 200 mg by mouth as needed.    Marland Kitchen levothyroxine (SYNTHROID, LEVOTHROID) 175 MCG tablet Take 1 tablet (175 mcg total) by mouth daily before breakfast. 90 tablet 1  . metFORMIN (GLUCOPHAGE-XR) 500 MG 24 hr tablet Take 2 tablets (1,000 mg total) by mouth at bedtime. (Patient taking differently: Take 1,500 mg by mouth at bedtime. ) 180 tablet 3  . methylphenidate (RITALIN) 10 MG tablet Take 15 mg by mouth 2 (two) times daily.  0  . mometasone (NASONEX) 50 MCG/ACT nasal spray One spray in each nostril twice a day, use left hand for right nostril, and right hand for left nostril.  Please dispense 3 bottles. Refill x3. 1 g 6  . Multiple Vitamins-Minerals (BARIATRIC FUSION) CHEW Chew 1 tablet by mouth 2 (two) times daily.    . norelgestromin-ethinyl estradiol (ORTHO EVRA)  150-35 MCG/24HR transdermal patch Place 1 patch onto the skin once a week. 9 patch 4  . nystatin (MYCOSTATIN/NYSTOP) powder Apply topically 4 (four) times daily. As needed for fungal skin infection 45 g 1  . ondansetron (ZOFRAN-ODT) 4 MG disintegrating tablet Take 4 mg by mouth 3 (three) times daily as needed for nausea/vomiting.  0  . pantoprazole (PROTONIX) 40 MG tablet Take 40 mg by mouth daily.  2  . traZODone (DESYREL) 50 MG tablet Take 0.5-2 tablets (25-100 mg total) by mouth at bedtime as needed for sleep. 30 tablet 3  . Turmeric 500 MG CAPS Take 1,000 mg by mouth at bedtime.    . vitamin B-12 (CYANOCOBALAMIN) 500 MCG tablet Take 500 mcg by mouth daily.    . citalopram (CELEXA) 40 MG tablet Take 1 tablet (40 mg total) by mouth daily. 90 tablet 0   No current facility-administered medications for this visit.    Allergies  Allergen Reactions  . Sulfa Antibiotics Hives and Shortness Of Breath  . Other     Nuts severe reaction difficulty breathing , throat swelling; mostly tree nuts but not all tree nuts   . Adhesive [Tape] Rash

## 2019-05-06 ENCOUNTER — Encounter: Payer: Self-pay | Admitting: Family Medicine

## 2019-05-06 ENCOUNTER — Ambulatory Visit (INDEPENDENT_AMBULATORY_CARE_PROVIDER_SITE_OTHER): Payer: BLUE CROSS/BLUE SHIELD | Admitting: Family Medicine

## 2019-05-06 VITALS — BP 113/70 | HR 76 | Temp 97.4°F | Ht 63.5 in | Wt 225.0 lb

## 2019-05-06 DIAGNOSIS — J301 Allergic rhinitis due to pollen: Secondary | ICD-10-CM | POA: Diagnosis not present

## 2019-05-06 DIAGNOSIS — R6889 Other general symptoms and signs: Secondary | ICD-10-CM | POA: Diagnosis not present

## 2019-05-06 DIAGNOSIS — Z20822 Contact with and (suspected) exposure to covid-19: Secondary | ICD-10-CM

## 2019-05-06 DIAGNOSIS — Z20828 Contact with and (suspected) exposure to other viral communicable diseases: Secondary | ICD-10-CM | POA: Diagnosis not present

## 2019-05-06 MED ORDER — AZELASTINE HCL 0.1 % NA SOLN: 2.0000 | Freq: Two times a day (BID) | NASAL | 12 refills | Status: DC

## 2019-05-06 NOTE — Patient Instructions (Addendum)
Thank you for coming in today. You can take Certizine up to 2x daily.  Take astelin nasal spray in addition.  I do think getting a COVID test is a good idea.  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

## 2019-05-06 NOTE — Progress Notes (Signed)
Virtual Visit  via Video Note  I connected with      Michaela Holland by a video enabled telemedicine application and verified that I am speaking with the correct person using two identifiers.   I discussed the limitations of evaluation and management by telemedicine and the availability of in person appointments. The patient expressed understanding and agreed to proceed.  History of Present Illness: Michaela Holland is a 38 y.o. female who would like to discuss allergies or possibly COVID.   Last week patient developed sinus pain and pressure sneezing cough runny nose congestion itchy watery eyes.  She notes that she is visiting her parents in [place] and is harvest season and the symptoms are somewhat typical for her in this area at this time of the year.  However her husband and her daughter have similar symptoms.  Nobody has fever or severe cough or shortness of breath.  She is feeling a lot better now but still has some itchy watery eyes runny nose congestion and sneezing typical for allergies.  She is taking cetirizine daily which is working reasonably well.  She is planning on flying back to [facility] tomorrow and wants to know if it is safe to fly her she should get a to have a COVID-19 test.   Observations/Objective: BP 113/70   Pulse 76   Temp (!) 97.4 F (36.3 C) (Oral)   Ht 5' 3.5" (1.613 m)   Wt 225 lb (102.1 kg)   BMI 39.23 kg/m  Wt Readings from Last 5 Encounters:  05/06/19 225 lb (102.1 kg)  04/21/19 232 lb (105.2 kg)  04/20/19 232 lb 12.8 oz (105.6 kg)  04/01/19 234 lb 11.2 oz (106.5 kg)  03/20/19 235 lb 11.2 oz (106.9 kg)   Exam: Appearance nontoxic appearing Normal Speech.    Lab and Radiology Results No results found for this or any previous visit (from the past 72 hour(s)). No results found.   Assessment and Plan: 38 y.o. female with upper respiratory symptoms consistent with either allergies or possibly viral illness.  She clearly is not very  sick and it is a bit of a dilemma of whether she should have a COVID-19 test or not.  Given the fact that she is flying and other family members have mild symptoms I think it is reasonable to proceed with outpatient testing.  She will arrange for her own testing in [place].  In the meantime reasonable to maximize allergy medications.  Can take cetirizine up to twice daily and will prescribe Astelin nasal spray.  Okay to use over-the-counter Flonase or Rhinocort or Nasonex nasal steroid spray as well.  Precautions reviewed.  PDMP not reviewed this encounter. No orders of the defined types were placed in this encounter.  Meds ordered this encounter  Medications  . azelastine (ASTELIN) 0.1 % nasal spray    Sig: Place 2 sprays into both nostrils 2 (two) times daily. Use in each nostril as directed    Dispense:  30 mL    Refill:  12    Follow Up Instructions:    I discussed the assessment and treatment plan with the patient. The patient was provided an opportunity to ask questions and all were answered. The patient agreed with the plan and demonstrated an understanding of the instructions.   The patient was advised to call back or seek an in-person evaluation if the symptoms worsen or if the condition fails to improve as anticipated.  Time: 15  minutes of intraservice time, with >22 minutes of total time during today's visit.       Historical information moved to improve visibility of documentation.  Past Medical History:  Diagnosis Date  . Anxiety   . Back pain   . Chronic pain of left knee   . Depression   . Diabetes mellitus without complication (HCC)    denies this diagnosis   . Family history of adverse reaction to anesthesia    per patient ; father was having abdominal surgery and had to be placed on medicine to keep his blood pressure up; he never had any issues with low BP before the surgery "   . Gallbladder problem   . GERD (gastroesophageal reflux disease)   . Gout     believes only occurred once in feet;   . Hypertension    denies this diagnosis   . Hypothyroidism   . Joint pain   . Obesity   . Osteoarthritis   . Palpitations   . PCOS (polycystic ovarian syndrome)   . PONV (postoperative nausea and vomiting)    with c section; no issues with followwing procedures   . Seasonal allergies   . Seizures (HCC)    has had "pre-seizure" activity in the brain but deneis any actual seizures   . Sleep apnea    does not currently use due to insurance   . Vitamin D deficiency    Past Surgical History:  Procedure Laterality Date  . CESAREAN SECTION    . CHOLECYSTECTOMY    . ESOPHAGOGASTRODUODENOSCOPY  2001 and 2018  . GANGLION CYST EXCISION Left 2014   foot  . KNEE ARTHROSCOPY Left 06/28/2017   Procedure: LEFT KNEE ARTHROSCOPY with debridement;  Surgeon: Kathryne HitchBlackman, Christopher Y, MD;  Location: [facility];  Service: Orthopedics;  Laterality: Left;  . LAPAROSCOPIC GASTRIC SLEEVE RESECTION N/A 07/22/2018   Procedure: LAPAROSCOPIC GASTRIC SLEEVE RESECTION WITH UPPER ENDO AND ERAS PATHWAY;  Surgeon: Luretha MurphyMartin, Matthew, MD;  Location: [facility];  Service: General;  Laterality: N/A;   Social History   Tobacco Use  . Smoking status: Never Smoker  . Smokeless tobacco: Never Used  Substance Use Topics  . Alcohol use: Never    Frequency: Never   family history includes Anxiety disorder in her father; Asthma in an other family member; Cancer in her father; Depression in her father and mother; Hyperlipidemia in her father and mother; Hypertension in her father; Hypothyroidism in her mother and sister; Obesity in her father and mother; Polycystic ovary syndrome in her mother and sister.  Medications: Current Outpatient Medications  Medication Sig Dispense Refill  . albuterol (PROVENTIL HFA;VENTOLIN HFA) 108 (90 Base) MCG/ACT inhaler Inhale 1-2 puffs into the lungs every 4 (four) hours as needed for wheezing or shortness of breath (bronchospasm). 1 Inhaler 0  .  AMBULATORY NON FORMULARY MEDICATION Take 1 each by mouth at bedtime. Medication Name: Melatonin Chew 6 mg    . calcium-vitamin D (OSCAL WITH D) 500-200 MG-UNIT tablet Take 1 tablet by mouth 3 (three) times daily.    . clonazePAM (KLONOPIN) 0.5 MG tablet Take 0.5-1 tablets (0.25-0.5 mg total) by mouth 2 (two) times daily as needed for anxiety (#30 for 90 days.). 30 tablet 0  . colchicine 0.6 MG tablet Take 2 tabs (1.2mg ) followed by 1 tab (0.6mg ) 1 hour later. Then 1 tab (0.6mg ) daily until pain resolves 30 tablet 1  . EPINEPHrine 0.3 mg/0.3 mL IJ SOAJ injection Inject 0.3 mg into the muscle daily as needed (  for anaphylactic reactions.).     Marland Kitchen gabapentin (NEURONTIN) 100 MG capsule Take 1-3 capsules (100-300 mg total) by mouth 3 (three) times daily as needed. 90 capsule 3  . ibuprofen (ADVIL) 200 MG tablet Take 200 mg by mouth as needed.    Marland Kitchen levothyroxine (SYNTHROID, LEVOTHROID) 175 MCG tablet Take 1 tablet (175 mcg total) by mouth daily before breakfast. 90 tablet 1  . metFORMIN (GLUCOPHAGE-XR) 500 MG 24 hr tablet Take 2 tablets (1,000 mg total) by mouth at bedtime. 180 tablet 3  . methylphenidate (RITALIN) 10 MG tablet Take 15 mg by mouth 2 (two) times daily.  0  . Multiple Vitamins-Minerals (BARIATRIC FUSION) CHEW Chew 1 tablet by mouth 2 (two) times daily.    . norelgestromin-ethinyl estradiol (ORTHO EVRA) 150-35 MCG/24HR transdermal patch Place 1 patch onto the skin once a week. 9 patch 4  . nystatin (MYCOSTATIN/NYSTOP) powder Apply topically 4 (four) times daily. As needed for fungal skin infection 45 g 1  . ondansetron (ZOFRAN-ODT) 4 MG disintegrating tablet Take 4 mg by mouth 3 (three) times daily as needed for nausea/vomiting.  0  . pantoprazole (PROTONIX) 40 MG tablet Take 40 mg by mouth daily.  2  . traZODone (DESYREL) 50 MG tablet Take 0.5-2 tablets (25-100 mg total) by mouth at bedtime as needed for sleep. 30 tablet 3  . Turmeric 500 MG CAPS Take 1,000 mg by mouth at bedtime.    .  vitamin B-12 (CYANOCOBALAMIN) 500 MCG tablet Take 500 mcg by mouth daily.    Marland Kitchen azelastine (ASTELIN) 0.1 % nasal spray Place 2 sprays into both nostrils 2 (two) times daily. Use in each nostril as directed 30 mL 12  . citalopram (CELEXA) 40 MG tablet Take 1 tablet (40 mg total) by mouth daily. 90 tablet 0   No current facility-administered medications for this visit.    Allergies  Allergen Reactions  . Sulfa Antibiotics Hives and Shortness Of Breath  . Other     Nuts severe reaction difficulty breathing , throat swelling; mostly tree nuts but not all tree nuts   . Adhesive [Tape] Rash

## 2019-05-08 ENCOUNTER — Encounter (INDEPENDENT_AMBULATORY_CARE_PROVIDER_SITE_OTHER): Payer: Self-pay

## 2019-05-09 ENCOUNTER — Encounter (INDEPENDENT_AMBULATORY_CARE_PROVIDER_SITE_OTHER): Payer: Self-pay

## 2019-05-10 ENCOUNTER — Encounter (INDEPENDENT_AMBULATORY_CARE_PROVIDER_SITE_OTHER): Payer: Self-pay

## 2019-05-11 ENCOUNTER — Ambulatory Visit (INDEPENDENT_AMBULATORY_CARE_PROVIDER_SITE_OTHER): Payer: BLUE CROSS/BLUE SHIELD | Admitting: Orthopaedic Surgery

## 2019-05-11 ENCOUNTER — Encounter: Payer: Self-pay | Admitting: Orthopaedic Surgery

## 2019-05-11 ENCOUNTER — Telehealth: Payer: Self-pay

## 2019-05-11 ENCOUNTER — Encounter (INDEPENDENT_AMBULATORY_CARE_PROVIDER_SITE_OTHER): Payer: Self-pay

## 2019-05-11 ENCOUNTER — Ambulatory Visit: Payer: Self-pay

## 2019-05-11 VITALS — Ht 63.5 in | Wt 227.0 lb

## 2019-05-11 DIAGNOSIS — G8929 Other chronic pain: Secondary | ICD-10-CM

## 2019-05-11 DIAGNOSIS — M1712 Unilateral primary osteoarthritis, left knee: Secondary | ICD-10-CM | POA: Diagnosis not present

## 2019-05-11 DIAGNOSIS — M25562 Pain in left knee: Secondary | ICD-10-CM | POA: Diagnosis not present

## 2019-05-11 NOTE — Progress Notes (Signed)
The patient is very well-known to me.  She is a 38 year old female with arthritis involving her left knee.  She was morbidly obese at one point and has had bariatric surgery.  She is lost a significant amount of weight.  She still having issues with her left knee.  Unfortunately we have been unable to have her insurance company before cover hyaluronic acid.  Over they can do that now with her activity modification, her significant weight loss and failure of other conservative treatment measures.  She is too young right now to consider knee replacement surgery on it we want her to lose more weight and work on quad training exercises.  We last provided a steroid injection in her knee back in January of this year.  On examination of the left knee there is slight effusion comparing left and right knees.  Her right knee that is painless has no effusion and hyperextends.  Her left knee is straight but does not hyperextend.  She has significant medial joint line tenderness and patellofemoral crepitation.  There is also some varus malalignment of the left knee.  New x-rays of her left knee today are seen and there is continued medial joint space narrowing.  There is varus malalignment.  There are periarticular osteophytes around the patellofemoral joint and the medial compartment of the knee.  I commended her on the weight loss.  She does need to continue to work on quad strength exercises.  She is swims when she can which is difficult due to pools not being opened from the COVID-19 pandemic.  We will order hyaluronic acid for her knee which I think is medically necessary at this point.  This will be for left knee to treat the pain from osteoarthritis.  From a work standpoint, I do feel that she can participate in sedentary life physical demand work only.  She should avoid squatting and stooping.  She should not walk long distances.  She should mainly do sitdown type of work that does not involve any lifting as  well.  We will see her back in 4 weeks for hopefully placing hyaluronic acid into her left knee.

## 2019-05-11 NOTE — Telephone Encounter (Signed)
Left knee gel injection ?

## 2019-05-12 ENCOUNTER — Encounter (INDEPENDENT_AMBULATORY_CARE_PROVIDER_SITE_OTHER): Payer: Self-pay

## 2019-05-12 NOTE — Telephone Encounter (Signed)
Noted  

## 2019-05-13 ENCOUNTER — Encounter (INDEPENDENT_AMBULATORY_CARE_PROVIDER_SITE_OTHER): Payer: Self-pay

## 2019-05-15 ENCOUNTER — Encounter (INDEPENDENT_AMBULATORY_CARE_PROVIDER_SITE_OTHER): Payer: Self-pay

## 2019-05-17 ENCOUNTER — Encounter (INDEPENDENT_AMBULATORY_CARE_PROVIDER_SITE_OTHER): Payer: Self-pay

## 2019-05-18 ENCOUNTER — Encounter (INDEPENDENT_AMBULATORY_CARE_PROVIDER_SITE_OTHER): Payer: Self-pay

## 2019-05-19 ENCOUNTER — Encounter (INDEPENDENT_AMBULATORY_CARE_PROVIDER_SITE_OTHER): Payer: Self-pay

## 2019-05-22 ENCOUNTER — Telehealth: Payer: Self-pay

## 2019-05-22 NOTE — Telephone Encounter (Signed)
Submitted VOB for SynviscOne, left knee. 

## 2019-05-25 ENCOUNTER — Telehealth: Payer: Self-pay

## 2019-05-25 DIAGNOSIS — F900 Attention-deficit hyperactivity disorder, predominantly inattentive type: Secondary | ICD-10-CM | POA: Diagnosis not present

## 2019-05-25 NOTE — Telephone Encounter (Signed)
PA required for SynviscOne, left knee. Faxed completed PA form to [employer] at [phone]. 

## 2019-05-27 ENCOUNTER — Telehealth: Payer: Self-pay

## 2019-05-27 NOTE — Telephone Encounter (Signed)
Approved for SynviscOne, left knee. Buy & Bill Covered at 100% after Co-pay Co-pay of $40.00 required PA required PA Approval# [id number] Valid 05/25/2019- 05/24/2020 Units authorized-48 Units  Appt. 06/15/2019 with Dr. Ninfa Linden

## 2019-06-12 MED FILL — PANTOPRAZOLE SOD DR 40 MG T: 40 | 90 days supply | Qty: 90 | Fill #0

## 2019-06-15 ENCOUNTER — Ambulatory Visit (INDEPENDENT_AMBULATORY_CARE_PROVIDER_SITE_OTHER): Payer: BLUE CROSS/BLUE SHIELD | Admitting: Orthopaedic Surgery

## 2019-06-15 ENCOUNTER — Encounter: Payer: Self-pay | Admitting: Orthopaedic Surgery

## 2019-06-15 ENCOUNTER — Other Ambulatory Visit: Payer: Self-pay

## 2019-06-15 DIAGNOSIS — M1712 Unilateral primary osteoarthritis, left knee: Secondary | ICD-10-CM

## 2019-06-15 DIAGNOSIS — Z23 Encounter for immunization: Secondary | ICD-10-CM | POA: Diagnosis not present

## 2019-06-15 MED ORDER — HYLAN G-F 20 48 MG/6ML IX SOSY
48.0000 mg | PREFILLED_SYRINGE | INTRA_ARTICULAR | Status: AC | PRN
  Administered 2019-06-15: 48 mg via INTRA_ARTICULAR

## 2019-06-15 NOTE — Progress Notes (Signed)
   Procedure Note  Patient: Michaela Holland             Date of Birth: 1981/05/06           MRN: [id number]             Visit Date: 06/15/2019  Procedures: Visit Diagnoses:  1. Unilateral primary osteoarthritis, left knee     Large Joint Inj: L knee on 06/15/2019 10:53 AM Indications: pain and diagnostic evaluation Details: 22 G 1.5 in needle, superolateral approach  Arthrogram: No  Medications: 48 mg Hylan 48 MG/6ML Outcome: tolerated well, no immediate complications Procedure, treatment alternatives, risks and benefits explained, specific risks discussed. Consent was given by the patient. Immediately prior to procedure a time out was called to verify the correct patient, procedure, equipment, support staff and site/side marked as required. Patient was prepped and draped in the usual sterile fashion.    The patient is here today for scheduled hyaluronic acid injection in her left knee with Synvisc 1 to treat the pain from osteoarthritis.  This is been well-documented.  She is only 38 years old and used to be morbidly obese.  She has had bariatric surgery and is lost a significant amount of weight.  She then had a flareup of knee pain more recently when she was also working doing a lot more activities in August with her parents.  That caused a flareup of pain.  We did place a steroid injection in her knee and we felt was reasonable given the osteoarthritis is noted in her knee to provide a hyaluronic acid injection as well.  She is working on weight loss and activity modification is doing well overall.  On exam her right nonpainful knee hyperextends but her left knee is straight and does not hyperextend.  There is just a slight effusion about her left knee but it feels ligamentously stable.  Most of her pain is at the medial joint line.  I did place Synvisc 1 in her left knee without difficulty.  We can always repeat this in 6 months if needed.  All question concerns were answered and  addressed.  She also understands that she can get a steroid injection in 3 months from now and that left knee if needed.

## 2019-06-23 ENCOUNTER — Other Ambulatory Visit: Payer: Self-pay

## 2019-06-23 MED ORDER — GABAPENTIN 100 MG PO CAPS: 100.0000 mg | ORAL_CAPSULE | Freq: Three times a day (TID) | ORAL | 0 refills | Status: DC | PRN

## 2019-07-02 ENCOUNTER — Telehealth: Payer: Self-pay | Admitting: Orthopaedic Surgery

## 2019-07-02 NOTE — Telephone Encounter (Signed)
06/15/19 ov note faxed to [facility] [phone],claim# [id number]

## 2019-07-03 ENCOUNTER — Ambulatory Visit (INDEPENDENT_AMBULATORY_CARE_PROVIDER_SITE_OTHER): Payer: BLUE CROSS/BLUE SHIELD | Admitting: Family Medicine

## 2019-07-03 ENCOUNTER — Encounter: Payer: Self-pay | Admitting: Family Medicine

## 2019-07-03 VITALS — BP 108/61 | HR 96 | Temp 97.2°F | Wt 233.0 lb

## 2019-07-03 DIAGNOSIS — Z6841 Body Mass Index (BMI) 40.0 and over, adult: Secondary | ICD-10-CM

## 2019-07-03 DIAGNOSIS — R5383 Other fatigue: Secondary | ICD-10-CM | POA: Diagnosis not present

## 2019-07-03 DIAGNOSIS — E038 Other specified hypothyroidism: Secondary | ICD-10-CM

## 2019-07-03 DIAGNOSIS — E282 Polycystic ovarian syndrome: Secondary | ICD-10-CM

## 2019-07-03 DIAGNOSIS — Z9884 Bariatric surgery status: Secondary | ICD-10-CM

## 2019-07-03 MED ORDER — METFORMIN HCL ER 500 MG PO TB24: 500.0000 mg | ORAL_TABLET | Freq: Every day | ORAL | 3 refills | Status: DC

## 2019-07-03 NOTE — Progress Notes (Signed)
Virtual Visit  via Video Note  I connected with      Michaela Holland by a video enabled telemedicine application and verified that I am speaking with the correct person using two identifiers.   I discussed the limitations of evaluation and management by telemedicine and the availability of in person appointments. The patient expressed understanding and agreed to proceed.  History of Present Illness: Michaela Holland is a 38 y.o. female who would like to discuss fatigue, pregnancy plans, medication safety.  Michaela Holenne notes that she continues to be fatigued.  She was seen for this by her PCP in June.  She had initial lab work-up which was largely normal including normal TSH.  She notes since then she  continued to have some fatigue.  She notes that she does have a history of mild sleep apnea but was unable to tolerate CPAP.  She has lost quite a bit of weight with gastric sleeve surgery a year ago and thinks probably she does not have sleep apnea anymore.  Dr. Lyn HollingsheadAlexander did order labs in July which have not been done yet.  She is interested in getting those labs to keep working up her fatigue.  Additionally she is considering becoming pregnant again.  As noted above she lost over 100 pounds with gastric sleeve surgery about a year ago.  She wants to discuss possibility of pregnancy.  She currently uses the patch for contraception.  She takes medications as listed below although is willing to stop offending medications if needed.  Specifically she wants to know if she needs to keep taking metformin.  She currently is prescribed 1000 mg of extended release Metformin daily.  She notes that when she takes a full 1000 g it does cause some stomach upset and diarrhea.  She tolerates 500 much better.     Observations/Objective: BP 108/61   Pulse 96   Temp (!) 97.2 F (36.2 C) (Oral)   Wt 233 lb (105.7 kg)   BMI 40.63 kg/m  Wt Readings from Last 5 Encounters:  07/03/19 233 lb (105.7 kg)  05/11/19 227 lb  (103 kg)  05/06/19 225 lb (102.1 kg)  04/21/19 232 lb (105.2 kg)  04/20/19 232 lb 12.8 oz (105.6 kg)   Exam: Appearance nontoxic Normal Speech.    [facility] 07/03/2019 04/01/2019 07/30/2018 05/13/2018 04/30/2018  Decreased Interest 1 1 1 1 1   Down, Depressed, Hopeless 0 1 0 0 1  PHQ - 2 Score 1 2 1 1 2   Altered sleeping 1 1 1  0 0  Tired, decreased energy 2 3 1 1 1   Change in appetite 1 2 1 1 2   Feeling bad or failure about yourself  0 1 0 0 1  Trouble concentrating 0 1 0 0 0  Moving slowly or fidgety/restless 0 0 0 0 0  Suicidal thoughts 0 0 0 0 0  PHQ-9 Score 5 10 4 3 6   Difficult doing work/chores Somewhat difficult Somewhat difficult Somewhat difficult - Somewhat difficult  Some recent data might be hidden   GAD 7 : Generalized Anxiety Score 07/03/2019 04/01/2019 07/30/2018 05/13/2018  Nervous, Anxious, on Edge 1 3 1 1   Control/stop worrying 0 1 0 1  Worry too much - different things 0 0 0 1  Trouble relaxing 1 1 0 1  Restless 0 0 0 0  Easily annoyed or irritable 1 3 1 1   Afraid - awful might happen 0 0 1 0  Total GAD 7  Score Anxiety Difficulty Somewhat difficult Somewhat difficult Somewhat difficult Somewhat difficult      Lab and Radiology Results No results found for this or any previous visit (from the past 72 hour(s)). No results found.   Assessment and Plan: 38 y.o. female with  Fatigue.  Unclear etiology likely multifactorial.  Plan for laboratory work-up as arranged by Dr. Lyn Hollingshead which is very broad.  We will also add TSH as that should be rechecked as it was last checked about 5 months ago.  If labs are nondiagnostic would consider thinking about sleep apnea and retry CPAP.  It may fit or be more tolerable now.  Pregnancy planning: Discussed that due to her age she is considered more higher risk.  Additionally her history of obesity also is higher risk.  That being said I do not see any insurmountable barriers to her pregnancy.   Reasonable to make sure that she continues to take folic acid which should be in her bariatric multivitamin then proceed with pregnancy attempt.  Discussed medication pregnancy risk with her various medications.  Metformin: Reasonable to continue lower dose Metformin at 500 mg daily.  This should potentially help her get pregnant and also likely is helping metabolic state with history of severe obesity PCOS and prediabetes.  PDMP not reviewed this encounter. Orders Placed This Encounter  Procedures  . TSH   Meds ordered this encounter  Medications  . metFORMIN (GLUCOPHAGE-XR) 500 MG 24 hr tablet    Sig: Take 1 tablet (500 mg total) by mouth at bedtime.    Dispense:  90 tablet    Refill:  3    Follow Up Instructions:    I discussed the assessment and treatment plan with the patient. The patient was provided an opportunity to ask questions and all were answered. The patient agreed with the plan and demonstrated an understanding of the instructions.   The patient was advised to call back or seek an in-person evaluation if the symptoms worsen or if the condition fails to improve as anticipated.  Time:25 minutes of intraservice time, with >39 minutes of total time during today's visit.       Historical information moved to improve visibility of documentation.  Past Medical History:  Diagnosis Date  . Anxiety   . Back pain   . Chronic pain of left knee   . Depression   . Diabetes mellitus without complication (HCC)    denies this diagnosis   . Family history of adverse reaction to anesthesia    per patient ; father was having abdominal surgery and had to be placed on medicine to keep his blood pressure up; he never had any issues with low BP before the surgery "   . Gallbladder problem   . GERD (gastroesophageal reflux disease)   . Gout    believes only occurred once in feet;   . Hypertension    denies this diagnosis   . Hypothyroidism   . Joint pain   . Obesity   .  Osteoarthritis   . Palpitations   . PCOS (polycystic ovarian syndrome)   . PONV (postoperative nausea and vomiting)    with c section; no issues with followwing procedures   . Seasonal allergies   . Seizures (HCC)    has had "pre-seizure" activity in the brain but deneis any actual seizures   . Sleep apnea    does not currently use due to insurance   . Vitamin D deficiency  Past Surgical History:  Procedure Laterality Date  . CESAREAN SECTION    . CHOLECYSTECTOMY    . ESOPHAGOGASTRODUODENOSCOPY  2001 and 2018  . GANGLION CYST EXCISION Left 2014   foot  . KNEE ARTHROSCOPY Left 06/28/2017   Procedure: LEFT KNEE ARTHROSCOPY with debridement;  Surgeon: Mcarthur Rossetti, MD;  Location: [facility];  Service: Orthopedics;  Laterality: Left;  . LAPAROSCOPIC GASTRIC SLEEVE RESECTION N/A 07/22/2018   Procedure: LAPAROSCOPIC GASTRIC SLEEVE RESECTION WITH UPPER ENDO AND ERAS PATHWAY;  Surgeon: Johnathan Hausen, MD;  Location: [facility];  Service: General;  Laterality: N/A;   Social History   Tobacco Use  . Smoking status: Never Smoker  . Smokeless tobacco: Never Used  Substance Use Topics  . Alcohol use: Never    Frequency: Never   family history includes Anxiety disorder in her father; Asthma in an other family member; Cancer in her father; Depression in her father and mother; Hyperlipidemia in her father and mother; Hypertension in her father; Hypothyroidism in her mother and sister; Obesity in her father and mother; Polycystic ovary syndrome in her mother and sister.  Medications: Current Outpatient Medications  Medication Sig Dispense Refill  . albuterol (PROVENTIL HFA;VENTOLIN HFA) 108 (90 Base) MCG/ACT inhaler Inhale 1-2 puffs into the lungs every 4 (four) hours as needed for wheezing or shortness of breath (bronchospasm). 1 Inhaler 0  . AMBULATORY NON FORMULARY MEDICATION Take 1 each by mouth at bedtime. Medication Name: Melatonin Chew 6 mg    . azelastine (ASTELIN) 0.1 % nasal  spray Place 2 sprays into both nostrils 2 (two) times daily. Use in each nostril as directed 30 mL 12  . calcium-vitamin D (OSCAL WITH D) 500-200 MG-UNIT tablet Take 1 tablet by mouth 3 (three) times daily.    . clonazePAM (KLONOPIN) 0.5 MG tablet Take 0.5-1 tablets (0.25-0.5 mg total) by mouth 2 (two) times daily as needed for anxiety (#30 for 90 days.). 30 tablet 0  . EPINEPHrine 0.3 mg/0.3 mL IJ SOAJ injection Inject 0.3 mg into the muscle daily as needed (for anaphylactic reactions.).     Marland Kitchen gabapentin (NEURONTIN) 100 MG capsule Take 1-3 capsules (100-300 mg total) by mouth 3 (three) times daily as needed. 90 capsule 0  . ibuprofen (ADVIL) 200 MG tablet Take 200 mg by mouth as needed.    Marland Kitchen levothyroxine (SYNTHROID, LEVOTHROID) 175 MCG tablet Take 1 tablet (175 mcg total) by mouth daily before breakfast. 90 tablet 1  . metFORMIN (GLUCOPHAGE-XR) 500 MG 24 hr tablet Take 1 tablet (500 mg total) by mouth at bedtime. 90 tablet 3  . methylphenidate (RITALIN) 10 MG tablet Take 15 mg by mouth 2 (two) times daily.  0  . Multiple Vitamins-Minerals (BARIATRIC FUSION) CHEW Chew 1 tablet by mouth 2 (two) times daily.    . norelgestromin-ethinyl estradiol (ORTHO EVRA) 150-35 MCG/24HR transdermal patch Place 1 patch onto the skin once a week. 9 patch 4  . nystatin (MYCOSTATIN/NYSTOP) powder Apply topically 4 (four) times daily. As needed for fungal skin infection 45 g 1  . ondansetron (ZOFRAN-ODT) 4 MG disintegrating tablet Take 4 mg by mouth 3 (three) times daily as needed for nausea/vomiting.  0  . pantoprazole (PROTONIX) 40 MG tablet Take 40 mg by mouth daily.  2  . traZODone (DESYREL) 50 MG tablet Take 0.5-2 tablets (25-100 mg total) by mouth at bedtime as needed for sleep. 30 tablet 3  . Turmeric 500 MG CAPS Take 1,000 mg by mouth at bedtime.    . vitamin  B-12 (CYANOCOBALAMIN) 500 MCG tablet Take 500 mcg by mouth daily.    . citalopram (CELEXA) 40 MG tablet Take 1 tablet (40 mg total) by mouth daily. 90  tablet 0   No current facility-administered medications for this visit.    Allergies  Allergen Reactions  . Sulfa Antibiotics Hives and Shortness Of Breath  . Other     Nuts severe reaction difficulty breathing , throat swelling; mostly tree nuts but not all tree nuts   . Adhesive [Tape] Rash

## 2019-07-03 NOTE — Patient Instructions (Signed)
Thank you for coming in today. Get labs soon.  Ok to use metformin 500 daily.  Ok to consider pregnancy but you will be high risk.

## 2019-07-13 ENCOUNTER — Telehealth: Payer: Self-pay | Admitting: Orthopaedic Surgery

## 2019-07-13 DIAGNOSIS — Z20828 Contact with and (suspected) exposure to other viral communicable diseases: Secondary | ICD-10-CM | POA: Diagnosis not present

## 2019-07-13 NOTE — Telephone Encounter (Signed)
Received vm from pt needing 8/31 ov note refaxed to [facility]. I faxed [phone], claim# [id number]

## 2019-07-15 ENCOUNTER — Other Ambulatory Visit: Payer: Self-pay

## 2019-07-15 ENCOUNTER — Encounter: Payer: BLUE CROSS/BLUE SHIELD | Attending: Surgery | Admitting: Dietician

## 2019-07-15 DIAGNOSIS — E669 Obesity, unspecified: Secondary | ICD-10-CM | POA: Insufficient documentation

## 2019-07-15 NOTE — Progress Notes (Signed)
Bariatric Follow-Up Visit Medical Nutrition Therapy  Appt Start Time: 11:00am   End Time: 11:40am  12 Months Post-Operative Sleeve Gastrectomy Surgery Surgery Date: 07/22/2018   NUTRITION ASSESSMENT  Anthropometrics  Start weight at [facility]: 322.5lbs (04/30/2018) Today's weight: 238.6 lbs  Total weight lost: - 83.9 lbs  Body Composition Scale 08/05/2018 09/18/2018 11/06/2018 01/20/2019 07/15/2019  Weight  lbs 300.2 278.2 263.2 248 238.6  BMI 52.3 48.5 45.7 43.2 41.3  Total Body Fat  % 55 53.5 45.8  43.8     Visceral Fat     13  Fat-Free Mass  % 45 46.5 54.2  56.1     Total Body Water  % 33.7 34.7 41.4  42.5     Muscle-Mass  lbs     32  Body Fat Displacement              Torso  lbs     64.8         Left Leg  lbs     12.9         Right Leg  lbs     12.9         Left Arm  lbs     6.4         Right Arm  lbs     6.4     Clinical Medical Hx: obesity, vitamin D deficiency, sleep apnea, PCOS, osteoarthritis, hypothyroidism, HTN, gout, GERD, depression, anxiety Medications: see list  Lifestyle & Dietary Hx Working from home.  Pt is talkative. Pt lives with her husband and their 64 year old daughter. Pt's husband had bariatric surgery as well.   Dinners may include meatballs (or other protein) with vegetables (such as beets and carrots) and potato. Includes lots of vegetables at dinner. Snacks include cheese, pie, cheese and crackers, pickles with deli meat and mayo, apples with peanut butter, and trail mix. Breakfasts may include oatmeal with peanut butter or banana with peanut butter.  States the past 3-4 months have been very difficult for her, feeling as though she has not been eating well and snacking on the "wrong things." Been traveling due to going to care for her father who has terminal cancer. States she is working on better handling her depression and comfort eating. States that, in March at the beginning of COVID-19 pandemic, she did well with meal planning which has been more  difficult lately. States she has experienced weight regain on and off over the past few months and that this frustrates her. States she was not excited for this "1 year mark" because she is not where she originally hoped to be.   24-Hr Dietary Recall First Meal: coffee + protein shake   Snack: eggs + salsa + cheese   Second Meal: 1/2 [employer] grilled chicken cool wrap + 5 waffle fries + 4 chicken nuggets  Snack: candy    Third Meal: meat + spaghetti squash + tomato sauce  Snack: none Beverages: water, coffee, protein shake, milk, diet Snapple   Supplements: bariatric MVI, calcium  Estimated Daily Fluid Intake: 60 to 64 oz Estimated Daily Protein Intake: 60+ g  Physical Activity  Current average weekly physical activity: limited dt knee injury, but moving more by playing with dog and daughter; goal is minimum daily step count (4,000+) and to do as much as possible without exacerbating knee pain    NUTRITION DIAGNOSIS  Overweight/obesity ([place]-3.3) related to past poor dietary habits and physical inactivity as evidenced by patient w/ completed Sleeve Gastrectomy  surgery following dietary guidelines for continued weight loss.   NUTRITION INTERVENTION Nutrition counseling (C-1) and education (E-2) to facilitate bariatric surgery goals, including: . Diet advancement to the next phase (phase 7) now including all foods for lifelong maintenance  . The importance of consuming adequate calories as well as certain nutrients daily due to the body's need for essential vitamins, minerals, and fats . The importance of daily physical activity and to reach a goal of at least 150 minutes of moderate to vigorous physical activity weekly (or as directed by their physician) due to benefits such as increased musculature and improved lab values . Patient was provided counseling and encouragement on reframing how she looks at weight loss and her journey thus far. Focused on turning attention towards providing  the body nutrition and establishing good lifestyle habits, rather than focusing on the scale. Patient was reminded of the multiple factors that influence weight and how fluctuations are normal and do not indicate "failure." Patient was praised for her tremendous progress thus far and reminded that this is a lifelong journey that isn't over, just because she is 1 year post-op. She was encouraged to continue utilizing her mental health provider and participating in the bariatric support group.   Handouts Provided Include   Phase 7: Lifelong Maintenance    Bariatric Meal Ideas   Bariatric Snack Ideas   Bariatric Tips for Eating Out    Learning Style & Readiness for Change Teaching method utilized: Auditory  Demonstrated degree of understanding via: Teach Back  Barriers to learning/adherence to lifestyle change: Stress, Depression   MONITORING & EVALUATION Dietary intake, weekly physical activity, body weight, and goals in 2 months.  Next Steps Patient is to follow-up in 2 months for continued nutrition education.

## 2019-07-17 DIAGNOSIS — E038 Other specified hypothyroidism: Secondary | ICD-10-CM | POA: Diagnosis not present

## 2019-07-17 DIAGNOSIS — Z9884 Bariatric surgery status: Secondary | ICD-10-CM | POA: Diagnosis not present

## 2019-07-17 LAB — TSH: TSH: 0.53 mIU/L

## 2019-07-20 ENCOUNTER — Encounter: Payer: Self-pay | Admitting: Osteopathic Medicine

## 2019-07-20 LAB — CBC WITH DIFFERENTIAL/PLATELET
Absolute Monocytes: 367 cells/uL (ref 200–950)
Basophils Absolute: 41 cells/uL (ref 0–200)
Basophils Relative: 0.6 %
Eosinophils Absolute: 177 cells/uL (ref 15–500)
Eosinophils Relative: 2.6 %
HCT: 39.7 % (ref 35.0–45.0)
Hemoglobin: 13 g/dL (ref 11.7–15.5)
Lymphs Abs: 1550 cells/uL (ref 850–3900)
MCH: 28.7 pg (ref 27.0–33.0)
MCHC: 32.7 g/dL (ref 32.0–36.0)
MCV: 87.6 fL (ref 80.0–100.0)
MPV: 10.1 fL (ref 7.5–12.5)
Monocytes Relative: 5.4 %
Neutro Abs: 4665 cells/uL (ref 1500–7800)
Neutrophils Relative %: 68.6 %
Platelets: 332 10*3/uL (ref 140–400)
RBC: 4.53 10*6/uL (ref 3.80–5.10)
RDW: 11.8 % (ref 11.0–15.0)
Total Lymphocyte: 22.8 %
WBC: 6.8 10*3/uL (ref 3.8–10.8)

## 2019-07-20 LAB — COMPLETE METABOLIC PANEL WITH GFR
AG Ratio: 1.5 (calc) (ref 1.0–2.5)
ALT: 12 U/L (ref 6–29)
AST: 15 U/L (ref 10–30)
Albumin: 4 g/dL (ref 3.6–5.1)
Alkaline phosphatase (APISO): 45 U/L (ref 31–125)
BUN: 15 mg/dL (ref 7–25)
CO2: 29 mmol/L (ref 20–32)
Calcium: 9.2 mg/dL (ref 8.6–10.2)
Chloride: 104 mmol/L (ref 98–110)
Creat: 0.73 mg/dL (ref 0.50–1.10)
GFR, Est African American: 121 mL/min/{1.73_m2} (ref >=60)
GFR, Est Non African American: 104 mL/min/{1.73_m2} (ref >=60)
Globulin: 2.7 g/dL (calc) (ref 1.9–3.7)
Glucose, Bld: 89 mg/dL (ref 65–99)
Potassium: 4.7 mmol/L (ref 3.5–5.3)
Sodium: 140 mmol/L (ref 135–146)
Total Bilirubin: 0.5 mg/dL (ref 0.2–1.2)
Total Protein: 6.7 g/dL (ref 6.1–8.1)

## 2019-07-20 LAB — IRON, TOTAL/TOTAL IRON BINDING CAP
%SAT: 30 % (calc) (ref 16–45)
Iron: 113 ug/dL (ref 40–190)
TIBC: 383 mcg/dL (calc) (ref 250–450)

## 2019-07-20 LAB — VITAMIN B1: Vitamin B1 (Thiamine): 26 nmol/L (ref 8–30)

## 2019-07-20 LAB — FERRITIN: Ferritin: 13 ng/mL — ABNORMAL LOW (ref 16–154)

## 2019-07-20 LAB — FOLATE: Folate: 19 ng/mL

## 2019-07-20 LAB — ZINC: Zinc: 63 ug/dL (ref 60–130)

## 2019-07-20 LAB — COPPER, SERUM: Copper: 182 ug/dL — ABNORMAL HIGH (ref 70–175)

## 2019-07-20 LAB — VITAMIN D 25 HYDROXY (VIT D DEFICIENCY, FRACTURES): Vit D, 25-Hydroxy: 33 ng/mL (ref 30–100)

## 2019-07-21 ENCOUNTER — Encounter: Payer: Self-pay | Admitting: Osteopathic Medicine

## 2019-07-31 ENCOUNTER — Other Ambulatory Visit: Payer: Self-pay

## 2019-07-31 DIAGNOSIS — Z20822 Contact with and (suspected) exposure to covid-19: Secondary | ICD-10-CM

## 2019-08-03 LAB — NOVEL CORONAVIRUS, NAA: SARS-CoV-2, NAA: NOT DETECTED

## 2019-08-10 DIAGNOSIS — F4323 Adjustment disorder with mixed anxiety and depressed mood: Secondary | ICD-10-CM | POA: Diagnosis not present

## 2019-08-17 DIAGNOSIS — F4323 Adjustment disorder with mixed anxiety and depressed mood: Secondary | ICD-10-CM | POA: Diagnosis not present

## 2019-08-24 DIAGNOSIS — F4323 Adjustment disorder with mixed anxiety and depressed mood: Secondary | ICD-10-CM | POA: Diagnosis not present

## 2019-08-25 DIAGNOSIS — F900 Attention-deficit hyperactivity disorder, predominantly inattentive type: Secondary | ICD-10-CM | POA: Diagnosis not present

## 2019-08-27 ENCOUNTER — Encounter: Payer: Self-pay | Admitting: Osteopathic Medicine

## 2019-08-31 DIAGNOSIS — F4323 Adjustment disorder with mixed anxiety and depressed mood: Secondary | ICD-10-CM | POA: Diagnosis not present

## 2019-09-01 ENCOUNTER — Other Ambulatory Visit: Payer: Self-pay | Admitting: Osteopathic Medicine

## 2019-09-09 DIAGNOSIS — F4323 Adjustment disorder with mixed anxiety and depressed mood: Secondary | ICD-10-CM | POA: Diagnosis not present

## 2019-09-15 ENCOUNTER — Ambulatory Visit: Payer: BLUE CROSS/BLUE SHIELD | Admitting: Dietician

## 2019-09-17 ENCOUNTER — Ambulatory Visit (INDEPENDENT_AMBULATORY_CARE_PROVIDER_SITE_OTHER): Payer: BC Managed Care – PPO | Admitting: Psychiatry

## 2019-09-17 DIAGNOSIS — F4323 Adjustment disorder with mixed anxiety and depressed mood: Secondary | ICD-10-CM | POA: Diagnosis not present

## 2019-09-17 DIAGNOSIS — F509 Eating disorder, unspecified: Secondary | ICD-10-CM | POA: Diagnosis not present

## 2019-09-17 IMAGING — DX DG FOOT COMPLETE 3+V*L*
3 series · 3 of 3 positions shown · non-contrast
Comparison: None.

CLINICAL DATA: Acute left foot pain after rolling injury.

EXAM:
LEFT FOOT - COMPLETE 3+ VIEW

[foot ap]
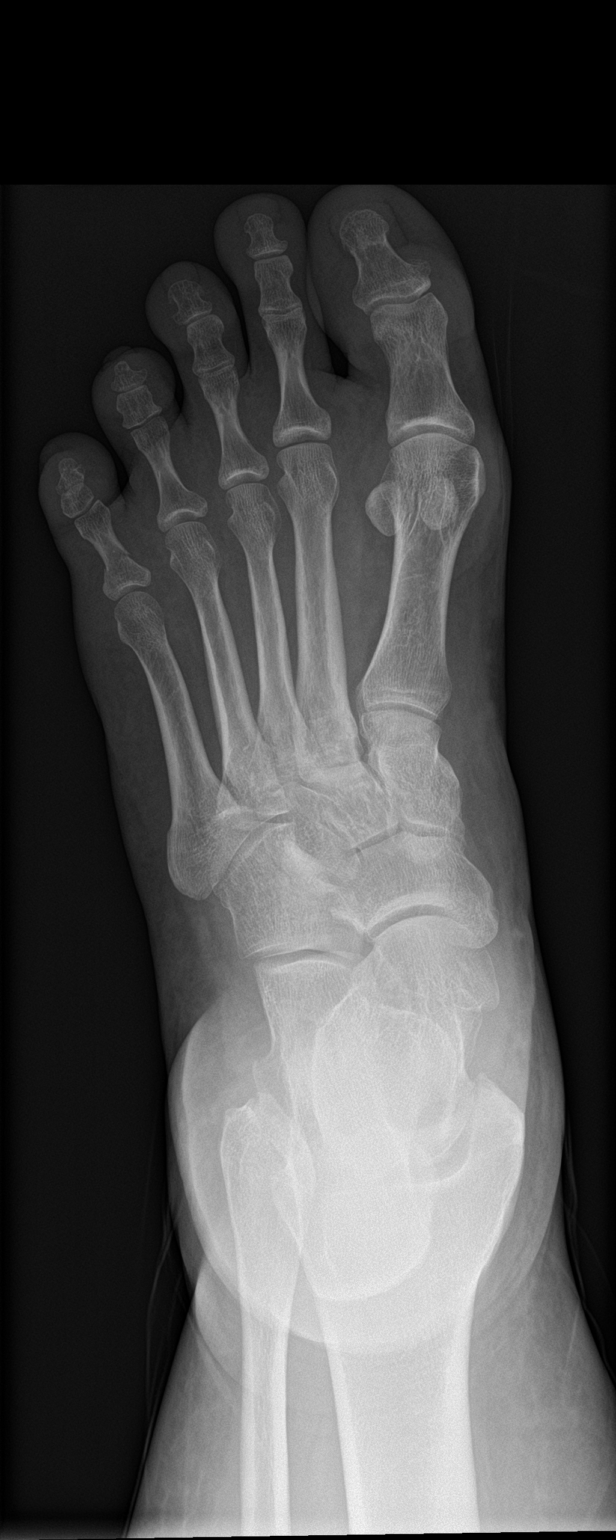

[foot obl]
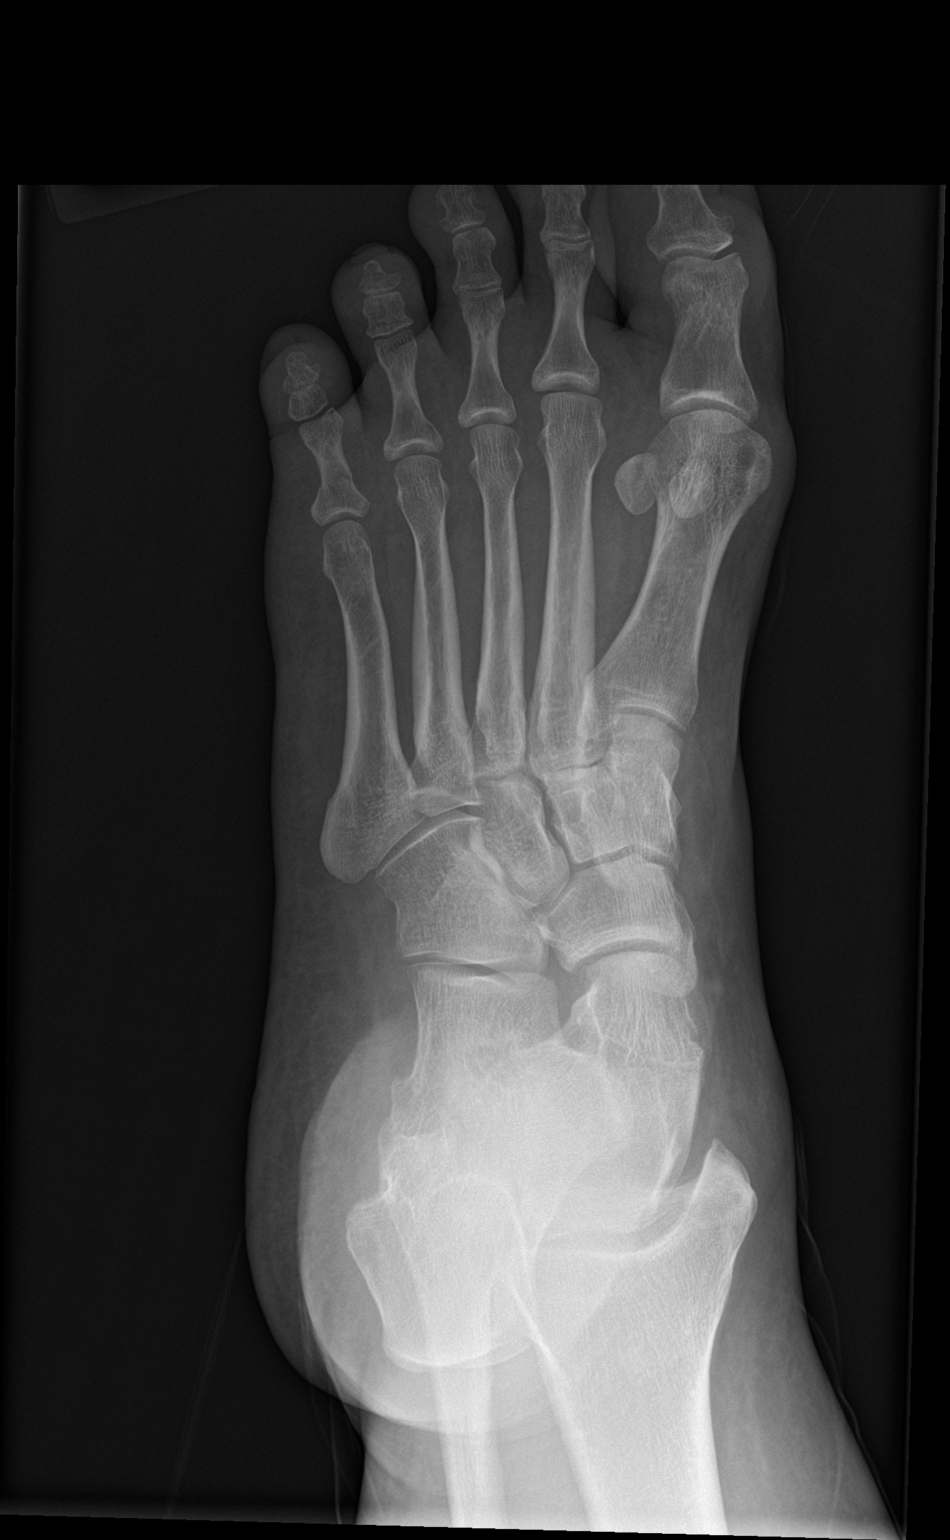

[foot lat]
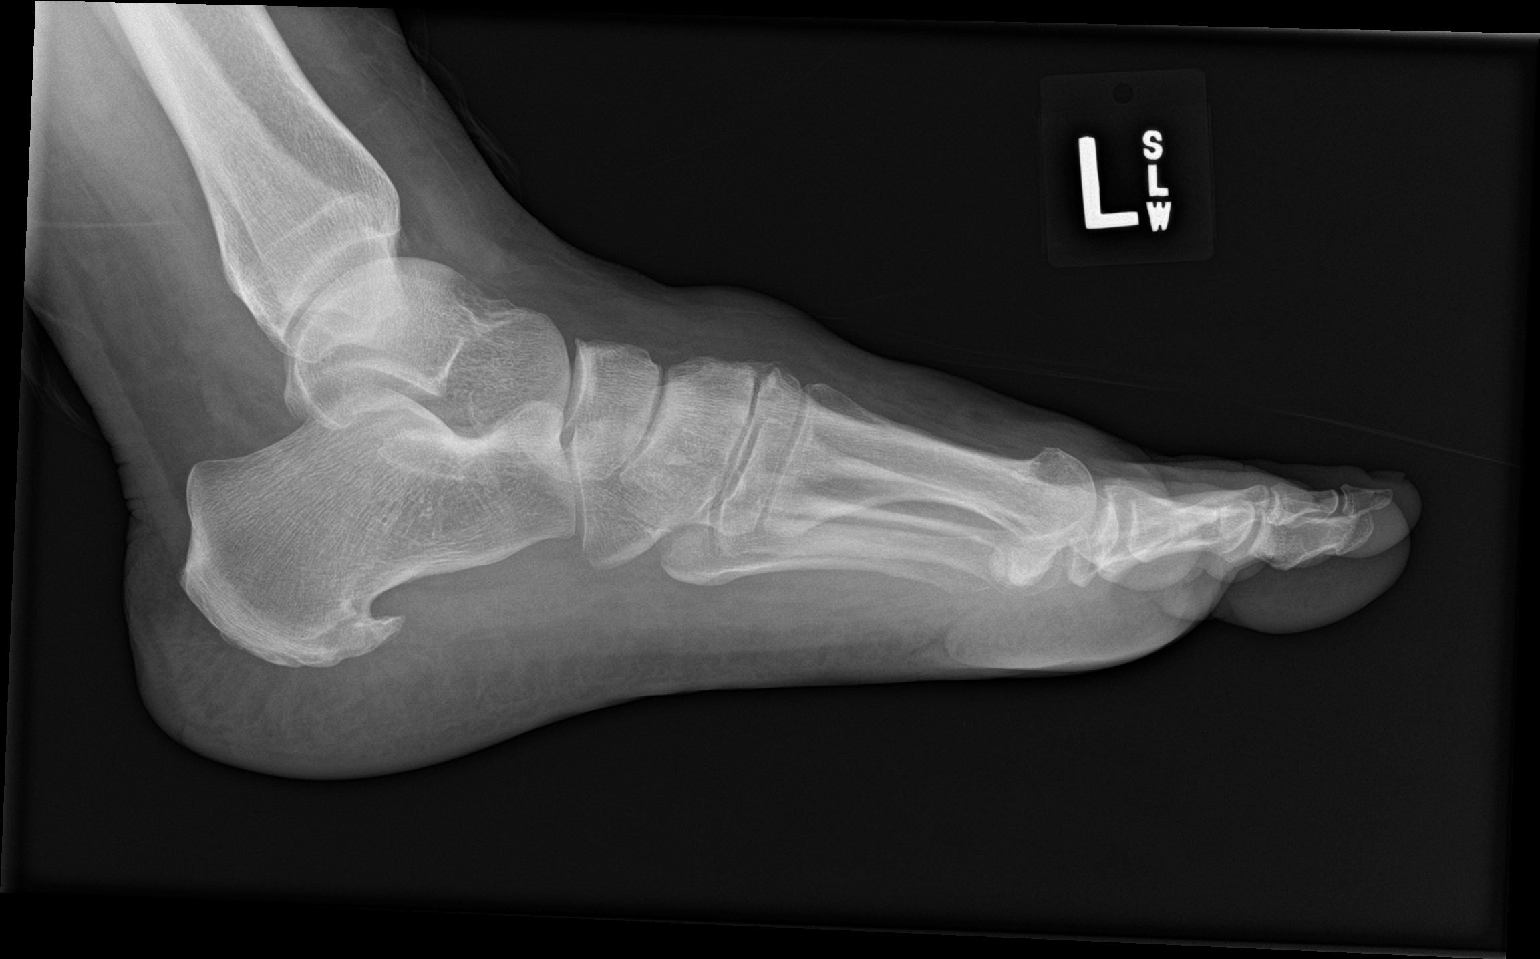

[3 of 3 positions shown; findings below may reference images not displayed]

FINDINGS: Minimally displaced fifth proximal phalangeal fracture is noted.
Joint spaces are intact. Mild posterior calcaneal spurring is noted.
No soft tissue abnormality is noted.
IMPRESSION: Minimally displaced fifth proximal phalangeal fracture.

## 2019-09-22 DIAGNOSIS — F4323 Adjustment disorder with mixed anxiety and depressed mood: Secondary | ICD-10-CM | POA: Diagnosis not present

## 2019-09-27 ENCOUNTER — Other Ambulatory Visit: Payer: Self-pay | Admitting: Osteopathic Medicine

## 2019-09-28 ENCOUNTER — Encounter: Payer: Self-pay | Admitting: Osteopathic Medicine

## 2019-09-28 ENCOUNTER — Telehealth: Payer: Self-pay

## 2019-09-28 NOTE — Telephone Encounter (Signed)
[facility] pharmacy updated to proceed with change via fax. Confirmation received.

## 2019-09-28 NOTE — Telephone Encounter (Signed)
Verbal order: "yes" 

## 2019-09-28 NOTE — Telephone Encounter (Signed)
As per [facility] pharrmacy - "Can we change manufacturer? [employer] brand has been discontinued". Pls advise, thanks.

## 2019-09-30 DIAGNOSIS — F4323 Adjustment disorder with mixed anxiety and depressed mood: Secondary | ICD-10-CM | POA: Diagnosis not present

## 2019-10-02 DIAGNOSIS — F900 Attention-deficit hyperactivity disorder, predominantly inattentive type: Secondary | ICD-10-CM | POA: Diagnosis not present

## 2019-10-07 ENCOUNTER — Encounter: Payer: Self-pay | Admitting: Dietician

## 2019-10-07 ENCOUNTER — Encounter: Payer: BC Managed Care – PPO | Attending: Surgery | Admitting: Dietician

## 2019-10-07 ENCOUNTER — Ambulatory Visit: Payer: BC Managed Care – PPO | Admitting: Dietician

## 2019-10-07 DIAGNOSIS — E669 Obesity, unspecified: Secondary | ICD-10-CM | POA: Diagnosis not present

## 2019-10-07 DIAGNOSIS — Z9884 Bariatric surgery status: Secondary | ICD-10-CM

## 2019-10-07 NOTE — Patient Instructions (Addendum)
   Track everything you eat and drink during the day. Do this at least 2 days per week.   Continue to practice not drinking fluids before/during/after meals.   Seek out your support system (mental health provider, support group, dietitian, family, friend, etc.) as much as needed!

## 2019-10-07 NOTE — Progress Notes (Signed)
Bariatric Follow-Up Visit Medical Nutrition Therapy  Appt Start Time: 10:30am   End Time: 11:00am  1 Year + 2 Months Post-Operative Sleeve Gastrectomy Surgery Surgery Date: 07/22/2018  MyChart Visit  This visit was completed via MyChart due to the COVID-19 pandemic.   I spoke with Michaela Holland. Carrillo and verified that I was speaking with the correct person with two patient identifiers (full name and date of birth).   I discussed the limitations related to this kind of visit and the patient is willing to proceed.    NUTRITION ASSESSMENT  Anthropometrics  Start weight at [facility]: 322.5lbs (04/30/2018)  Body Composition Scale 08/05/2018 09/18/2018 11/06/2018 01/20/2019 07/15/2019  Weight  lbs 300.2 278.2 263.2 248 238.6  BMI 52.3 48.5 45.7 43.2 41.3  Total Body Fat  % 55 53.5 45.8  43.8     Visceral Fat     13  Fat-Free Mass  % 45 46.5 54.2  56.1     Total Body Water  % 33.7 34.7 41.4  42.5     Muscle-Mass  lbs     32  Body Fat Displacement              Torso  lbs     64.8         Left Leg  lbs     12.9         Right Leg  lbs     12.9         Left Arm  lbs     6.4         Right Arm  lbs     6.4   Lifestyle & Dietary Hx Patient states that she continues to struggle with emotional eating. States she thinks that she is eating too frequently, and is consuming more carbohydrate containing foods. States she is not motivated to make changes, despite her success and progress previously made. States she continues to have coffee with protein shake as the cream in the mornings. May have keto bread with natural peanut butter and jelly for breakfast.   Supplements: bariatric MVI, calcium  Estimated Daily Fluid Intake: ~64 oz Estimated Daily Protein Intake: 50-60 g  Physical Activity  Current average weekly physical activity: limited dt knee injury   NUTRITION DIAGNOSIS  Overweight/obesity ([place]-3.3) related to past poor dietary habits and physical inactivity as evidenced by patient w/ completed Sleeve  Gastrectomy surgery following dietary guidelines for continued weight loss.   NUTRITION INTERVENTION Nutrition counseling (C-1) and education (E-2) to facilitate bariatric surgery goals, including: . The importance of consuming adequate calories as well as certain nutrients daily due to the body's need for essential vitamins, minerals, and fats . Patient was provided counseling and encouragement on reframing how she looks at weight loss and her journey thus far. Focused on turning attention towards providing the body nutrition and establishing good lifestyle habits, rather than focusing on the scale. Patient was reminded of the multiple factors that influence weight and how fluctuations are normal and do not indicate "failure."  . Encouraged to continue utilizing her mental health provider and participating in the bariatric support group.   Handouts Provided Include   Things To Do    (provided via email at [email or web address])   Learning Style & Readiness for Change Teaching method utilized: Auditory  Demonstrated degree of understanding via: Teach Back  Barriers to learning/adherence to lifestyle change: Stress, Depression   MONITORING & EVALUATION Dietary intake, weekly physical activity, body weight, and goals in 1  month.  Next Steps Patient is to follow-up in 1 month via MyChart for continued nutrition education.

## 2019-10-14 DIAGNOSIS — F4323 Adjustment disorder with mixed anxiety and depressed mood: Secondary | ICD-10-CM | POA: Diagnosis not present

## 2019-10-20 ENCOUNTER — Telehealth (INDEPENDENT_AMBULATORY_CARE_PROVIDER_SITE_OTHER): Payer: BC Managed Care – PPO | Admitting: Nurse Practitioner

## 2019-10-20 ENCOUNTER — Ambulatory Visit (INDEPENDENT_AMBULATORY_CARE_PROVIDER_SITE_OTHER): Payer: BC Managed Care – PPO

## 2019-10-20 ENCOUNTER — Encounter: Payer: Self-pay | Admitting: Nurse Practitioner

## 2019-10-20 ENCOUNTER — Other Ambulatory Visit: Payer: Self-pay

## 2019-10-20 VITALS — BP 129/77 | HR 66 | Temp 97.2°F

## 2019-10-20 DIAGNOSIS — M545 Low back pain, unspecified: Secondary | ICD-10-CM

## 2019-10-20 MED ORDER — CYCLOBENZAPRINE HCL 10 MG PO TABS: 10.0000 mg | ORAL_TABLET | Freq: Three times a day (TID) | ORAL | 2 refills | Status: DC | PRN

## 2019-10-20 MED ORDER — PREDNISONE 20 MG PO TABS: 20.0000 mg | ORAL_TABLET | Freq: Two times a day (BID) | ORAL | 0 refills | Status: DC

## 2019-10-20 NOTE — Progress Notes (Addendum)
Virtual Visit via MyChart Note  I connected with  Michaela Holland on 10/20/19 at  8:30 AM EST by the video enabled telemedicine application, MyChart, and verified that I am speaking with the correct person using two identifiers.   I introduced myself as a Publishing rights manager with the practice. We discussed the limitations of evaluation and management by telemedicine and the availability of in person appointments. The patient expressed understanding and agreed to proceed.  The patient is: at home I am: in the office  Subjective:    CC: back pain  HPI: Michaela Holland is a 39 y.o. y/o female presenting via MyChart today for new onset of back pain for approximately 1 week.  She reports a history of chronic back pain, but reports this is a new type of pain.  She does report she had some nausea yesterday (appx 1 hour after a meal), but it has resolved and she feels that this was due to something that she ate.  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  She reports the lower back pain is greater on the left side than the right and radiates into the left hip and some into the left lower abdomen.  She does report the pain will wake her up at night.  She reports her pain 5/10 at this time.  She has been taking 300 mg gabapentin and 400mg  ibuprofen at night with minimal relief.  She describes the pain as throbbing and dull and always present.  She says occasionally the pain is sharp and changes with movement.  She reports the pain is worse with movement and nothing seems to make it completely better.  She reports it is present when she wakes and does not improve throughout the day.  She has tried some exercise and stretching with minimal relief.  She does report heat and massage will help some.  She has a massage scheduled for this Thursday. She denies burning with urination, frequency, incontinence, urgency, fever, chills, diarrhea, constipation, loss of  bowel or bladder control.  She denies radiation into her lower extremities.   Past medical history, Surgical history, Family history not pertinant except as noted below, Social history, Allergies, and medications have been entered into the medical record, reviewed, and corrections made.   Review of Systems:  General: No fevers, chills, fatigue, night sweats, weight loss.   Neuro: No headache, numbness, paresthesias GI: No abdominal pain, vomiting, diarrhea, changes in bowel habits, anorexia  GU: No dysuria, hematuria, pelvic pain, urinary incontinence Skin:No rash, color changes   Objective:    General: Speaking clearly in complete sentences without any shortness of breath.  Alert and oriented x3.  Normal judgment. No apparent acute distress.   Impression and Recommendations:    1. Acute bilateral low back pain without sciatica Acute on chronic low back pain.  At this time there is no evidence of urinary or bowel involvement.   Will obtain x-rays of the lumbar spine and trial a 5-day prednisone burst.  The patient has had a gastric sleeve so caution must be taken with NSAID use. Patient encouraged to continue with her massage scheduled for Thursday, continue to use heat and gentle stretching exercises to help with pain and tightness.  Lidocaine patches may be beneficial as well.  Discussed the option of starting muscle relaxants as opposed to long-acting NSAIDs.  She would like to try a muscle relaxer at this time as these have  helped in the past with her pain. Will determine recommendations for follow-up once x-ray results are received.  Consider referral to sports medicine versus follow-up with me in 2 weeks.  Will discuss with patient via MyChart. - DG Lumbar Spine Complete - predniSONE (DELTASONE) 20 MG tablet; Take 1 tablet (20 mg total) by mouth 2 (two) times daily with a meal. Take one tab with breakfast and one tab with lunch.  Dispense: 10 tablet; Refill: 0     I discussed the  assessment and treatment plan with the patient. The patient was provided an opportunity to ask questions and all were answered. The patient agreed with the plan and demonstrated an understanding of the instructions.   The patient was advised to call back or seek an in-person evaluation if the symptoms worsen or if the condition fails to improve as anticipated.  I provided 35 minutes of non-face-to-face interaction with this [place] visit.   Orma Render, NP

## 2019-10-20 NOTE — Patient Instructions (Signed)
You may want to try some of these gentle stretches in addition to yoga to see if this helps with your back pain.   Another option is to trial lidocaine patches to the lower back. You can use these for up to 12 hours at a time. They are available over the counter at 4% strength and are less expensive that way than through a prescription.    Spondylolisthesis Rehab Ask your health care provider which exercises are safe for you. Do exercises exactly as told by your health care provider and adjust them as directed. It is normal to feel mild stretching, pulling, tightness, or discomfort as you do these exercises. Stop right away if you feel sudden pain or your pain gets worse. Do not begin these exercises until told by your health care provider. Stretching and range-of-motion exercises These exercises warm up your muscles and joints and improve the movement and flexibility of your hips and back. These exercises may also help to relieve pain, numbness, and tingling. Single knee to chest  1. Lie on your back on a firm surface with both legs straight. 2. Bend one of your knees. Use your hands to move your knee up toward your chest until you feel a gentle stretch in your lower back and buttock. ? Hold your leg in this position by holding on to the front of your knee. ? Keep your other leg as straight as possible. 3. Hold for __________ seconds. 4. Slowly return to the starting position. 5. Repeat this exercise with your other leg. Repeat __________ times. Complete this exercise __________ times a day. Double knee to chest  1. Lie on your back on a firm surface with both legs straight. 2. Bend one of your knees and move it toward your chest until you feel a gentle stretch in your lower back and buttock. 3. Tense your abdominal muscles and repeat the previous step with your other leg. 4. Hold both of your legs in this position by holding on to the backs of your thighs or the fronts of your  knees. 5. Hold for __________ seconds. 6. Tense your abdominal muscles and slowly move your legs back to the floor, one leg at a time. Repeat __________ times. Complete this exercise __________ times a day. Strengthening exercises These exercises build strength and endurance in your back. Endurance is the ability to use your muscles for a long time, even after they get tired. Pelvic tilt This exercise strengthens the muscles that lie deep in the abdomen (deep abdominals). 1. Lie on your back on a firm surface. Bend your knees and keep your feet flat. 2. Tense your abdominal muscles. Tip your pelvis up toward the ceiling and flatten your lower back into the floor. ? To help with this exercise, you may place a small towel under your lower back and try to push your back into the towel. 3. Hold for __________ seconds. 4. Let your muscles relax completely before you repeat this exercise. Repeat __________ times. Complete this exercise __________ times a day. Abdominal crunch  1. Lie on your back on a firm surface. Bend your knees and keep your feet flat. Cross your arms over your chest. 2. Tuck your chin down toward your chest, without bending your neck. 3. Use your abdominal muscles to lift your upper body off the ground, straight up into the air. ? Try to lift yourself until your shoulder blades are off the ground. You may need to work up to this. ? Keep  your lower back on the ground while you crunch upward. ? Do not hold your breath. 4. Slowly lower yourself down. Keep your abdominal muscles tense until you are back to the starting position. Repeat __________ times. Complete this exercise __________ times a day. Alternating arm and leg raises  1. Get on your hands and knees on a firm surface. If you are on a hard floor, you may want to use padding, such as an exercise mat, to cushion your knees. 2. Line up your arms and legs. Your hands should be directly below your shoulders, and your knees  should be directly below your hips. 3. Lift your left leg behind you. At the same time, raise your right arm and straighten it in front of you. ? Do not lift your leg higher than your hip. ? Do not lift your arm higher than your shoulder. ? Keep your abdominal and back muscles tight. ? Keep your hips facing the ground. ? Do not arch your back. ? Keep your balance carefully, and do not hold your breath. 4. Hold for __________ seconds. 5. Slowly return to the starting position. 6. Repeat with your right leg and your left arm. Repeat __________ times. Complete this exercise __________ times a day. Posture and body mechanics Good posture and healthy body mechanics can help to relieve stress in your body's tissues and joints. Body mechanics refers to the movements and positions of your body while you do your daily activities. Posture is part of body mechanics. Good posture means:  Your spine is in its natural S-curve position (neutral).  Your shoulders are pulled back slightly.  Your head is not tipped forward. Follow these guidelines to improve your posture and body mechanics in your everyday activities. Standing   When standing, keep your spine neutral and your feet about hip width apart. Keep a slight bend in your knees. Your ears, shoulders, and hips should line up.  When you do a task in which you stand in one place for a long time, place one foot up on a stable object that is 2-4 inches (5-10 cm) high, such as a footstool. This helps keep your spine neutral. Sitting   When sitting, keep your spine neutral and keep your feet flat on the floor. Use a footrest, if necessary, and keep your thighs parallel to the floor. Avoid rounding your shoulders, and avoid tilting your head forward.  When working at a desk or a computer, keep your desk at a height where your hands are slightly lower than your elbows. Slide your chair under your desk so you are close enough to maintain good  posture.  When working at a computer, place your monitor at a height where you are looking straight ahead and you do not have to tilt your head forward or downward to look at the screen. Resting   When lying down and resting, avoid positions that are most painful for you.  If you have pain with activities such as sitting, bending, stooping, or squatting (flexion-basedactivities), lie in a position in which your body does not bend very much. For example, avoid curling up on your side with your arms and knees near your chest (fetal position).  If you have pain with activities such as standing for a long time or reaching with your arms (extension-basedactivities), lie with your spine in a neutral position and bend your knees slightly. Try the following positions: ? Lying on your side with a pillow between your knees. ? Lying on  your back with a pillow under your knees. Lifting   When lifting objects, keep your feet at least shoulder width apart and tighten your abdominal muscles.  Bend your knees and hips and keep your spine neutral. It is important to lift using the strength of your legs, not your back. Do not lock your knees straight out.  Always ask for help to lift heavy or awkward objects. This information is not intended to replace advice given to you by your health care provider. Make sure you discuss any questions you have with your health care provider. Document Revised: 12/18/2018 Document Reviewed: 10/22/2018 Elsevier Patient Education  2020 [employer] Acute Back Pain, Adult Acute back pain is sudden and usually short-lived. It is often caused by an injury to the muscles and tissues in the back. The injury may result from:  A muscle or ligament getting overstretched or torn (strained). Ligaments are tissues that connect bones to each other. Lifting something improperly can cause a back strain.  Wear and tear (degeneration) of the spinal disks. Spinal disks are circular tissue  that provides cushioning between the bones of the spine (vertebrae).  Twisting motions, such as while playing sports or doing yard work.  A hit to the back.  Arthritis. You may have a physical exam, lab tests, and imaging tests to find the cause of your pain. Acute back pain usually goes away with rest and home care. Follow these instructions at home: Managing pain, stiffness, and swelling  Take over-the-counter and prescription medicines only as told by your health care provider.  Your health care provider may recommend applying ice during the first 24-48 hours after your pain starts. To do this: ? Put ice in a plastic bag. ? Place a towel between your skin and the bag. ? Leave the ice on for 20 minutes, 2-3 times a day.  If directed, apply heat to the affected area as often as told by your health care provider. Use the heat source that your health care provider recommends, such as a moist heat pack or a heating pad. ? Place a towel between your skin and the heat source. ? Leave the heat on for 20-30 minutes. ? Remove the heat if your skin turns bright red. This is especially important if you are unable to feel pain, heat, or cold. You have a greater risk of getting burned. Activity   Do not stay in bed. Staying in bed for more than 1-2 days can delay your recovery.  Sit up and stand up straight. Avoid leaning forward when you sit, or hunching over when you stand. ? If you work at a desk, sit close to it so you do not need to lean over. Keep your chin tucked in. Keep your neck drawn back, and keep your elbows bent at a right angle. Your arms should look like the letter "L." ? Sit high and close to the steering wheel when you drive. Add lower back (lumbar) support to your car seat, if needed.  Take short walks on even surfaces as soon as you are able. Try to increase the length of time you walk each day.  Do not sit, drive, or stand in one place for more than 30 minutes at a time.  Sitting or standing for long periods of time can put stress on your back.  Do not drive or use heavy machinery while taking prescription pain medicine.  Use proper lifting techniques. When you bend and lift, use positions that put  less stress on your back: ? Bend your knees. ? Keep the load close to your body. ? Avoid twisting.  Exercise regularly as told by your health care provider. Exercising helps your back heal faster and helps prevent back injuries by keeping muscles strong and flexible.  Work with a physical therapist to make a safe exercise program, as recommended by your health care provider. Do any exercises as told by your physical therapist. Lifestyle  Maintain a healthy weight. Extra weight puts stress on your back and makes it difficult to have good posture.  Avoid activities or situations that make you feel anxious or stressed. Stress and anxiety increase muscle tension and can make back pain worse. Learn ways to manage anxiety and stress, such as through exercise. General instructions  Sleep on a firm mattress in a comfortable position. Try lying on your side with your knees slightly bent. If you lie on your back, put a pillow under your knees.  Follow your treatment plan as told by your health care provider. This may include: ? Cognitive or behavioral therapy. ? Acupuncture or massage therapy. ? Meditation or yoga. Contact a health care provider if:  You have pain that is not relieved with rest or medicine.  You have increasing pain going down into your legs or buttocks.  Your pain does not improve after 2 weeks.  You have pain at night.  You lose weight without trying.  You have a fever or chills. Get help right away if:  You develop new bowel or bladder control problems.  You have unusual weakness or numbness in your arms or legs.  You develop nausea or vomiting.  You develop abdominal pain.  You feel faint. Summary  Acute back pain is sudden and  usually short-lived.  Use proper lifting techniques. When you bend and lift, use positions that put less stress on your back.  Take over-the-counter and prescription medicines and apply heat or ice as directed by your health care provider. This information is not intended to replace advice given to you by your health care provider. Make sure you discuss any questions you have with your health care provider. Document Revised: 12/16/2018 Document Reviewed: 04/10/2017 Elsevier Patient Education  2020 [employer].

## 2019-10-20 NOTE — Addendum Note (Signed)
Addended by: Jeriel Vivanco, [place] Dec E on: 10/20/2019 10:23 AM   Modules accepted: Orders

## 2019-10-21 ENCOUNTER — Encounter: Payer: Self-pay | Admitting: Nurse Practitioner

## 2019-10-27 ENCOUNTER — Telehealth: Payer: BC Managed Care – PPO | Admitting: Osteopathic Medicine

## 2019-10-28 ENCOUNTER — Encounter: Payer: Self-pay | Admitting: Dietician

## 2019-10-28 ENCOUNTER — Encounter: Payer: BC Managed Care – PPO | Attending: Surgery | Admitting: Dietician

## 2019-10-28 DIAGNOSIS — E669 Obesity, unspecified: Secondary | ICD-10-CM | POA: Insufficient documentation

## 2019-10-28 DIAGNOSIS — Z9884 Bariatric surgery status: Secondary | ICD-10-CM

## 2019-10-28 NOTE — Patient Instructions (Signed)
Remember the basics:   Focus on high protein foods  Focus on drinking plenty of fluids   Try to get in veggies and fruits throughout the day

## 2019-10-28 NOTE — Progress Notes (Signed)
Bariatric Follow-Up Visit Medical Nutrition Therapy   1 Year + 3 Months Post-Operative Sleeve Gastrectomy Surgery Surgery Date: 07/22/2018  MyChart Visit  This visit was completed via MyChart due to the COVID-19 pandemic.   I spoke with Cheri Guppy. Parker and verified that I was speaking with the correct person with two patient identifiers (full name and date of birth).   I discussed the limitations related to this kind of visit and the patient is willing to proceed.  Pt Goals/Expectations: to not be considered "obese" and to have a healthy body fat percentage; to have a better relationship with food    NUTRITION ASSESSMENT  Anthropometrics  Start weight at [facility]: 322.5lbs (04/30/2018)  Body Composition Scale 08/05/2018 09/18/2018 11/06/2018 01/20/2019 07/15/2019  Weight  lbs 300.2 278.2 263.2 248 238.6  BMI 52.3 48.5 45.7 43.2 41.3  Total Body Fat  % 55 53.5 45.8  43.8     Visceral Fat     13  Fat-Free Mass  % 45 46.5 54.2  56.1     Total Body Water  % 33.7 34.7 41.4  42.5     Muscle-Mass  lbs     32  Body Fat Displacement              Torso  lbs     64.8         Left Leg  lbs     12.9         Right Leg  lbs     12.9         Left Arm  lbs     6.4         Right Arm  lbs     6.4   Lifestyle & Dietary Hx Patient states that she continues to struggle with emotional eating. States she thinks that she is eating too frequently, and is consuming more carbohydrate containing foods. States she is not motivated to make changes, despite her success and progress previously made. States she continues to have coffee with protein shake as the cream in the mornings. May have keto bread with natural peanut butter and jelly for breakfast. States she has family situations going on which make it hard for her to have motivation to make better food choices.   Supplements: bariatric MVI, calcium (not taking daily/consistently)  Estimated Daily Fluid Intake: ~64 oz Estimated Daily Protein Intake: 50-60  g  Physical Activity  Current average weekly physical activity: limited dt knee injury   NUTRITION DIAGNOSIS  Overweight/obesity ([place]-3.3) related to past poor dietary habits and physical inactivity as evidenced by [name] following dietary guidelines for continued weight loss.   NUTRITION INTERVENTION Nutrition counseling (C-1) and education (E-2) to facilitate bariatric surgery goals.   Learning Style & Readiness for Change Teaching method utilized: Auditory  Demonstrated degree of understanding via: Teach Back  Barriers to learning/adherence to lifestyle change: Stress, Depression   MONITORING & EVALUATION Dietary intake, weekly physical activity, body weight, and goals at next nutrition visit.  Next Steps Patient is to follow-up in 1 month via MyChart for continued nutrition education.

## 2019-10-29 DIAGNOSIS — F4323 Adjustment disorder with mixed anxiety and depressed mood: Secondary | ICD-10-CM | POA: Diagnosis not present

## 2019-10-30 DIAGNOSIS — Z20828 Contact with and (suspected) exposure to other viral communicable diseases: Secondary | ICD-10-CM | POA: Diagnosis not present

## 2019-11-12 ENCOUNTER — Encounter: Payer: Self-pay | Admitting: Nurse Practitioner

## 2019-11-12 ENCOUNTER — Telehealth (INDEPENDENT_AMBULATORY_CARE_PROVIDER_SITE_OTHER): Payer: BC Managed Care – PPO | Admitting: Nurse Practitioner

## 2019-11-12 DIAGNOSIS — R21 Rash and other nonspecific skin eruption: Secondary | ICD-10-CM | POA: Diagnosis not present

## 2019-11-12 MED ORDER — CLOTRIMAZOLE-BETAMETHASONE 1-0.05 % EX CREA: 1.0000 "application " | TOPICAL_CREAM | Freq: Two times a day (BID) | CUTANEOUS | 1 refills | Status: DC

## 2019-11-12 NOTE — Progress Notes (Signed)
Virtual Visit via MyChart Note  I connected with  Michaela Holland on 11/12/19 at  9:30 AM EST by the video enabled telemedicine application, MyChart, and verified that I am speaking with the correct person using two identifiers.   I introduced myself as a Designer, jewellery with the practice. We discussed the limitations of evaluation and management by telemedicine and the availability of in person appointments. The patient expressed understanding and agreed to proceed.  The patient is: at her parents home I am: in the office  Subjective:    CC: Rash on right foot  HPI: Michaela Holland is a 39 y.o. y/o female presenting via [place] today for a dry, scaly rash on her right foot and ankle.  She reports approximately 8 to 9 months ago a single circular, dry, white, scaly lesion that the size of a quarter appeared on the lateral portion of her right foot.  She scratched the scales off and exposed pink, sensitive skin underneath.  She cover the area with lotion with no improvement.  She reports the area is now a patch that has spread up to the ankle exhibiting the same symptoms.  She has been applying lotions and oils meticulously to the area with very little relief.  She recently went to her parents house and tried her mother's fluocinonide that she uses for eczema/psoriasis.  She reports this helped better than anything at all but did not use the medication consistently.  She reports the area is occasionally itchy and is rough, cracked, scaly, irritated, and dry.  She does have a history of eczema however she does not feel this is exhibiting the same symptoms as she usually experiences.  She reports she typically keeps her feet uncovered and dry.  She denies any known interaction with fungus or irritants.  At this time she is unable to come in for an in person visit due to being in [place] with her parents.  She is helping her mother take care of her father who has terminal cancer and is nearing  end-of-life.  Past medical history, Surgical history, Family history not pertinant except as noted below, Social history, Allergies, and medications have been entered into the medical record, reviewed, and corrections made.   Review of Systems:  Denies fever, warmth to the rash, red streaking from the area of the rash, similar symptoms anywhere else on the body, oozing, pus.   Objective:    General: Speaking clearly in complete sentences without any shortness of breath.  Alert and oriented x3.  Normal judgment. No apparent acute distress. Erythematous plaque located on the anterior lateral portion of the right ankle and foot.  Margins appear to be well defined and irregular.  No visible exudate present.  Impression and Recommendations:    1. Rash in adult Rash to the right lateral foot and ankle.  Differential diagnoses include tinea pedis and eczema.  The patient has had some relief with topical steroid cream.   Prescription for Lotrisone provided with instructions to apply to the area twice a day.  Patient instructed to keep feet dry and open to air as much as possible.  If symptoms persist we will likely need to get a scraping of the area when she has returned to [place]. Patient instructed to notify me if symptoms persist after continuous use for 7 days.  A stronger steroid cream may be needed and I would like to reevaluate the area to ensure that infection is not present.  -  clotrimazole-betamethasone (LOTRISONE) cream; Apply 1 application topically 2 (two) times daily.  Dispense: 45 g; Refill: 1    I discussed the assessment and treatment plan with the patient. The patient was provided an opportunity to ask questions and all were answered. The patient agreed with the plan and demonstrated an understanding of the instructions.   The patient was advised to call back or seek an in-person evaluation if the symptoms worsen or if the condition fails to improve as anticipated.  I  provided 25 minutes of non-face-to-face interaction with this MYCHART visit.   Tollie Eth, NP

## 2019-11-12 NOTE — Patient Instructions (Signed)
My prayers are with you and your family during this time. Your dad sounds like an amazing man and his legacy will most certainly live on. - sending virtual hugs-  -SaraBeth

## 2019-11-25 ENCOUNTER — Encounter: Payer: BC Managed Care – PPO | Attending: Surgery | Admitting: Dietician

## 2019-11-25 ENCOUNTER — Encounter: Payer: Self-pay | Admitting: Osteopathic Medicine

## 2019-11-25 DIAGNOSIS — E669 Obesity, unspecified: Secondary | ICD-10-CM | POA: Insufficient documentation

## 2019-11-26 DIAGNOSIS — I471 Supraventricular tachycardia: Secondary | ICD-10-CM | POA: Diagnosis not present

## 2019-11-26 DIAGNOSIS — R000 Tachycardia, unspecified: Secondary | ICD-10-CM | POA: Diagnosis not present

## 2019-11-26 DIAGNOSIS — R42 Dizziness and giddiness: Secondary | ICD-10-CM | POA: Diagnosis not present

## 2019-12-08 ENCOUNTER — Telehealth: Payer: Self-pay | Admitting: *Deleted

## 2019-12-08 NOTE — Telephone Encounter (Signed)
Pt reports that she called earlier to get in for a virtual visit today.   She stated that she has been having palpitations and these are new and difficulty sleeping and when she is awake she feels dazed and confused and her anxiety has been elevated because of what's been going on. Her father has terminal cancer which has caused more stress.   She stated that she was able to handle this before but this has become more of a concern over the past week and has made her more worried.   Tried to schedule an appointment for her today and there weren't any openings. Offered her an appt w/pcp for tomorrow she stated that she will be on a flight tomorrow and cannot take the appt that was offered and will need a virtual. I attempted to schedule a virtual and she was still unable to give me a time that she would be able to be seen. I advised that she call first thing in the morning and try to do a virtual at that time or go to the ED or UC tonight. She voiced understanding an agreed. She was very appreciative of my help and thanked me for being so helpful.Heath Gold, CMA

## 2019-12-10 ENCOUNTER — Encounter: Payer: Self-pay | Admitting: Medical-Surgical

## 2019-12-10 ENCOUNTER — Other Ambulatory Visit: Payer: Self-pay

## 2019-12-10 ENCOUNTER — Telehealth (INDEPENDENT_AMBULATORY_CARE_PROVIDER_SITE_OTHER): Payer: BC Managed Care – PPO | Admitting: Medical-Surgical

## 2019-12-10 DIAGNOSIS — R002 Palpitations: Secondary | ICD-10-CM

## 2019-12-10 DIAGNOSIS — F5105 Insomnia due to other mental disorder: Secondary | ICD-10-CM | POA: Diagnosis not present

## 2019-12-10 DIAGNOSIS — F99 Mental disorder, not otherwise specified: Secondary | ICD-10-CM

## 2019-12-10 DIAGNOSIS — F419 Anxiety disorder, unspecified: Secondary | ICD-10-CM

## 2019-12-10 NOTE — Progress Notes (Signed)
Virtual Visit via Video Note  I connected with Michaela Holland on 12/10/19 at 11:10 AM EDT by a video enabled telemedicine application and verified that I am speaking with the correct person using two identifiers.   I discussed the limitations of evaluation and management by telemedicine and the availability of in person appointments. The patient expressed understanding and agreed to proceed.  Subjective:    CC: Anxiety, insomnia, palpitations  HPI: 39 year old female presenting via [facility] video visit to discuss insomnia, anxiety, and palpitations.    Palpitations-history of intermittent palpitations approximately once monthly that spontaneously resolve and are not accompanied by other symptoms.  Over the last 2 weeks palpitations have worsened.  She was medically evaluated in [place] approximately 2 weeks ago where they determined that she was experiencing intermittent SVT and attributed this to increase in anxiety/stress.  She reports an EKG and some blood work were done but is unsure of what lab tests were completed.  She reports that her thyroid function was normal at that time.  No palpitations in the last week but she endorses anxiety that they may return.  Anxiety-reports that she is helping take care of her terminally ill father.  Taking Zoloft 100 mg daily.  Previously taking hydroxyzine but this has not been helpful. Anxiety presenting with pressure in chest and overall anxious feelings where she states she does not feel anxious in her thoughts but her body does. In [place], she was provided with a prescription for Klonopin 0.5 mg twice daily as needed.  She is using this 1-2 times daily for anxiety.    She reports being on Klonopin in the past but quitting because she and her husband were trying to conceive.  Currently has a Social worker, marriage counseling as well as individual counseling.  Has not had a counseling appointment in a few months.  Insomnia-having difficulty sleeping  with increased anxiety.  Sleeping in brief 1 to 2-hour bursts for the last several days until last night when she was able to sleep for 5 to 6 hours.  Has difficulty falling asleep and staying asleep.  Previously on melatonin but stopped that as she was trying to conceive.  Also previously tried trazodone which did help her sleep but left her feeling hung over in the morning.  Reports that she feels sleep deprived and when she is awake she feels dazed.  Past medical history, Surgical history, Family history not pertinant except as noted below, Social history, Allergies, and medications have been entered into the medical record, reviewed, and corrections made.   Review of Systems: No fevers, chills, night sweats, weight loss, chest pain, or shortness of breath.   Objective:    General: Speaking clearly in complete sentences without any shortness of breath.  Alert and oriented x3.  Normal judgment. No apparent acute distress.  Impression and Recommendations:    1. Anxiety Continue Zoloft 100 mg daily.  Continue sparing use of Klonopin 0.5 mg as prescribed.  Strongly encouraged contacting counselor to discuss anticipatory grief and stress reaction.  2. Insomnia due to other mental disorder Worsening of insomnia likely related to anticipatory grief, increased anxiety, stress reaction. Recommend melatonin as needed.  If this is not effective can try trazodone 25 mg instead of the full dose of 50 mg to see if this improves her overall hangover feeling the next day.   3. Palpitations With normal thyroid levels and blood work in [place], strongly suspect this is related to stress reaction and increased  anxiety.  Continue to monitor for palpitations and if any other symptoms such as shortness of breath, chest pain, nausea/vomiting, or diaphoresis begins to accompany palpitations, seek emergency evaluation.  Work note requested for absences on 12/09/2019 and 12/10/2019.  Letter provided and sent via  MyChart.  Return if symptoms worsen or fail to improve.   35 minutes of non-face-to-face time was provided during this encounter.  I discussed the assessment and treatment plan with the patient. The patient was provided an opportunity to ask questions and all were answered. The patient agreed with the plan and demonstrated an understanding of the instructions.   The patient was advised to call back or seek an in-person evaluation if the symptoms worsen or if the condition fails to improve as anticipated.  Thayer Ohm, DNP, APRN, FNP-BC [facility]

## 2019-12-14 ENCOUNTER — Ambulatory Visit: Payer: BLUE CROSS/BLUE SHIELD | Admitting: Orthopaedic Surgery

## 2020-01-02 DIAGNOSIS — F900 Attention-deficit hyperactivity disorder, predominantly inattentive type: Secondary | ICD-10-CM | POA: Diagnosis not present

## 2020-01-13 ENCOUNTER — Ambulatory Visit (INDEPENDENT_AMBULATORY_CARE_PROVIDER_SITE_OTHER)
Admission: RE | Admit: 2020-01-13 | Discharge: 2020-01-13 | Disposition: A | Discharge status: Home / Self Care | Payer: BC Managed Care – PPO | Source: Ambulatory Visit

## 2020-01-13 DIAGNOSIS — G8929 Other chronic pain: Secondary | ICD-10-CM | POA: Diagnosis not present

## 2020-01-13 DIAGNOSIS — M25561 Pain in right knee: Secondary | ICD-10-CM

## 2020-01-13 DIAGNOSIS — M25562 Pain in left knee: Secondary | ICD-10-CM | POA: Diagnosis not present

## 2020-01-13 DIAGNOSIS — M25571 Pain in right ankle and joints of right foot: Secondary | ICD-10-CM

## 2020-01-13 MED ORDER — MELOXICAM 7.5 MG PO TABS: 7.5000 mg | ORAL_TABLET | Freq: Every day | ORAL | 0 refills | Status: DC

## 2020-01-13 NOTE — Discharge Instructions (Signed)
Start Mobic. Do not take ibuprofen (motrin/advil)/ naproxen (aleve) while on mobic. Ice compress, rest, ace wrap during activity. Follow up with PCP/orthopedics if symptoms not improving.  °

## 2020-01-13 NOTE — ED Provider Notes (Signed)
Virtual Visit via Video Note:  Michaela Holland  initiated request for Telemedicine visit with [facility] team. I connected with Michaela Holland  on 01/13/2020 at 6:38 PM  for a synchronized telemedicine visit using a video enabled HIPPA compliant telemedicine application. I verified that I am speaking with Michaela Holland  using two identifiers. Michaela Cannedy Jodell Cipro, PA-C  was physically located in a [facility]gent care site and Michaela Holland was located at a different location.   The limitations of evaluation and management by telemedicine as well as the availability of in-person appointments were discussed. Patient was informed that she  may incur a bill ( including co-pay) for this virtual visit encounter. Michaela Holland  expressed understanding and gave verbal consent to proceed with virtual visit.     History of Present Illness:Michaela Holland  is a 39 y.o. female with history of OA of bilateral knee presents with 2 week history of bilateral ankle pain; exacerbation of bilateral knee pain; Intermittent itching/rash to the foot. Denies injury/trauma. Denies swelling, erythema, warmth. Pain is to the medial ankle. 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 Takes advil for symptoms.    Past Medical History:  Diagnosis Date  . Anxiety   . Back pain   . Chronic pain of left knee   . Depression   . Diabetes mellitus without complication ([place])    denies this diagnosis   . Family history of adverse reaction to anesthesia    per patient ; father was having abdominal surgery and had to be placed on medicine to keep his blood pressure up; he never had any issues with low BP before the surgery "   . Gallbladder problem   . GERD (gastroesophageal reflux disease)   . Gout    believes only occurred once in feet;   . Hypertension    denies this diagnosis   . Hypothyroidism   . Joint pain   . Obesity   . Osteoarthritis   . Palpitations   . PCOS (polycystic ovarian syndrome)   .  PONV (postoperative nausea and vomiting)    with c section; no issues with followwing procedures   . Seasonal allergies   . Seizures ([place])    has had "pre-seizure" activity in the brain but deneis any actual seizures   . Sleep apnea    does not currently use due to insurance   . Vitamin D deficiency     Allergies  Allergen Reactions  . Sulfa Antibiotics Hives and Shortness Of Breath  . Other     Nuts severe reaction difficulty breathing , throat swelling; mostly tree nuts but not all tree nuts   . Adhesive [Tape] Rash        Observations/Objective: General: Well appearing, nontoxic, no acute distress. Sitting comfortably. Head: Normocephalic, atraumatic Eye: No conjunctival injection, eyelid swelling. EOMI ENT: Mucus membranes moist, no lip cracking. No obvious nasal drainage. Pulm: Speaking in full sentences without difficulty. Normal effort. No respiratory distress, accessory muscle use. MSK: no swelling, erythema, warmth of the ankle. Patient able to ambulate on own, no obvious gait changes. Skin: no current rash to the foot Neuro: Normal mental status. Alert and oriented x 3.   Assessment and Plan: Will provide short course of mobic due history of gastric sleeve. Ice compress, elevation, ace wrap during activity. No current rash to the foot. Will have patient follow up with PCP for further evaluation if  symptoms not improving.  Follow Up Instructions:    I discussed the assessment and treatment plan with the patient. The patient was provided an opportunity to ask questions and all were answered. The patient agreed with the plan and demonstrated an understanding of the instructions.   The patient was advised to call back or seek an in-person evaluation if the symptoms worsen or if the condition fails to improve as anticipated.  I provided 20 minutes of non-face-to-face time during this encounter.    Michaela Fisher, PA-C  01/13/2020 6:38 PM         Michaela Fisher,  PA-C 01/13/20 2000

## 2020-02-12 ENCOUNTER — Encounter (HOSPITAL_COMMUNITY): Payer: Self-pay

## 2020-02-18 ENCOUNTER — Ambulatory Visit (INDEPENDENT_AMBULATORY_CARE_PROVIDER_SITE_OTHER): Payer: BC Managed Care – PPO | Admitting: Psychologist

## 2020-02-18 DIAGNOSIS — Z634 Disappearance and death of family member: Secondary | ICD-10-CM | POA: Diagnosis not present

## 2020-02-18 DIAGNOSIS — F330 Major depressive disorder, recurrent, mild: Secondary | ICD-10-CM

## 2020-02-18 DIAGNOSIS — E669 Obesity, unspecified: Secondary | ICD-10-CM

## 2020-02-22 ENCOUNTER — Ambulatory Visit (INDEPENDENT_AMBULATORY_CARE_PROVIDER_SITE_OTHER): Payer: BC Managed Care – PPO | Admitting: Psychologist

## 2020-02-22 DIAGNOSIS — E669 Obesity, unspecified: Secondary | ICD-10-CM | POA: Diagnosis not present

## 2020-02-22 DIAGNOSIS — F330 Major depressive disorder, recurrent, mild: Secondary | ICD-10-CM

## 2020-02-22 DIAGNOSIS — Z634 Disappearance and death of family member: Secondary | ICD-10-CM

## 2020-02-26 ENCOUNTER — Encounter: Payer: Self-pay | Admitting: Family Medicine

## 2020-02-26 ENCOUNTER — Other Ambulatory Visit: Payer: Self-pay | Admitting: Family Medicine

## 2020-03-02 ENCOUNTER — Ambulatory Visit (INDEPENDENT_AMBULATORY_CARE_PROVIDER_SITE_OTHER): Payer: BC Managed Care – PPO | Admitting: Family Medicine

## 2020-03-02 ENCOUNTER — Ambulatory Visit: Payer: Self-pay

## 2020-03-02 ENCOUNTER — Encounter: Payer: Self-pay | Admitting: Family Medicine

## 2020-03-02 ENCOUNTER — Other Ambulatory Visit: Payer: Self-pay

## 2020-03-02 ENCOUNTER — Ambulatory Visit (INDEPENDENT_AMBULATORY_CARE_PROVIDER_SITE_OTHER): Payer: BC Managed Care – PPO

## 2020-03-02 VITALS — BP 130/82 | HR 75 | Ht 63.5 in | Wt 273.4 lb

## 2020-03-02 DIAGNOSIS — M25561 Pain in right knee: Secondary | ICD-10-CM | POA: Diagnosis not present

## 2020-03-02 DIAGNOSIS — M170 Bilateral primary osteoarthritis of knee: Secondary | ICD-10-CM | POA: Diagnosis not present

## 2020-03-02 DIAGNOSIS — M25562 Pain in left knee: Secondary | ICD-10-CM

## 2020-03-02 DIAGNOSIS — M25572 Pain in left ankle and joints of left foot: Secondary | ICD-10-CM

## 2020-03-02 DIAGNOSIS — G8929 Other chronic pain: Secondary | ICD-10-CM

## 2020-03-02 MED ORDER — PENNSAID 2 % EX SOLN: 1.0000 "application " | Freq: Two times a day (BID) | CUTANEOUS | 2 refills | Status: DC

## 2020-03-02 NOTE — Patient Instructions (Addendum)
Thank you for coming in today. Plan for Pennsaid Ok to also use voltaren gel.   Weight loss will help a lot.  Continue to work on weight loss.   Recheck as needed for injection.   Instructions for Duexis, Pennsaid and Vimovo:  Your prescription will be filled through a participating [facility] mail order pharmacy.  You will receive a phone call or text from one of the participating pharmacies which can be located in any state in the [place].  You must communicate directly with them to have this medication filled.  When the pharmacy contacts you, they will need your mailing address (for shipment of the medication) andy they will need payment information if you have a copay (typically no more than $10). If you have not heard from them 2-3 days after your appointment with Dr. Denyse Amass, contact [facility] directly at [phone].  Pennsaid instructions: You have been given a sample/prescription for Pennsaid, a topical medication.     You are to apply this gel to your injured body part twice daily (morning and evening).   A little goes a long way so you can use about a pea-sized amount for each area.   Spread this small amount over the area into a thin film and let it dry.   Be sure that you do not rub the gel into your skin for more than 10 or 15 seconds otherwise it can irritate you skin.    Once you apply the gel, please do not put any other lotion or clothing in contact with that area for 30 minutes to allow the gel to absorb into your skin.   Some people are sensitive to the medication and can develop a sunburn-like rash.  If you have only mild symptoms it is okay to continue to use the medication but if you have any breakdown of your skin you should discontinue its use and please let us know.   If you have been written a prescription for Pennsaid, you will receive a pump bottle of this topical gel through a mail order pharmacy.  The instructions on the bottle will say to  apply two pumps twice a day which may be too much gel for your particular area so use the pea-sized amount as your guide.

## 2020-03-02 NOTE — Progress Notes (Signed)
I, Michaela Holland, LAT, ATC, am serving as scribe for Dr. Clementeen Holland.  Michaela Holland is a 39 y.o. female who presents to [facility] at [facility] today for f/u of B knee pain and L ankle pain.  She was last seen for these issues via a [facility] video visit and was prescribed Meloxicam.  She has a hx of chronic knee pain.  Her L ankle pain began about 2 months ago w/ no known MOI.  She has chronic knee pain that has worsened over the past 2 months.  B knee pain: She locates her pain to her entire knee but location will change sporadically.  She had a prior L knee arthroscopic surgery in 2018.  Aggravating factors include weight bearing.  L ankle: She locates her pain to her R medial ankle.  She notes swelling along her L medial malleolus.  Aggravating factors include prolonged positioning whether sitting or standing/walking.  Diagnostic imaging: L knee XR- 05/11/19, R knee XR- 02/14/18; L knee MRI- 02/17/18, 05/04/17   Pertinent review of systems: No fevers or chills  Relevant historical information: History of bariatric surgery.   Exam:  BP 130/82 (BP Location: Right Arm, Patient Position: Sitting, Cuff Size: Large)   Pulse 75   Ht 5' 3.5" (1.613 m)   Wt 273 lb 6.4 oz (124 kg)   LMP 02/08/2020   SpO2 98%   BMI 47.67 kg/m  General: Well Developed, well nourished, and in no acute distress.   MSK:  Right knee: Small effusion otherwise normal-appearing Range of motion 0-120 degrees with mild crepitation. Mildly tender palpation medial joint line. Stable ligamentous exam. Negative Murray's test.  Left knee: Small effusion otherwise normal. Full range of motion with crepitation. Tender palpation medial joint line. Stable ligamentous exam. Positive medial McMurray's test.  Left ankle: Slightly swollen medial aspect of ankle otherwise normal-appearing Mildly tender palpation at medial malleolus. Stable given his exam. Intact strength.    Lab and  Radiology Results  X-ray images AP bilateral knees and left ankle obtained today personally and independently reviewed  Left knee: Moderate to severe medial compartment DJD. Right knee: Mild medial and lateral  DJD.  Left ankle: Minimal degenerative changes.  No acute fractures.  Await formal radiology review  Diagnostic Limited MSK Ultrasound of: Right knee Quad tendon intact normal-appearing Patellar tendon normal-appearing Medial joint line mildly narrowed mildly degenerative.  Medial meniscus. Lateral joint line mildly narrowed mildly degenerative.  Lateral meniscus. Posterior knee no Baker's cyst. Impression: Mild DJD  Diagnostic Limited MSK Ultrasound of: Left knee Quad tendon intact normal-appearing Patellar tendon normal-appearing Medial joint line very narrowed degenerative with partially absent or diminished medial meniscus. Lateral joint line largely normal-appearing Posterior knee no Baker's cyst Impression: More significant medial compartment DJD  Diagnostic Limited MSK Ultrasound of: Left ankle Medial ankle normal-appearing posterior tibialis tendon.  Small effusion present at anterior medial ankle. Impression: Degenerative changes anterior medial ankle    Assessment and Plan: 39 y.o. female with  Bilateral knee pain left worse than right.  Degenerative changes present on x-ray and ultrasound.  Patient has had some weight gain recently as she was caring for her dying father.  She is now back at home and able to do more care for herself.  We discussed management strategies in the short-term and long-term.  No long-term weight loss and quad strengthening will be helpful and she is already starting this now.  Recommend trial of Pennsaid and Voltaren gel.  Offered steroid injection.  She like to think about it and will return if needed for steroid injections. Gel shots may be helpful in the future but would favor steroid injections first.  Left ankle pain again more  degenerative changes.  Treatment is largely the same with topical NSAIDs and weight loss and potential for steroid injections in the future.   PDMP not reviewed this encounter. Orders Placed This Encounter  Procedures  . [place] LIMITED JOINT SPACE STRUCTURES LOW BILAT(NO LINKED CHARGES)    Order Specific Question:   Reason for Exam (SYMPTOM  OR DIAGNOSIS REQUIRED)    Answer:   B knee pain    Order Specific Question:   Preferred imaging location?    Answer:   [place]  . DG Knee Bilateral Standing AP    Standing Status:   Future    Number of Occurrences:   1    Standing Expiration Date:   03/02/2021    Order Specific Question:   Reason for Exam (SYMPTOM  OR DIAGNOSIS REQUIRED)    Answer:   eval BL knee pain    Order Specific Question:   Is patient pregnant?    Answer:   No    Order Specific Question:   Preferred imaging location?    Answer:   [facility]    Order Specific Question:   Radiology Contrast Protocol - do NOT remove file path    Answer:   \\[email or web address].[email or web address]  . DG Ankle Complete Left    Standing Status:   Future    Number of Occurrences:   1    Standing Expiration Date:   03/02/2021    Order Specific Question:   Reason for Exam (SYMPTOM  OR DIAGNOSIS REQUIRED)    Answer:   eval left ankle pain    Order Specific Question:   Is patient pregnant?    Answer:   No    Order Specific Question:   Preferred imaging location?    Answer:   [facility]    Order Specific Question:   Radiology Contrast Protocol - do NOT remove file path    Answer:   \\[email or web address].[email or web address]   Meds ordered this encounter  Medications  . Diclofenac Sodium (PENNSAID) 2 % SOLN    Sig: Place 1 application onto the skin 2 (two) times daily.    Dispense:  112 g    Refill:  2    Home Phone      [phone] Mobile          [phone]      Discussed warning signs or symptoms. Please see  discharge instructions. Patient expresses understanding.   The above documentation has been reviewed and is accurate and complete Lynne Leader, M.D.

## 2020-03-03 ENCOUNTER — Encounter: Payer: Self-pay | Admitting: Family Medicine

## 2020-03-03 ENCOUNTER — Ambulatory Visit (INDEPENDENT_AMBULATORY_CARE_PROVIDER_SITE_OTHER): Payer: BLUE CROSS/BLUE SHIELD | Admitting: Psychologist

## 2020-03-03 DIAGNOSIS — Z634 Disappearance and death of family member: Secondary | ICD-10-CM

## 2020-03-03 DIAGNOSIS — E669 Obesity, unspecified: Secondary | ICD-10-CM | POA: Diagnosis not present

## 2020-03-03 DIAGNOSIS — F330 Major depressive disorder, recurrent, mild: Secondary | ICD-10-CM | POA: Diagnosis not present

## 2020-03-04 NOTE — Progress Notes (Signed)
X-ray bilateral knees show mild arthritis of the right knee and medium arthritis left knee

## 2020-03-04 NOTE — Progress Notes (Signed)
X-ray left ankle shows a heel spur.  No acute fractures or significant arthritis.

## 2020-03-08 ENCOUNTER — Ambulatory Visit (INDEPENDENT_AMBULATORY_CARE_PROVIDER_SITE_OTHER): Payer: BLUE CROSS/BLUE SHIELD | Admitting: Nurse Practitioner

## 2020-03-08 ENCOUNTER — Encounter: Payer: Self-pay | Admitting: Nurse Practitioner

## 2020-03-08 ENCOUNTER — Other Ambulatory Visit: Payer: Self-pay

## 2020-03-08 VITALS — BP 95/60 | HR 82 | Temp 98.3°F | Ht 63.5 in | Wt 273.1 lb

## 2020-03-08 DIAGNOSIS — F419 Anxiety disorder, unspecified: Secondary | ICD-10-CM | POA: Diagnosis not present

## 2020-03-08 DIAGNOSIS — J302 Other seasonal allergic rhinitis: Secondary | ICD-10-CM | POA: Diagnosis not present

## 2020-03-08 DIAGNOSIS — H6593 Unspecified nonsuppurative otitis media, bilateral: Secondary | ICD-10-CM | POA: Diagnosis not present

## 2020-03-08 MED ORDER — CETIRIZINE HCL 10 MG PO TABS: 10.0000 mg | ORAL_TABLET | Freq: Two times a day (BID) | ORAL | 11 refills | Status: AC

## 2020-03-08 MED ORDER — TRIAMCINOLONE ACETONIDE 55 MCG/ACT NA AERO: 1.0000 | INHALATION_SPRAY | Freq: Every day | NASAL | 12 refills | Status: AC

## 2020-03-08 NOTE — Progress Notes (Signed)
Acute Office Visit  Subjective:    Patient ID: Michaela Holland, female    DOB: [birth year], 39 y.o.   MRN: [id number]  Chief Complaint  Patient presents with  . Otalgia    onset:1 month, decreased hearing, feels like she is under water, pain is intermittent    HPI Patient is in today for ear pressure, fullness, intermittent pain, and decreased hearing that has been going on for about the past month. She has been out of state helping to care for her father on and off for the past several months. He recently passed away and she [place]. At that time she and her family became ill with a viral upper respiratory illness. She reports her symptoms were fairly mild, but since that time she has been experiencing symptoms in her ears. She states that her husband noted that she is not hearing well and that prompted her to seek medical attention.   She denies sinus pain/pressure, cough, shortness of breath, fever, chills, rhinorrhea, sore throat, or chest pain.  She endorses intermittent dizziness with quick head movements, ear fullness, ear pressure, decreased hearing, and intermittent ear pain.   She also reports increased forgetfulness and cloudy thoughts lately. She has been under a tremendous amount of stress over the past year helping to care for her father who was dying, being out of state for extended periods from her family, and coping with the loss of her father. She often feels overwhelmed with returning home and all the things that she feels need to be done that she could not do while she was away. She has had a recent change to her medications by her psychiatrist, which she reports have helped with her daily anxiety symptoms, but she is still experiencing mental fog and forgetfulness. She is frustrated by this and was concerned that it could be a complication from what is happening in her ears.   Past Medical History:  Diagnosis Date  . Anxiety   . Back pain   . Chronic  pain of left knee   . Depression   . Diabetes mellitus without complication (HCC)    denies this diagnosis   . Family history of adverse reaction to anesthesia    per patient ; father was having abdominal surgery and had to be placed on medicine to keep his blood pressure up; he never had any issues with low BP before the surgery "   . Gallbladder problem   . GERD (gastroesophageal reflux disease)   . Gout    believes only occurred once in feet;   . Hypertension    denies this diagnosis   . Hypothyroidism   . Joint pain   . Obesity   . Osteoarthritis   . Palpitations   . PCOS (polycystic ovarian syndrome)   . PONV (postoperative nausea and vomiting)    with c section; no issues with followwing procedures   . Seasonal allergies   . Seizures (HCC)    has had "pre-seizure" activity in the brain but deneis any actual seizures   . Sleep apnea    does not currently use due to insurance   . Vitamin D deficiency     Past Surgical History:  Procedure Laterality Date  . CESAREAN SECTION    . CHOLECYSTECTOMY    . ESOPHAGOGASTRODUODENOSCOPY  2001 and 2018  . GANGLION CYST EXCISION Left 2014   foot  . KNEE ARTHROSCOPY Left 06/28/2017   Procedure: LEFT KNEE ARTHROSCOPY with debridement;  Surgeon: Kathryne Hitch, MD;  Location: [facility];  Service: Orthopedics;  Laterality: Left;  . LAPAROSCOPIC GASTRIC SLEEVE RESECTION N/A 07/22/2018   Procedure: LAPAROSCOPIC GASTRIC SLEEVE RESECTION WITH UPPER ENDO AND ERAS PATHWAY;  Surgeon: Luretha Murphy, MD;  Location: [facility];  Service: General;  Laterality: N/A;    Family History  Problem Relation Age of Onset  . Polycystic ovary syndrome Mother   . Hypothyroidism Mother   . Hyperlipidemia Mother   . Depression Mother   . Obesity Mother   . Polycystic ovary syndrome Sister   . Hypothyroidism Sister   . Hypertension Father   . Hyperlipidemia Father   . Cancer Father   . Depression Father   . Anxiety disorder Father   . Obesity  Father   . Asthma Other     Social History   Socioeconomic History  . Marital status: Married    Spouse name: Ree Kida Leclere  . Number of children: 1  . Years of education: Not on file  . Highest education level: Not on file  Occupational History  . Occupation: Designer, jewellery  Tobacco Use  . Smoking status: Never Smoker  . Smokeless tobacco: Never Used  Vaping Use  . Vaping Use: Never used  Substance and Sexual Activity  . Alcohol use: Never  . Drug use: No  . Sexual activity: Yes    Partners: Male    Birth control/protection: None  Other Topics Concern  . Not on file  Social History Narrative  . Not on file   Social Determinants of Health   Financial Resource Strain:   . Difficulty of Paying Living Expenses:   Food Insecurity:   . Worried About Programme researcher, broadcasting/film/video in the Last Year:   . Barista in the Last Year:   Transportation Needs:   . Freight forwarder (Medical):   Marland Kitchen Lack of Transportation (Non-Medical):   Physical Activity:   . Days of Exercise per Week:   . Minutes of Exercise per Session:   Stress:   . Feeling of Stress :   Social Connections:   . Frequency of Communication with Friends and Family:   . Frequency of Social Gatherings with Friends and Family:   . Attends Religious Services:   . Active Member of Clubs or Organizations:   . Attends Banker Meetings:   Marland Kitchen Marital Status:   Intimate Partner Violence:   . Fear of Current or Ex-Partner:   . Emotionally Abused:   Marland Kitchen Physically Abused:   . Sexually Abused:     Outpatient Medications Prior to Visit  Medication Sig Dispense Refill  . albuterol (PROVENTIL HFA;VENTOLIN HFA) 108 (90 Base) MCG/ACT inhaler Inhale 1-2 puffs into the lungs every 4 (four) hours as needed for wheezing or shortness of breath (bronchospasm). 1 Inhaler 0  . busPIRone (BUSPAR) 15 MG tablet Take 15 mg by mouth 2 (two) times daily.    . calcium-vitamin D (OSCAL WITH D) 500-200 MG-UNIT tablet Take 1  tablet by mouth 3 (three) times daily.    . clonazePAM (KLONOPIN) 0.5 MG tablet Take 0.5-1 tablets (0.25-0.5 mg total) by mouth 2 (two) times daily as needed for anxiety (#30 for 90 days.). 30 tablet 0  . clotrimazole-betamethasone (LOTRISONE) cream Apply 1 application topically 2 (two) times daily. 45 g 1  . cyclobenzaprine (FLEXERIL) 10 MG tablet Take 1 tablet (10 mg total) by mouth 3 (three) times daily as needed for muscle spasms. WILL CAUSE DROWSINESS. 60 tablet  2  . Diclofenac Sodium (PENNSAID) 2 % SOLN Place 1 application onto the skin 2 (two) times daily. 112 g 2  . EPINEPHrine 0.3 mg/0.3 mL IJ SOAJ injection Inject 0.3 mg into the muscle daily as needed (for anaphylactic reactions.).     Marland Kitchen ferrous sulfate 325 (65 FE) MG tablet Take 1 tablet by mouth daily.    Marland Kitchen gabapentin (NEURONTIN) 100 MG capsule Take 1-3 capsules (100-300 mg total) by mouth 3 (three) times daily as needed. 90 capsule 0  . gabapentin (NEURONTIN) 300 MG capsule Take 1 capsule (300 mg total) by mouth 3 (three) times daily. 180 capsule 1  . ibuprofen (ADVIL) 200 MG tablet Take 200 mg by mouth as needed.    Marland Kitchen levothyroxine (SYNTHROID) 175 MCG tablet TAKE 1 TABLET BY MOUTH ONCE DAILY BEFORE  BREAKFAST 90 tablet 2  . meloxicam (MOBIC) 7.5 MG tablet Take 1 tablet (7.5 mg total) by mouth daily. 10 tablet 0  . metFORMIN (GLUCOPHAGE-XR) 500 MG 24 hr tablet Take 1 tablet (500 mg total) by mouth at bedtime. 90 tablet 3  . Multiple Vitamins-Minerals (BARIATRIC FUSION) CHEW Chew 1 tablet by mouth 2 (two) times daily.    Marland Kitchen nystatin (MYCOSTATIN/NYSTOP) powder Apply topically 4 (four) times daily. As needed for fungal skin infection 45 g 1  . ondansetron (ZOFRAN-ODT) 4 MG disintegrating tablet Take 4 mg by mouth 3 (three) times daily as needed for nausea/vomiting.  0  . pantoprazole (PROTONIX) 40 MG tablet Take 40 mg by mouth daily.  2  . Probiotic Product (ACIDOPHILUS/GOAT MILK) CAPS Take 1 tablet by mouth daily as needed.    .  sertraline (ZOLOFT) 100 MG tablet Take 2 tablets by mouth daily.    . Turmeric 500 MG CAPS Take 1,000 mg by mouth at bedtime.    Burr Medico 150-35 MCG/24HR transdermal patch APPLY 1 PATCH ONCE WEEKLY FOR 3 WEEKS. OFF FOR 1 WEEK. REPEAT MONTHLY 9 patch 3   No facility-administered medications prior to visit.    Allergies  Allergen Reactions  . Sulfa Antibiotics Hives and Shortness Of Breath  . Other     Nuts severe reaction difficulty breathing , throat swelling; mostly tree nuts but not all tree nuts   . Adhesive [Tape] Rash      Objective:    Physical Exam Vitals and nursing note reviewed.  Constitutional:      Appearance: Normal appearance.  HENT:     Head: Normocephalic.     Right Ear: Decreased hearing noted. No drainage, swelling or tenderness. A middle ear effusion is present. No mastoid tenderness. Tympanic membrane is retracted.     Left Ear: Decreased hearing noted. No drainage, swelling or tenderness. A middle ear effusion is present. No mastoid tenderness. Tympanic membrane is retracted.     Nose: Mucosal edema present. No congestion or rhinorrhea.     Right Turbinates: Swollen.     Left Turbinates: Swollen.     Right Sinus: No maxillary sinus tenderness or frontal sinus tenderness.     Left Sinus: No maxillary sinus tenderness or frontal sinus tenderness.     Mouth/Throat:     Mouth: Mucous membranes are moist.     Pharynx: Oropharynx is clear. No oropharyngeal exudate or posterior oropharyngeal erythema.  Eyes:     Extraocular Movements: Extraocular movements intact.     Conjunctiva/sclera: Conjunctivae normal.     Pupils: Pupils are equal, round, and reactive to light.  Cardiovascular:     Rate and Rhythm: Normal rate  and regular rhythm.     Pulses: Normal pulses.     Heart sounds: Normal heart sounds.  Pulmonary:     Effort: Pulmonary effort is normal.     Breath sounds: Normal breath sounds.  Abdominal:     General: Abdomen is flat. Bowel sounds are normal.      Palpations: Abdomen is soft.  Musculoskeletal:        General: Normal range of motion.     Cervical back: Normal range of motion. No tenderness.     Right lower leg: No edema.     Left lower leg: No edema.  Lymphadenopathy:     Cervical: Cervical adenopathy present.  Skin:    General: Skin is warm and dry.     Capillary Refill: Capillary refill takes less than 2 seconds.  Neurological:     General: No focal deficit present.     Mental Status: She is alert and oriented to person, place, and time.  Psychiatric:        Mood and Affect: Mood normal.        Behavior: Behavior normal.        Thought Content: Thought content normal.        Judgment: Judgment normal.     BP 95/60   Pulse 82   Temp 98.3 F (36.8 C) (Oral)   Ht 5' 3.5" (1.613 m)   Wt 273 lb 1.6 oz (123.9 kg)   LMP 02/08/2020   SpO2 97%   BMI 47.62 kg/m  Wt Readings from Last 3 Encounters:  03/08/20 273 lb 1.6 oz (123.9 kg)  03/02/20 273 lb 6.4 oz (124 kg)  07/15/19 263 lb 3.2 oz (119.4 kg)    There are no preventive care reminders to display for this patient.  There are no preventive care reminders to display for this patient.   Lab Results  Component Value Date   TSH 0.53 07/17/2019   Lab Results  Component Value Date   WBC 6.8 07/17/2019   HGB 13.0 07/17/2019   HCT 39.7 07/17/2019   MCV 87.6 07/17/2019   PLT 332 07/17/2019   Lab Results  Component Value Date   NA 140 07/17/2019   K 4.7 07/17/2019   CO2 29 07/17/2019   GLUCOSE 89 07/17/2019   BUN 15 07/17/2019   CREATININE 0.73 07/17/2019   BILITOT 0.5 07/17/2019   ALKPHOS 50 07/15/2018   AST 15 07/17/2019   ALT 12 07/17/2019   PROT 6.7 07/17/2019   ALBUMIN 4.1 07/15/2018   CALCIUM 9.2 07/17/2019   ANIONGAP 9 07/15/2018   GFR 138.63 07/11/2017   Lab Results  Component Value Date   CHOL 187 02/11/2019   Lab Results  Component Value Date   HDL 54 02/11/2019   Lab Results  Component Value Date   LDLCALC 108 (H) 02/11/2019    Lab Results  Component Value Date   TRIG 140 02/11/2019   Lab Results  Component Value Date   CHOLHDL 3.5 02/11/2019   Lab Results  Component Value Date   HGBA1C 4.9 10/22/2017       Assessment & Plan:   1. Middle ear effusion, bilateral Symptoms and presentation consistent with middle ear effusion, without evidence of infection, most likely related to recent upper respiratory illness and/or seasonal allergy symptoms. TM are retracted bilaterally. Discussed that these symptoms and linger for up to three months after viral or allergy related upper respiratory conditions. Discussed increasing cetirizine dosage to 10mg  twice a day and adding  steroid nasal spray to her daily regimen. She has been unable to tolerate fluticasone nasal spray in the past due to increased nasal tenderness and burning. Will trial triamcinolone nasal spray to see if this is more effective for her without the discomfort. Also encouraged avoiding allergen triggers, warm compresses to the external ear, and gentle massage to the neck in the area of the eustachian tube. Trying to avoid the use of oral steroids due to risk of increased agitation, anxiety, sleep disturbance, and weight gain. If conservative measures are ineffective after two weeks, re-evaluation may be necessary to ensure that antibiotic treatment is not required.   PLAN: Conservative treatment with increased dose of oral allergy medication and addition of intranasal steroid daily for 2 weeks If treatment is ineffective, may need ENT referral or more aggressive treatment  2. Seasonal allergic rhinitis, unspecified trigger Seasonal allergies with upper respiratory symptom exacerbation. Increase cetirizine to twice a day dosing and add intranasal triamcinolone spray to help with symptom management.   - triamcinolone (NASACORT) 55 MCG/ACT AERO nasal inhaler; Place 1 spray into the nose daily. I spray in each nostril  Dispense: 16.9 mL; Refill: 12 -  cetirizine (ZYRTEC) 10 MG tablet; Take 1 tablet (10 mg total) by mouth 2 (two) times daily.  Dispense: 60 tablet; Refill: 11  3. Anxiety Anxiety exacerbation due to recent illness and subsequent loss of father coupled with stress of travel, being away from family, and returning to normal duties at home. I strongly feel the increased mental fog and forgetfulness are related to her stress and anxiety levels. She was recently started on daily buspirone, which has been helpful. She is seeking counseling and under the care of a psychiatrist, as well. We discussed taking things one day at a time and allowing herself time to readjust back into her life. Many of her feelings are consistent with grief and being overwhelmed. If her mental fog and forgetfulness continue once she feels her anxiety is better controlled, she may need further evaluation of her symptoms. We discussed taking time for herself and focusing on small aspects instead of the big picture to help with coping. She may have a component of ADHD based on her difficulty focusing and concentrating on single tasks, although, this may be exacerbated with her increased anxiety. Encouraged her to discuss these issues with her counselor and psychiatrist. Will be happy to assist in any way we can to help her get through this difficult time.   Follow-up if symptoms are not improved in the next 2 weeks.    Tollie Eth, NP

## 2020-03-08 NOTE — Patient Instructions (Addendum)
You can make an over the counter ear drop for swimmers ear by using a 1:1 ratio of rubbing alcohol and white vinegar.    Use moist heat to the outside of your ear and your neck to help with the inflammation in the eustachian tube.   Earache, Adult An earache, or ear pain, can be caused by many things, including:  An infection.  Ear wax buildup.  Ear pressure.  Something in the ear that should not be there (foreign body).  A sore throat.  Tooth problems.  Jaw problems. Treatment of the earache will depend on the cause. If the cause is not clear or cannot be determined, you may need to watch your symptoms until your earache goes away or until a cause is found. Follow these instructions at home: Medicines  Take or apply over-the-counter and prescription medicines only as told by your health care provider.  If you were prescribed an antibiotic medicine, use it as told by your health care provider. Do not stop using the antibiotic even if you start to feel better.  Do not put anything in your ear other than medicine that is prescribed by your health care provider. Managing pain If directed, apply heat to the affected area as often as told by your health care provider. Use the heat source that your health care provider recommends, such as a moist heat pack or a heating pad.  Place a towel between your skin and the heat source.  Leave the heat on for 20-30 minutes.  Remove the heat if your skin turns bright red. This is especially important if you are unable to feel pain, heat, or cold. You may have a greater risk of getting burned. If directed, put ice on the affected area as often as told by your health care provider. To do this:      Put ice in a plastic bag.  Place a towel between your skin and the bag.  Leave the ice on for 20 minutes, 2-3 times a day. General instructions  Pay attention to any changes in your symptoms.  Try resting in an upright position instead of  lying down. This may help to reduce pressure in your ear and relieve pain.  Chew gum if it helps to relieve your ear pain.  Treat any allergies as told by your health care provider.  Drink enough fluid to keep your urine pale yellow.  It is up to you to get the results of any tests that were done. Ask your health care provider, or the department that is doing the tests, when your results will be ready.  Keep all follow-up visits as told by your health care provider. This is important. Contact a health care provider if:  Your pain does not improve within 2 days.  Your earache gets worse.  You have new symptoms.  You have a fever. Get help right away if you:  Have a severe headache.  Have a stiff neck.  Have trouble swallowing.  Have redness or swelling behind your ear.  Have fluid or blood coming from your ear.  Have hearing loss.  Feel dizzy. Summary  An earache, or ear pain, can be caused by many things.  Treatment of the earache will depend on the cause. Follow recommendations from your health care provider to treat your ear pain.  If the cause is not clear or cannot be determined, you may need to watch your symptoms until your earache goes away or until a cause  is found.  Keep all follow-up visits as told by your health care provider. This is important. This information is not intended to replace advice given to you by your health care provider. Make sure you discuss any questions you have with your health care provider. Document Revised: 04/04/2019 Document Reviewed: 04/04/2019 Elsevier Patient Education  2020 [employer].

## 2020-03-11 ENCOUNTER — Ambulatory Visit (INDEPENDENT_AMBULATORY_CARE_PROVIDER_SITE_OTHER): Payer: BLUE CROSS/BLUE SHIELD | Admitting: Psychologist

## 2020-03-11 DIAGNOSIS — F330 Major depressive disorder, recurrent, mild: Secondary | ICD-10-CM | POA: Diagnosis not present

## 2020-03-11 DIAGNOSIS — E669 Obesity, unspecified: Secondary | ICD-10-CM | POA: Diagnosis not present

## 2020-03-11 DIAGNOSIS — Z634 Disappearance and death of family member: Secondary | ICD-10-CM | POA: Diagnosis not present

## 2020-03-22 ENCOUNTER — Telehealth (INDEPENDENT_AMBULATORY_CARE_PROVIDER_SITE_OTHER): Payer: BLUE CROSS/BLUE SHIELD | Admitting: Family Medicine

## 2020-03-22 ENCOUNTER — Encounter: Payer: Self-pay | Admitting: Family Medicine

## 2020-03-22 VITALS — Wt 275.0 lb

## 2020-03-22 DIAGNOSIS — Z9884 Bariatric surgery status: Secondary | ICD-10-CM | POA: Diagnosis not present

## 2020-03-22 DIAGNOSIS — E038 Other specified hypothyroidism: Secondary | ICD-10-CM | POA: Diagnosis not present

## 2020-03-22 DIAGNOSIS — R5383 Other fatigue: Secondary | ICD-10-CM | POA: Diagnosis not present

## 2020-03-22 NOTE — Assessment & Plan Note (Signed)
DDx for her fatigue is broad including untreated OSA, malabsorption related to sleeve gastrectomy, hypothyroidism and depression/anxiety.  She feels that depression/anxiety are ok right now despite increased stress.  Will update labs including vitamins and minerals and TSH.  She would be willing to try CPAP again if testing does not reveal any other cause of her symptoms.  She would like to consider different mask options if she tries this again.

## 2020-03-22 NOTE — Progress Notes (Signed)
Michaela Holland - 39 y.o. female MRN [id number]  Date of birth: [birth year]   This visit type was conducted due to national recommendations for restrictions regarding the COVID-19 Pandemic (e.g. social distancing).  This format is felt to be most appropriate for this patient at this time.  All issues noted in this document were discussed and addressed.  No physical exam was performed (except for noted visual exam findings with Video Visits).  I discussed the limitations of evaluation and management by telemedicine and the availability of in person appointments. The patient expressed understanding and agreed to proceed.  I connected with@ on 03/22/20 at 11:10 AM EDT by a video enabled telemedicine application and verified that I am speaking with the correct person using two identifiers.  Present at visit: Everrett Coombe, DO Garald Balding   Patient Location: [facility] [address] [place] [address] [place] [address]   Provider location:   [facility]  Chief Complaint  Patient presents with  . Fatigue    HPI  Michaela Holland is a 39 y.o. female who presents via audio/video conferencing for a telehealth visit today.  She has a history of hypothyroidism, PCOS, anxiety and depression, and sleeve gastrectomy.  She has complaint of fatigue.  She has had this for the past couple of months.  Fatigue has been severe enough that she is falling asleep during work.  Admits to some increased stress due to caring for her father and his eventual death.  She feels like sertraline and buspar are still working pretty well for her.  It has been a while since she has had updated labs.  She is no longer using CPAP for OSA due to intolerance.  She denies fever, chills, appetite or significant weight changes.      ROS:  A comprehensive ROS was completed and negative except as noted per HPI  Past Medical History:  Diagnosis Date  . Anxiety   . Back pain   . Chronic pain of left knee   . Depression   . Diabetes  mellitus without complication (HCC)    denies this diagnosis   . Family history of adverse reaction to anesthesia    per patient ; father was having abdominal surgery and had to be placed on medicine to keep his blood pressure up; he never had any issues with low BP before the surgery "   . Gallbladder problem   . GERD (gastroesophageal reflux disease)   . Gout    believes only occurred once in feet;   . Hypertension    denies this diagnosis   . Hypothyroidism   . Joint pain   . Obesity   . Osteoarthritis   . Palpitations   . PCOS (polycystic ovarian syndrome)   . PONV (postoperative nausea and vomiting)    with c section; no issues with followwing procedures   . Seasonal allergies   . Seizures (HCC)    has had "pre-seizure" activity in the brain but deneis any actual seizures   . Sleep apnea    does not currently use due to insurance   . Vitamin D deficiency     Past Surgical History:  Procedure Laterality Date  . CESAREAN SECTION    . CHOLECYSTECTOMY    . ESOPHAGOGASTRODUODENOSCOPY  2001 and 2018  . GANGLION CYST EXCISION Left 2014   foot  . KNEE ARTHROSCOPY Left 06/28/2017   Procedure: LEFT KNEE ARTHROSCOPY with debridement;  Surgeon: Kathryne Hitch, MD;  Location: [facility];  Service: Orthopedics;  Laterality: Left;  . LAPAROSCOPIC GASTRIC SLEEVE RESECTION N/A 07/22/2018   Procedure: LAPAROSCOPIC GASTRIC SLEEVE RESECTION WITH UPPER ENDO AND ERAS PATHWAY;  Surgeon: Luretha Murphy, MD;  Location: [facility];  Service: General;  Laterality: N/A;    Family History  Problem Relation Age of Onset  . Polycystic ovary syndrome Mother   . Hypothyroidism Mother   . Hyperlipidemia Mother   . Depression Mother   . Obesity Mother   . Polycystic ovary syndrome Sister   . Hypothyroidism Sister   . Hypertension Father   . Hyperlipidemia Father   . Cancer Father   . Depression Father   . Anxiety disorder Father   . Obesity Father   . Asthma Other     Social History    Socioeconomic History  . Marital status: Married    Spouse name: Ree Kida Kotas  . Number of children: 1  . Years of education: Not on file  . Highest education level: Not on file  Occupational History  . Occupation: Designer, jewellery  Tobacco Use  . Smoking status: Never Smoker  . Smokeless tobacco: Never Used  Vaping Use  . Vaping Use: Never used  Substance and Sexual Activity  . Alcohol use: Never  . Drug use: No  . Sexual activity: Yes    Partners: Male    Birth control/protection: None  Other Topics Concern  . Not on file  Social History Narrative  . Not on file   Social Determinants of Health   Financial Resource Strain:   . Difficulty of Paying Living Expenses:   Food Insecurity:   . Worried About Programme researcher, broadcasting/film/video in the Last Year:   . Barista in the Last Year:   Transportation Needs:   . Freight forwarder (Medical):   Marland Kitchen Lack of Transportation (Non-Medical):   Physical Activity:   . Days of Exercise per Week:   . Minutes of Exercise per Session:   Stress:   . Feeling of Stress :   Social Connections:   . Frequency of Communication with Friends and Family:   . Frequency of Social Gatherings with Friends and Family:   . Attends Religious Services:   . Active Member of Clubs or Organizations:   . Attends Banker Meetings:   Marland Kitchen Marital Status:   Intimate Partner Violence:   . Fear of Current or Ex-Partner:   . Emotionally Abused:   Marland Kitchen Physically Abused:   . Sexually Abused:      Current Outpatient Medications:  .  albuterol (PROVENTIL HFA;VENTOLIN HFA) 108 (90 Base) MCG/ACT inhaler, Inhale 1-2 puffs into the lungs every 4 (four) hours as needed for wheezing or shortness of breath (bronchospasm)., Disp: 1 Inhaler, Rfl: 0 .  busPIRone (BUSPAR) 15 MG tablet, Take 15 mg by mouth 2 (two) times daily., Disp: , Rfl:  .  calcium-vitamin D (OSCAL WITH D) 500-200 MG-UNIT tablet, Take 1 tablet by mouth 3 (three) times daily., Disp: , Rfl:  .   cetirizine (ZYRTEC) 10 MG tablet, Take 1 tablet (10 mg total) by mouth 2 (two) times daily., Disp: 60 tablet, Rfl: 11 .  clonazePAM (KLONOPIN) 0.5 MG tablet, Take 0.5-1 tablets (0.25-0.5 mg total) by mouth 2 (two) times daily as needed for anxiety (#30 for 90 days.)., Disp: 30 tablet, Rfl: 0 .  clotrimazole-betamethasone (LOTRISONE) cream, Apply 1 application topically 2 (two) times daily., Disp: 45 g, Rfl: 1 .  cyclobenzaprine (FLEXERIL) 10 MG tablet, Take 1 tablet (  10 mg total) by mouth 3 (three) times daily as needed for muscle spasms. WILL CAUSE DROWSINESS., Disp: 60 tablet, Rfl: 2 .  Diclofenac Sodium (PENNSAID) 2 % SOLN, Place 1 application onto the skin 2 (two) times daily., Disp: 112 g, Rfl: 2 .  EPINEPHrine 0.3 mg/0.3 mL IJ SOAJ injection, Inject 0.3 mg into the muscle daily as needed (for anaphylactic reactions.). , Disp: , Rfl:  .  ferrous sulfate 325 (65 FE) MG tablet, Take 1 tablet by mouth daily., Disp: , Rfl:  .  gabapentin (NEURONTIN) 100 MG capsule, Take 1-3 capsules (100-300 mg total) by mouth 3 (three) times daily as needed., Disp: 90 capsule, Rfl: 0 .  gabapentin (NEURONTIN) 300 MG capsule, Take 1 capsule (300 mg total) by mouth 3 (three) times daily., Disp: 180 capsule, Rfl: 1 .  ibuprofen (ADVIL) 200 MG tablet, Take 200 mg by mouth as needed., Disp: , Rfl:  .  levothyroxine (SYNTHROID) 175 MCG tablet, TAKE 1 TABLET BY MOUTH ONCE DAILY BEFORE  BREAKFAST, Disp: 90 tablet, Rfl: 2 .  meloxicam (MOBIC) 7.5 MG tablet, Take 1 tablet (7.5 mg total) by mouth daily., Disp: 10 tablet, Rfl: 0 .  metFORMIN (GLUCOPHAGE-XR) 500 MG 24 hr tablet, Take 1 tablet (500 mg total) by mouth at bedtime., Disp: 90 tablet, Rfl: 3 .  Multiple Vitamins-Minerals (BARIATRIC FUSION) CHEW, Chew 1 tablet by mouth 2 (two) times daily., Disp: , Rfl:  .  nystatin (MYCOSTATIN/NYSTOP) powder, Apply topically 4 (four) times daily. As needed for fungal skin infection, Disp: 45 g, Rfl: 1 .  ondansetron (ZOFRAN-ODT) 4 MG  disintegrating tablet, Take 4 mg by mouth 3 (three) times daily as needed for nausea/vomiting., Disp: , Rfl: 0 .  pantoprazole (PROTONIX) 40 MG tablet, Take 40 mg by mouth daily., Disp: , Rfl: 2 .  Probiotic Product (ACIDOPHILUS/GOAT MILK) CAPS, Take 1 tablet by mouth daily as needed., Disp: , Rfl:  .  sertraline (ZOLOFT) 100 MG tablet, Take 2 tablets by mouth daily., Disp: , Rfl:  .  triamcinolone (NASACORT) 55 MCG/ACT AERO nasal inhaler, Place 1 spray into the nose daily. I spray in each nostril, Disp: 16.9 mL, Rfl: 12 .  Turmeric 500 MG CAPS, Take 1,000 mg by mouth at bedtime., Disp: , Rfl:  .  XULANE 150-35 MCG/24HR transdermal patch, APPLY 1 PATCH ONCE WEEKLY FOR 3 WEEKS. OFF FOR 1 WEEK. REPEAT MONTHLY, Disp: 9 patch, Rfl: 3  EXAM:  VITALS per patient if applicable: Wt 275 lb (124.7 kg)   BMI 47.95 kg/m   GENERAL: alert, oriented, appears well and in no acute distress  HEENT: atraumatic, conjunttiva clear, no obvious abnormalities on inspection of external nose and ears  NECK: normal movements of the head and neck  LUNGS: on inspection no signs of respiratory distress, breathing rate appears normal, no obvious gross SOB, gasping or wheezing  CV: no obvious cyanosis  MS: moves all visible extremities without noticeable abnormality  PSYCH/NEURO: pleasant and cooperative, no obvious depression or anxiety, speech and thought processing grossly intact  ASSESSMENT AND PLAN:  Discussed the following assessment and plan:  Other fatigue DDx for her fatigue is broad including untreated OSA, malabsorption related to sleeve gastrectomy, hypothyroidism and depression/anxiety.  She feels that depression/anxiety are ok right now despite increased stress.  Will update labs including vitamins and minerals and TSH.  She would be willing to try CPAP again if testing does not reveal any other cause of her symptoms.  She would like to consider different mask  options if she tries this again.     30 minutes spent including pre visit preparation, review of prior notes and labs, encounter with patient via video visit and same day documentation.    I discussed the assessment and treatment plan with the patient. The patient was provided an opportunity to ask questions and all were answered. The patient agreed with the plan and demonstrated an understanding of the instructions.   The patient was advised to call back or seek an in-person evaluation if the symptoms worsen or if the condition fails to improve as anticipated.    Everrett Coombe, DO

## 2020-03-30 ENCOUNTER — Ambulatory Visit: Payer: BLUE CROSS/BLUE SHIELD | Admitting: Psychologist

## 2020-04-01 ENCOUNTER — Encounter: Payer: Self-pay | Admitting: Medical-Surgical

## 2020-04-01 ENCOUNTER — Telehealth (INDEPENDENT_AMBULATORY_CARE_PROVIDER_SITE_OTHER): Payer: BLUE CROSS/BLUE SHIELD | Admitting: Medical-Surgical

## 2020-04-01 VITALS — Temp 97.6°F | Wt 274.0 lb

## 2020-04-01 DIAGNOSIS — M79645 Pain in left finger(s): Secondary | ICD-10-CM

## 2020-04-01 DIAGNOSIS — R21 Rash and other nonspecific skin eruption: Secondary | ICD-10-CM

## 2020-04-01 NOTE — Progress Notes (Signed)
Virtual Visit via Video Note  I connected with Michaela Holland on 04/01/20 at  1:00 PM EDT by a video enabled telemedicine application and verified that I am speaking with the correct person using two identifiers.   I discussed the limitations of evaluation and management by telemedicine and the availability of in person appointments. The patient expressed understanding and agreed to proceed.  Patient location: home Provider locations: office  Subjective:    CC: Flare up of redness on hands and feet  HPI: Pleasant 39 year old female presenting via MyChart video visit with reports of hand and foot redness that has intermittently flared recently.  Over the last 3 days she has had significant pain in the base of her left thumb that interfered with range of motion.  Yesterday the pain had improved slightly but it was still sore.  Today she is able to move it without difficulty and the pain has resolved.  She is unsure what may have caused this.  She has had gout flares in the past and her feet and was wondering if this may be gout in her thumb.  Red patches on her feet have been occurring off and on for the past several weeks to months.  Notes that she was previously treated for this with a combination cream, unsure of the name.  Previously thought this was psoriasis or fungal in nature.  Red patches have been on the bottoms or sides of her feet and are extremely itchy.  Her last red patch developed on the lateral left foot 2 days ago, described as red and painful with severe itching.  Notes patches usually go away within 2 to 3 days without treatment.  Most recent patch has already resolved.  She notes no new foods, chemicals, or exposures.  Past medical history, Surgical history, Family history not pertinant except as noted below, Social history, Allergies, and medications have been entered into the medical record, reviewed, and corrections made.   Review of Systems: See HPI for pertinent positives  and negatives.   Objective:    General: Speaking clearly in complete sentences without any shortness of breath.  Alert and oriented x3.  Normal judgment. No apparent acute distress.  Impression and Recommendations:    1. Pain of left thumb Unsure of cause of her left thumb pain.  Since this has resolved, no intervention today.  Discussed difficulty in diagnosing a condition in a virtual appointment where her symptoms have resolved.  2. Rash in adult Again, no rash present for visualization today.  Advised patient to send the photos she reports she has via MyChart and a message for further evaluation.  She will most likely need to be seen in person for further evaluation should her rash develop again.  Labs ordered on 03/22/2020 by Dr. Ashley Royalty still showing active.  Advised patient that she should have these drawn as soon as possible as they will give [place] information to help narrow down possible causes of her concerns.  She states she will be having those drawn today.  Return if symptoms worsen or fail to improve.  20 minutes of non-face-to-face time was provided during this encounter.  I discussed the assessment and treatment plan with the patient. The patient was provided an opportunity to ask questions and all were answered. The patient agreed with the plan and demonstrated an understanding of the instructions.   The patient was advised to call back or seek an in-person evaluation if the symptoms worsen or if the condition fails  to improve as anticipated.  Thayer Ohm, DNP, APRN, FNP-BC [facility]

## 2020-04-02 DIAGNOSIS — Z20822 Contact with and (suspected) exposure to covid-19: Secondary | ICD-10-CM | POA: Diagnosis not present

## 2020-04-04 ENCOUNTER — Encounter: Payer: Self-pay | Admitting: Medical-Surgical

## 2020-04-04 DIAGNOSIS — Z9884 Bariatric surgery status: Secondary | ICD-10-CM | POA: Diagnosis not present

## 2020-04-04 DIAGNOSIS — E038 Other specified hypothyroidism: Secondary | ICD-10-CM | POA: Diagnosis not present

## 2020-04-04 DIAGNOSIS — R5383 Other fatigue: Secondary | ICD-10-CM | POA: Diagnosis not present

## 2020-04-07 DIAGNOSIS — Z72820 Sleep deprivation: Secondary | ICD-10-CM | POA: Diagnosis not present

## 2020-04-09 LAB — COMPLETE METABOLIC PANEL WITH GFR
AG Ratio: 1.5 (calc) (ref 1.0–2.5)
ALT: 9 U/L (ref 6–29)
AST: 13 U/L (ref 10–30)
Albumin: 4.1 g/dL (ref 3.6–5.1)
Alkaline phosphatase (APISO): 53 U/L (ref 31–125)
BUN: 15 mg/dL (ref 7–25)
CO2: 26 mmol/L (ref 20–32)
Calcium: 9.2 mg/dL (ref 8.6–10.2)
Chloride: 105 mmol/L (ref 98–110)
Creat: 0.81 mg/dL (ref 0.50–1.10)
GFR, Est African American: 107 mL/min/{1.73_m2} (ref >=60)
GFR, Est Non African American: 92 mL/min/{1.73_m2} (ref >=60)
Globulin: 2.7 g/dL (calc) (ref 1.9–3.7)
Glucose, Bld: 86 mg/dL (ref 65–139)
Potassium: 4.2 mmol/L (ref 3.5–5.3)
Sodium: 140 mmol/L (ref 135–146)
Total Bilirubin: 0.5 mg/dL (ref 0.2–1.2)
Total Protein: 6.8 g/dL (ref 6.1–8.1)

## 2020-04-09 LAB — VITAMIN B1: Vitamin B1 (Thiamine): 18 nmol/L (ref 8–30)

## 2020-04-09 LAB — CBC
HCT: 40.1 % (ref 35.0–45.0)
Hemoglobin: 13.2 g/dL (ref 11.7–15.5)
MCH: 28.3 pg (ref 27.0–33.0)
MCHC: 32.9 g/dL (ref 32.0–36.0)
MCV: 85.9 fL (ref 80.0–100.0)
MPV: 10.2 fL (ref 7.5–12.5)
Platelets: 319 10*3/uL (ref 140–400)
RBC: 4.67 10*6/uL (ref 3.80–5.10)
RDW: 12.5 % (ref 11.0–15.0)
WBC: 7.4 10*3/uL (ref 3.8–10.8)

## 2020-04-09 LAB — IRON,TIBC AND FERRITIN PANEL
Ferritin: 27 ng/mL (ref 16–154)
Iron: 84 ug/dL (ref 40–190)

## 2020-04-09 LAB — TSH: TSH: 1.24 mIU/L

## 2020-04-09 LAB — B12 AND FOLATE PANEL
Folate: 18.7 ng/mL
Vitamin B-12: 454 pg/mL (ref 200–1100)

## 2020-04-12 ENCOUNTER — Ambulatory Visit: Payer: Self-pay | Admitting: Psychologist

## 2020-04-17 DIAGNOSIS — F3341 Major depressive disorder, recurrent, in partial remission: Secondary | ICD-10-CM | POA: Diagnosis not present

## 2020-04-18 ENCOUNTER — Other Ambulatory Visit: Payer: Self-pay | Admitting: Osteopathic Medicine

## 2020-04-18 ENCOUNTER — Encounter: Payer: Self-pay | Admitting: Osteopathic Medicine

## 2020-06-02 DIAGNOSIS — F4323 Adjustment disorder with mixed anxiety and depressed mood: Secondary | ICD-10-CM | POA: Diagnosis not present

## 2020-06-16 DIAGNOSIS — Z9884 Bariatric surgery status: Secondary | ICD-10-CM | POA: Diagnosis not present

## 2020-06-22 DIAGNOSIS — F4323 Adjustment disorder with mixed anxiety and depressed mood: Secondary | ICD-10-CM | POA: Diagnosis not present

## 2020-06-27 DIAGNOSIS — F325 Major depressive disorder, single episode, in full remission: Secondary | ICD-10-CM | POA: Diagnosis not present

## 2020-07-18 ENCOUNTER — Other Ambulatory Visit: Payer: Self-pay

## 2020-07-18 ENCOUNTER — Telehealth (INDEPENDENT_AMBULATORY_CARE_PROVIDER_SITE_OTHER): Payer: BLUE CROSS/BLUE SHIELD | Admitting: Medical-Surgical

## 2020-07-18 ENCOUNTER — Encounter: Payer: Self-pay | Admitting: Medical-Surgical

## 2020-07-18 VITALS — BP 121/84 | HR 75 | Temp 97.0°F

## 2020-07-18 DIAGNOSIS — J069 Acute upper respiratory infection, unspecified: Secondary | ICD-10-CM | POA: Diagnosis not present

## 2020-07-18 MED ORDER — ALBUTEROL SULFATE HFA 108 (90 BASE) MCG/ACT IN AERS: 1.0000 | INHALATION_SPRAY | RESPIRATORY_TRACT | 1 refills | Status: AC | PRN

## 2020-07-18 MED ORDER — IPRATROPIUM BROMIDE 0.03 % NA SOLN: 2.0000 | Freq: Two times a day (BID) | NASAL | 0 refills | Status: DC

## 2020-07-18 MED ORDER — AMOXICILLIN-POT CLAVULANATE 875-125 MG PO TABS: 1.0000 | ORAL_TABLET | Freq: Two times a day (BID) | ORAL | 0 refills | Status: DC

## 2020-07-18 MED ORDER — GUAIFENESIN-CODEINE 100-10 MG/5ML PO SYRP
5.0000 mL | ORAL_SOLUTION | Freq: Three times a day (TID) | ORAL | 0 refills | Status: DC | PRN
Start: 2020-07-18 — End: 2024-01-07

## 2020-07-18 NOTE — Progress Notes (Signed)
Virtual Visit via Video Note  I connected with Michaela Holland on 07/18/20 at  3:00 PM EST by a video enabled telemedicine application and verified that I am speaking with the correct person using two identifiers.   I discussed the limitations of evaluation and management by telemedicine and the availability of in person appointments. The patient expressed understanding and agreed to proceed.  Patient location: home Provider locations: office  Subjective:    CC: Upper respiratory symptoms  HPI: Pleasant 39 year old female presenting today via MyChart video visit for 1 week of upper respiratory symptoms including fatigue, cough productive of yellow mucus, chest congestion, facial pain/pressure, decreased appetite, sinus congestion, ear pressure, intermittent wheezing, rhinorrhea, and postnasal drip.  She does endorse a little bit of shortness of breath with coughing spells or exertion.  Denies fever, chest pain, chills, headache, dizziness, and GI symptoms.  She has been trying over-the-counter cold and flu medications as well as Nasacort but had minimal relief from those.  She has had her Covid vaccines but has not tested since the development of her symptoms.  Notes that her daughter and husband were sick last week.  Her daughter was tested for Covid and found negative with a PCR test.  Her husband was not tested for Covid.  Past medical history, Surgical history, Family history not pertinant except as noted below, Social history, Allergies, and medications have been entered into the medical record, reviewed, and corrections made.   Review of Systems: See HPI for pertinent positives and negatives.   Objective:    General: Speaking clearly in complete sentences without any shortness of breath.  Alert and oriented x3.  Normal judgment. No apparent acute distress.  Impression and Recommendations:    1. Acute upper respiratory infection Recommend Covid testing.  With 1 week of moderate to severe  symptoms, treating empirically with Augmentin twice daily x7 days.  Sending in refill of her albuterol for wheezing.  Also sending Atrovent nasal spray for rhinorrhea.  Robitussin-AC 3 times daily as needed for cough.  Increase p.o. fluids.  Rest as much as possible.  If Covid test comes back positive, advised patient to notify us immediately as we will refer her to the antibody infusion team.  Work note provided for today and tomorrow via MyChart as requested. - albuterol (VENTOLIN HFA) 108 (90 Base) MCG/ACT inhaler; Inhale 1-2 puffs into the lungs every 4 (four) hours as needed for wheezing or shortness of breath (bronchospasm).  Dispense: 18 g; Refill: 1  I discussed the assessment and treatment plan with the patient. The patient was provided an opportunity to ask questions and all were answered. The patient agreed with the plan and demonstrated an understanding of the instructions.   The patient was advised to call back or seek an in-person evaluation if the symptoms worsen or if the condition fails to improve as anticipated.  20 minutes of non-face-to-face time was provided during this encounter.  Return if symptoms worsen or fail to improve.  Thayer Ohm, DNP, APRN, FNP-BC [facility]

## 2020-07-19 ENCOUNTER — Telehealth: Payer: BLUE CROSS/BLUE SHIELD | Admitting: Family Medicine

## 2020-07-19 DIAGNOSIS — Z20822 Contact with and (suspected) exposure to covid-19: Secondary | ICD-10-CM | POA: Diagnosis not present

## 2020-07-21 DIAGNOSIS — F4323 Adjustment disorder with mixed anxiety and depressed mood: Secondary | ICD-10-CM | POA: Diagnosis not present

## 2020-07-26 ENCOUNTER — Telehealth: Payer: BLUE CROSS/BLUE SHIELD | Admitting: Osteopathic Medicine

## 2020-07-29 ENCOUNTER — Other Ambulatory Visit: Payer: Self-pay | Admitting: Osteopathic Medicine

## 2020-07-29 ENCOUNTER — Encounter: Payer: Self-pay | Admitting: Osteopathic Medicine

## 2020-07-29 MED ORDER — LEVOTHYROXINE SODIUM 175 MCG PO TABS
175.0000 ug | ORAL_TABLET | Freq: Every day | ORAL | 2 refills | Status: DC
Start: 2020-07-29 — End: 2021-10-03

## 2020-08-01 DIAGNOSIS — F3341 Major depressive disorder, recurrent, in partial remission: Secondary | ICD-10-CM | POA: Diagnosis not present

## 2020-08-09 ENCOUNTER — Other Ambulatory Visit: Payer: Self-pay | Admitting: Medical-Surgical

## 2020-08-24 ENCOUNTER — Other Ambulatory Visit: Payer: Self-pay

## 2020-08-24 ENCOUNTER — Ambulatory Visit (INDEPENDENT_AMBULATORY_CARE_PROVIDER_SITE_OTHER): Payer: BLUE CROSS/BLUE SHIELD | Admitting: Osteopathic Medicine

## 2020-08-24 DIAGNOSIS — Z23 Encounter for immunization: Secondary | ICD-10-CM

## 2020-09-03 ENCOUNTER — Other Ambulatory Visit: Payer: Self-pay | Admitting: Osteopathic Medicine

## 2020-09-19 ENCOUNTER — Ambulatory Visit: Payer: BLUE CROSS/BLUE SHIELD | Admitting: Osteopathic Medicine

## 2020-09-23 ENCOUNTER — Other Ambulatory Visit: Payer: Self-pay | Admitting: Osteopathic Medicine

## 2020-10-04 ENCOUNTER — Emergency Department: Admit: 2020-10-04 | Discharge status: Absent without leave | Payer: Self-pay

## 2020-10-04 ENCOUNTER — Other Ambulatory Visit: Payer: Self-pay

## 2020-10-04 ENCOUNTER — Emergency Department (INDEPENDENT_AMBULATORY_CARE_PROVIDER_SITE_OTHER)
Admission: EM | Admit: 2020-10-04 | Discharge: 2020-10-04 | Disposition: A | Discharge status: Home / Self Care | Payer: BLUE CROSS/BLUE SHIELD | Source: Home / Self Care | Attending: Family Medicine | Admitting: Family Medicine

## 2020-10-04 DIAGNOSIS — J069 Acute upper respiratory infection, unspecified: Secondary | ICD-10-CM | POA: Diagnosis not present

## 2020-10-04 NOTE — ED Provider Notes (Signed)
[facility]    CSN: [id number] Arrival date & time: 10/04/20  1801      History   Chief Complaint Chief Complaint  Patient presents with  . Nasal Congestion  . Sore Throat    HPI Michaela Holland is a 40 y.o. female.   She is presenting with a 1 day history of congestion and frontal sinus pressure.  Having some malaise.  Has been vaccinated against Covid.  HPI  Past Medical History:  Diagnosis Date  . Anxiety   . Back pain   . Chronic pain of left knee   . Depression   . Diabetes mellitus without complication (HCC)    denies this diagnosis   . Family history of adverse reaction to anesthesia    per patient ; father was having abdominal surgery and had to be placed on medicine to keep his blood pressure up; he never had any issues with low BP before the surgery "   . Gallbladder problem   . GERD (gastroesophageal reflux disease)   . Gout    believes only occurred once in feet;   . Hypertension    denies this diagnosis   . Hypothyroidism   . Joint pain   . Obesity   . Osteoarthritis   . Palpitations   . PCOS (polycystic ovarian syndrome)   . PONV (postoperative nausea and vomiting)    with c section; no issues with followwing procedures   . Seasonal allergies   . Seizures (HCC)    has had "pre-seizure" activity in the brain but deneis any actual seizures   . Sleep apnea    does not currently use due to insurance   . Vitamin D deficiency     Patient Active Problem List   Diagnosis Date Noted  . Bariatric surgery status 07/03/2019  . Chronic pain of right knee 09/11/2018  . Unilateral primary osteoarthritis, right knee 09/11/2018  . S/P laparoscopic sleeve gastrectomyNov2019 07/22/2018  . Closed nondisplaced fracture of proximal phalanx of lesser toe of left foot 06/04/2018  . Overactive bladder 03/28/2018  . Urinary incontinence, nocturnal enuresis 03/28/2018  . Unilateral primary osteoarthritis, left knee 02/24/2018  . H/O arthroscopy of left knee  02/24/2018  . Baker cyst, left 01/01/2018  . Other fatigue 10/22/2017  . Shortness of breath on exertion 10/22/2017  . Vitamin D deficiency 10/22/2017  . S/P arthroscopy of left knee 07/18/2017  . Meniscal cyst, left 06/17/2017  . Cyst of lateral meniscus of left knee 05/08/2017  . Anterior cruciate ligament sprain 05/08/2017  . Obesity 05/08/2017  . History of obstructive sleep apnea 04/09/2017  . Hypothyroidism 04/09/2017  . PCOS (polycystic ovarian syndrome) 04/09/2017  . Anxiety 04/09/2017  . Chronic pain of left knee 04/09/2017  . Gastroesophageal reflux disease with esophagitis 04/09/2017    Past Surgical History:  Procedure Laterality Date  . CESAREAN SECTION    . CHOLECYSTECTOMY    . ESOPHAGOGASTRODUODENOSCOPY  2001 and 2018  . GANGLION CYST EXCISION Left 2014   foot  . KNEE ARTHROSCOPY Left 06/28/2017   Procedure: LEFT KNEE ARTHROSCOPY with debridement;  Surgeon: Kathryne Hitch, MD;  Location: [facility];  Service: Orthopedics;  Laterality: Left;  . LAPAROSCOPIC GASTRIC SLEEVE RESECTION N/A 07/22/2018   Procedure: LAPAROSCOPIC GASTRIC SLEEVE RESECTION WITH UPPER ENDO AND ERAS PATHWAY;  Surgeon: Luretha Murphy, MD;  Location: [facility];  Service: General;  Laterality: N/A;    OB History    Gravida  1   Para  Term      Preterm      AB      Living  1     SAB      IAB      Ectopic      Multiple      Live Births               Home Medications    Prior to Admission medications   Medication Sig Start Date End Date Taking? Authorizing Provider  albuterol (VENTOLIN HFA) 108 (90 Base) MCG/ACT inhaler Inhale 1-2 puffs into the lungs every 4 (four) hours as needed for wheezing or shortness of breath (bronchospasm). 07/18/20   Christen Butter, NP  amoxicillin-clavulanate (AUGMENTIN) 875-125 MG tablet Take 1 tablet by mouth 2 (two) times daily. 07/18/20   Christen Butter, NP  busPIRone (BUSPAR) 30 MG tablet Take 30 mg by mouth daily.  04/28/20   [provider]  calcium-vitamin D (OSCAL WITH D) 500-200 MG-UNIT tablet Take 1 tablet by mouth 3 (three) times daily.    [provider]  cetirizine (ZYRTEC) 10 MG tablet Take 1 tablet (10 mg total) by mouth 2 (two) times daily. 03/08/20   Early, Sung Amabile, NP  clonazePAM (KLONOPIN) 0.5 MG tablet Take 0.5-1 tablets (0.25-0.5 mg total) by mouth 2 (two) times daily as needed for anxiety (#30 for 90 days.). 03/25/18   Burnard Leigh, MD  clotrimazole-betamethasone (LOTRISONE) cream Apply 1 application topically 2 (two) times daily. 11/12/19   Tollie Eth, NP  cyclobenzaprine (FLEXERIL) 10 MG tablet Take 1 tablet (10 mg total) by mouth 3 (three) times daily as needed for muscle spasms. WILL CAUSE DROWSINESS. 10/20/19   Early, Sung Amabile, NP  Diclofenac Sodium (PENNSAID) 2 % SOLN Place 1 application onto the skin 2 (two) times daily. 03/02/20   Rodolph Bong, MD  EPINEPHrine 0.3 mg/0.3 mL IJ SOAJ injection Inject 0.3 mg into the muscle daily as needed (for anaphylactic reactions.).  09/26/16   [provider]  ferrous sulfate 325 (65 FE) MG tablet Take 1 tablet by mouth daily.    [provider]  gabapentin (NEURONTIN) 100 MG capsule Take 1-3 capsules (100-300 mg total) by mouth 3 (three) times daily as needed. 06/23/19   Sunnie Nielsen, DO  gabapentin (NEURONTIN) 300 MG capsule Take 1 capsule (300 mg total) by mouth 3 (three) times daily. 02/29/20   Sunnie Nielsen, DO  guaiFENesin-codeine [facility]) 100-10 MG/5ML syrup Take 5 mLs by mouth 3 (three) times daily as needed for cough. 07/18/20   Christen Butter, NP  ibuprofen (ADVIL) 200 MG tablet Take 200 mg by mouth as needed.    [provider]  ipratropium (ATROVENT) 0.03 % nasal spray PLACE 2 SPRAYS INTO BOTH NOSTRILS EVERY 12 (TWELVE) HOURS. 09/23/20   Sunnie Nielsen, DO  levothyroxine (SYNTHROID) 175 MCG tablet Take 1 tablet (175 mcg total) by mouth daily before breakfast. 07/29/20   Sunnie Nielsen, DO   meloxicam (MOBIC) 7.5 MG tablet Take 1 tablet (7.5 mg total) by mouth daily. 01/13/20   Cathie Hoops, Amy V, PA-C  metFORMIN (GLUCOPHAGE-XR) 500 MG 24 hr tablet Take 1 tablet (500 mg total) by mouth at bedtime. 07/03/19   Rodolph Bong, MD  Multiple Vitamins-Minerals (BARIATRIC FUSION) CHEW Chew 1 tablet by mouth 2 (two) times daily.    [provider]  nystatin (MYCOSTATIN/NYSTOP) powder Apply topically 4 (four) times daily. As needed for fungal skin infection 02/11/19   Sunnie Nielsen, DO  ondansetron (ZOFRAN-ODT)  4 MG disintegrating tablet Take 4 mg by mouth 3 (three) times daily as needed for nausea/vomiting.  07/02/18   [provider]  pantoprazole (PROTONIX) 40 MG tablet Take 40 mg by mouth daily. 07/02/18   [provider]  Probiotic Product (ACIDOPHILUS/GOAT MILK) CAPS Take 1 tablet by mouth daily as needed.    [provider]  sertraline (ZOLOFT) 100 MG tablet Take 2 tablets by mouth daily. 10/02/19   [provider]  triamcinolone (NASACORT) 55 MCG/ACT AERO nasal inhaler Place 1 spray into the nose daily. I spray in each nostril 03/08/20   Early, Sung AmabileSara E, NP  Turmeric 500 MG CAPS Take 1,000 mg by mouth at bedtime.    [provider]  Burr MedicoXULANE 150-35 MCG/24HR transdermal patch APPLY 1 PATCH ONCE WEEKLY FOR 3 WEEKS. OFF FOR 1 WEEK. REPEAT MONTHLY 09/01/19   Sunnie NielsenAlexander, Natalie, DO    Family History Family History  Problem Relation Age of Onset  . Polycystic ovary syndrome Mother   . Hypothyroidism Mother   . Hyperlipidemia Mother   . Depression Mother   . Obesity Mother   . Polycystic ovary syndrome Sister   . Hypothyroidism Sister   . Hypertension Father   . Hyperlipidemia Father   . Cancer Father   . Depression Father   . Anxiety disorder Father   . Obesity Father   . Asthma Other     Social History Social History   Tobacco Use  . Smoking status: Never Smoker  . Smokeless tobacco: Never Used  Vaping Use  . Vaping Use: Never  used  Substance Use Topics  . Alcohol use: Never  . Drug use: No     Allergies   Sulfa antibiotics, Other, and Adhesive [tape]   Review of Systems Review of Systems  See HPI  Physical Exam Triage Vital Signs ED Triage Vitals  Enc Vitals Group     BP 10/04/20 1911 128/88     Pulse Rate 10/04/20 1911 71     Resp 10/04/20 1911 15     Temp 10/04/20 1911 98.9 F (37.2 C)     Temp Source 10/04/20 1911 Oral     SpO2 10/04/20 1911 100 %     Weight --      Height --      Head Circumference --      Peak Flow --      Pain Score 10/04/20 1909 5     Pain Loc --      Pain Edu? --      Excl. in GC? --    No data found.  Updated Vital Signs BP 128/88 (BP Location: Right Arm)   Pulse 71   Temp 98.9 F (37.2 C) (Oral)   Resp 15   SpO2 100%   Visual Acuity Right Eye Distance:   Left Eye Distance:   Bilateral Distance:    Right Eye Near:   Left Eye Near:    Bilateral Near:     Physical Exam Gen: NAD, alert, cooperative with exam, well-appearing ENT: normal lips, normal nasal mucosa, tympanic membranes clear and intact bilaterally, normal oropharynx, no cervical lymphadenopathy CV:  regular rate and rhythm, S1-S2   Resp: no accessory muscle use, non-labored, clear to auscultation bilaterally, no crackles or wheezes  Skin: no rashes, no areas of induration  Neuro: normal tone, normal sensation to touch Psych:  normal insight, alert and oriented MSK: Normal gait, normal strength    UC Treatments / Results  Labs (all labs  ordered are listed, but only abnormal results are displayed) Labs Reviewed  COVID-19, FLU A+B NAA    EKG   Radiology No results found.  Procedures Procedures (including critical care time)  Medications Ordered in UC Medications - No data to display  Initial Impression / Assessment and Plan / UC Course  I have reviewed the triage vital signs and the nursing notes.  Pertinent labs & imaging results that were available during my care of  the patient were reviewed by me and considered in my medical decision making (see chart for details).     Ms. Holsapple is a 40 year old female that is presenting with viral illness.  Possible for Covid.  Swab was obtained.  Counseled on supportive care.  Given indications on follow-up.  Final Clinical Impressions(s) / UC Diagnoses   Final diagnoses:  Upper respiratory tract infection, unspecified type     Discharge Instructions     Please try things such as zyrtec-D or allegra-D which is an antihistamine and decongestant.  Please try afrin which will help with nasal congestion but use for only three days.  Please also try using a netti pot on a regular occasion. Please try honey, vick's vapor rub, lozenges and humidifer for cough and sore throat   We will inform of positive results.  Please follow up if your symptoms fail to improve.     ED Prescriptions    None     PDMP not reviewed this encounter.   Myra Rude, MD 10/04/20 2123

## 2020-10-04 NOTE — Discharge Instructions (Signed)
Please try things such as zyrtec-D or allegra-D which is an antihistamine and decongestant.  °Please try afrin which will help with nasal congestion but use for only three days.  °Please also try using a netti pot on a regular occasion. °Please try honey, vick's vapor rub, lozenges and humidifer for cough and sore throat  ° °We will inform of positive results.  °Please follow up if your symptoms fail to improve.  °

## 2020-10-04 NOTE — ED Triage Notes (Signed)
Patient presents to Urgent Care with complaints of nasal congestion, cough, ear pressure, sore throat, and fatigue since yesterday. Patient reports she feels like she has a lot of sinus pressure in her head.

## 2020-10-07 ENCOUNTER — Telehealth: Payer: BLUE CROSS/BLUE SHIELD | Admitting: Physician Assistant

## 2020-10-07 LAB — COVID-19, FLU A+B NAA
Influenza A, NAA: NOT DETECTED
Influenza B, NAA: NOT DETECTED
SARS-CoV-2, NAA: NOT DETECTED

## 2021-02-10 DIAGNOSIS — B349 Viral infection, unspecified: Secondary | ICD-10-CM | POA: Insufficient documentation

## 2021-06-28 ENCOUNTER — Encounter: Payer: Self-pay | Admitting: Medical-Surgical

## 2021-06-29 ENCOUNTER — Telehealth: Payer: Self-pay | Admitting: Osteopathic Medicine

## 2021-06-29 NOTE — Telephone Encounter (Signed)
Unable to refill the rx. Zoloft rx written by historical provider.

## 2021-06-29 NOTE — Telephone Encounter (Signed)
Pt called. She's about out of her Zoloft.  She has appointment with Ander Slade on November 21st.

## 2021-06-30 MED ORDER — SERTRALINE HCL 100 MG PO TABS: 200.0000 mg | ORAL_TABLET | Freq: Every day | ORAL | 0 refills | Status: DC

## 2021-06-30 NOTE — Telephone Encounter (Signed)
Meds ordered this encounter  Medications   sertraline (ZOLOFT) 100 MG tablet    Sig: Take 2 tablets (200 mg total) by mouth daily.    Dispense:  60 tablet    Refill:  0

## 2021-06-30 NOTE — Telephone Encounter (Signed)
Noted. Patient has been updated of rx refill. During the call patient requested a refill for buspirone rx. Unable to send in med refill to the pharmacy. Rx written by historical provider.

## 2021-07-01 ENCOUNTER — Other Ambulatory Visit: Payer: Self-pay | Admitting: Family Medicine

## 2021-07-03 MED ORDER — BUSPIRONE HCL 30 MG PO TABS: 30.0000 mg | ORAL_TABLET | Freq: Every day | ORAL | 1 refills | Status: DC

## 2021-07-03 NOTE — Telephone Encounter (Signed)
Attempted to contact patient regarding sertraline and buspar refills. Unable to leave a vm msg. Vm is full.

## 2021-07-03 NOTE — Telephone Encounter (Signed)
Meds ordered this encounter  Medications   sertraline (ZOLOFT) 100 MG tablet    Sig: Take 2 tablets (200 mg total) by mouth daily.    Dispense:  60 tablet    Refill:  0   busPIRone (BUSPAR) 30 MG tablet    Sig: Take 1 tablet (30 mg total) by mouth daily.    Dispense:  30 tablet    Refill:  1

## 2021-07-31 ENCOUNTER — Encounter: Payer: Self-pay | Admitting: Medical-Surgical

## 2021-07-31 ENCOUNTER — Telehealth (INDEPENDENT_AMBULATORY_CARE_PROVIDER_SITE_OTHER): Payer: Self-pay | Admitting: Medical-Surgical

## 2021-07-31 VITALS — Ht 63.5 in | Wt 274.0 lb

## 2021-07-31 DIAGNOSIS — E038 Other specified hypothyroidism: Secondary | ICD-10-CM

## 2021-07-31 DIAGNOSIS — Z7689 Persons encountering health services in other specified circumstances: Secondary | ICD-10-CM

## 2021-07-31 DIAGNOSIS — F419 Anxiety disorder, unspecified: Secondary | ICD-10-CM

## 2021-07-31 MED ORDER — BUSPIRONE HCL 30 MG PO TABS: 30.0000 mg | ORAL_TABLET | Freq: Every day | ORAL | 1 refills | Status: DC

## 2021-07-31 MED ORDER — SERTRALINE HCL 100 MG PO TABS: 200.0000 mg | ORAL_TABLET | Freq: Every day | ORAL | 1 refills | Status: DC

## 2021-07-31 NOTE — Progress Notes (Signed)
Virtual Visit via Video Note  I connected with Michaela Holland on 07/31/21 at  9:50 AM EST by a video enabled telemedicine application and verified that I am speaking with the correct person using two identifiers.   I discussed the limitations of evaluation and management by telemedicine and the availability of in person appointments. The patient expressed understanding and agreed to proceed.  Patient location: home Provider locations: office  Subjective:    CC: Transfer of care, mood follow-up  HPI: Pleasant 40 year old female presenting today to transfer care and follow-up on mood.  Was previously seen by psychiatry however she is currently uninsured and cannot afford the financial cost of continuing specialist care.  Has been taking Zoloft 200 mg daily, tolerating well without side effects.  She is also taking BuSpar 30 mg once daily as prescribed with no side effects.  Does note that her depressive symptoms seem to be well controlled however her anxiety has been elevated.  Feels this is more circumstantial than the lack of medication control.  Previously treated with clonazepam however she and her husband are hoping for another baby and she is aware that that is a medication that she cannot take in the meantime.  Unsure if the BuSpar is working to help her anxiety or not but she has decided to continue taking it.  Denies SI/HI.  Needs a refill on her levothyroxine.  Last labs checked in 03/2020.   Past medical history, Surgical history, Family history not pertinant except as noted below, Social history, Allergies, and medications have been entered into the medical record, reviewed, and corrections made.   Review of Systems: See HPI for pertinent positives and negatives.   Objective:    General: Speaking clearly in complete sentences without any shortness of breath.  Alert and oriented x3.  Normal judgment. No apparent acute distress.  Impression and Recommendations:    1. Encounter to  establish care Reviewed available information and discussed care concerns with patient.   2. Other specified hypothyroidism Checking TSH.  Further refills pending lab results. - TSH  3. Anxiety Continue sertraline 200 mg daily and BuSpar 30 mg daily.  Refill sent to pharmacy. - sertraline (ZOLOFT) 100 MG tablet; Take 2 tablets (200 mg total) by mouth daily.  Dispense: 180 tablet; Refill: 1 - busPIRone (BUSPAR) 30 MG tablet; Take 1 tablet (30 mg total) by mouth daily.  Dispense: 90 tablet; Refill: 1  I discussed the assessment and treatment plan with the patient. The patient was provided an opportunity to ask questions and all were answered. The patient agreed with the plan and demonstrated an understanding of the instructions.   The patient was advised to call back or seek an in-person evaluation if the symptoms worsen or if the condition fails to improve as anticipated.  25 minutes of non-face-to-face time was provided during this encounter.  Return in about 6 months (around 01/28/2022) for mood follow up.  Thayer Ohm, DNP, APRN, FNP-BC [facility]

## 2021-10-02 ENCOUNTER — Encounter: Payer: Self-pay | Admitting: Medical-Surgical

## 2021-10-03 MED ORDER — LEVOTHYROXINE SODIUM 175 MCG PO TABS: 175.0000 ug | ORAL_TABLET | Freq: Every day | ORAL | 0 refills | Status: DC

## 2021-11-15 ENCOUNTER — Encounter: Payer: Self-pay | Admitting: Medical-Surgical

## 2021-11-16 MED ORDER — LEVOTHYROXINE SODIUM 175 MCG PO TABS: 175.0000 ug | ORAL_TABLET | Freq: Every day | ORAL | 0 refills | Status: DC

## 2022-02-16 ENCOUNTER — Encounter (HOSPITAL_COMMUNITY): Payer: Self-pay | Admitting: *Deleted

## 2022-03-26 ENCOUNTER — Other Ambulatory Visit: Payer: Self-pay | Admitting: Medical-Surgical

## 2022-03-26 DIAGNOSIS — F419 Anxiety disorder, unspecified: Secondary | ICD-10-CM

## 2022-05-01 ENCOUNTER — Other Ambulatory Visit: Payer: Self-pay | Admitting: Medical-Surgical

## 2022-05-01 DIAGNOSIS — F419 Anxiety disorder, unspecified: Secondary | ICD-10-CM

## 2022-05-03 ENCOUNTER — Other Ambulatory Visit: Payer: Self-pay | Admitting: Medical-Surgical

## 2022-05-03 DIAGNOSIS — F419 Anxiety disorder, unspecified: Secondary | ICD-10-CM

## 2022-05-03 NOTE — Telephone Encounter (Signed)
Patient no longer has [facility]. Patient has a different provider. Removed Joy as PCP. AMUCK

## 2022-05-03 NOTE — Telephone Encounter (Signed)
Patient needs appointment for further refills.  Last video visit 07/31/2021  Last filled 03/28/2022

## 2022-06-19 DIAGNOSIS — O09529 Supervision of elderly multigravida, unspecified trimester: Secondary | ICD-10-CM | POA: Insufficient documentation

## 2022-07-09 DIAGNOSIS — E611 Iron deficiency: Secondary | ICD-10-CM | POA: Insufficient documentation

## 2023-02-19 ENCOUNTER — Encounter (HOSPITAL_COMMUNITY): Payer: Self-pay | Admitting: *Deleted

## 2023-12-02 ENCOUNTER — Ambulatory Visit: Payer: Self-pay | Admitting: Family Medicine

## 2024-01-06 ENCOUNTER — Telehealth: Payer: Self-pay | Admitting: Medical-Surgical

## 2024-01-06 NOTE — Telephone Encounter (Signed)
 Pt wants to re-establish care with you. Please advice.

## 2024-01-07 ENCOUNTER — Ambulatory Visit (INDEPENDENT_AMBULATORY_CARE_PROVIDER_SITE_OTHER): Payer: Self-pay | Admitting: Family Medicine

## 2024-01-07 ENCOUNTER — Encounter: Payer: Self-pay | Admitting: Family Medicine

## 2024-01-07 VITALS — BP 137/88 | HR 83 | Temp 98.3°F | Resp 16 | Ht 64.0 in | Wt 300.6 lb

## 2024-01-07 DIAGNOSIS — Z7689 Persons encountering health services in other specified circumstances: Secondary | ICD-10-CM | POA: Diagnosis not present

## 2024-01-07 DIAGNOSIS — Z91018 Allergy to other foods: Secondary | ICD-10-CM | POA: Diagnosis not present

## 2024-01-07 DIAGNOSIS — T7491XD Unspecified adult maltreatment, confirmed, subsequent encounter: Secondary | ICD-10-CM | POA: Diagnosis not present

## 2024-01-07 DIAGNOSIS — T7491XS Unspecified adult maltreatment, confirmed, sequela: Secondary | ICD-10-CM

## 2024-01-07 DIAGNOSIS — T7491XA Unspecified adult maltreatment, confirmed, initial encounter: Secondary | ICD-10-CM | POA: Insufficient documentation

## 2024-01-07 DIAGNOSIS — Z1329 Encounter for screening for other suspected endocrine disorder: Secondary | ICD-10-CM | POA: Insufficient documentation

## 2024-01-07 MED ORDER — EPINEPHRINE 0.3 MG/0.3ML IJ SOAJ: 0.3000 mg | Freq: Every day | INTRAMUSCULAR | 1 refills | Status: AC | PRN
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
## 2024-01-07 NOTE — Assessment & Plan Note (Signed)
 Epi pen expired. Refill sent.

## 2024-01-15 ENCOUNTER — Encounter: Payer: Self-pay | Admitting: Family Medicine

## 2024-01-15 ENCOUNTER — Other Ambulatory Visit: Payer: Self-pay | Admitting: Family Medicine

## 2024-01-15 ENCOUNTER — Ambulatory Visit (INDEPENDENT_AMBULATORY_CARE_PROVIDER_SITE_OTHER): Payer: Self-pay | Admitting: Family Medicine

## 2024-01-15 ENCOUNTER — Telehealth (HOSPITAL_COMMUNITY): Payer: Self-pay | Admitting: Registered Nurse

## 2024-01-15 VITALS — BP 118/78 | HR 74 | Temp 97.6°F | Resp 18 | Ht 64.0 in | Wt 300.2 lb

## 2024-01-15 DIAGNOSIS — Z8639 Personal history of other endocrine, nutritional and metabolic disease: Secondary | ICD-10-CM | POA: Insufficient documentation

## 2024-01-15 DIAGNOSIS — B372 Candidiasis of skin and nail: Secondary | ICD-10-CM | POA: Diagnosis not present

## 2024-01-15 DIAGNOSIS — Z124 Encounter for screening for malignant neoplasm of cervix: Secondary | ICD-10-CM | POA: Insufficient documentation

## 2024-01-15 DIAGNOSIS — M62838 Other muscle spasm: Secondary | ICD-10-CM | POA: Insufficient documentation

## 2024-01-15 DIAGNOSIS — Z0000 Encounter for general adult medical examination without abnormal findings: Secondary | ICD-10-CM | POA: Diagnosis not present

## 2024-01-15 DIAGNOSIS — Z1329 Encounter for screening for other suspected endocrine disorder: Secondary | ICD-10-CM

## 2024-01-15 DIAGNOSIS — Z13228 Encounter for screening for other metabolic disorders: Secondary | ICD-10-CM

## 2024-01-15 DIAGNOSIS — E538 Deficiency of other specified B group vitamins: Secondary | ICD-10-CM | POA: Insufficient documentation

## 2024-01-15 DIAGNOSIS — Z136 Encounter for screening for cardiovascular disorders: Secondary | ICD-10-CM

## 2024-01-15 DIAGNOSIS — Z13220 Encounter for screening for lipoid disorders: Secondary | ICD-10-CM

## 2024-01-15 DIAGNOSIS — M545 Low back pain, unspecified: Secondary | ICD-10-CM | POA: Insufficient documentation

## 2024-01-15 MED ORDER — NYSTATIN 100000 UNIT/GM EX POWD: Freq: Four times a day (QID) | CUTANEOUS | 1 refills | Status: AC

## 2024-01-15 MED ORDER — CYCLOBENZAPRINE HCL 10 MG PO TABS: 10.0000 mg | ORAL_TABLET | Freq: Three times a day (TID) | ORAL | 2 refills | Status: AC | PRN

## 2024-01-15 NOTE — Progress Notes (Addendum)
 Complete physical exam  Patient: Michaela Holland   DOB: [birth year]   43 y.o. Female  MRN: [id number]  Subjective:    Chief Complaint  Patient presents with  . Annual Exam    With pap    Michaela Holland is a 43 y.o. female who presents today for a complete physical exam. She reports consuming a general diet.  Walking and stretching   She generally feels fairly well. She reports sleeping fairly well. She does not have additional problems to discuss today.    Most recent fall risk assessment:    01/07/2024    2:28 PM  Fall Risk   Falls in the past year? 0  Number falls in past yr: 0  Injury with Fall? 0  Risk for fall due to : No Fall Risks  Follow up Falls evaluation completed     Most recent depression screenings:    01/07/2024    2:38 PM 07/31/2021    9:53 AM  PHQ 2/9 Scores  PHQ - 2 Score 4 2  PHQ- 9 Score 15 12  Has an appointment today at 1:00  Vision:Not within last year  and Dental: No current dental problems and No regular dental care     Patient Care Team: Mickiel Albany, FNP as PCP - General (Family Medicine)   Outpatient Medications Prior to Visit  Medication Sig  . albuterol  (VENTOLIN  HFA) 108 (90 Base) MCG/ACT inhaler Inhale 1-2 puffs into the lungs every 4 (four) hours as needed for wheezing or shortness of breath (bronchospasm).  . busPIRone  (BUSPAR ) 15 MG tablet Take 15 mg by mouth 2 (two) times daily.  . cetirizine  (ZYRTEC ) 10 MG tablet Take 1 tablet (10 mg total) by mouth 2 (two) times daily.  . Diclofenac  Sodium (PENNSAID ) 2 % SOLN Place 1 application onto the skin 2 (two) times daily.  . EPINEPHrine  0.3 mg/0.3 mL IJ SOAJ injection Inject 0.3 mg into the muscle daily as needed (for anaphylactic reactions.).  Aaron Aas ferrous sulfate 325 (65 FE) MG tablet Take 1 tablet by mouth daily.  . gabapentin  (NEURONTIN ) 300 MG capsule Take 1 capsule (300 mg total) by mouth 3 (three) times daily.  Aaron Aas ibuprofen (ADVIL) 200 MG tablet Take 200 mg by mouth as needed.  Aaron Aas  ipratropium (ATROVENT ) 0.03 % nasal spray PLACE 2 SPRAYS INTO BOTH NOSTRILS EVERY 12 (TWELVE) HOURS.  . levothyroxine  (SYNTHROID ) 175 MCG tablet Take 1 tablet (175 mcg total) by mouth daily before breakfast.  . meloxicam  (MOBIC ) 7.5 MG tablet Take 1 tablet (7.5 mg total) by mouth daily.  . metFORMIN  (GLUCOPHAGE -XR) 750 MG 24 hr tablet Take 750 mg by mouth daily with breakfast.  . Multiple Vitamins-Minerals (BARIATRIC FUSION) CHEW Chew 1 tablet by mouth 2 (two) times daily.  . Probiotic Product (ACIDOPHILUS/GOAT MILK) CAPS Take 1 tablet by mouth daily as needed.  . sertraline  (ZOLOFT ) 100 MG tablet Take 2 tablets (200 mg total) by mouth daily. Needs appointment.  . triamcinolone  (NASACORT ) 55 MCG/ACT AERO nasal inhaler Place 1 spray into the nose daily. I spray in each nostril  . Turmeric 500 MG CAPS Take 1,000 mg by mouth at bedtime.  . [DISCONTINUED] cyclobenzaprine  (FLEXERIL ) 10 MG tablet Take 1 tablet (10 mg total) by mouth 3 (three) times daily as needed for muscle spasms. WILL CAUSE DROWSINESS.  . [DISCONTINUED] nystatin  (MYCOSTATIN /NYSTOP ) powder Apply topically 4 (four) times daily. As needed for fungal skin infection   No facility-administered medications prior to visit.    ROS  Objective:     BP 118/78 (BP Location: Right Arm, Patient Position: Sitting, Cuff Size: Large)   Pulse 74   Temp 97.6 F (36.4 C) (Oral)   Resp 18   Ht 5\' 4"  (1.626 m)   Wt (!) 300 lb 4 oz (136.2 kg)   LMP  (LMP Unknown)   SpO2 100%   BMI 51.54 kg/m  BP Readings from Last 3 Encounters:  01/15/24 118/78  01/07/24 137/88  10/04/20 128/88      Physical Exam Vitals and nursing note reviewed. Exam conducted with a chaperone present.  Constitutional:      General: She is not in acute distress.    Appearance: Normal appearance.  HENT:     Right Ear: Tympanic membrane normal.     Left Ear: Tympanic membrane normal.     Nose: Nose normal.     Mouth/Throat:     Mouth: Mucous membranes  are moist.     Pharynx: Oropharynx is clear.  Eyes:     Extraocular Movements: Extraocular movements intact.  Neck:     Thyroid : No thyroid  tenderness.  Cardiovascular:     Rate and Rhythm: Normal rate and regular rhythm.     Pulses:          Radial pulses are 2+ on the right side and 2+ on the left side.     Heart sounds: Normal heart sounds, S1 normal and S2 normal.  Pulmonary:     Effort: Pulmonary effort is normal.     Breath sounds: Normal breath sounds.  Chest:  Breasts:    Right: Normal. No bleeding, inverted nipple, nipple discharge or tenderness.     Left: Normal. No bleeding, inverted nipple, nipple discharge or tenderness.  Abdominal:     General: Bowel sounds are normal.     Palpations: Abdomen is soft.     Tenderness: There is no abdominal tenderness.  Genitourinary:    General: Normal vulva.     Exam position: Lithotomy position.     Vagina: Normal.     Cervix: Normal.     Adnexa: Right adnexa normal and left adnexa normal.  Musculoskeletal:        General: Normal range of motion.     Cervical back: Normal range of motion.     Right lower leg: No edema.     Left lower leg: No edema.  Lymphadenopathy:     Cervical:     Right cervical: No superficial cervical adenopathy.    Left cervical: No superficial cervical adenopathy.     Upper Body:     Right upper body: No supraclavicular or axillary adenopathy.     Left upper body: No supraclavicular or axillary adenopathy.  Skin:    General: Skin is warm and dry.  Neurological:     General: No focal deficit present.     Mental Status: She is alert. Mental status is at baseline.  Psychiatric:        Mood and Affect: Mood normal.        Behavior: Behavior normal.        Thought Content: Thought content normal.        Judgment: Judgment normal.     No results found for any visits on 01/15/24.     Assessment & Plan:    Routine Health Maintenance and Physical Exam  Immunization History  Administered Date(s)  Administered  . Influenza Split 06/10/2013, 06/15/2014, 08/15/2015  . Influenza,inj,Quad PF,6+ Mos 07/08/2015, 06/03/2018, 06/15/2019, 08/24/2020  .  Influenza-Unspecified 06/15/2014, 08/15/2015, 05/11/2017  . PFIZER(Purple Top)SARS-COV-2 Vaccination 12/14/2019, 01/08/2020, 08/11/2020  . Td 11/09/2011  . Tdap 11/09/2011, 03/04/2015    Health Maintenance  Topic Date Due  . Cervical Cancer Screening (HPV/Pap Cotest)  01/10/2023  . COVID-19 Vaccine (4 - 2024-25 season) 05/12/2023  . Hepatitis C Screening  01/14/2025 (Originally 05/26/1999)  . HIV Screening  01/14/2025 (Originally 05/25/1996)  . INFLUENZA VACCINE  04/10/2024  . DTaP/Tdap/Td (4 - Td or Tdap) 03/03/2025  . HPV VACCINES  Aged Out  . Meningococcal B Vaccine  Aged Out    Discussed health benefits of physical activity, and encouraged her to engage in regular exercise appropriate for her age and condition.  Annual physical exam -     CBC -     Comprehensive metabolic panel with GFR  Screening for cervical cancer -     IGP, Aptima HPV  Encounter for lipid screening for cardiovascular disease -     Lipid panel  Encounter for screening for metabolic disorder -     Comprehensive metabolic panel with GFR -     Hemoglobin A1c  Screening for thyroid  disorder -     TSH + free T4  History of iron deficiency -     Iron, TIBC and Ferritin Panel  Skin yeast infection -     Nystatin ; Apply topically 4 (four) times daily. As needed for fungal skin infection  Dispense: 45 g; Refill: 1  Muscle spasm -     Cyclobenzaprine  HCl; Take 1 tablet (10 mg total) by mouth 3 (three) times daily as needed for muscle spasms. WILL CAUSE DROWSINESS.  Dispense: 60 tablet; Refill: 2      Routine labs ordered.  HCM reviewed/discussed. Declines Hep C and HIV. Pap smear done today.  Anticipatory guidance regarding healthy weight, lifestyle and choices given. Recommend healthy diet.  Recommend approximately 150 minutes/week of moderate intensity  exercise. Resistance training is good for building muscles and for bone health. Muscle mass helps to increase our metabolism and to burn more calories at rest.  Limit alcohol consumption: no more than one drink per day for women and 2 drinks per day for me. Recommend regular dental and vision exams. Always use seatbelt/lap and shoulder restraints. Recommend using smoke alarms and checking batteries at least twice a year. Recommend using sunscreen when outside.  Please know that I am here to help you with all of your health care goals and am happy to work with you to find a solution that works best for you.  The greatest advice I have received with any changes in life are to take it one step at a time, that even means if all you can focus on is the next 60 seconds, then do that and celebrate your victories.  With any changes in life, you will have set backs, and that is OK. The important thing to remember is, if you have a set back, it is not a failure, it is an opportunity to try again! Agrees with plan of care discussed.  Questions answered.      Return in about 1 year (around 01/15/2025) for CPE with labs.     Mickiel Albany, FNP

## 2024-01-16 ENCOUNTER — Encounter: Payer: Self-pay | Admitting: Family Medicine

## 2024-01-16 LAB — LIPID PANEL
Chol/HDL Ratio: 4 ratio (ref 0.0–4.4)
Cholesterol, Total: 237 mg/dL — ABNORMAL HIGH (ref 100–199)
HDL: 60 mg/dL (ref >39)
LDL Chol Calc (NIH): 148 mg/dL — ABNORMAL HIGH (ref 0–99)
Triglycerides: 164 mg/dL — ABNORMAL HIGH (ref 0–149)
VLDL Cholesterol Cal: 29 mg/dL (ref 5–40)

## 2024-01-16 LAB — CBC
Hematocrit: 41.4 % (ref 34.0–46.6)
Hemoglobin: 13.5 g/dL (ref 11.1–15.9)
MCH: 28.3 pg (ref 26.6–33.0)
MCHC: 32.6 g/dL (ref 31.5–35.7)
MCV: 87 fL (ref 79–97)
Platelets: 328 10*3/uL (ref 150–450)
RBC: 4.77 x10E6/uL (ref 3.77–5.28)
RDW: 12.3 % (ref 11.7–15.4)
WBC: 6.3 10*3/uL (ref 3.4–10.8)

## 2024-01-16 LAB — HEMOGLOBIN A1C
Est. average glucose Bld gHb Est-mCnc: 97 mg/dL
Hgb A1c MFr Bld: 5 % (ref 4.8–5.6)

## 2024-01-16 LAB — COMPREHENSIVE METABOLIC PANEL WITH GFR
ALT: 11 IU/L (ref 0–32)
AST: 16 IU/L (ref 0–40)
Albumin: 4.4 g/dL (ref 3.9–4.9)
Alkaline Phosphatase: 67 IU/L (ref 44–121)
BUN/Creatinine Ratio: 11 (ref 9–23)
BUN: 8 mg/dL (ref 6–24)
Bilirubin Total: 0.5 mg/dL (ref 0.0–1.2)
CO2: 25 mmol/L (ref 20–29)
Calcium: 9.3 mg/dL (ref 8.7–10.2)
Chloride: 100 mmol/L (ref 96–106)
Creatinine, Ser: 0.7 mg/dL (ref 0.57–1.00)
Globulin, Total: 2.4 g/dL (ref 1.5–4.5)
Glucose: 86 mg/dL (ref 70–99)
Potassium: 4.9 mmol/L (ref 3.5–5.2)
Sodium: 139 mmol/L (ref 134–144)
Total Protein: 6.8 g/dL (ref 6.0–8.5)
eGFR: 111 mL/min/{1.73_m2} (ref >59)

## 2024-01-16 LAB — IRON,TIBC AND FERRITIN PANEL
Ferritin: 43 ng/mL (ref 15–150)
Iron Saturation: 23 % (ref 15–55)
Iron: 77 ug/dL (ref 27–159)
Total Iron Binding Capacity: 341 ug/dL (ref 250–450)
UIBC: 264 ug/dL (ref 131–425)

## 2024-01-16 LAB — TSH+FREE T4
Free T4: 1.31 ng/dL (ref 0.82–1.77)
TSH: 3 u[IU]/mL (ref 0.450–4.500)

## 2024-01-17 LAB — B12 AND FOLATE PANEL
Folate: 4.8 ng/mL (ref >3.0)
Vitamin B-12: 259 pg/mL (ref 232–1245)

## 2024-01-17 LAB — SPECIMEN STATUS REPORT

## 2024-01-17 LAB — VITAMIN D 25 HYDROXY (VIT D DEFICIENCY, FRACTURES): Vit D, 25-Hydroxy: 20.4 ng/mL — ABNORMAL LOW (ref 30.0–100.0)

## 2024-01-24 ENCOUNTER — Ambulatory Visit: Payer: Self-pay | Admitting: Family Medicine

## 2024-01-24 LAB — IGP, APTIMA HPV
HPV Aptima: NEGATIVE
PAP Smear Comment: 0

## 2024-01-29 ENCOUNTER — Encounter (HOSPITAL_COMMUNITY): Payer: Self-pay | Admitting: Registered Nurse

## 2024-01-29 ENCOUNTER — Ambulatory Visit (INDEPENDENT_AMBULATORY_CARE_PROVIDER_SITE_OTHER): Payer: Self-pay | Admitting: Registered Nurse

## 2024-01-29 ENCOUNTER — Telehealth (HOSPITAL_COMMUNITY): Payer: Self-pay | Admitting: Registered Nurse

## 2024-01-29 DIAGNOSIS — F331 Major depressive disorder, recurrent, moderate: Secondary | ICD-10-CM | POA: Diagnosis not present

## 2024-01-29 DIAGNOSIS — F411 Generalized anxiety disorder: Secondary | ICD-10-CM

## 2024-01-29 MED ORDER — SERTRALINE HCL 100 MG PO TABS
200.0000 mg | ORAL_TABLET | Freq: Every day | ORAL | 0 refills | Status: DC
Start: 2024-01-29 — End: 2024-08-05

## 2024-01-29 MED ORDER — ARIPIPRAZOLE 2 MG PO TABS: 2.0000 mg | ORAL_TABLET | Freq: Every day | ORAL | 0 refills | Status: DC

## 2024-01-29 MED ORDER — BUSPIRONE HCL 15 MG PO TABS: 15.0000 mg | ORAL_TABLET | Freq: Two times a day (BID) | ORAL | 0 refills | Status: DC

## 2024-01-29 NOTE — Telephone Encounter (Signed)
 Received notification from RTE when checking in patient for virtual appointment today that insurance is out of network for this provider/clinic. Verified this with BC/BS Michaela Holland. Ref# [id number]). Left a voicemail for patient informing her of insurance status and that she could keep the appointment if she chooses and would be responsible for the associated charges. Requested call back to confirm her choice.
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
## 2024-01-30 ENCOUNTER — Other Ambulatory Visit: Payer: Self-pay | Admitting: Family Medicine

## 2024-01-30 DIAGNOSIS — E038 Other specified hypothyroidism: Secondary | ICD-10-CM

## 2024-01-30 MED ORDER — LEVOTHYROXINE SODIUM 175 MCG PO TABS
175.0000 ug | ORAL_TABLET | Freq: Every day | ORAL | 0 refills | Status: DC
Start: 2024-01-30 — End: 2024-04-26

## 2024-02-05 ENCOUNTER — Encounter: Payer: Self-pay | Admitting: Family Medicine

## 2024-02-07 ENCOUNTER — Encounter: Payer: Self-pay | Admitting: Family Medicine

## 2024-02-11 ENCOUNTER — Telehealth (HOSPITAL_COMMUNITY): Admitting: Registered Nurse

## 2024-02-11 ENCOUNTER — Encounter (HOSPITAL_COMMUNITY): Payer: Self-pay | Admitting: Registered Nurse

## 2024-02-11 DIAGNOSIS — F411 Generalized anxiety disorder: Secondary | ICD-10-CM

## 2024-02-11 DIAGNOSIS — F331 Major depressive disorder, recurrent, moderate: Secondary | ICD-10-CM

## 2024-02-11 MED ORDER — BUSPIRONE HCL 15 MG PO TABS
15.0000 mg | ORAL_TABLET | Freq: Two times a day (BID) | ORAL | 1 refills | Status: AC
Start: 2024-02-11 — End: ?

## 2024-02-11 MED ORDER — ARIPIPRAZOLE 5 MG PO TABS: 5.0000 mg | ORAL_TABLET | Freq: Every day | ORAL | 1 refills | Status: AC

## 2024-02-11 MED ORDER — HYDROXYZINE HCL 10 MG PO TABS: ORAL_TABLET | ORAL | 0 refills | Status: AC
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
## 2024-02-11 NOTE — Patient Instructions (Signed)

## 2024-02-26 ENCOUNTER — Ambulatory Visit (HOSPITAL_COMMUNITY): Admitting: Licensed Clinical Social Worker

## 2024-04-24 ENCOUNTER — Encounter: Payer: Self-pay | Admitting: Family Medicine

## 2024-04-25 ENCOUNTER — Other Ambulatory Visit: Payer: Self-pay | Admitting: Family Medicine

## 2024-04-25 DIAGNOSIS — E038 Other specified hypothyroidism: Secondary | ICD-10-CM

## 2024-07-30 ENCOUNTER — Other Ambulatory Visit: Payer: Self-pay | Admitting: Family Medicine

## 2024-07-30 DIAGNOSIS — E038 Other specified hypothyroidism: Secondary | ICD-10-CM

## 2024-08-05 ENCOUNTER — Other Ambulatory Visit: Payer: Self-pay | Admitting: Family Medicine

## 2024-08-05 ENCOUNTER — Encounter: Payer: Self-pay | Admitting: Family Medicine

## 2024-08-05 DIAGNOSIS — F331 Major depressive disorder, recurrent, moderate: Secondary | ICD-10-CM

## 2024-08-05 DIAGNOSIS — F411 Generalized anxiety disorder: Secondary | ICD-10-CM

## 2024-08-05 MED ORDER — SERTRALINE HCL 100 MG PO TABS: 200.0000 mg | ORAL_TABLET | Freq: Every day | ORAL | 0 refills | Status: AC

## 2024-08-05 NOTE — Telephone Encounter (Signed)
 Copied from CRM #[id number]. Topic: Clinical - Medication Refill >> Aug 05, 2024 11:26 AM Kevelyn M wrote: Medication: sertraline  (ZOLOFT ) 100 MG tablet requesting 90 day but would be ok with at 30 day refill  Has the patient contacted their pharmacy? No refills left (Agent: If no, request that the patient contact the pharmacy for the refill. If patient does not wish to contact the pharmacy document the reason why and proceed with request.) (Agent: If yes, when and what did the pharmacy advise?)  This is the patient's preferred pharmacy:  [facility] [address] [place], [place] [address] [phone]   Is this the correct pharmacy for this prescription? Yes If no, delete pharmacy and type the correct one.   Has the prescription been filled recently? Yes  Is the patient out of the medication? No  Has the patient been seen for an appointment in the last year OR does the patient have an upcoming appointment? Yes  Can we respond through MyChart? Yes  Agent: Please be advised that Rx refills may take up to 3 business days. We ask that you follow-up with your pharmacy.

## 2024-08-05 NOTE — Telephone Encounter (Signed)
REQUESTS 90 DAY SUPPLY

## 2024-09-22 ENCOUNTER — Ambulatory Visit: Payer: Self-pay | Admitting: Family Medicine

## 2024-09-22 NOTE — Telephone Encounter (Signed)
 FYI Only or Action Required?: Action required by provider: medication refill request.  Patient was last seen in primary care on 01/15/2024 by Michaela Darice SAUNDERS, FNP.  Called Nurse Triage reporting Medication Refill.    Triage Disposition: Call PCP When Office is Open  Patient/caregiver understands and will follow disposition?: Yes               Copied from CRM [id number]. Topic: Clinical - Red Word Triage >> Sep 22, 2024 10:20 AM Hadassah PARAS wrote: Red Word that prompted transfer to Nurse Triage: Pt called in to req PCP to have Lamotrigine 200MG  prescribed as she wont be seeing her mental health provider in some time. Pt is afraid to go through withdrawels. Pt stated she has been experiencing worsening mood irritability. Transferred to NT Reason for Disposition  [1] Prescription refill request for NON-ESSENTIAL medicine (i.e., no harm to patient if med not taken) AND [2] triager unable to refill per department policy  Answer Assessment - Initial Assessment Questions Asking if PCP Darice can write her a prescription to cover her for a week. Last dose of medication was yesterday and she doesn't get in to see her new psych provider til Monday, MD Live Dr Michaela Holland   1. DRUG NAME: What medicine do you need to have refilled?     Lamotrigine 200mg  once daily. Has been on the medication since July for depression and anxiety.  2. REFILLS REMAINING: How many refills are remaining? Notes: The label on the medicine or pill bottle will show how many refills are remaining. If there are no refills remaining, then a renewal may be needed.     0.  3. EXPIRATION DATE: What is the expiration date? Note: The label states when the prescription will expire, and thus can no longer be refilled.)     N/A.  4. PRESCRIBER: Who prescribed it? Note: The prescribing doctor or group is responsible for refill approvals..     Dr Marsa Reus  5. PHARMACY: Have you contacted your pharmacy  (drugstore)? Note: Some pharmacies will contact the doctor (or NP/PA).      N/A.  6. SYMPTOMS: Do you have any symptoms?     No. Denies suicidal thoughts or plans. Doesn't want to come off the med and feel bad.  Protocols used: Medication Refill and Renewal Call-A-AH
